# Patient Record
Sex: Female | Born: 1960 | Race: White | Hispanic: No | Smoking: Former smoker
Health system: Southern US, Community
[De-identification: ages and names within clinical notes are randomized; demographics above are authoritative.]

## PROBLEM LIST (undated history)

## (undated) DIAGNOSIS — T883XXA Malignant hyperthermia due to anesthesia, initial encounter: Secondary | ICD-10-CM

## (undated) DIAGNOSIS — Z803 Family history of malignant neoplasm of breast: Secondary | ICD-10-CM

## (undated) DIAGNOSIS — G7100 Muscular dystrophy, unspecified: Secondary | ICD-10-CM

## (undated) DIAGNOSIS — Z86718 Personal history of other venous thrombosis and embolism: Secondary | ICD-10-CM

## (undated) DIAGNOSIS — M1990 Unspecified osteoarthritis, unspecified site: Secondary | ICD-10-CM

## (undated) DIAGNOSIS — R918 Other nonspecific abnormal finding of lung field: Secondary | ICD-10-CM

## (undated) DIAGNOSIS — K219 Gastro-esophageal reflux disease without esophagitis: Secondary | ICD-10-CM

## (undated) DIAGNOSIS — F419 Anxiety disorder, unspecified: Secondary | ICD-10-CM

## (undated) DIAGNOSIS — N189 Chronic kidney disease, unspecified: Secondary | ICD-10-CM

## (undated) DIAGNOSIS — C3490 Malignant neoplasm of unspecified part of unspecified bronchus or lung: Secondary | ICD-10-CM

## (undated) HISTORY — PX: LITHOTRIPSY: SUR834

## (undated) HISTORY — DX: Malignant hyperthermia due to anesthesia, initial encounter: T88.3XXA

## (undated) HISTORY — DX: Gastro-esophageal reflux disease without esophagitis: K21.9

## (undated) HISTORY — DX: Muscular dystrophy, unspecified: G71.00

## (undated) HISTORY — DX: Anxiety disorder, unspecified: F41.9

## (undated) HISTORY — DX: Personal history of other venous thrombosis and embolism: Z86.718

## (undated) HISTORY — DX: Other nonspecific abnormal finding of lung field: R91.8

## (undated) HISTORY — DX: Unspecified osteoarthritis, unspecified site: M19.90

## (undated) HISTORY — DX: Family history of malignant neoplasm of breast: Z80.3

## (undated) HISTORY — DX: Chronic kidney disease, unspecified: N18.9

## (undated) HISTORY — DX: Malignant neoplasm of unspecified part of unspecified bronchus or lung: C34.90

---

## 1986-03-06 HISTORY — PX: TUBAL LIGATION: SHX77

## 2003-12-10 ENCOUNTER — Other Ambulatory Visit: Payer: Self-pay

## 2004-10-08 ENCOUNTER — Ambulatory Visit: Payer: Self-pay | Admitting: Unknown Physician Specialty

## 2005-08-25 ENCOUNTER — Ambulatory Visit: Payer: Self-pay | Admitting: Unknown Physician Specialty

## 2006-03-23 ENCOUNTER — Ambulatory Visit: Payer: Self-pay | Admitting: Unknown Physician Specialty

## 2006-04-01 ENCOUNTER — Inpatient Hospital Stay: Payer: Self-pay | Admitting: Unknown Physician Specialty

## 2006-04-15 ENCOUNTER — Emergency Department: Payer: Self-pay | Admitting: General Practice

## 2006-06-13 ENCOUNTER — Ambulatory Visit: Payer: Self-pay | Admitting: Unknown Physician Specialty

## 2007-02-22 ENCOUNTER — Ambulatory Visit: Payer: Self-pay | Admitting: Unknown Physician Specialty

## 2007-03-01 ENCOUNTER — Ambulatory Visit: Payer: Self-pay | Admitting: Internal Medicine

## 2007-04-16 ENCOUNTER — Emergency Department: Payer: Self-pay | Admitting: Emergency Medicine

## 2008-05-15 ENCOUNTER — Ambulatory Visit: Payer: Self-pay | Admitting: Unknown Physician Specialty

## 2009-01-02 ENCOUNTER — Emergency Department: Payer: Self-pay | Admitting: Emergency Medicine

## 2009-06-16 ENCOUNTER — Ambulatory Visit: Payer: Self-pay | Admitting: Unknown Physician Specialty

## 2009-07-30 ENCOUNTER — Ambulatory Visit: Payer: Self-pay | Admitting: Unknown Physician Specialty

## 2009-08-26 ENCOUNTER — Ambulatory Visit: Payer: Self-pay | Admitting: Unknown Physician Specialty

## 2009-09-02 ENCOUNTER — Ambulatory Visit: Payer: Self-pay | Admitting: Unknown Physician Specialty

## 2009-12-26 ENCOUNTER — Ambulatory Visit: Payer: Self-pay | Admitting: Unknown Physician Specialty

## 2010-01-02 ENCOUNTER — Ambulatory Visit: Payer: Self-pay | Admitting: Unknown Physician Specialty

## 2010-09-14 ENCOUNTER — Ambulatory Visit: Payer: Self-pay | Admitting: Unknown Physician Specialty

## 2010-10-12 ENCOUNTER — Ambulatory Visit: Payer: Self-pay | Admitting: Unknown Physician Specialty

## 2011-05-14 ENCOUNTER — Other Ambulatory Visit (INDEPENDENT_AMBULATORY_CARE_PROVIDER_SITE_OTHER): Payer: Self-pay | Admitting: Surgery

## 2011-05-14 DIAGNOSIS — E042 Nontoxic multinodular goiter: Secondary | ICD-10-CM

## 2011-06-16 ENCOUNTER — Other Ambulatory Visit: Payer: Self-pay

## 2011-06-23 ENCOUNTER — Other Ambulatory Visit: Payer: Self-pay

## 2011-06-30 ENCOUNTER — Ambulatory Visit
Admission: RE | Admit: 2011-06-30 | Discharge: 2011-06-30 | Disposition: A | Discharge status: Home / Self Care | Payer: Medicaid Other | Source: Ambulatory Visit | Attending: Surgery | Admitting: Surgery

## 2011-06-30 DIAGNOSIS — E042 Nontoxic multinodular goiter: Secondary | ICD-10-CM

## 2011-07-02 ENCOUNTER — Encounter (INDEPENDENT_AMBULATORY_CARE_PROVIDER_SITE_OTHER): Payer: Self-pay | Admitting: Surgery

## 2011-07-05 ENCOUNTER — Ambulatory Visit (INDEPENDENT_AMBULATORY_CARE_PROVIDER_SITE_OTHER): Payer: Medicare Other | Admitting: Surgery

## 2011-07-05 ENCOUNTER — Encounter (INDEPENDENT_AMBULATORY_CARE_PROVIDER_SITE_OTHER): Payer: Self-pay | Admitting: Surgery

## 2011-07-05 VITALS — BP 132/76 | HR 68 | Temp 97.1°F | Ht 62.5 in | Wt 125.4 lb

## 2011-07-05 DIAGNOSIS — E042 Nontoxic multinodular goiter: Secondary | ICD-10-CM | POA: Insufficient documentation

## 2011-07-05 DIAGNOSIS — E041 Nontoxic single thyroid nodule: Secondary | ICD-10-CM

## 2011-07-05 NOTE — Progress Notes (Signed)
HISTORY: Patient is a 50 year old female followed in my practice with bilateral thyroid nodules. She was last evaluated in December 2011. She is not on thyroid hormone suppression. She has had no previous biopsies. She is followed by her primary physician.  Thyroid ultrasound was performed at [facility] imaging on July 25. This study demonstrated stable bilateral thyroid nodules. The largest nodules measured only 13 mm in diameter. No nodules met criteria for biopsy.  Patient returns today for physical examination and review of ultrasound results.   PERTINENT REVIEW OF SYSTEMS: Patient denies any compressive symptoms. She denies tremor. She denies palpitation.   EXAM: HEENT shows her to be normocephalic. Sclerae clear. Pupils equal and reactive. Dentition good. Mucous membranes moist. Voice normal. Neck is symmetric with extension. Palpation reveals a nodular thyroid gland. The largest nodule on the right measures approximately 1 cm. No tenderness. No anterior or posterior cervical lymphadenopathy. No supraclavicular masses. Chest is clear to auscultation without rales rhonchi or wheeze Cardiac exam reveals regular rate and rhythm without significant murmur Extremities are nontender without edema. No tenderness. Neurologic exam shows her to be alert and oriented without obvious focal deficit. No sign of tremor.   IMPRESSION: Multinodular thyroid gland, largest nodule 13 mm, stable.   PLAN: The patient will continue observation. At this point she does not require thyroid hormone suppression or supplementation. I will see her back in one year. We will obtained a thyroid ultrasound and a TSH level prior to that office visit.

## 2012-03-29 ENCOUNTER — Ambulatory Visit: Payer: Self-pay | Admitting: Unknown Physician Specialty

## 2012-05-11 ENCOUNTER — Other Ambulatory Visit (INDEPENDENT_AMBULATORY_CARE_PROVIDER_SITE_OTHER): Payer: Self-pay

## 2012-05-11 DIAGNOSIS — E041 Nontoxic single thyroid nodule: Secondary | ICD-10-CM

## 2012-06-21 ENCOUNTER — Encounter (INDEPENDENT_AMBULATORY_CARE_PROVIDER_SITE_OTHER): Payer: Self-pay

## 2012-06-21 ENCOUNTER — Telehealth (INDEPENDENT_AMBULATORY_CARE_PROVIDER_SITE_OTHER): Payer: Self-pay

## 2012-06-21 NOTE — Telephone Encounter (Signed)
Unable to reach pt at phone # has been d/c. Reminder card mailed to pt. U/S and labs due prior to ov. Needs ov after date for tests.

## 2013-04-30 ENCOUNTER — Emergency Department: Payer: Self-pay | Admitting: Emergency Medicine

## 2013-04-30 LAB — CBC
HCT: 42.2 % (ref 35.0–47.0)
HGB: 14.5 g/dL (ref 12.0–16.0)
MCH: 32.9 pg (ref 26.0–34.0)
MCV: 96 fL (ref 80–100)
RBC: 4.4 10*6/uL (ref 3.80–5.20)
RDW: 12.9 % (ref 11.5–14.5)

## 2013-04-30 LAB — URINALYSIS, COMPLETE
Bacteria: NONE SEEN
Bilirubin,UR: NEGATIVE
Glucose,UR: NEGATIVE mg/dL (ref 0–75)
Ketone: NEGATIVE
Leukocyte Esterase: NEGATIVE
Nitrite: NEGATIVE
Ph: 5 (ref 4.5–8.0)
RBC,UR: 2 /HPF (ref 0–5)
Squamous Epithelial: 2
WBC UR: 1 /HPF (ref 0–5)

## 2013-04-30 LAB — PROTIME-INR
INR: 1.9
Prothrombin Time: 21.6 secs — ABNORMAL HIGH (ref 11.5–14.7)

## 2013-04-30 LAB — COMPREHENSIVE METABOLIC PANEL
Albumin: 3.6 g/dL (ref 3.4–5.0)
Alkaline Phosphatase: 90 U/L (ref 50–136)
Anion Gap: 4 — ABNORMAL LOW (ref 7–16)
BUN: 16 mg/dL (ref 7–18)
Calcium, Total: 8.9 mg/dL (ref 8.5–10.1)
Chloride: 105 mmol/L (ref 98–107)
Co2: 29 mmol/L (ref 21–32)
Creatinine: 0.38 mg/dL — ABNORMAL LOW (ref 0.60–1.30)
EGFR (African American): >60
EGFR (Non-African Amer.): >60
Glucose: 96 mg/dL (ref 65–99)
Potassium: 3.9 mmol/L (ref 3.5–5.1)
Sodium: 138 mmol/L (ref 136–145)

## 2013-05-06 DIAGNOSIS — R509 Fever, unspecified: Secondary | ICD-10-CM | POA: Insufficient documentation

## 2013-05-22 ENCOUNTER — Other Ambulatory Visit: Payer: Self-pay

## 2013-05-24 ENCOUNTER — Inpatient Hospital Stay: Payer: Self-pay | Admitting: Internal Medicine

## 2013-05-24 LAB — COMPREHENSIVE METABOLIC PANEL
BUN: 16 mg/dL (ref 7–18)
Bilirubin,Total: 0.4 mg/dL (ref 0.2–1.0)
Calcium, Total: 9.4 mg/dL (ref 8.5–10.1)
Chloride: 105 mmol/L (ref 98–107)
EGFR (African American): >60
EGFR (Non-African Amer.): >60
Glucose: 77 mg/dL (ref 65–99)
Potassium: 3.6 mmol/L (ref 3.5–5.1)
SGOT(AST): 20 U/L (ref 15–37)
Sodium: 139 mmol/L (ref 136–145)
Total Protein: 7.9 g/dL (ref 6.4–8.2)

## 2013-05-24 LAB — CBC WITH DIFFERENTIAL/PLATELET
Basophil #: 0 10*3/uL (ref 0.0–0.1)
Basophil %: 0.4 %
Eosinophil %: 0.4 %
HCT: 40.4 % (ref 35.0–47.0)
HGB: 13.7 g/dL (ref 12.0–16.0)
MCHC: 34 g/dL (ref 32.0–36.0)
Monocyte #: 0.6 x10 3/mm (ref 0.2–0.9)
Monocyte %: 6.6 %
Neutrophil #: 6.8 10*3/uL — ABNORMAL HIGH (ref 1.4–6.5)
Neutrophil %: 74.8 %
Platelet: 282 10*3/uL (ref 150–440)
RBC: 4.17 10*6/uL (ref 3.80–5.20)
WBC: 9.1 10*3/uL (ref 3.6–11.0)

## 2013-05-24 LAB — CK-MB: CK-MB: 1.4 ng/mL (ref 0.5–3.6)

## 2013-05-24 LAB — PROTIME-INR
INR: 2.2
Prothrombin Time: 24 secs — ABNORMAL HIGH (ref 11.5–14.7)

## 2013-05-24 LAB — TROPONIN I: Troponin-I: <0.02 ng/mL

## 2013-05-25 LAB — PROTIME-INR
INR: 2.6
Prothrombin Time: 27.3 secs — ABNORMAL HIGH (ref 11.5–14.7)

## 2013-05-25 LAB — CK-MB: CK-MB: 1.7 ng/mL (ref 0.5–3.6)

## 2013-05-25 LAB — TROPONIN I: Troponin-I: <0.02 ng/mL

## 2013-05-26 LAB — PROTIME-INR
INR: 2.5
Prothrombin Time: 26.4 secs — ABNORMAL HIGH (ref 11.5–14.7)

## 2013-05-28 LAB — CULTURE, BLOOD (SINGLE)

## 2013-05-30 LAB — CULTURE, BLOOD (SINGLE)

## 2013-05-31 ENCOUNTER — Ambulatory Visit: Payer: Self-pay

## 2013-11-07 ENCOUNTER — Emergency Department: Payer: Self-pay | Admitting: Emergency Medicine

## 2013-11-07 LAB — COMPREHENSIVE METABOLIC PANEL
Alkaline Phosphatase: 92 U/L
Anion Gap: 4 — ABNORMAL LOW (ref 7–16)
BUN: 21 mg/dL — ABNORMAL HIGH (ref 7–18)
Bilirubin,Total: 0.2 mg/dL (ref 0.2–1.0)
Calcium, Total: 9.3 mg/dL (ref 8.5–10.1)
Chloride: 107 mmol/L (ref 98–107)
Creatinine: 0.5 mg/dL — ABNORMAL LOW (ref 0.60–1.30)
EGFR (African American): >60
EGFR (Non-African Amer.): >60
Glucose: 107 mg/dL — ABNORMAL HIGH (ref 65–99)
Potassium: 4.2 mmol/L (ref 3.5–5.1)
SGOT(AST): 48 U/L — ABNORMAL HIGH (ref 15–37)

## 2013-11-07 LAB — CBC
HCT: 39.2 % (ref 35.0–47.0)
MCV: 96 fL (ref 80–100)

## 2013-11-07 LAB — URINALYSIS, COMPLETE
Bacteria: NONE SEEN
Bilirubin,UR: NEGATIVE
Ketone: NEGATIVE
Nitrite: NEGATIVE
Ph: 6 (ref 4.5–8.0)
RBC,UR: 607 /HPF (ref 0–5)
Specific Gravity: 1.023 (ref 1.003–1.030)

## 2014-05-31 DIAGNOSIS — F419 Anxiety disorder, unspecified: Secondary | ICD-10-CM | POA: Insufficient documentation

## 2014-05-31 DIAGNOSIS — F418 Other specified anxiety disorders: Secondary | ICD-10-CM | POA: Insufficient documentation

## 2014-05-31 DIAGNOSIS — K219 Gastro-esophageal reflux disease without esophagitis: Secondary | ICD-10-CM | POA: Insufficient documentation

## 2014-10-01 ENCOUNTER — Ambulatory Visit: Payer: Self-pay

## 2015-01-29 ENCOUNTER — Emergency Department: Payer: Self-pay | Admitting: Emergency Medicine

## 2015-02-24 ENCOUNTER — Ambulatory Visit: Payer: Self-pay | Admitting: Orthopedic Surgery

## 2015-03-28 NOTE — Discharge Summary (Signed)
PATIENT NAME:  Deborah Nguyen, Deborah Nguyen MR#:  [id number] DATE OF BIRTH:  1961/07/28  DATE OF ADMISSION:  05/24/2013 DATE OF DISCHARGE:  05/26/2013  DISCHARGE DIAGNOSES: 1.  Bacteremia, Staphylococcus. 2.  Fever unknown origin.   DISCHARGE MEDICATIONS: Per [facility] med reconciliation system, basically her home meds.   HISTORY AND PHYSICAL:  Please see detailed history and physical done on admission.   HOSPITAL COURSE: Admitted with fever, status post extensive workup. Culture was done that showed Staphylococcus which prompted Dr. Ola Spurr to admit her. Further workup with MRI of her C and T-spine were negative, as far as diskitis. Echocardiogram did not show evidence for endocarditis. Further blood cultures were negative and the cultures ultimately grew Staph epidermis, so it is unclear if this is a true bacteremia. Dr. Ola Spurr thought given that in that her fevers were normal, she could be discharged with the negative workup, which she has had. The patient is agreeable with that.  She agrees to see him on Monday with today being Saturday. She agrees to call with any further difficulty. Her INR is therapeutic at 2.5 this morning on her usual dose of warfarin.   TIME SPENT: It took approximately 34 minutes to do her discharge tasks today, including evaluating the patient, filling out her discharge forms, discussing with the patient, reviewing full records and Dr. Blane Ohara weekend instructions, etc.   ____________________________ Ocie Cornfield. Ouida Sills, MD mwa:ce D: 05/26/2013 10:16:04 ET T: 05/26/2013 10:59:17 ET JOB#: [id number]  cc: Ocie Cornfield. Ouida Sills, MD, <Dictator> Kirk Ruths MD ELECTRONICALLY SIGNED 05/27/2013 9:25

## 2015-03-28 NOTE — H&P (Signed)
PATIENT NAME:  Deborah Nguyen, Deborah Nguyen MR#:  [id number] DATE OF BIRTH:  10/29/1961  DATE OF ADMISSION:  05/24/2013  PRIMARY CARE PHYSICIAN: Boykin Reaper.   REASON FOR ADMISSION: Bacteremia and fever of unknown origin.   HISTORY OF PRESENT ILLNESS: This is a 54 year old relatively healthy female who was referred to infectious disease consult, seen 2 days ago for fevers of unknown origin. Blood cultures were done at that time and have been growing gram-positive cocci in clusters. She is admitted for further treatment and evaluation of this.   The patient has been ill for over a month. She first began with some abdominal pain and then a sore in her mouth, which blistered and burst. She then gradually felt weak, fatigued, tremors, sweating at night and fevers up to 103. She has been seen in urgent care and was initially treated for possible diverticulitis in early May, however, did not tolerate the antibiotics she was on, so that was stopped. She did have a CT scan of her abdomen, which showed no diverticulitis but did show some constipation. She then did restart doxycycline on June 6th for a 10 day course. She has had an extensive otherwise workup otherwise for her FUOs.   We advised the patient to come to clinic today to be admitted. She reports continued fevers, has been checking it every day, and it is up to 100.4; very tremulous and weak and feels achy all over. She also reports some neck pain on the middle of her C-spine.   PAST MEDICAL HISTORY: 1.  Muscular dystrophy.  2.  Hepatitis A.  3.  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  4.  Thyroid nodules.  5.  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  6.  C-section.  7.  Lithotripsy.  8.  Positive ANA with negative workup in the past.   SOCIAL HISTORY: The patient has a son and grandchildren. She smokes 1 pack a day. She does not drink alcohol.   FAMILY HISTORY:  Positive for rheumatoid arthritis, breast cancer,  melanoma, diabetes and hypertension. She also has a family history of muscular dystrophy, and her sister has it as well.   REVIEW OF SYSTEMS:  Eleven systems reviewed and negative except as per HPI.   MEDICATIONS: Currently she remains on:  1.  Flonase.  2.  Lasix 20 mg once a day,  3.  Metoprolol 25 mg once a day.  4.  Nexium 40 mg once a day.  5.  Tramadol 50 mg 1 to 2 tablets every 6 hours as needed.  6.  Warfarin as directed.  7.  Xanax 0.25 mg as needed.   ALLERGIES: BIAXIN, CIPRO, ERYTHROMYCIN, LEVOFLOXACIN, PAXIL, PENICILLIN AND SERZONE.   PHYSICAL EXAMINATION:  VITAL SIGNS:  Patient's temperature is 99.1, pulse of 85, blood pressure 120/70.  GENERAL:  She is frail appearing and somewhat tremulous. She is quite anxious.  HEENT:  Pupils equal, round and reactive to light and accommodation. Extraocular movements are intact. Sclera anicteric. Oropharynx is clear.  NECK:  Supple with some mild tenderness to palpation over her trapezius muscles.  HEART:  Tachy but regular. No murmurs heard.  LUNGS:   Clear with no wheeze.  ABDOMEN:  Soft, nontender, nondistended. No hepatosplenomegaly.  EXTREMITIES:  No clubbing, cyanosis, or edema.  SKIN:  No rash. No peripheral stigmata of endocarditis.  NEUROLOGIC:  She is alert and oriented x3. Grossly nonfocal neuro exam. Cranial nerves II through XII intact.  PSYCHIATRIC: She is quite anxious.  LABORATORY, DIAGNOSTIC AND RADIOLOGICAL DATA:  Blood cultures pending with 1 set at least growing gram-positive cocci in clusters. Strep test on June 17th negative, ANA negative. Urine culture negative. Rheumatoid factor negative. RPR negative. HIV negative. Urinalysis: Small blood, 1 to 3 red cells, no white cells. Serology for coxsackievirus showed positive IgG but negative IgM. Parvovirus was negative IgG and IgM. CMV positive IgG, negative IgM. Creatinine kinase was 40. Last INR done 06/12 was 2.2. Lyme antibodies negative. June 6th, [place]  spotted fever IgG and IgM negative. Sed rate, June 6th, 34. Metabolic panel: Normal. Creatinine 0.4. LFTs normal. White count, 06/06, was 5.4, hemoglobin 13.7, platelets 248. Amylase and lipase were normal at that time. Chest x-ray, done 06/17, is pending. Abdominal x-ray, done 06/06, showed nonspecific bowel gas pattern with some constipation. CAT scan of the abdomen done in the ER showed constipation, but otherwise normal.   IMPRESSION: A 54 year old previously relatively healthy woman now with a fever of unknown origin for 1 month. Workup has been negative with serologies; however, she does have positive blood cultures. She also has some neck pain. I suspect endocarditis.   PLAN: 1.  Admit for further IV antibiotics and workup.  2.  Blood cultures x2 on admission.  3.  Start vancomycin once blood cultures are done.  4.  Order echocardiogram.  5.  We will check a CBC, sed rate, and a basic metabolic panel as well.  6.  We will consider cardiology consult for TEE, depending on what the cultures reveal and the echo reveals.  7.  Atrial fibrillation. The patient's heart rate is relatively well controlled. We will continue her on her outpatient medications including the metoprolol and her warfarin.  8.  Deep vein thrombosis prophylaxis. She remains on Coumadin.  9.  GI prophylaxis. She remains on Nexium.   TIME SPENT:  This admission took 40 minutes.    ____________________________ Cheral Marker. Ola Spurr, MD dpf:dmm D: 05/24/2013 12:02:25 ET T: 05/24/2013 12:13:42 ET JOB#: [id number]  cc: Cheral Marker. Ola Spurr, MD, <Dictator> Sanita Estrada Ola Spurr MD ELECTRONICALLY SIGNED 06/06/2013 21:55

## 2015-05-23 ENCOUNTER — Other Ambulatory Visit: Payer: Self-pay | Admitting: Internal Medicine

## 2015-05-23 DIAGNOSIS — Z86718 Personal history of other venous thrombosis and embolism: Secondary | ICD-10-CM | POA: Insufficient documentation

## 2015-05-23 DIAGNOSIS — Z1231 Encounter for screening mammogram for malignant neoplasm of breast: Secondary | ICD-10-CM

## 2015-05-27 ENCOUNTER — Ambulatory Visit: Payer: Medicaid Other | Attending: Internal Medicine

## 2015-07-01 ENCOUNTER — Ambulatory Visit: Payer: Self-pay

## 2015-07-01 ENCOUNTER — Encounter: Payer: Self-pay | Admitting: Urology

## 2015-08-01 DIAGNOSIS — Z86010 Personal history of colonic polyps: Secondary | ICD-10-CM | POA: Insufficient documentation

## 2015-09-15 ENCOUNTER — Encounter: Payer: Self-pay | Admitting: Anesthesiology

## 2015-09-15 ENCOUNTER — Encounter: Admission: RE | Payer: Self-pay | Source: Ambulatory Visit

## 2015-09-15 ENCOUNTER — Ambulatory Visit
Admission: RE | Admit: 2015-09-15 | Payer: Medicare Other | Source: Ambulatory Visit | Admitting: Unknown Physician Specialty

## 2015-09-15 SURGERY — COLONOSCOPY
Anesthesia: Moderate Sedation

## 2015-10-27 ENCOUNTER — Ambulatory Visit: Payer: Medicare Other | Admitting: Physician Assistant

## 2015-10-27 ENCOUNTER — Ambulatory Visit: Payer: Medicare Other | Admitting: Certified Registered"

## 2015-10-27 ENCOUNTER — Encounter: Payer: Self-pay | Admitting: *Deleted

## 2015-10-27 ENCOUNTER — Encounter
Admission: RE | Disposition: A | Discharge status: Home Health | Payer: Self-pay | Source: Ambulatory Visit | Attending: Unknown Physician Specialty

## 2015-10-27 ENCOUNTER — Ambulatory Visit
Admission: RE | Admit: 2015-10-27 | Discharge: 2015-10-27 | Disposition: A | Discharge status: Home Health | Payer: Medicare Other | Source: Ambulatory Visit | Attending: Unknown Physician Specialty | Admitting: Unknown Physician Specialty

## 2015-10-27 DIAGNOSIS — K219 Gastro-esophageal reflux disease without esophagitis: Secondary | ICD-10-CM | POA: Diagnosis not present

## 2015-10-27 DIAGNOSIS — K621 Rectal polyp: Secondary | ICD-10-CM | POA: Insufficient documentation

## 2015-10-27 DIAGNOSIS — N189 Chronic kidney disease, unspecified: Secondary | ICD-10-CM | POA: Diagnosis not present

## 2015-10-27 DIAGNOSIS — Z9889 Other specified postprocedural states: Secondary | ICD-10-CM | POA: Insufficient documentation

## 2015-10-27 DIAGNOSIS — D125 Benign neoplasm of sigmoid colon: Secondary | ICD-10-CM | POA: Insufficient documentation

## 2015-10-27 DIAGNOSIS — Z7951 Long term (current) use of inhaled steroids: Secondary | ICD-10-CM | POA: Diagnosis not present

## 2015-10-27 DIAGNOSIS — F419 Anxiety disorder, unspecified: Secondary | ICD-10-CM | POA: Insufficient documentation

## 2015-10-27 DIAGNOSIS — M1990 Unspecified osteoarthritis, unspecified site: Secondary | ICD-10-CM | POA: Diagnosis not present

## 2015-10-27 DIAGNOSIS — Z86010 Personal history of colonic polyps: Secondary | ICD-10-CM | POA: Insufficient documentation

## 2015-10-27 DIAGNOSIS — Z79899 Other long term (current) drug therapy: Secondary | ICD-10-CM | POA: Diagnosis not present

## 2015-10-27 DIAGNOSIS — Z880 Allergy status to penicillin: Secondary | ICD-10-CM | POA: Diagnosis not present

## 2015-10-27 DIAGNOSIS — Z7901 Long term (current) use of anticoagulants: Secondary | ICD-10-CM | POA: Insufficient documentation

## 2015-10-27 DIAGNOSIS — F17210 Nicotine dependence, cigarettes, uncomplicated: Secondary | ICD-10-CM | POA: Insufficient documentation

## 2015-10-27 DIAGNOSIS — D123 Benign neoplasm of transverse colon: Secondary | ICD-10-CM | POA: Insufficient documentation

## 2015-10-27 DIAGNOSIS — Z881 Allergy status to other antibiotic agents status: Secondary | ICD-10-CM | POA: Insufficient documentation

## 2015-10-27 DIAGNOSIS — Z86718 Personal history of other venous thrombosis and embolism: Secondary | ICD-10-CM | POA: Insufficient documentation

## 2015-10-27 DIAGNOSIS — Z888 Allergy status to other drugs, medicaments and biological substances status: Secondary | ICD-10-CM | POA: Insufficient documentation

## 2015-10-27 DIAGNOSIS — G710 Muscular dystrophy: Secondary | ICD-10-CM | POA: Diagnosis not present

## 2015-10-27 DIAGNOSIS — Z803 Family history of malignant neoplasm of breast: Secondary | ICD-10-CM | POA: Insufficient documentation

## 2015-10-27 HISTORY — PX: COLONOSCOPY: SHX5424

## 2015-10-27 SURGERY — COLONOSCOPY
Anesthesia: General

## 2015-10-27 MED ORDER — PROPOFOL 500 MG/50ML IV EMUL
INTRAVENOUS | Status: DC | PRN
  Administered 2015-10-27: 150 ug/kg/min via INTRAVENOUS

## 2015-10-27 MED ORDER — PROPOFOL 10 MG/ML IV BOLUS
INTRAVENOUS | Status: DC | PRN
  Administered 2015-10-27: 50 mg via INTRAVENOUS

## 2015-10-27 MED ORDER — SODIUM CHLORIDE 0.9 % IV SOLN: INTRAVENOUS | Status: DC

## 2015-10-27 MED ORDER — SODIUM CHLORIDE 0.9 % IV SOLN
INTRAVENOUS | Status: DC
  Administered 2015-10-27: 15:00:00 via INTRAVENOUS

## 2015-10-27 MED ORDER — LIDOCAINE HCL (CARDIAC) 20 MG/ML IV SOLN
INTRAVENOUS | Status: DC | PRN
  Administered 2015-10-27: 60 mg via INTRAVENOUS

## 2015-10-27 MED ORDER — PHENYLEPHRINE HCL 10 MG/ML IJ SOLN
INTRAMUSCULAR | Status: DC | PRN
  Administered 2015-10-27: 100 ug via INTRAVENOUS

## 2015-10-27 NOTE — Transfer of Care (Signed)
Immediate Anesthesia Transfer of Care Note  Patient: Deborah Nguyen  Procedure(s) Performed: Procedure(s) with comments: COLONOSCOPY (N/A) -  NO Propofol - per office  Patient Location: Endoscopy Unit  Anesthesia Type:General  Level of Consciousness: awake, alert , oriented and patient cooperative  Airway & Oxygen Therapy: Patient Spontanous Breathing and Patient connected to nasal cannula oxygen  Post-op Assessment: Report given to RN, Post -op Vital signs reviewed and stable and Patient moving all extremities X 4  Post vital signs: Reviewed and stable  Last Vitals:  Filed Vitals:   10/27/15 1430  BP: 132/77  Pulse: 73  Temp: 36.6 C  Resp: 20    Complications: No apparent anesthesia complications

## 2015-10-27 NOTE — Anesthesia Postprocedure Evaluation (Signed)
Anesthesia Post Note  Patient: Deborah Nguyen  Procedure(s) Performed: Procedure(s) (LRB): COLONOSCOPY (N/A)  Patient location during evaluation: Endoscopy Anesthesia Type: General Level of consciousness: awake and alert and oriented Pain management: satisfactory to patient Vital Signs Assessment: post-procedure vital signs reviewed and stable Respiratory status: spontaneous breathing Cardiovascular status: stable Postop Assessment: No headache Anesthetic complications: no    Last Vitals:  Filed Vitals:   10/27/15 1615 10/27/15 1630  BP: 134/62 103/91  Pulse: 115 58  Temp:    Resp: 21 21    Last Pain: There were no vitals filed for this visit.               Sonny Anthes S

## 2015-10-27 NOTE — Anesthesia Preprocedure Evaluation (Signed)
Anesthesia Evaluation  Patient identified by MRN, date of birth, ID band Patient awake    Reviewed: Allergy & Precautions, NPO status , Patient's Chart, lab work & pertinent test results  Airway Mallampati: I  TM Distance: >3 FB Neck ROM: Full    Dental  (+) Teeth Intact   Pulmonary Current Smoker,    Pulmonary exam normal        Cardiovascular Exercise Tolerance: Good Normal cardiovascular exam     Neuro/Psych Anxiety Fm Hx of central core disease--her sister had a cardiac arrest during anesthesia, and was tested. Central core disease shares many features with muscular dystrophy and these patients are given a trigger free anesthetic. Propofol for a GI procedure will be safe.    GI/Hepatic GERD  Medicated and Controlled,  Endo/Other    Renal/GU      Musculoskeletal  (+) Arthritis , Osteoarthritis,    Abdominal Normal abdominal exam  (+)   Peds  Hematology   Anesthesia Other Findings   Reproductive/Obstetrics                             Anesthesia Physical Anesthesia Plan  ASA: II  Anesthesia Plan: General   Post-op Pain Management:    Induction: Intravenous  Airway Management Planned: Nasal Cannula  Additional Equipment:   Intra-op Plan:   Post-operative Plan:   Informed Consent: I have reviewed the patients History and Physical, chart, labs and discussed the procedure including the risks, benefits and alternatives for the proposed anesthesia with the patient or authorized representative who has indicated his/her understanding and acceptance.     Plan Discussed with: CRNA  Anesthesia Plan Comments: (Propofol only, as discussed.)        Anesthesia Quick Evaluation

## 2015-10-27 NOTE — H&P (Signed)
Primary Care Physician:  Lorenza Evangelist, MD Primary Gastroenterologist:  Dr. Vira Agar  Pre-Procedure History & Physical: HPI:  Deborah Nguyen is a 54 y.o. female is here for an colonoscopy.   Past Medical History  Diagnosis Date  . GERD (gastroesophageal reflux disease)   . Chronic kidney disease   . Arthritis   . 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 Malignant hyperthermia   . Family history of breast cancer     aunt  . Anxiety   . History of DVT of lower extremity     Left leg    Past Surgical History  Procedure Laterality Date  . Cesarean section  11/1985  . Tubal ligation  03/1986  . Lithotripsy      Prior to Admission medications   Medication Sig Start Date End Date Taking? Authorizing Provider  ALPRAZolam Duanne Moron) 0.25 MG tablet Take 0.25 mg by mouth at bedtime as needed for anxiety.    Historical Provider, MD  esomeprazole (NEXIUM) 40 MG capsule Take 40 mg by mouth daily at 12 noon.    Historical Provider, MD  fluticasone (FLONASE) 50 MCG/ACT nasal spray Place 2 sprays into both nostrils daily.    Historical Provider, MD  furosemide (LASIX) 20 MG tablet Take 20 mg by mouth.    Historical Provider, MD  metoprolol succinate (TOPROL-XL) 12.5 mg TB24 Take 12.5 mg by mouth daily.      Historical Provider, MD  multivitamin [facility]) per tablet Take 1 tablet by mouth daily.      Historical Provider, MD  warfarin (COUMADIN) 5 MG tablet Take 5 mg by mouth daily. On Monday '6mg'$  daily     Historical Provider, MD    Allergies as of 09/26/2015 - Review Complete 09/12/2015  Allergen Reaction Noted  . Biaxin [clarithromycin] Nausea Only 06/30/2015  . Ciprofloxacin  06/30/2015  . Erythromycin Nausea Only 06/30/2015  . Levaquin [levofloxacin]  06/30/2015  . Paxil [paroxetine hcl]  06/30/2015  . Serzone [nefazodone]  06/30/2015  . Penicillins Rash 06/30/2015    Family History  Problem Relation Age of Onset  . Cancer Father    bladder, lung    Social History   Social History  . Marital Status: Divorced    Spouse Name: N/A  . Number of Children: N/A  . Years of Education: N/A   Occupational History  . Not on file.   Social History Main Topics  . Smoking status: Current Every Day Smoker -- 1.00 packs/day    Types: Cigarettes  . Smokeless tobacco: Current User  . Alcohol Use: No  . Drug Use: No  . Sexual Activity: Not on file   Other Topics Concern  . Not on file   Social History Narrative    Review of Systems: See HPI, otherwise negative ROS  Physical Exam: BP 132/77 mmHg  Pulse 73  Temp(Src) 97.8 F (36.6 C) (Tympanic)  Resp 20  Ht '5\' 2"'$  (1.575 m)  Wt 56.7 kg (125 lb)  BMI 22.86 kg/m2  SpO2 100%  LMP 12/15/2010 (Approximate) General:   Alert,  pleasant and cooperative in NAD Head:  Normocephalic and atraumatic. Neck:  Supple; no masses or thyromegaly. Lungs:  Clear throughout to auscultation.    Heart:  Regular rate and rhythm. Abdomen:  Soft, nontender and nondistended. Normal bowel sounds, without guarding, and without rebound.   Neurologic:  Alert and  oriented x4;  grossly normal neurologically.  Impression/Plan: Janus Molder  is here for an colonoscopy to be performed for [facility] colon polyps  Risks, benefits, limitations, and alternatives regarding  colonoscopy have been reviewed with the patient.  Questions have been answered.  All parties agreeable.   Gaylyn Cheers, MD  10/27/2015, 3:17 PM

## 2015-10-27 NOTE — Op Note (Signed)
[facility] Gastroenterology Patient Name: Deborah Nguyen Procedure Date: 10/27/2015 3:17 PM MRN: [id number] Account #: [id number] Date of Birth: 10/20/1961 Admit Type: Outpatient Age: 54 Room: [facility] ENDO ROOM 1 Gender: Female Note Status: Finalized Procedure:         Colonoscopy Indications:       High risk colon cancer surveillance: Personal history of                     colonic polyps Providers:         Manya Silvas, MD Referring MD:      Tracie Harrier, MD (Referring MD) Medicines:         Propofol per Anesthesia Complications:     No immediate complications. Procedure:         Pre-Anesthesia Assessment:                    - After reviewing the risks and benefits, the patient was                     deemed in satisfactory condition to undergo the procedure.                    After obtaining informed consent, the colonoscope was                     passed under direct vision. Throughout the procedure, the                     patient's blood pressure, pulse, and oxygen saturations                     were monitored continuously. The Colonoscope was                     introduced through the anus and advanced to the the cecum,                     identified by appendiceal orifice and ileocecal valve. The                     colonoscopy was somewhat difficult due to restricted                     mobility of the colon. Successful completion of the                     procedure was aided by applying abdominal pressure. The                     patient tolerated the procedure well. The quality of the                     bowel preparation was excellent. Findings:      A small polyp was found at the hepatic flexure. The polyp was sessile.       The polyp was removed with a hot snare. Resection and retrieval were       complete.      A diminutive polyp was found in the sigmoid colon. The polyp was       sessile. The polyp was removed with a jumbo cold  forceps. Resection and       retrieval were complete.      A diminutive polyp was found in the  rectum. The polyp was sessile. The       polyp was removed with a jumbo cold forceps. Resection and retrieval       were complete. Impression:        - One small polyp at the hepatic flexure. Resected and                     retrieved.                    - One diminutive polyp in the sigmoid colon. Resected and                     retrieved.                    - One diminutive polyp in the rectum. Resected and                     retrieved. Recommendation:    - Await pathology results. Manya Silvas, MD 10/27/2015 3:48:56 PM This report has been signed electronically. Number of Addenda: 0 Note Initiated On: 10/27/2015 3:17 PM Scope Withdrawal Time: 0 hours 14 minutes 18 seconds  Total Procedure Duration: 0 hours 21 minutes 30 seconds       [facility]

## 2015-10-28 ENCOUNTER — Encounter: Payer: Self-pay | Admitting: Unknown Physician Specialty

## 2015-10-29 LAB — SURGICAL PATHOLOGY

## 2016-03-05 ENCOUNTER — Ambulatory Visit
Admission: RE | Admit: 2016-03-05 | Discharge: 2016-03-05 | Disposition: A | Discharge status: Home / Self Care | Payer: Medicare Other | Source: Ambulatory Visit | Attending: Internal Medicine | Admitting: Internal Medicine

## 2016-03-05 DIAGNOSIS — Z1231 Encounter for screening mammogram for malignant neoplasm of breast: Secondary | ICD-10-CM | POA: Diagnosis present

## 2016-09-17 DIAGNOSIS — G7129 Other congenital myopathy: Secondary | ICD-10-CM | POA: Insufficient documentation

## 2016-09-17 DIAGNOSIS — R7989 Other specified abnormal findings of blood chemistry: Secondary | ICD-10-CM | POA: Insufficient documentation

## 2017-01-24 DIAGNOSIS — E0590 Thyrotoxicosis, unspecified without thyrotoxic crisis or storm: Secondary | ICD-10-CM | POA: Insufficient documentation

## 2017-03-21 ENCOUNTER — Other Ambulatory Visit: Payer: Self-pay | Admitting: Internal Medicine

## 2017-03-21 DIAGNOSIS — R51 Headache: Principal | ICD-10-CM

## 2017-03-21 DIAGNOSIS — R519 Headache, unspecified: Secondary | ICD-10-CM

## 2017-03-21 DIAGNOSIS — G7100 Muscular dystrophy, unspecified: Secondary | ICD-10-CM

## 2017-05-16 ENCOUNTER — Other Ambulatory Visit: Payer: Self-pay | Admitting: Neurology

## 2017-05-16 DIAGNOSIS — R251 Tremor, unspecified: Secondary | ICD-10-CM

## 2017-05-16 DIAGNOSIS — R404 Transient alteration of awareness: Secondary | ICD-10-CM

## 2017-05-25 ENCOUNTER — Ambulatory Visit
Admission: RE | Admit: 2017-05-25 | Discharge: 2017-05-25 | Disposition: A | Discharge status: Home / Self Care | Payer: Medicare Other | Source: Ambulatory Visit | Attending: Neurology | Admitting: Neurology

## 2017-05-25 DIAGNOSIS — R251 Tremor, unspecified: Secondary | ICD-10-CM | POA: Insufficient documentation

## 2017-05-25 DIAGNOSIS — R404 Transient alteration of awareness: Secondary | ICD-10-CM

## 2017-05-25 DIAGNOSIS — R9082 White matter disease, unspecified: Secondary | ICD-10-CM | POA: Insufficient documentation

## 2017-05-25 MED ORDER — GADOBENATE DIMEGLUMINE 529 MG/ML IV SOLN
10.0000 mL | Freq: Once | INTRAVENOUS | Status: AC | PRN
  Administered 2017-05-25: 10 mL via INTRAVENOUS

## 2017-06-14 ENCOUNTER — Encounter: Payer: Self-pay | Admitting: Emergency Medicine

## 2017-06-14 ENCOUNTER — Emergency Department
Admission: EM | Admit: 2017-06-14 | Discharge: 2017-06-14 | Disposition: A | Discharge status: Home / Self Care | Payer: Medicare Other | Attending: Emergency Medicine | Admitting: Emergency Medicine

## 2017-06-14 DIAGNOSIS — R509 Fever, unspecified: Secondary | ICD-10-CM | POA: Diagnosis not present

## 2017-06-14 DIAGNOSIS — Z79899 Other long term (current) drug therapy: Secondary | ICD-10-CM | POA: Insufficient documentation

## 2017-06-14 DIAGNOSIS — F17210 Nicotine dependence, cigarettes, uncomplicated: Secondary | ICD-10-CM | POA: Insufficient documentation

## 2017-06-14 DIAGNOSIS — Z7901 Long term (current) use of anticoagulants: Secondary | ICD-10-CM | POA: Insufficient documentation

## 2017-06-14 DIAGNOSIS — R51 Headache: Secondary | ICD-10-CM | POA: Diagnosis present

## 2017-06-14 DIAGNOSIS — N189 Chronic kidney disease, unspecified: Secondary | ICD-10-CM | POA: Diagnosis not present

## 2017-06-14 LAB — CBC
HCT: 42.8 % (ref 35.0–47.0)
HEMOGLOBIN: 14.6 g/dL (ref 12.0–16.0)
MCH: 32.6 pg (ref 26.0–34.0)
MCHC: 34.2 g/dL (ref 32.0–36.0)
MCV: 95.4 fL (ref 80.0–100.0)
PLATELETS: 258 10*3/uL (ref 150–440)
RBC: 4.49 MIL/uL (ref 3.80–5.20)
RDW: 13.1 % (ref 11.5–14.5)
WBC: 7 10*3/uL (ref 3.6–11.0)

## 2017-06-14 LAB — CK: CK TOTAL: 63 U/L (ref 38–234)

## 2017-06-14 LAB — BASIC METABOLIC PANEL
Anion gap: 9 (ref 5–15)
BUN: 14 mg/dL (ref 6–20)
CHLORIDE: 103 mmol/L (ref 101–111)
CO2: 25 mmol/L (ref 22–32)
CREATININE: 0.43 mg/dL — AB (ref 0.44–1.00)
Calcium: 9.2 mg/dL (ref 8.9–10.3)
GFR calc Af Amer: >60 mL/min (ref >60)
GFR calc non Af Amer: >60 mL/min (ref >60)
GLUCOSE: 89 mg/dL (ref 65–99)
POTASSIUM: 4.2 mmol/L (ref 3.5–5.1)
SODIUM: 137 mmol/L (ref 135–145)

## 2017-06-14 MED ORDER — SODIUM CHLORIDE 0.9 % IV BOLUS (SEPSIS)
1000.0000 mL | Freq: Once | INTRAVENOUS | Status: DC
Start: 2017-06-14 — End: 2017-06-15

## 2017-06-14 MED ORDER — ACETAMINOPHEN 325 MG PO TABS
650.0000 mg | ORAL_TABLET | Freq: Once | ORAL | Status: AC
  Administered 2017-06-14: 650 mg via ORAL
  Filled 2017-06-14: qty 2

## 2017-06-14 NOTE — Discharge Instructions (Signed)
It is not clear to me why you  have had recurrent fevers, I think this is going to involve further workup at [facility]. You  would prefer not to have any further workup in the emergency department at this time and be discharged. This is certainly your choice but does limit as we discussed our ability to evaluate you. Please return to the emergency room if you have any concerning symptoms and follow closely with her doctor and with rheumatology as an outpatient.

## 2017-06-14 NOTE — ED Notes (Signed)
Pt refused blood work  

## 2017-06-14 NOTE — ED Provider Notes (Signed)
[facility] Emergency Department Provider Note  ____________________________________________   I have reviewed the triage vital signs and the nursing notes.   HISTORY  Chief Complaint Headache    HPI Deborah Nguyen is a 56 y.o. female who has a history of PE and DVT, on Coumadin, history of central cord disease which her sister and her son also has, and a 4 year history of recurrent fevers. She gets fevers 3 or 4 times a week sometimes, associated with feeling just generally unwell, the last until she takes an antipyretic and then they stop and go away. She has been the infectious disease for this, she has been to her primary care doctor for this, she has been to neurology for this, there is been no exhalation for why this is happening she is getting frustrated. She thought she would come in today and see if I can help. She literally has had this for 4 years. On a regular basis. The patient has had no infectious pathology identified by infectious disease. It is not clear why she has E symptoms. She does not feel an different today than normal. She denies any focal infectious pathology such as dysuria or urinary frequency URI symptoms she denies any headache she states her head just feels "full" when these things happen as it always does. She denies any rash she denies any recent travel she denies any travel to malaria endemic areas she denies any abdominal pain vomiting or any other symptoms aside from these recurrent fevers. She states that they sometimes are brought on by exertion. It is not unusual for her to have a fever over 102 with these symptoms which again happen several times a week.      Past Medical History:  Diagnosis Date  . Anxiety   . Arthritis   . Chronic kidney disease   . Family history of breast cancer    aunt  . GERD (gastroesophageal reflux disease)   . History of DVT of lower extremity    Left leg  . Malignant hyperthermia   . 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 CESAREAN SECTION  11/1985  . COLONOSCOPY N/A 10/27/2015   Procedure: COLONOSCOPY;  Surgeon: Manya Silvas, MD;  Location: [facility] ENDOSCOPY;  Service: Endoscopy;  Laterality: N/A;   NO Propofol - per office  . LITHOTRIPSY    . TUBAL LIGATION  03/1986    Prior to Admission medications   Medication Sig Start Date End Date Taking? Authorizing Provider  ALPRAZolam Duanne Moron) 0.25 MG tablet Take 0.25 mg by mouth at bedtime as needed for anxiety.    [provider]  esomeprazole (NEXIUM) 40 MG capsule Take 40 mg by mouth daily at 12 noon.    [provider]  fluticasone (FLONASE) 50 MCG/ACT nasal spray Place 2 sprays into both nostrils daily.    [provider]  furosemide (LASIX) 20 MG tablet Take 20 mg by mouth.    [provider]  metoprolol succinate (TOPROL-XL) 12.5 mg TB24 Take 12.5 mg by mouth daily.      [provider]  multivitamin [facility]) per tablet Take 1 tablet by mouth daily.      [provider]  warfarin (COUMADIN) 5 MG tablet Take 5 mg by mouth daily.  On Monday 6mg  daily     [provider]    Allergies Biaxin [clarithromycin]; Ciprofloxacin; Erythromycin; Levaquin [levofloxacin]; Paxil [paroxetine hcl]; Serzone [nefazodone]; and Penicillins  Family History  Problem Relation Age of Onset  . Cancer Father        bladder, lung  . Breast cancer Maternal Aunt 58    Social History Social History  Substance Use Topics  . Smoking status: Current Every Day Smoker    Packs/day: 1.00    Types: Cigarettes  . Smokeless tobacco: Current User  . Alcohol use No    Review of Systems Constitutional: Positive fever/chills Eyes: No visual changes. ENT: No sore throat. No stiff neck no  neck pain Cardiovascular: Denies chest pain. Respiratory: Denies shortness of breath. Gastrointestinal:   no vomiting.  No diarrhea.  No constipation. Genitourinary: Negative for dysuria. Musculoskeletal: Negative lower extremity swelling Skin: Negative for rash. Neurological: Negative for severe headaches, focal weakness or numbness.   ____________________________________________   PHYSICAL EXAM:  VITAL SIGNS: ED Triage Vitals  Enc Vitals Group     BP 06/14/17 1928 (!) 157/75     Pulse Rate 06/14/17 1928 85     Resp 06/14/17 1928 18     Temp 06/14/17 1928 (!) 102.9 F (39.4 C)     Temp Source 06/14/17 1928 Oral     SpO2 06/14/17 1928 97 %     Weight 06/14/17 1929 128 lb (58.1 kg)     Height 06/14/17 1929 5\' 2"  (1.575 m)     Head Circumference --      Peak Flow --      Pain Score 06/14/17 1928 3     Pain Loc --      Pain Edu? --      Excl. in [place]? --     Constitutional: Alert and oriented. Well appearing and in no acute distress. Eyes: Conjunctivae are normal Head: Atraumatic HEENT: No congestion/rhinnorhea. Mucous membranes are moist.  Oropharynx non-erythematous Neck:   Nontender with no meningismus, no masses, no stridor Cardiovascular: Normal rate, regular rhythm. Grossly normal heart sounds.  Good peripheral circulation. Respiratory: Normal respiratory effort.  No retractions. Lungs CTAB. Abdominal: Soft and nontender. No distention. No guarding no rebound Back:  There is no focal tenderness or step off.  there is no midline tenderness there are no lesions noted. there is no CVA tenderness Musculoskeletal: No lower extremity tenderness, no upper extremity tenderness. No joint effusions, no DVT signs strong distal pulses no edema Neurologic:  Normal speech and language. No gross focal neurologic deficits are appreciated.  Skin:  Skin is warm, dry and intact. No rash noted. Psychiatric: Mood and affect are normal. Speech and behavior are  normal.  ____________________________________________   LABS (all labs ordered are listed, but only abnormal results are displayed)  Labs Reviewed  BASIC METABOLIC PANEL - Abnormal; Notable for the following:       Result Value   Creatinine, Ser 0.43 (*)    All other components within normal limits  CULTURE, BLOOD (ROUTINE X 2)  CULTURE, BLOOD (ROUTINE X 2)  URINE CULTURE  CBC  CK  PROTIME-INR  URINALYSIS, COMPLETE (UACMP) WITH MICROSCOPIC  LACTIC ACID, PLASMA  LACTIC ACID, PLASMA   ____________________________________________  EKG  I personally interpreted any EKGs ordered by me or triage  ____________________________________________  RADIOLOGY  I reviewed any imaging ordered by me or triage that were performed during my shift and, if possible, patient and/or family made aware of any abnormal findings. ____________________________________________  PROCEDURES  Procedure(s) performed: None  Procedures  Critical Care performed: None  ____________________________________________   INITIAL IMPRESSION / ASSESSMENT AND PLAN / ED COURSE  Pertinent labs & imaging results that were available during my care of the patient were reviewed by me and considered in my medical decision making (see chart for details).  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 I do think the patient should follow closely with rheumatology, I have offered her extensive workup here including Coumadin levels, and blood cultures etc. but she refuses. She states that now that her fevers come down and she wants to go home. She states that this is a chronic problem for her and she does not wish any further intervention. She understands limitation this places upon me in terms of evaluating her disease process. She refuses  admission. She is requesting discharge paperwork. Extensive return precautions and follow-up given and understood patient realizes and understands that she can return to the emergency room at any time should she change her mind about further workup. I have advised her however that a tertiary care center with its subspecialties might be of more utility to her but again we are always happy to see her here. Patient is very understanding of this. She declines INR checked therefore cannot evaluate her therapeutic levels of Coumadin. Patient is in no acute distress, soon as her fever broke she had no symptoms this is a chronic recurrent issue according to her and she has no evidence of active infection or sepsis.  ____________________________________________   FINAL CLINICAL IMPRESSION(S) / ED DIAGNOSES  Final diagnoses:  None      This chart was dictated using voice recognition software.  Despite best efforts to proofread,  errors can occur which can change meaning.      Schuyler Amor, MD 06/14/17 [phone]

## 2017-06-14 NOTE — ED Triage Notes (Signed)
Patient states that she is having a an "episode". Patient states she has been having these episode of headaches and fevers for 4 years been to multiple doctors but no one can seems to figure out what is going on. Patient states this one started at 3pm but not feeling well this morning. Pt had an MRI 2 weeks ago.

## 2017-10-18 ENCOUNTER — Other Ambulatory Visit: Payer: Self-pay | Admitting: Internal Medicine

## 2017-10-18 DIAGNOSIS — Z1239 Encounter for other screening for malignant neoplasm of breast: Secondary | ICD-10-CM

## 2017-12-01 ENCOUNTER — Ambulatory Visit
Admission: RE | Admit: 2017-12-01 | Discharge: 2017-12-01 | Disposition: A | Discharge status: Home / Self Care | Payer: Medicare Other | Source: Ambulatory Visit | Attending: Internal Medicine | Admitting: Internal Medicine

## 2017-12-01 ENCOUNTER — Other Ambulatory Visit: Payer: Self-pay | Admitting: Internal Medicine

## 2017-12-01 DIAGNOSIS — Z1239 Encounter for other screening for malignant neoplasm of breast: Secondary | ICD-10-CM

## 2017-12-01 DIAGNOSIS — Z1231 Encounter for screening mammogram for malignant neoplasm of breast: Secondary | ICD-10-CM | POA: Insufficient documentation

## 2018-09-01 DIAGNOSIS — Z87442 Personal history of urinary calculi: Secondary | ICD-10-CM | POA: Insufficient documentation

## 2018-09-01 DIAGNOSIS — F411 Generalized anxiety disorder: Secondary | ICD-10-CM | POA: Insufficient documentation

## 2019-03-01 ENCOUNTER — Other Ambulatory Visit: Payer: Self-pay | Admitting: Internal Medicine

## 2019-03-01 DIAGNOSIS — Z1231 Encounter for screening mammogram for malignant neoplasm of breast: Secondary | ICD-10-CM

## 2019-09-13 ENCOUNTER — Other Ambulatory Visit: Payer: Self-pay

## 2019-09-13 ENCOUNTER — Other Ambulatory Visit: Payer: Self-pay | Admitting: Internal Medicine

## 2019-09-13 ENCOUNTER — Ambulatory Visit
Admission: RE | Admit: 2019-09-13 | Discharge: 2019-09-13 | Disposition: A | Discharge status: Home / Self Care | Payer: Medicare Other | Source: Ambulatory Visit | Attending: Internal Medicine | Admitting: Internal Medicine

## 2019-09-13 DIAGNOSIS — R1011 Right upper quadrant pain: Secondary | ICD-10-CM

## 2019-09-13 MED ORDER — IOHEXOL 300 MG/ML  SOLN
80.0000 mL | Freq: Once | INTRAMUSCULAR | Status: AC | PRN
  Administered 2019-09-13: 80 mL via INTRAVENOUS

## 2019-09-17 ENCOUNTER — Encounter: Payer: Self-pay | Admitting: *Deleted

## 2019-09-17 ENCOUNTER — Encounter: Payer: Self-pay | Admitting: Oncology

## 2019-09-17 DIAGNOSIS — R918 Other nonspecific abnormal finding of lung field: Secondary | ICD-10-CM

## 2019-09-17 NOTE — Progress Notes (Signed)
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  She will need a PET CT scan for staging work-up as well as to facilitate CT-guided biopsy which I will order today.  Dr. Randa Evens, MD, MPH [place] [facility] [phone] 09/17/2019 10:12 AM

## 2019-09-17 NOTE — Progress Notes (Signed)
  Oncology Nurse Navigator Documentation  Navigator Location: [facility]Med Onc (09/17/19 1000) Referral Date to RadOnc/MedOnc: 09/17/19 (09/17/19 1000) )Navigator Encounter Type: Introductory Phone Call (09/17/19 1000)   Abnormal Finding Date: 09/13/19 (09/17/19 1000)                   Treatment Phase: Abnormal Scans (09/17/19 1000) Barriers/Navigation Needs: Anxiety;Coordination of Care;Personal Conflicts (70/78/67 5449)   Interventions: Coordination of Care (09/17/19 1000)   Coordination of Care: Appts;Radiology (09/17/19 1000)        Acuity: Level 2-Minimal Needs (1-2 Barriers Identified) (09/17/19 1000)    referral received from Dr. Ginette Pitman for lung mass. Phone call made to patient to introduce to navigator services and review upcoming appts this week. Pt was tearful and anxious during phone call. Reassurance provided. Informed of PET scan on Thursday 10/15 at 10:30am and new patient consult with Dr. Janese Banks on Thursday 10/15 at 3pm. Contact info given and instructed to call with any further questions or needs. Pt verbalized understanding. Nothing further needed at this time.     Time Spent with Patient: 30 (09/17/19 1000)

## 2019-09-20 ENCOUNTER — Encounter: Payer: Self-pay | Admitting: *Deleted

## 2019-09-20 ENCOUNTER — Inpatient Hospital Stay: Payer: Medicare Other

## 2019-09-20 ENCOUNTER — Other Ambulatory Visit: Payer: Self-pay

## 2019-09-20 ENCOUNTER — Inpatient Hospital Stay: Payer: Medicare Other | Attending: Oncology | Admitting: Oncology

## 2019-09-20 ENCOUNTER — Encounter: Payer: Self-pay | Admitting: Oncology

## 2019-09-20 ENCOUNTER — Ambulatory Visit
Admission: RE | Admit: 2019-09-20 | Discharge: 2019-09-20 | Disposition: A | Discharge status: Home / Self Care | Payer: Medicare Other | Source: Ambulatory Visit | Attending: Oncology | Admitting: Oncology

## 2019-09-20 VITALS — BP 125/72 | HR 72 | Temp 97.1°F | Resp 16 | Wt 133.7 lb

## 2019-09-20 DIAGNOSIS — R918 Other nonspecific abnormal finding of lung field: Secondary | ICD-10-CM | POA: Insufficient documentation

## 2019-09-20 DIAGNOSIS — K5903 Drug induced constipation: Secondary | ICD-10-CM | POA: Diagnosis not present

## 2019-09-20 DIAGNOSIS — C781 Secondary malignant neoplasm of mediastinum: Secondary | ICD-10-CM | POA: Diagnosis not present

## 2019-09-20 DIAGNOSIS — C3431 Malignant neoplasm of lower lobe, right bronchus or lung: Secondary | ICD-10-CM | POA: Diagnosis present

## 2019-09-20 DIAGNOSIS — G893 Neoplasm related pain (acute) (chronic): Secondary | ICD-10-CM | POA: Diagnosis not present

## 2019-09-20 DIAGNOSIS — C7971 Secondary malignant neoplasm of right adrenal gland: Secondary | ICD-10-CM | POA: Insufficient documentation

## 2019-09-20 DIAGNOSIS — Z7189 Other specified counseling: Secondary | ICD-10-CM | POA: Diagnosis not present

## 2019-09-20 DIAGNOSIS — T402X5A Adverse effect of other opioids, initial encounter: Secondary | ICD-10-CM | POA: Insufficient documentation

## 2019-09-20 DIAGNOSIS — E042 Nontoxic multinodular goiter: Secondary | ICD-10-CM | POA: Diagnosis not present

## 2019-09-20 DIAGNOSIS — G7129 Other congenital myopathy: Secondary | ICD-10-CM | POA: Diagnosis not present

## 2019-09-20 DIAGNOSIS — Z79899 Other long term (current) drug therapy: Secondary | ICD-10-CM | POA: Diagnosis not present

## 2019-09-20 DIAGNOSIS — C7972 Secondary malignant neoplasm of left adrenal gland: Secondary | ICD-10-CM | POA: Insufficient documentation

## 2019-09-20 LAB — COMPREHENSIVE METABOLIC PANEL
ALT: 18 U/L (ref 0–44)
AST: 18 U/L (ref 15–41)
Albumin: 3.8 g/dL (ref 3.5–5.0)
Alkaline Phosphatase: 70 U/L (ref 38–126)
Anion gap: 9 (ref 5–15)
BUN: 17 mg/dL (ref 6–20)
CO2: 28 mmol/L (ref 22–32)
Calcium: 9.9 mg/dL (ref 8.9–10.3)
Chloride: 104 mmol/L (ref 98–111)
Creatinine, Ser: 0.35 mg/dL — ABNORMAL LOW (ref 0.44–1.00)
GFR calc Af Amer: >60 mL/min (ref >60)
GFR calc non Af Amer: >60 mL/min (ref >60)
Glucose, Bld: 103 mg/dL — ABNORMAL HIGH (ref 70–99)
Potassium: 4.1 mmol/L (ref 3.5–5.1)
Sodium: 141 mmol/L (ref 135–145)
Total Bilirubin: 0.4 mg/dL (ref 0.3–1.2)
Total Protein: 8.2 g/dL — ABNORMAL HIGH (ref 6.5–8.1)

## 2019-09-20 LAB — CBC WITH DIFFERENTIAL/PLATELET
Abs Immature Granulocytes: 0.03 10*3/uL (ref 0.00–0.07)
Basophils Absolute: 0.1 10*3/uL (ref 0.0–0.1)
Basophils Relative: 1 %
Eosinophils Absolute: 0.1 10*3/uL (ref 0.0–0.5)
Eosinophils Relative: 1 %
HCT: 36.7 % (ref 36.0–46.0)
Hemoglobin: 11.8 g/dL — ABNORMAL LOW (ref 12.0–15.0)
Immature Granulocytes: 0 %
Lymphocytes Relative: 11 %
Lymphs Abs: 1 10*3/uL (ref 0.7–4.0)
MCH: 30.6 pg (ref 26.0–34.0)
MCHC: 32.2 g/dL (ref 30.0–36.0)
MCV: 95.1 fL (ref 80.0–100.0)
Monocytes Absolute: 0.8 10*3/uL (ref 0.1–1.0)
Monocytes Relative: 9 %
Neutro Abs: 7.1 10*3/uL (ref 1.7–7.7)
Neutrophils Relative %: 78 %
Platelets: 441 10*3/uL — ABNORMAL HIGH (ref 150–400)
RBC: 3.86 MIL/uL — ABNORMAL LOW (ref 3.87–5.11)
RDW: 12.1 % (ref 11.5–15.5)
WBC: 9 10*3/uL (ref 4.0–10.5)
nRBC: 0 % (ref 0.0–0.2)

## 2019-09-20 LAB — GLUCOSE, CAPILLARY: Glucose-Capillary: 80 mg/dL (ref 70–99)

## 2019-09-20 MED ORDER — FLUDEOXYGLUCOSE F - 18 (FDG) INJECTION
6.6900 | Freq: Once | INTRAVENOUS | Status: AC | PRN
  Administered 2019-09-20: 6.69 via INTRAVENOUS

## 2019-09-20 NOTE — Progress Notes (Signed)
Tumor Board Documentation  Deborah Nguyen was presented by Norvel Richards, RN at our Tumor Board on 09/20/2019, which included representatives from medical oncology, radiation oncology, pathology, radiology, surgical, navigation, internal medicine, pharmacy, genetics, pulmonology, palliative care, research.  [name] currently presents as a new patient, for discussion with history of the following treatments: active survellience.  Additionally, we reviewed previous medical and familial history, history of present illness, and recent lab results along with all available histopathologic and imaging studies. The tumor board considered available treatment options and made the following recommendations: Biopsy CT Guided Adrenal biopsy vs EBUS biopsy  The following procedures/referrals were also placed: No orders of the defined types were placed in this encounter.   Clinical Trial Status: not discussed   Staging used: To be determined  National site-specific guidelines   were discussed with respect to the case.  Tumor board is a meeting of clinicians from various specialty areas who evaluate and discuss patients for whom a multidisciplinary approach is being considered. Final determinations in the plan of care are those of the provider(s). The responsibility for follow up of recommendations given during tumor board is that of the provider.   Today's extended care, comprehensive team conference, [place] was not present for the discussion and was not examined.   Multidisciplinary Tumor Board is a multidisciplinary case peer review process.  Decisions discussed in the Multidisciplinary Tumor Board reflect the opinions of the specialists present at the conference without having examined the patient.  Ultimately, treatment and diagnostic decisions rest with the primary provider(s) and the patient.

## 2019-09-20 NOTE — Patient Instructions (Signed)
-  Start taking hydrocodone every 6 hours to help with pain. -Start Miralax daily and Senokot 2 tablets before bed to help relieve constipation.   -We will give you a call once your biopsy is scheduled.

## 2019-09-20 NOTE — Progress Notes (Signed)
Pt very anxious, has 2 support people in with her. She has daughter and grandkids. She is not having any pain now but yest. She had pain and nausea and PCP gave her rx for pain med hydrocodone and zofran for nausea and she felt better today

## 2019-09-21 ENCOUNTER — Ambulatory Visit: Payer: Medicare Other | Admitting: Oncology

## 2019-09-21 NOTE — Progress Notes (Signed)
  Oncology Nurse Navigator Documentation  Navigator Location: [facility] (09/20/19 1630)   )Navigator Encounter Type: Initial MedOnc (09/20/19 1630)                         Barriers/Navigation Needs: Coordination of Care (09/20/19 1630)   Interventions: Coordination of Care (09/20/19 1630)   Coordination of Care: Appts;Radiology (09/20/19 1630)         met with patient and her family members during initial consultation with Dr. Janese Banks. Pt tearful and anxious during visit. Reassurance provided. All questions answered during visit. Reviewed upcoming appts with patient and informed that will be notified by phone once her biopsy has been scheduled. Pt currently taking coumadin prescribed by PCP. Will get urgent clearance to hold coumadin prior to biopsy. Contact info given to patient and instructed to call with any further questions or needs. Pt verbalized understanding.          Time Spent with Patient: 60 (09/20/19 1630)

## 2019-09-23 ENCOUNTER — Encounter: Payer: Self-pay | Admitting: Oncology

## 2019-09-23 NOTE — Progress Notes (Signed)
Hematology/Oncology Consult note [facility] Telephone:([phone] [phone]  Patient Care Team: Tracie Harrier, MD as PCP - General (Internal Medicine) Telford Nab, RN as Registered Nurse   Name of the patient: Deborah Nguyen  [id number]  07/12/1961    Reason for referral- lung mass   Referring physician- Dr. Ginette Pitman  Date of visit: 09/23/19   History of presenting illness- Patient is a 58 year old female who is a present smoker of about 1 pack/day.  She presented to Dr. Roselee Culver with symptoms of right flank pain which was radiating to her front which prompted a CT abdomen.  CT abdomen pelvis with contrast showed a 3.7 x 2.8 x 5 cm right adrenal mass.  1.7 x 1.2 x 2.1 cm left adrenal nodule 1.8 cm right renal simple cyst.  And a new 3.6 x 3.2 cm spiculated mass in the right lower lobe with tethering of the adjacent pleura.  Prominent hilar lymph nodes.  This was followed by a PET CT scan on 09/20/2019 which showed a 3.6 cm right lower lobe mass which was markedly hypermetabolic along with hypermetabolic metastatic disease in the right supraclavicular space as well as mediastinum and right hilum and hypermetabolic metastatic disease in bilateral adrenal glands.  She will also noted to have bilateral hypermetabolic thyroid nodules and a possible nodule in the pelvis adjacent to the sigmoid colon.  Patient currently reports ongoing pain in her right flank which is relatively well controlled with hydrocodone.  She also has occasional nausea for which she has as needed nausea medications.  She is here with her twin sister today and is highly anxious Of note patient has a history of central core myopathy and follows up with neuromuscular clinic at [facility].  Patient states that her twin sister has had episodes of hyperthermia but patient herself has not had these episodes.  I did review [facility] neuromuscular clinic note and the hyperthermia has been attributed to her  history of hyperthyroidism and thyroid dysfunction rather than malignant hyperthermia.  However given her history of myopathy patient does carry risk of malignant hyperthermia.  At baseline patient is independent of her ADLs and IADLs.  Other than her ongoing pain she denies other complaints.  Her appetite is good and she denies any unintentional weight loss.  She denies any cough shortness of breath  ECOG PS- 0  Pain scale- 0   Review of systems- Review of Systems  Constitutional: Negative for chills, fever, malaise/fatigue and weight loss.  HENT: Negative for congestion, ear discharge and nosebleeds.   Eyes: Negative for blurred vision.  Respiratory: Negative for cough, hemoptysis, sputum production, shortness of breath and wheezing.   Cardiovascular: Negative for chest pain, palpitations, orthopnea and claudication.  Gastrointestinal: Negative for abdominal pain, blood in stool, constipation, diarrhea, heartburn, melena, nausea and vomiting.       Right sided flank pain  Genitourinary: Negative for dysuria, flank pain, frequency, hematuria and urgency.  Musculoskeletal: Negative for back pain, joint pain and myalgias.  Skin: Negative for rash.  Neurological: Negative for dizziness, tingling, focal weakness, seizures, weakness and headaches.  Endo/Heme/Allergies: Does not bruise/bleed easily.  Psychiatric/Behavioral: Negative for depression and suicidal ideas. The patient is nervous/anxious. The patient does not have insomnia.     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 , Disp: , Rfl:    fluticasone (FLONASE) 50 MCG/ACT nasal spray, Place 2 sprays into both nostrils daily as needed. , Disp: , Rfl:    HYDROcodone-acetaminophen (NORCO) 5-325 MG tablet, Take 1 tablet by mouth every 6 (six) hours as needed for moderate pain. , Disp: , Rfl:    methimazole (TAPAZOLE) 5 MG tablet, Take 5 mg by mouth daily., Disp: , Rfl:    metoprolol succinate (TOPROL-XL) 12.5 mg TB24, Take 12.5 mg by mouth daily.  , Disp: , Rfl:    ondansetron (ZOFRAN-ODT) 4 MG disintegrating tablet, Take 4 mg by mouth every 8 (eight) hours as needed for nausea or vomiting., Disp: , Rfl:    warfarin (COUMADIN) 5 MG tablet, Take 5 mg by mouth daily. Sat and sun 3 mg, Disp: , Rfl:    Physical exam:  Vitals:   09/20/19 1555 09/20/19 1556  BP: 125/72   Pulse: 72   Resp: 16   Temp:  (!) 97.1 F (36.2 C)  TempSrc: Tympanic Tympanic  Weight: 133 lb 11.2 oz (60.6 kg)    Physical Exam Constitutional:      Comments: Extremely anxious  HENT:     Head: Normocephalic and atraumatic.  Eyes:     Pupils: Pupils are equal, round, and reactive to light.  Neck:     Musculoskeletal: Normal range of motion.  Cardiovascular:     Rate and Rhythm: Normal rate and regular rhythm.     Heart sounds: Normal heart sounds.  Pulmonary:     Effort: Pulmonary effort is normal.     Breath sounds: Normal breath sounds.  Abdominal:     General: Bowel sounds are normal.     Palpations: Abdomen is  soft.  Skin:    General: Skin is warm and dry.  Neurological:     Mental Status: She is alert and oriented to person, place, and time.        CMP Latest Ref Rng & Units 09/20/2019  Glucose 70 - 99 mg/dL 103(H)  BUN 6 - 20 mg/dL 17  Creatinine 0.44 - 1.00 mg/dL 0.35(L)  Sodium 135 - 145 mmol/L 141  Potassium 3.5 - 5.1 mmol/L 4.1  Chloride 98 - 111 mmol/L 104  CO2 22 - 32 mmol/L 28  Calcium 8.9 - 10.3 mg/dL 9.9  Total Protein 6.5 - 8.1 g/dL 8.2(H)  Total Bilirubin 0.3 - 1.2 mg/dL 0.4  Alkaline Phos 38 - 126 U/L 70  AST 15 - 41 U/L 18  ALT 0 - 44 U/L 18   CBC Latest Ref Rng & Units 09/20/2019  WBC 4.0 - 10.5 K/uL 9.0  Hemoglobin 12.0 - 15.0  g/dL 11.8(L)  Hematocrit 36.0 - 46.0 % 36.7  Platelets 150 - 400 K/uL 441(H)    No images are attached to the encounter.  Ct Abdomen Pelvis W Contrast  Result Date: 09/13/2019 CLINICAL DATA:  Right lower quadrant pain for the past 2 weeks. EXAM: CT ABDOMEN AND PELVIS WITH CONTRAST TECHNIQUE: Multidetector CT imaging of the abdomen and pelvis was performed using the standard protocol following bolus administration of intravenous contrast. CONTRAST:  78mL OMNIPAQUE IOHEXOL 300 MG/ML  SOLN COMPARISON:  CT abdomen pelvis dated November 08, 2013. FINDINGS: Lower chest: New 3.6 x 3.2 cm spiculated mass in the right lower lobe with tethering of the adjacent pleura. Prominent right hilar lymph node measuring 1.0 cm in short axis. Hepatobiliary: No focal liver abnormality is seen. No gallstones, gallbladder wall thickening, or biliary dilatation. Pancreas: Unremarkable. No pancreatic ductal dilatation or surrounding inflammatory changes. Spleen: Normal in size without focal abnormality. Adrenals/Urinary Tract: New 3.7 x 2.8 x 5.0 cm hypodense right adrenal mass. New 1.7 x 1.2 x 2.1 cm hypodense left adrenal nodule. 4.8 cm right renal simple cyst, increased in size since the prior study. No renal calculi or hydronephrosis. The bladder is unremarkable.  Stomach/Bowel: Stomach is within normal limits. Appendix appears normal. No evidence of bowel wall thickening, distention, or inflammatory changes. Vascular/Lymphatic: No significant vascular findings are present. No enlarged abdominal or pelvic lymph nodes. Reproductive: Uterus and bilateral adnexa are unremarkable. Other: No abdominal wall hernia or abnormality. No abdominopelvic ascites. No pneumoperitoneum. Musculoskeletal: No acute or significant osseous findings. Mild diffuse muscle atrophy. Chronic mild T12 superior endplate compression deformity. IMPRESSION: 1.  No acute intra-abdominal process. 2. New 3.6 x 3.2 cm spiculated mass in the right lower lobe with tethering of the adjacent pleura, highly concerning for primary bronchogenic carcinoma. Consultation with pulmonary medicine or thoracic surgery is suggested, as clinically appropriate. 3. New bilateral adrenal masses measuring up to 5.0 cm on the right, consistent with metastatic disease. These results will be called to the ordering clinician or representative by the Radiologist Assistant, and communication documented in the PACS or zVision Dashboard. Electronically Signed   By: Titus Dubin M.D.   On: 09/13/2019 17:50   Nm Pet Image Initial (pi) Skull Base To Thigh  Result Date: 09/20/2019 CLINICAL DATA:  Initial treatment strategy for lung mass. EXAM: NUCLEAR MEDICINE PET SKULL BASE TO THIGH TECHNIQUE: 6.7 mCi F-18 FDG was injected intravenously. Full-ring PET imaging was performed from the skull base to thigh after the radiotracer. CT data was obtained and used for attenuation correction and anatomic localization. Fasting blood glucose: 80 mg/dl COMPARISON:  Abdomen/pelvis CT 09/13/2019. FINDINGS: Mediastinal blood pool activity: SUV max 3.0 Liver activity: SUV max NA NECK: 5 mm short axis right supraclavicular node (09/8) is hypermetabolic with SUV max = 11.9. Hypermetabolic nodules are identified in the thyroid parenchyma bilaterally.  Incidental CT findings: none CHEST: 3.6 cm right lower lobe pulmonary mass is markedly hypermetabolic with SUV max = 14.7. Hypermetabolic mediastinal lymphadenopathy evident. *8 mm short axis high paraesophageal node on 52/3 demonstrates SUV max = 4.2. *8 mm retrotracheal node on 62/3 demonstrates SUV max = 7.3. *1.3 cm short axis subcarinal node is hypermetabolic with SUV max = 82.9. *1.1 cm right hilar node is hypermetabolic with SUV max = 56.2. Incidental CT findings: Centrilobular emphsyema noted. Atherosclerotic calcification is noted in the wall of the thoracic aorta. No evidence for pleural effusion. There is a tiny 2 mm right lower lobe nodule (75/3) too small to characterize  and below threshold for resolution on PET imaging. ABDOMEN/PELVIS: 4 cm right adrenal mass shows marked hypermetabolism consistent with metastatic involvement ( SUV max = 16.6. Smaller 2.1 cm left adrenal metastasis is also hypermetabolic. Focus of hypermetabolism in the central pelvis maps to an apparent sigmoid diverticulum (195/3) although this could be a pericolonic lymph node. Activity in the right colon is presumably physiologic. Incidental CT findings: 4.5 cm cyst identified interpolar right kidney anteriorly. Left colonic diverticulosis evident without CT findings of diverticulitis. SKELETON: No focal hypermetabolic activity to suggest skeletal metastasis. Incidental CT findings: Prominent muscular atrophy noted throughout consistent with reported history of muscular dystrophy. IMPRESSION: 1. 3.6 cm right lower lobe pulmonary mass is markedly hypermetabolic consistent with primary bronchogenic neoplasm. There is associated hypermetabolic metastatic disease in the right supraclavicular space, mediastinum, right hilum, and both adrenal glands. Hypermetabolic nodule in the pelvis adjacent to the sigmoid colon is probably a diverticulum although a small hypermetabolic pericolonic lymph node could have this appearance. Attention on  follow-up recommended. 2. Hypermetabolic bilateral thyroid nodules not well evaluated on noncontrast CT. Thyroid ultrasound may prove helpful to further evaluate or attention on follow-up could be used to assess stability. 3.  Aortic Atherosclerois (ICD10-170.0) Electronically Signed   By: Misty Stanley M.D.   On: 09/20/2019 14:03    Assessment and plan- Patient is a 58 y.o. female with newly diagnosed right lower lobe lung mass, mediastinal hilar and supraclavicular right-sided adenopathy as well as bilateral adrenal metastases concerning for metastatic lung cancer  I have reviewed CT abdomen pelvis as well as PET/CT scan images independently and discussed findings with the patient.  We also reviewed her case at the tumor board.  Findings of right lower lobe lung mass as well as local regional adenopathy and bilateral adrenal metastases concerning for stage IV lung cancer.  At this time she would need tissue diagnosis for further work-up.  Plan is to proceed with right adrenal biopsy given that it is significantly hypermetabolic on PET CT scan which would avoid putting her to a bronchoscopy.  I will obtain baseline plasma metanephrines prior to her renal biopsy.  Also given her history of central core myopathy which carries a risk of malignant hyperthermia we will touch base with [facility] as well if any additional precautions need to be taken prior to moderate sedation.  Patient will also need MRI brain to complete her staging work-up  Discussed lung cancer can be either small cell versus non-small cell lung cancer.  Given her bilateral adrenal metastases unfortunately she has stage IV disease and treatment would be palliative not curative.  What type of treatment is offered will be based on her pathology but she will need systemic chemotherapy.  If she has non-small cell lung cancer we will be sending NGS testing to see if she has any targetable mutations which would make her eligible for pills instead of  systemic chemo.  I will tentatively see her back in 2 weeks time after her MRI and biopsy results are back  Neoplasm related pain: I have asked her to take hydrocodone every 6 hours as needed for her pain and she can call our office if she is running out of her pain medications.  Opioid-induced constipation: Patient reports she has not had a bowel movement for 2 to 3 days now.  Explained to her that opioids will invariably cause constipation and that she should take MiraLAX along with senna regularly to prevent this.   Thank you for this kind referral  and the opportunity to participate in the care of this patient   Visit Diagnosis 1. Malignant neoplasm of lower lobe of right lung ([place])   2. 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 Goals of care, counseling/discussion     Dr. Randa Evens, MD, MPH [facility] [phone] 09/23/2019 5:38 PM

## 2019-09-24 ENCOUNTER — Telehealth: Payer: Self-pay | Admitting: *Deleted

## 2019-09-24 NOTE — Telephone Encounter (Signed)
Message left with [facility] neurology regarding if additional precautions are needed prior to adrenal biopsy due to pt's history of central core myopathy and malignant hyperthermia.   Awaiting callback.

## 2019-09-26 ENCOUNTER — Ambulatory Visit
Admission: RE | Admit: 2019-09-26 | Discharge: 2019-09-26 | Disposition: A | Discharge status: Home / Self Care | Payer: Medicare Other | Source: Ambulatory Visit | Attending: Oncology | Admitting: Oncology

## 2019-09-26 ENCOUNTER — Other Ambulatory Visit: Payer: Self-pay

## 2019-09-26 DIAGNOSIS — C7971 Secondary malignant neoplasm of right adrenal gland: Secondary | ICD-10-CM | POA: Insufficient documentation

## 2019-09-26 DIAGNOSIS — C3431 Malignant neoplasm of lower lobe, right bronchus or lung: Secondary | ICD-10-CM | POA: Diagnosis present

## 2019-09-26 DIAGNOSIS — C7972 Secondary malignant neoplasm of left adrenal gland: Secondary | ICD-10-CM | POA: Insufficient documentation

## 2019-09-26 MED ORDER — GADOBUTROL 1 MMOL/ML IV SOLN
6.0000 mL | Freq: Once | INTRAVENOUS | Status: AC | PRN
  Administered 2019-09-26: 6 mL via INTRAVENOUS

## 2019-09-26 NOTE — Telephone Encounter (Signed)
Spoke with Janett Billow at [facility] endocrinology. Per Janett Billow, Dr. Gabriel Carina is unable to give precautions regarding malignant hyperthermia. Message left with [facility] Neurology in hopes that last MD that saw pt can give any precautions needed prior to biopsy on Monday. Informed that pt will receive versed and fantanyl for sedation during procedure.   Awaiting callback.

## 2019-09-26 NOTE — Telephone Encounter (Signed)
Spoke with Cristie Hem from [facility] neurology and was informed that pt was referred to endocrinology to help manage episodes of malignant hyperthermia based on MD documentation. Message with with Dr. Joycie Peek office to call back to inform if any precautions need to take place prior to pt's scheduled biopsy on Mon 10/26.   Awaiting callback.

## 2019-09-26 NOTE — Telephone Encounter (Signed)
Spoke with Cristie Hem at [facility] Neurology and stated that per Dr. Westley Foots pt is at high risk for developing malignant hyperthermia with biopsy scheduled on Monday. Dr. Westley Foots advised to inform anesthesia that pt is at risk for developing malignant hyperthermia but no other precautions need to take place prior to biopsy. When scheduling biopsy, IR was made aware of pt's history of malignant hyperthermia. I will also notify specials nursing staff that will be caring for patient before, during, and after biopsy.   Dr. Westley Foots recommended that pt make follow up appt with their clinic since has not been seen since 2018. Their clinic can offer video visit if that would be easier for the patient.

## 2019-09-27 LAB — METANEPHRINES, PLASMA
Metanephrine, Free: 14.1 pg/mL (ref 0.0–88.0)
Normetanephrine, Free: 70.9 pg/mL (ref 0.0–136.8)

## 2019-09-28 ENCOUNTER — Other Ambulatory Visit
Admission: RE | Admit: 2019-09-28 | Discharge: 2019-09-28 | Disposition: A | Discharge status: Home / Self Care | Payer: Medicare Other | Source: Ambulatory Visit | Attending: Oncology | Admitting: Oncology

## 2019-09-28 ENCOUNTER — Telehealth: Payer: Self-pay | Admitting: *Deleted

## 2019-09-28 ENCOUNTER — Other Ambulatory Visit: Payer: Medicare Other

## 2019-09-28 ENCOUNTER — Other Ambulatory Visit: Payer: Self-pay | Admitting: Physician Assistant

## 2019-09-28 DIAGNOSIS — Z01812 Encounter for preprocedural laboratory examination: Secondary | ICD-10-CM | POA: Diagnosis present

## 2019-09-28 DIAGNOSIS — Z20828 Contact with and (suspected) exposure to other viral communicable diseases: Secondary | ICD-10-CM | POA: Diagnosis not present

## 2019-09-28 DIAGNOSIS — R509 Fever, unspecified: Secondary | ICD-10-CM

## 2019-09-28 NOTE — Telephone Encounter (Signed)
Pt's sister called in to report that pt has been having occasional fevers and wanted Dr. Janese Banks to be aware. Per Dr. Janese Banks, pt needs to have covid testing done as a precaution since has upcoming biopsy on Monday. Dr. Janese Banks would like to keep biopsy as scheduled on Monday in hopes that covid results will be available. Pt scheduled for community testing and pt's sister notified to take the patient there ASAP. Pt's sister, [name], verbalized understanding.

## 2019-09-29 LAB — SARS CORONAVIRUS 2 (TAT 6-24 HRS): SARS Coronavirus 2: NEGATIVE

## 2019-10-01 ENCOUNTER — Other Ambulatory Visit: Payer: Self-pay | Admitting: *Deleted

## 2019-10-01 ENCOUNTER — Telehealth: Payer: Self-pay | Admitting: *Deleted

## 2019-10-01 ENCOUNTER — Ambulatory Visit
Admission: RE | Admit: 2019-10-01 | Discharge: 2019-10-01 | Disposition: A | Discharge status: Home / Self Care | Payer: Medicare Other | Source: Ambulatory Visit | Attending: Oncology | Admitting: Oncology

## 2019-10-01 ENCOUNTER — Other Ambulatory Visit: Payer: Self-pay

## 2019-10-01 DIAGNOSIS — Z86718 Personal history of other venous thrombosis and embolism: Secondary | ICD-10-CM | POA: Diagnosis not present

## 2019-10-01 DIAGNOSIS — F17210 Nicotine dependence, cigarettes, uncomplicated: Secondary | ICD-10-CM | POA: Diagnosis not present

## 2019-10-01 DIAGNOSIS — C7972 Secondary malignant neoplasm of left adrenal gland: Secondary | ICD-10-CM | POA: Insufficient documentation

## 2019-10-01 DIAGNOSIS — Z801 Family history of malignant neoplasm of trachea, bronchus and lung: Secondary | ICD-10-CM | POA: Insufficient documentation

## 2019-10-01 DIAGNOSIS — Z803 Family history of malignant neoplasm of breast: Secondary | ICD-10-CM | POA: Insufficient documentation

## 2019-10-01 DIAGNOSIS — C7971 Secondary malignant neoplasm of right adrenal gland: Secondary | ICD-10-CM | POA: Diagnosis not present

## 2019-10-01 DIAGNOSIS — Z8052 Family history of malignant neoplasm of bladder: Secondary | ICD-10-CM | POA: Diagnosis not present

## 2019-10-01 DIAGNOSIS — K219 Gastro-esophageal reflux disease without esophagitis: Secondary | ICD-10-CM | POA: Diagnosis not present

## 2019-10-01 DIAGNOSIS — M1990 Unspecified osteoarthritis, unspecified site: Secondary | ICD-10-CM | POA: Diagnosis not present

## 2019-10-01 DIAGNOSIS — Z79899 Other long term (current) drug therapy: Secondary | ICD-10-CM | POA: Diagnosis not present

## 2019-10-01 DIAGNOSIS — F419 Anxiety disorder, unspecified: Secondary | ICD-10-CM | POA: Diagnosis not present

## 2019-10-01 DIAGNOSIS — C3431 Malignant neoplasm of lower lobe, right bronchus or lung: Secondary | ICD-10-CM

## 2019-10-01 DIAGNOSIS — N189 Chronic kidney disease, unspecified: Secondary | ICD-10-CM | POA: Insufficient documentation

## 2019-10-01 DIAGNOSIS — G7100 Muscular dystrophy, unspecified: Secondary | ICD-10-CM | POA: Insufficient documentation

## 2019-10-01 LAB — CBC
HCT: 36.3 % (ref 36.0–46.0)
Hemoglobin: 11.3 g/dL — ABNORMAL LOW (ref 12.0–15.0)
MCH: 29.9 pg (ref 26.0–34.0)
MCHC: 31.1 g/dL (ref 30.0–36.0)
MCV: 96 fL (ref 80.0–100.0)
Platelets: 426 10*3/uL — ABNORMAL HIGH (ref 150–400)
RBC: 3.78 MIL/uL — ABNORMAL LOW (ref 3.87–5.11)
RDW: 12.3 % (ref 11.5–15.5)
WBC: 9.8 10*3/uL (ref 4.0–10.5)
nRBC: 0 % (ref 0.0–0.2)

## 2019-10-01 LAB — PROTIME-INR
INR: 1.5 — ABNORMAL HIGH (ref 0.8–1.2)
Prothrombin Time: 17.5 seconds — ABNORMAL HIGH (ref 11.4–15.2)

## 2019-10-01 MED ORDER — ONDANSETRON 4 MG PO TBDP: 4.0000 mg | ORAL_TABLET | Freq: Three times a day (TID) | ORAL | 2 refills | Status: DC | PRN

## 2019-10-01 MED ORDER — OXYCODONE HCL 10 MG PO TABS: 10.0000 mg | ORAL_TABLET | ORAL | 0 refills | Status: DC | PRN

## 2019-10-01 MED ORDER — FENTANYL CITRATE (PF) 100 MCG/2ML IJ SOLN
INTRAMUSCULAR | Status: AC | PRN
  Administered 2019-10-01 (×2): 50 ug via INTRAVENOUS

## 2019-10-01 MED ORDER — SODIUM CHLORIDE 0.9 % IV SOLN
INTRAVENOUS | Status: DC
  Administered 2019-10-01: 11:00:00 via INTRAVENOUS

## 2019-10-01 MED ORDER — MIDAZOLAM HCL 2 MG/2ML IJ SOLN
INTRAMUSCULAR | Status: AC | PRN
  Administered 2019-10-01 (×4): 1 mg via INTRAVENOUS

## 2019-10-01 MED ORDER — FENTANYL CITRATE (PF) 100 MCG/2ML IJ SOLN
INTRAMUSCULAR | Status: AC
  Filled 2019-10-01: qty 4

## 2019-10-01 MED ORDER — MIDAZOLAM HCL 5 MG/5ML IJ SOLN
INTRAMUSCULAR | Status: AC
  Filled 2019-10-01: qty 5

## 2019-10-01 NOTE — Progress Notes (Signed)
Patient clinically stable post Adrenal Mass biopsy per Dr Kathlene Cote, tolerated well, vitals stable throughout entire procedure. Awake/alert and oriented post procedure. Received VErsed 4mg  along with Fentanytl 120mcg iv for procedure. Brought back to specials to finish recovery. Report given to Fransico Michael Rn with questions answered.

## 2019-10-01 NOTE — Telephone Encounter (Signed)
Can we switch her to oxycodone 10 mg q4 prn instead? Is she going for biopsy today? Covid negative

## 2019-10-01 NOTE — Telephone Encounter (Signed)
Pt continues to have pain while taking hydrocodone-acetaminophen 5-325mg  tablets. Pt's sister is asking if pain medication can be increased to help control pain? Please advise.

## 2019-10-01 NOTE — Progress Notes (Addendum)
Ch provided social and spiritual support for the twin sister of the pt that is waiting for the pt to undergo a biopsy. Ch saw that both the pt and her sister were under significant emotional distress before the procedure. Ch provided compassionate presence to sister and asked guided questions regarding the recent diagnosis of cancer for the pt. As reported by the pt sister [name], both of them have a form of MD which limits their mobility and the type of trt they can received specifically regarding anesthesia. Pt sister shared that they are very close and have always been together especially during times when they had to be care givers for their mother who died ~ 2 years ago. Pt sister expressed how the pt is concerned about how she will share the news with her grandchildren and it is overwhelming to consider that the pt may not have long to live. Ch allowed space for the sister to express her grief and frustration. Ch presence was appreciated. Ch encouraged sister to find ways to celebrate the life of her sister and acknowledged her emotional response to a difficult situation.    10/01/19 1100  Clinical Encounter Type  Visited With Family  Visit Type Spiritual support;Social support;Patient in surgery  Spiritual Encounters  Spiritual Needs Emotional;Grief support  Stress Factors  Patient Stress Factors Exhausted;Health changes;Loss of control;Major life changes  Family Stress Factors Major life changes;Loss of control

## 2019-10-01 NOTE — Telephone Encounter (Signed)
Yes, no problem. I will send it to your inbasket to sign. She has checked in this morning for her biopsy. I spoke with her sister this morning to inform her of the covid results. She has scheduled follow up with you on Thursday at 1:30pm.

## 2019-10-01 NOTE — Progress Notes (Signed)
Pt. C/o mid right abdominal pain, radiating to right flank. States "it's been this way about 3 weeks now; I couldn't even visit my grand babies yesterday 'cause I was in so much pain." States pain is constant. Pt. Wishes to leave in 30 min. And pick up pain prescription instead of staying longer at hospital."

## 2019-10-01 NOTE — Addendum Note (Signed)
Addended by: Telford Nab on: 10/01/2019 01:10 PM   Modules accepted: Orders

## 2019-10-01 NOTE — H&P (Signed)
Chief Complaint: Patient was seen in consultation today for right adrenal mass biopsy at the request of Deborah Nguyen  Referring Physician(s): Deborah Nguyen  Patient Status: [facility] - Out-pt  History of Present Illness: Deborah Nguyen is a 58 y.o. female with imaging evidence of a nearly 5 cm RLL lung mass with 4 cm right adrenal and 2 cm left adrenal masses. PET shows increased metabolic activity in the lung mass, both adrenal masses and multiple mediastinal lymph nodes. After multidisciplinary evaluation and presentation at tumor board, decided that percutaneous right adrenal mass biopsy would be the safest approach for tissue and allow for evaluation for stage 4 disease.  [name] states that she is having diffuse pain. She has a chronic cough.  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 Authorizing Provider  acetaminophen (TYLENOL) 325 MG tablet Take 650 mg by mouth every 6 (six) hours as needed.   Yes [provider]  ALPRAZolam (XANAX) 0.25 MG tablet Take 0.25 mg by mouth at  bedtime as needed for anxiety.   Yes [provider]  esomeprazole (NEXIUM) 40 MG capsule Take 40 mg by mouth daily as needed.    Yes [provider]  fluticasone (FLONASE) 50 MCG/ACT nasal spray Place 2 sprays into both nostrils daily as needed.    Yes [provider]  methimazole (TAPAZOLE) 5 MG tablet Take 5 mg by mouth daily.   Yes [provider]  metoprolol succinate (TOPROL-XL) 12.5 mg TB24 Take 12.5 mg by mouth daily.     Yes [provider]  ondansetron (ZOFRAN-ODT) 4 MG disintegrating tablet Take 4 mg by mouth every 8 (eight) hours as needed for nausea or vomiting.   Yes [provider]  warfarin (COUMADIN) 5 MG tablet Take 5 mg by mouth daily. Sat and sun 3 mg   Yes [provider]  Oxycodone HCl 10 MG TABS Take 1 tablet (10 mg total) by mouth every 4 (four) hours as needed. 10/01/19   Deborah Guadeloupe, MD     Family History  Problem Relation Age of Onset   Cancer Father        bladder, lung   Breast cancer Maternal Aunt 81    Social History   Socioeconomic History   Marital status: Divorced    Spouse name: Not on file   Number of children: Not on file   Years of education: Not on file   Highest education level: Not on file  Occupational History   Not on file  Social Designer, fashion/clothing strain: Not on file   Food insecurity    Worry: Not on file    Inability: Not on file   Transportation needs    Medical: Not on file    Non-medical: Not on file  Tobacco Use   Smoking status: Current Every Day Smoker    Packs/day: 1.00    Types: Cigarettes   Smokeless tobacco: Current User  Substance and Sexual Activity   Alcohol use: No   Drug use: No   Sexual activity: Not on file  Lifestyle   Physical activity    Days per week: Not on file    Minutes per session: Not on file   Stress: Not on file  Relationships   Social connections    Talks on phone: Not on file    Gets together: Not  on file    Attends religious service: Not on file    Active member of club or organization: Not on file    Attends meetings of clubs or organizations: Not on file    Relationship status: Not on file  Other Topics Concern   Not on file  Social History Narrative   Not on file    ECOG Status: 1 - Symptomatic but completely ambulatory  Review of Systems: A 12 point ROS discussed and pertinent positives are indicated in the HPI above.  All other systems are negative.  Review of Systems  Constitutional: Negative.   HENT: Negative.   Respiratory: Positive for cough. Negative for shortness of breath, wheezing and stridor.   Cardiovascular: Negative.   Gastrointestinal: Positive for abdominal pain. Negative for abdominal distention, nausea and vomiting.  Genitourinary: Negative.   Musculoskeletal: Negative.   Neurological: Negative.     Vital Signs: BP (!) 147/58    Pulse 70    Temp 99.2 F (37.3 Nguyen) (Oral)    Resp 20    Ht 5\' 1"  (1.549 m)    Wt 60.3 kg    LMP 12/15/2010 (Approximate)    SpO2 100%    BMI 25.13 kg/m   Physical Exam Constitutional:      General: She is not in acute distress.    Appearance: She is not ill-appearing or toxic-appearing.     Comments: Extremely anxious.  HENT:     Head: Normocephalic and atraumatic.  Neck:     Musculoskeletal: Neck supple. No muscular tenderness.  Cardiovascular:     Rate and Rhythm: Normal rate and regular rhythm.     Heart sounds: Normal heart sounds. No murmur. No friction rub. No gallop.   Pulmonary:     Effort: No respiratory distress.     Breath sounds: Normal breath sounds. No stridor. No wheezing, rhonchi or rales.  Abdominal:     General: Abdomen is flat. There is no distension.     Palpations: Abdomen is soft. There is no mass.     Tenderness: There is no abdominal tenderness. There is no guarding or rebound.     Hernia: No hernia is present.  Musculoskeletal:        General: No swelling.  Lymphadenopathy:      Cervical: No cervical adenopathy.  Skin:    General: Skin is warm and dry.  Neurological:     General: No focal deficit present.     Mental Status: She is alert and oriented to person, place, and time.     Imaging: Mr Deborah Nguyen WF Contrast  Result Date: 09/26/2019 CLINICAL DATA:  Staging floor  are new diagnosis lung cancer. EXAM: MRI HEAD WITHOUT AND WITH CONTRAST TECHNIQUE: Multiplanar, multiecho pulse sequences of the brain and surrounding structures were obtained without and with intravenous contrast. 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 3 mm metastasis lateral left temporal lobe axial image 50. 3 mm right temporal metastasis axial image 61. 2 mm metastasis left temporal white matter axial image 63. 4 mm metastasis left parietooccipital junction axial image 76. 2 mm metastasis left posterior basal ganglia/radiating white matter tracts axial image 87. 3 mm metastasis right parietal cortex axial image 92. None are associated with mass effect, edema or hemorrhage. The brain otherwise appears normal except for a few punctate white matter foci consistent with minimal small vessel change. No hydrocephalus. No extra-axial collection. Vascular: Major vessels at the base of the brain show flow. Skull and upper cervical spine: Negative Sinuses/Orbits: Clear/normal Other: None IMPRESSION: Six tiny brain metastases as outlined above, all affecting the supratentorial brain. No edema, mass effect or hemorrhage. Electronically Signed   By: Nelson Chimes M.D.   On: 09/26/2019 14:42   Ct Abdomen Pelvis W Contrast  Result Date: 09/13/2019 CLINICAL DATA:  Right lower quadrant pain for the past 2 weeks. EXAM: CT ABDOMEN AND PELVIS WITH CONTRAST TECHNIQUE: Multidetector CT imaging of the abdomen and pelvis was performed using the standard protocol following bolus administration of intravenous contrast.  CONTRAST:  44mL OMNIPAQUE IOHEXOL 300 MG/ML  SOLN COMPARISON:  CT abdomen pelvis dated November 08, 2013. FINDINGS: Lower chest: New 3.6 x 3.2 cm spiculated mass in the right lower lobe with tethering of the adjacent pleura. Prominent right hilar lymph node measuring 1.0 cm in short axis. Hepatobiliary: No focal liver abnormality is seen. No gallstones, gallbladder wall thickening, or biliary dilatation. Pancreas: Unremarkable. No pancreatic ductal dilatation or surrounding inflammatory changes. Spleen: Normal in size without focal abnormality. Adrenals/Urinary Tract: New 3.7 x 2.8 x 5.0 cm hypodense right adrenal mass. New 1.7 x 1.2 x 2.1 cm hypodense left adrenal nodule. 4.8 cm right renal simple cyst, increased in size since the prior study. No renal calculi or hydronephrosis. The bladder is unremarkable. Stomach/Bowel: Stomach is within normal limits. Appendix appears normal. No evidence of bowel wall thickening, distention, or inflammatory changes. Vascular/Lymphatic: No significant vascular findings are present. No enlarged abdominal or pelvic lymph nodes. Reproductive: Uterus and bilateral adnexa are unremarkable. Other: No abdominal wall hernia or abnormality. No abdominopelvic ascites. No pneumoperitoneum. Musculoskeletal: No acute or significant osseous findings. Mild diffuse muscle atrophy. Chronic mild T12 superior endplate compression deformity. IMPRESSION: 1.  No acute intra-abdominal process. 2. New 3.6 x 3.2 cm spiculated mass in the right lower lobe with tethering of the adjacent pleura, highly concerning for primary bronchogenic carcinoma. Consultation with pulmonary medicine or thoracic surgery is suggested, as clinically appropriate. 3. New bilateral adrenal masses measuring up to 5.0 cm on the right, consistent with metastatic disease. These results will be called to the ordering clinician or representative by the Radiologist Assistant, and communication documented in the PACS or zVision  Dashboard. Electronically Signed   By: Titus Dubin M.D.   On: 09/13/2019 17:50   Nm Pet Image Initial (pi) Skull Base To Thigh  Result Date: 09/20/2019 CLINICAL DATA:  Initial treatment strategy for lung mass. EXAM: NUCLEAR MEDICINE PET SKULL BASE TO THIGH TECHNIQUE: 6.7 mCi F-18 FDG was injected intravenously. Full-ring PET imaging was performed from the skull base to thigh after the  radiotracer. CT data was obtained and used for attenuation correction and anatomic localization. Fasting blood glucose: 80 mg/dl COMPARISON:  Abdomen/pelvis CT 09/13/2019. FINDINGS: Mediastinal blood pool activity: SUV max 3.0 Liver activity: SUV max NA NECK: 5 mm short axis right supraclavicular node (99/8) is hypermetabolic with SUV max = 33.8. Hypermetabolic nodules are identified in the thyroid parenchyma bilaterally. Incidental CT findings: none CHEST: 3.6 cm right lower lobe pulmonary mass is markedly hypermetabolic with SUV max = 25.0. Hypermetabolic mediastinal lymphadenopathy evident. *8 mm short axis high paraesophageal node on 52/3 demonstrates SUV max = 4.2. *8 mm retrotracheal node on 62/3 demonstrates SUV max = 7.3. *1.3 cm short axis subcarinal node is hypermetabolic with SUV max = 53.9. *1.1 cm right hilar node is hypermetabolic with SUV max = 76.7. Incidental CT findings: Centrilobular emphsyema noted. Atherosclerotic calcification is noted in the wall of the thoracic aorta. No evidence for pleural effusion. There is a tiny 2 mm right lower lobe nodule (75/3) too small to characterize and below threshold for resolution on PET imaging. ABDOMEN/PELVIS: 4 cm right adrenal mass shows marked hypermetabolism consistent with metastatic involvement ( SUV max = 16.6. Smaller 2.1 cm left adrenal metastasis is also hypermetabolic. Focus of hypermetabolism in the central pelvis maps to an apparent sigmoid diverticulum (195/3) although this could be a pericolonic lymph node. Activity in the right colon is presumably  physiologic. Incidental CT findings: 4.5 cm cyst identified interpolar right kidney anteriorly. Left colonic diverticulosis evident without CT findings of diverticulitis. SKELETON: No focal hypermetabolic activity to suggest skeletal metastasis. Incidental CT findings: Prominent muscular atrophy noted throughout consistent with reported history of muscular dystrophy. IMPRESSION: 1. 3.6 cm right lower lobe pulmonary mass is markedly hypermetabolic consistent with primary bronchogenic neoplasm. There is associated hypermetabolic metastatic disease in the right supraclavicular space, mediastinum, right hilum, and both adrenal glands. Hypermetabolic nodule in the pelvis adjacent to the sigmoid colon is probably a diverticulum although a small hypermetabolic pericolonic lymph node could have this appearance. Attention on follow-up recommended. 2. Hypermetabolic bilateral thyroid nodules not well evaluated on noncontrast CT. Thyroid ultrasound may prove helpful to further evaluate or attention on follow-up could be used to assess stability. 3.  Aortic Atherosclerois (ICD10-170.0) Electronically Signed   By: Misty Stanley M.D.   On: 09/20/2019 14:03    Labs:  CBC: Recent Labs    09/20/19 1615 10/01/19 1008  WBC 9.0 9.8  HGB 11.8* 11.3*  HCT 36.7 36.3  PLT 441* 426*    COAGS: Recent Labs    10/01/19 1008  INR 1.5*    BMP: Recent Labs    09/20/19 1615  NA 141  K 4.1  CL 104  CO2 28  GLUCOSE 103*  BUN 17  CALCIUM 9.9  CREATININE 0.35*  GFRNONAA >60  GFRAA >60    LIVER FUNCTION TESTS: Recent Labs    09/20/19 1615  BILITOT 0.4  AST 18  ALT 18  ALKPHOS 70  PROT 8.2*  ALBUMIN 3.8     Assessment and Plan:  Multidisciplinary agreement to proceed with CT guided right adrenal mass biopsy as means of establishing tissue diagnosis. Risks and benefits of adrenal biopsy was discussed with the patient and/or patient's family including, but not limited to bleeding, infection, damage to  adjacent structures or low yield requiring additional tests. All of the questions were answered and there is agreement to proceed. Consent signed and in chart.  Thank you for this interesting consult.  I greatly enjoyed meeting Janus Molder  and look forward to participating in their care.  A copy of this report was sent to the requesting provider on this date.  Electronically Signed: Azzie Roup, MD 10/01/2019, 11:03 AM   I spent a total of 30 Minutes in face to face in clinical consultation, greater than 50% of which was counseling/coordinating care for adrenal mass biopsy.

## 2019-10-01 NOTE — Telephone Encounter (Signed)
No problem. She is scheduled to see him on Thursday as well after her visit with you.

## 2019-10-01 NOTE — Procedures (Signed)
Interventional Radiology Procedure Note  Procedure: CT Guided Biopsy of right adrenal mass  Complications: None  Estimated Blood Loss: < 10 mL  Findings: 18 G core biopsy of right adrenal mass performed under CT guidance.  Two core samples obtained and sent to Pathology.  Venetia Night. Kathlene Cote, M.D Pager:  [phone]

## 2019-10-01 NOTE — Telephone Encounter (Signed)
Sounds good. Please make sure she sees dr Baruch Gouty on Thursday too

## 2019-10-02 ENCOUNTER — Telehealth: Payer: Self-pay | Admitting: *Deleted

## 2019-10-02 NOTE — Telephone Encounter (Signed)
Pt called in to report is still having pain in right side and thinks the biopsy from yesterday made the pain worse. Pt states has only taken 1 oxycodone tablet last night. Encouraged patient to take the oxycodone every 4 hours as prescribed to see if that helps relieve her pain. Instructed pt to call back this afternoon to let me know how she is feeling. Pt verbalized understanding.

## 2019-10-03 ENCOUNTER — Emergency Department: Payer: Medicare Other

## 2019-10-03 ENCOUNTER — Emergency Department
Admission: EM | Admit: 2019-10-03 | Discharge: 2019-10-04 | Disposition: A | Discharge status: Home / Self Care | Payer: Medicare Other | Attending: Emergency Medicine | Admitting: Emergency Medicine

## 2019-10-03 ENCOUNTER — Other Ambulatory Visit: Payer: Self-pay

## 2019-10-03 ENCOUNTER — Other Ambulatory Visit: Payer: Self-pay | Admitting: Anatomic Pathology & Clinical Pathology

## 2019-10-03 ENCOUNTER — Encounter: Payer: Self-pay | Admitting: Emergency Medicine

## 2019-10-03 ENCOUNTER — Telehealth: Payer: Self-pay | Admitting: *Deleted

## 2019-10-03 DIAGNOSIS — Z79899 Other long term (current) drug therapy: Secondary | ICD-10-CM | POA: Diagnosis not present

## 2019-10-03 DIAGNOSIS — Z87891 Personal history of nicotine dependence: Secondary | ICD-10-CM | POA: Insufficient documentation

## 2019-10-03 DIAGNOSIS — R1010 Upper abdominal pain, unspecified: Secondary | ICD-10-CM | POA: Diagnosis not present

## 2019-10-03 DIAGNOSIS — Z7901 Long term (current) use of anticoagulants: Secondary | ICD-10-CM | POA: Diagnosis not present

## 2019-10-03 DIAGNOSIS — R509 Fever, unspecified: Secondary | ICD-10-CM | POA: Insufficient documentation

## 2019-10-03 LAB — URINALYSIS, COMPLETE (UACMP) WITH MICROSCOPIC
Bilirubin Urine: NEGATIVE
Glucose, UA: NEGATIVE mg/dL
Ketones, ur: NEGATIVE mg/dL
Leukocytes,Ua: NEGATIVE
Nitrite: NEGATIVE
Protein, ur: NEGATIVE mg/dL
Specific Gravity, Urine: 1.003 — ABNORMAL LOW (ref 1.005–1.030)
pH: 6 (ref 5.0–8.0)

## 2019-10-03 LAB — CBC WITH DIFFERENTIAL/PLATELET
Abs Immature Granulocytes: 0.04 10*3/uL (ref 0.00–0.07)
Basophils Absolute: 0 10*3/uL (ref 0.0–0.1)
Basophils Relative: 0 %
Eosinophils Absolute: 0 10*3/uL (ref 0.0–0.5)
Eosinophils Relative: 0 %
HCT: 32.6 % — ABNORMAL LOW (ref 36.0–46.0)
Hemoglobin: 10.2 g/dL — ABNORMAL LOW (ref 12.0–15.0)
Immature Granulocytes: 0 %
Lymphocytes Relative: 11 %
Lymphs Abs: 1.3 10*3/uL (ref 0.7–4.0)
MCH: 29.7 pg (ref 26.0–34.0)
MCHC: 31.3 g/dL (ref 30.0–36.0)
MCV: 95 fL (ref 80.0–100.0)
Monocytes Absolute: 1.1 10*3/uL — ABNORMAL HIGH (ref 0.1–1.0)
Monocytes Relative: 9 %
Neutro Abs: 9.7 10*3/uL — ABNORMAL HIGH (ref 1.7–7.7)
Neutrophils Relative %: 80 %
Platelets: 405 10*3/uL — ABNORMAL HIGH (ref 150–400)
RBC: 3.43 MIL/uL — ABNORMAL LOW (ref 3.87–5.11)
RDW: 12.2 % (ref 11.5–15.5)
WBC: 12.2 10*3/uL — ABNORMAL HIGH (ref 4.0–10.5)
nRBC: 0 % (ref 0.0–0.2)

## 2019-10-03 LAB — COMPREHENSIVE METABOLIC PANEL
ALT: 15 U/L (ref 0–44)
AST: 17 U/L (ref 15–41)
Albumin: 3.4 g/dL — ABNORMAL LOW (ref 3.5–5.0)
Alkaline Phosphatase: 79 U/L (ref 38–126)
Anion gap: 10 (ref 5–15)
BUN: 12 mg/dL (ref 6–20)
CO2: 26 mmol/L (ref 22–32)
Calcium: 8.9 mg/dL (ref 8.9–10.3)
Chloride: 98 mmol/L (ref 98–111)
Creatinine, Ser: 0.39 mg/dL — ABNORMAL LOW (ref 0.44–1.00)
GFR calc Af Amer: >60 mL/min (ref >60)
GFR calc non Af Amer: >60 mL/min (ref >60)
Glucose, Bld: 106 mg/dL — ABNORMAL HIGH (ref 70–99)
Potassium: 4.1 mmol/L (ref 3.5–5.1)
Sodium: 134 mmol/L — ABNORMAL LOW (ref 135–145)
Total Bilirubin: 0.3 mg/dL (ref 0.3–1.2)
Total Protein: 8 g/dL (ref 6.5–8.1)

## 2019-10-03 LAB — LACTIC ACID, PLASMA: Lactic Acid, Venous: 0.9 mmol/L (ref 0.5–1.9)

## 2019-10-03 LAB — SURGICAL PATHOLOGY

## 2019-10-03 MED ORDER — IOHEXOL 9 MG/ML PO SOLN
500.0000 mL | Freq: Once | ORAL | Status: DC | PRN
  Administered 2019-10-03: 500 mL via ORAL
  Filled 2019-10-03: qty 500

## 2019-10-03 MED ORDER — OXYCODONE HCL 5 MG PO TABS
5.0000 mg | ORAL_TABLET | Freq: Once | ORAL | Status: AC
  Administered 2019-10-03: 5 mg via ORAL
  Filled 2019-10-03: qty 1

## 2019-10-03 MED ORDER — OXYCODONE-ACETAMINOPHEN 5-325 MG PO TABS
1.0000 | ORAL_TABLET | ORAL | Status: DC | PRN
  Administered 2019-10-03: 1 via ORAL
  Filled 2019-10-03: qty 1

## 2019-10-03 MED ORDER — ACETAMINOPHEN 500 MG PO TABS
1000.0000 mg | ORAL_TABLET | Freq: Once | ORAL | Status: AC
  Administered 2019-10-03: 1000 mg via ORAL
  Filled 2019-10-03: qty 2

## 2019-10-03 MED ORDER — IOHEXOL 300 MG/ML  SOLN
100.0000 mL | Freq: Once | INTRAMUSCULAR | Status: AC | PRN
  Administered 2019-10-03: 100 mL via INTRAVENOUS

## 2019-10-03 MED ORDER — ACETAMINOPHEN 325 MG PO TABS
650.0000 mg | ORAL_TABLET | Freq: Once | ORAL | Status: DC
  Filled 2019-10-03: qty 2

## 2019-10-03 NOTE — Discharge Instructions (Signed)
Follow-up with your doctor tomorrow as scheduled.  Return to the ER immediately for new, worsening, or persistent high fever, worsening abdominal pain, vomiting, shortness of breath, or any other new or worsening symptoms that concern you.

## 2019-10-03 NOTE — ED Notes (Signed)
Pt's friend at bedside

## 2019-10-03 NOTE — Telephone Encounter (Signed)
Pt's sister called in to report that pt has been having fever since yesterday ranging from 100-101 at times. She has been taking tylenol but fevers continue to persist. Unable to schedule pt in symptom management clinic today. Dr. Janese Banks advised pt to be seen in ER for further evaluation. [name] has been made aware of MD recommendations and she verbalized understanding. Informed that Dr. Janese Banks would still like to keep pt's appt as scheduled on 10/29 at 1:30pm to discuss biopsy results.

## 2019-10-03 NOTE — ED Notes (Signed)
MD at bedside. 

## 2019-10-03 NOTE — ED Notes (Signed)
FIRST NURSE NOTE- pt reports temp 100, here for CXR per doctor and check kidney funtion.

## 2019-10-03 NOTE — ED Notes (Addendum)
Pt to CT; states she is feeling much better and her pain has improved.

## 2019-10-03 NOTE — ED Notes (Signed)
MD at the bedside to discuss results and plan of care.

## 2019-10-03 NOTE — ED Notes (Addendum)
CT tech at bedside. Oral contrast given. Pt verbalized understanding. Pt requesting a drink and pain medication.

## 2019-10-03 NOTE — ED Triage Notes (Signed)
Pt in via POV, advised to be evaluated here per Cancer MD.  Pt reports fever, nausea x approximately 1 week, negative Covid 10/23.  NAD noted at this time.

## 2019-10-03 NOTE — ED Notes (Signed)
Pt up to restroom with assistance. Sister at the bedside. Gait unsteady and pt states she takes medication that makes her feel weak and dizzy. Pt sister states pt hasn't eaten in 5 days. Pt talkative and states she is still feeling better and is wanting to go home.

## 2019-10-03 NOTE — ED Provider Notes (Addendum)
[facility] Emergency Department Provider Note ____________________________________________   First MD Initiated Contact with Patient 10/03/19 2014     (approximate)  I have reviewed the triage vital signs and the nursing notes.   HISTORY  Chief Complaint Fever    HPI Deborah Nguyen is a 58 y.o. female with PMH as noted below who presents with fever, measured to around 100 at home, and present for approximately the last week intermittently.  The patient states that for at least 6 years she has had frequent "episodes" as often as once per month where she runs a fever for a few days and is never diagnosed with any infection.  She states that this does feel similar to one of these episodes.  Patient reports some nausea, but denies vomiting or diarrhea.  She has no cough, shortness of breath, chest pain, or any urinary symptoms.  She does report that she had some right-sided abdominal pain after an adrenal biopsy earlier this week, which she attributes to the way that she was placed on the stretcher.  However she states that this pain is resolving and she has no active pain currently.  She was tested for COVID-19 5 days ago and was negative.  She was told by her oncologist to come to the ED for evaluation for the fever.  Past Medical History:  Diagnosis Date  . Anxiety   . Arthritis   . Chronic kidney disease   . Family history of breast cancer    aunt  . GERD (gastroesophageal reflux disease)   . History of DVT of lower extremity    Left leg  . Lung mass   . Malignant hyperthermia   . 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 Muscular dystrophy [facility])     Patient Active Problem List   Diagnosis Date Noted  . Goals of care, counseling/discussion 09/20/2019  . 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 Malignant neoplasm of lower lobe of right lung ([place]) 09/20/2019  . Multinodular goiter  (nontoxic) 07/05/2011    Past Surgical History:  Procedure Laterality Date  . CESAREAN SECTION  11/1985  . COLONOSCOPY N/A 10/27/2015   Procedure: COLONOSCOPY;  Surgeon: Manya Silvas, MD;  Location: [facility] ENDOSCOPY;  Service: Endoscopy;  Laterality: N/A;   NO Propofol - per office  . LITHOTRIPSY    . TUBAL LIGATION  03/1986    Prior to Admission medications   Medication Sig Start Date End Date Taking? Authorizing Provider  acetaminophen (TYLENOL) 325 MG tablet Take 650 mg by mouth every 6 (six) hours as needed.   Yes [provider]  ALPRAZolam (XANAX) 0.25 MG tablet Take 0.25 mg by mouth at bedtime as needed for anxiety.   Yes [provider]  esomeprazole (NEXIUM) 40 MG capsule Take 40 mg by mouth daily as needed.    Yes [provider]  fluticasone (FLONASE) 50 MCG/ACT nasal spray Place 2 sprays into both nostrils daily as needed.    Yes [provider]  methimazole (TAPAZOLE) 5 MG tablet Take 5 mg by mouth daily.   Yes [provider]  metoprolol succinate (TOPROL-XL) 12.5 mg TB24 Take 12.5 mg by mouth daily.     Yes [provider]  ondansetron (ZOFRAN-ODT) 4 MG disintegrating tablet Take 1 tablet (4 mg total) by mouth every 8 (eight) hours as needed for nausea or vomiting. 10/01/19  Yes Sindy Guadeloupe,  MD  Oxycodone HCl 10 MG TABS Take 1 tablet (10 mg total) by mouth every 4 (four) hours as needed. 10/01/19  Yes Sindy [place], MD  warfarin (COUMADIN) 5 MG tablet Take 5 mg by mouth daily. Sat and sun 3 mg   Yes [provider]    Allergies Paxil [paroxetine hcl], Ciprofloxacin, Levaquin [levofloxacin], Serzone [nefazodone], Biaxin [clarithromycin], Erythromycin, and Penicillins  Family History  Problem Relation Age of Onset  . Cancer Father        bladder, lung  . Breast cancer Maternal Aunt 55    Social History Social History   Tobacco Use  . Smoking status: Former Smoker    Packs/day: 1.00    Types:  Cigarettes  . Smokeless tobacco: Never Used  Substance Use Topics  . Alcohol use: No  . Drug use: No    Review of Systems  Constitutional: Positive for fever Eyes: No redness. ENT: No sore throat. Cardiovascular: Denies chest pain. Respiratory: Denies shortness of breath. Gastrointestinal: No vomiting or diarrhea.  Genitourinary: Negative for dysuria.  Musculoskeletal: Negative for back pain. Skin: Negative for rash. Neurological: Negative for headaches.   ____________________________________________   PHYSICAL EXAM:  VITAL SIGNS: ED Triage Vitals  Enc Vitals Group     BP 10/03/19 1649 (!) 133/107     Pulse Rate 10/03/19 1649 79     Resp 10/03/19 1649 17     Temp 10/03/19 1649 99.3 F (37.4 C)     Temp Source 10/03/19 1649 Oral     SpO2 10/03/19 1649 100 %     Weight 10/03/19 1651 130 lb (59 kg)     Height 10/03/19 1651 5\' 1"  (1.549 m)     Head Circumference --      Peak Flow --      Pain Score 10/03/19 1650 5     Pain Loc --      Pain Edu? --      Excl. in [place]? --     Constitutional: Alert and oriented.  Relatively well appearing and in no acute distress. Eyes: Conjunctivae are normal.  Head: Atraumatic. Nose: No congestion/rhinnorhea. Mouth/Throat: Mucous membranes are moist.   Neck: Normal range of motion.  Cardiovascular: Normal rate, regular rhythm. Good peripheral circulation. Respiratory: Normal respiratory effort.  No retractions.  Gastrointestinal: Soft and nontender. No distention.  Genitourinary: No CVA tenderness. Musculoskeletal: Extremities warm and well perfused.  Neurologic:  Normal speech and language. No gross focal neurologic deficits are appreciated.  Skin:  Skin is warm and dry. No rash noted.  Right mid back biopsy site clean and dry with no surrounding erythema, warmth, or induration. Psychiatric: Mood and affect are normal. Speech and behavior are normal.  ____________________________________________   LABS (all labs ordered are  listed, but only abnormal results are displayed)  Labs Reviewed  COMPREHENSIVE METABOLIC PANEL - Abnormal; Notable for the following components:      Result Value   Sodium 134 (*)    Glucose, Bld 106 (*)    Creatinine, Ser 0.39 (*)    Albumin 3.4 (*)    All other components within normal limits  CBC WITH DIFFERENTIAL/PLATELET - Abnormal; Notable for the following components:   WBC 12.2 (*)    RBC 3.43 (*)    Hemoglobin 10.2 (*)    HCT 32.6 (*)    Platelets 405 (*)    Neutro Abs 9.7 (*)    Monocytes Absolute 1.1 (*)    All other components within normal limits  URINALYSIS, COMPLETE (UACMP) WITH MICROSCOPIC - Abnormal; Notable for the following components:   Color, Urine STRAW (*)    APPearance CLEAR (*)    Specific Gravity, Urine 1.003 (*)    Hgb urine dipstick SMALL (*)    Bacteria, UA RARE (*)    All other components within normal limits  CULTURE, BLOOD (ROUTINE X 2)  CULTURE, BLOOD (ROUTINE X 2)  LACTIC ACID, PLASMA  LACTIC ACID, PLASMA   ____________________________________________  EKG   ____________________________________________  RADIOLOGY  CXR: Right lower lobe lung mass with no acute abnormality CT abdomen: Known right lower lung and adrenal masses.  No acute abnormalities.  ____________________________________________   PROCEDURES  Procedure(s) performed: No  Procedures  Critical Care performed: No ____________________________________________   INITIAL IMPRESSION / ASSESSMENT AND PLAN / ED COURSE  Pertinent labs & imaging results that were available during my care of the patient were reviewed by me and considered in my medical decision making (see chart for details).  58 year old female with PMH as noted above including known lung mass presents with a low-grade fever over the last week, with no significant associated symptoms other than body aches.  The patient states that she has had similar episodes of fever frequently over the last several  years, and this feels similar.  She reports some mild right abdominal pain after a biopsy several days ago but states it felt like it was because of her positioning on the procedure table, and it has now almost completely resolved.  I reviewed the past medical records in [place].  The patient had a negative COVID-19 swab on 09/28/2019.  She had a right adrenal biopsy performed on 55/73 without complication.  On exam today, the patient is well-appearing.  Her vital signs are normal.  She is afebrile currently.  The remainder of the physical exam is unremarkable.  The abdomen is soft nontender.  The biopsy site shows no evidence of infection.  Lab work-up obtained from triage shows mildly elevated WBC count, but otherwise labs are baseline for the patient.  Urinalysis is negative, as is the chest x-ray.  The patient states that she feels relatively well, and would like to go home.  She states that the symptoms are consistent with her prior fever episodes, and given the negative ED work-up, there is no indication for further treatment or observation.  Based on the given history and physical exam, there is no evidence of acute intra-abdominal process.  I discussed the possibility of an abdominal CT with the patient to fully rule this out, but she declines.  She also declines repeat COVID-19 testing.  At this time, the patient is stable for discharge home.  I gave her thorough return precautions and she expressed understanding.  She has a follow-up already arranged with her physician for tomorrow morning.  ----------------------------------------- 9:17 PM on 10/03/2019 -----------------------------------------  On repeat vital signs, the patient's temperature is now 103.2.  I went to talk to her again, because with this higher fever I am more concerned that she could be having an acute infection and it is less likely that this is a benign "episode" like the patient has had before.  I suggested that we in  fact proceed with a CT scan of the abdomen since she still has pain, as well as blood cultures and a lactic acid.  The patient agrees with this plan.  ----------------------------------------- 11:21 PM on 10/03/2019 -----------------------------------------  CT abdomen shows no acute abnormality.  The lactic acid is normal.  The  patient appears much more comfortable and her fever has now resolved.  I offered to the patient to admit her for observation and to await the cultures given the fever of unknown origin.  However, the patient is adamant that she would like to go home.  She feels comfortable, appears well, and again feels that this fever is consistent with episodes that she has had previously.  I gave her thorough return precautions and she expressed understanding.  And she does have an appointment tomorrow morning with her doctor. _________________________________  Janus Molder was evaluated in Emergency Department on 10/03/2019 for the symptoms described in the history of present illness. She was evaluated in the context of the global COVID-19 pandemic, which necessitated consideration that the patient might be at risk for infection with the SARS-CoV-2 virus that causes COVID-19. Institutional protocols and algorithms that pertain to the evaluation of patients at risk for COVID-19 are in a state of rapid change based on information released by regulatory bodies including the CDC and federal and state organizations. These policies and algorithms were followed during the patient's care in the ED.  ____________________________________________   FINAL CLINICAL IMPRESSION(S) / ED DIAGNOSES  Final diagnoses:  Fever of unknown origin      NEW MEDICATIONS STARTED DURING THIS VISIT:  New Prescriptions   No medications on file     Note:  This document was prepared using Dragon voice recognition software and may include unintentional dictation errors.    Arta Silence, MD  10/03/19 5956    Arta Silence, MD 10/03/19 2325

## 2019-10-04 ENCOUNTER — Ambulatory Visit: Payer: Medicare Other | Admitting: Radiation Oncology

## 2019-10-04 ENCOUNTER — Inpatient Hospital Stay: Payer: Medicare Other | Admitting: Oncology

## 2019-10-05 ENCOUNTER — Inpatient Hospital Stay (HOSPITAL_BASED_OUTPATIENT_CLINIC_OR_DEPARTMENT_OTHER): Payer: Medicare Other | Admitting: Oncology

## 2019-10-05 ENCOUNTER — Other Ambulatory Visit: Payer: Self-pay

## 2019-10-05 ENCOUNTER — Encounter: Payer: Self-pay | Admitting: Oncology

## 2019-10-05 ENCOUNTER — Encounter: Payer: Self-pay | Admitting: *Deleted

## 2019-10-05 VITALS — BP 135/64 | HR 72 | Temp 97.1°F | Resp 16 | Wt 134.1 lb

## 2019-10-05 DIAGNOSIS — C7971 Secondary malignant neoplasm of right adrenal gland: Secondary | ICD-10-CM | POA: Diagnosis not present

## 2019-10-05 DIAGNOSIS — Z7189 Other specified counseling: Secondary | ICD-10-CM

## 2019-10-05 DIAGNOSIS — C7972 Secondary malignant neoplasm of left adrenal gland: Secondary | ICD-10-CM

## 2019-10-05 DIAGNOSIS — C7931 Secondary malignant neoplasm of brain: Secondary | ICD-10-CM | POA: Diagnosis not present

## 2019-10-05 DIAGNOSIS — G893 Neoplasm related pain (acute) (chronic): Secondary | ICD-10-CM

## 2019-10-05 DIAGNOSIS — C3431 Malignant neoplasm of lower lobe, right bronchus or lung: Secondary | ICD-10-CM | POA: Diagnosis not present

## 2019-10-05 NOTE — Progress Notes (Signed)
Pt here to get results of MRI and make a plan for pt

## 2019-10-05 NOTE — Progress Notes (Signed)
  Oncology Nurse Navigator Documentation  Navigator Location: [facility]-Med Onc (10/05/19 1300)   )Navigator Encounter Type: Diagnostic Results;Follow-up Appt (10/05/19 1300)     Confirmed Diagnosis Date: 10/03/19 (10/05/19 1300)               Patient Visit Type: MedOnc (10/05/19 1300) Treatment Phase: Pre-Tx/Tx Discussion (10/05/19 1300) Barriers/Navigation Needs: Anxiety;Coordination of Care;Education (10/05/19 1300) Education: Coping with Diagnosis/ Prognosis;Newly Diagnosed Cancer Education;Understanding Cancer/ Treatment Options (10/05/19 1300) Interventions: Coordination of Care;Referrals;Education (10/05/19 1300) Referrals: Radiation Oncology;Other (10/05/19 1300) Coordination of Care: Appts (10/05/19 1300) Education Method: Verbal;Written (10/05/19 1300)      Acuity: Level 3-Moderate Needs (3-4 Barriers Identified) (10/05/19 1300)  met with patient during follow up visit with Dr. Janese Banks to review biopsy results and discuss treatment options. All questions answered during visit. Reassurance provided. Pt given resources regarding diagnosis and supportive services available. Reviewed upcoming appts. Instructed pt to call with any future questions or needs. Pt verbalized understanding. Nothing further needed at this time.        Time Spent with Patient: 60 (10/05/19 1300)

## 2019-10-08 ENCOUNTER — Other Ambulatory Visit: Payer: Self-pay

## 2019-10-08 ENCOUNTER — Encounter: Payer: Self-pay | Admitting: *Deleted

## 2019-10-08 ENCOUNTER — Encounter: Payer: Self-pay | Admitting: Oncology

## 2019-10-08 ENCOUNTER — Encounter: Payer: Self-pay | Admitting: Radiation Oncology

## 2019-10-08 ENCOUNTER — Ambulatory Visit
Admission: RE | Admit: 2019-10-08 | Discharge: 2019-10-08 | Disposition: A | Discharge status: Home / Self Care | Payer: Medicare Other | Source: Ambulatory Visit | Attending: Radiation Oncology | Admitting: Radiation Oncology

## 2019-10-08 VITALS — BP 130/72 | HR 66 | Temp 97.5°F

## 2019-10-08 DIAGNOSIS — F419 Anxiety disorder, unspecified: Secondary | ICD-10-CM | POA: Diagnosis not present

## 2019-10-08 DIAGNOSIS — M129 Arthropathy, unspecified: Secondary | ICD-10-CM | POA: Diagnosis not present

## 2019-10-08 DIAGNOSIS — Z86718 Personal history of other venous thrombosis and embolism: Secondary | ICD-10-CM | POA: Insufficient documentation

## 2019-10-08 DIAGNOSIS — Z800 Family history of malignant neoplasm of digestive organs: Secondary | ICD-10-CM | POA: Diagnosis not present

## 2019-10-08 DIAGNOSIS — C7972 Secondary malignant neoplasm of left adrenal gland: Secondary | ICD-10-CM | POA: Diagnosis not present

## 2019-10-08 DIAGNOSIS — C7971 Secondary malignant neoplasm of right adrenal gland: Secondary | ICD-10-CM | POA: Diagnosis not present

## 2019-10-08 DIAGNOSIS — K219 Gastro-esophageal reflux disease without esophagitis: Secondary | ICD-10-CM | POA: Insufficient documentation

## 2019-10-08 DIAGNOSIS — K1230 Oral mucositis (ulcerative), unspecified: Secondary | ICD-10-CM

## 2019-10-08 DIAGNOSIS — Z803 Family history of malignant neoplasm of breast: Secondary | ICD-10-CM | POA: Diagnosis not present

## 2019-10-08 DIAGNOSIS — Z7901 Long term (current) use of anticoagulants: Secondary | ICD-10-CM | POA: Diagnosis not present

## 2019-10-08 DIAGNOSIS — G7100 Muscular dystrophy, unspecified: Secondary | ICD-10-CM | POA: Insufficient documentation

## 2019-10-08 DIAGNOSIS — Z87891 Personal history of nicotine dependence: Secondary | ICD-10-CM | POA: Diagnosis not present

## 2019-10-08 DIAGNOSIS — Z79899 Other long term (current) drug therapy: Secondary | ICD-10-CM | POA: Insufficient documentation

## 2019-10-08 DIAGNOSIS — C3431 Malignant neoplasm of lower lobe, right bronchus or lung: Secondary | ICD-10-CM | POA: Diagnosis not present

## 2019-10-08 DIAGNOSIS — C7931 Secondary malignant neoplasm of brain: Secondary | ICD-10-CM | POA: Diagnosis present

## 2019-10-08 LAB — CULTURE, BLOOD (ROUTINE X 2)
Culture: NO GROWTH
Culture: NO GROWTH
Special Requests: ADEQUATE

## 2019-10-08 MED ORDER — NYSTATIN 100000 UNIT/ML MT SUSP: 5.0000 mL | Freq: Four times a day (QID) | OROMUCOSAL | 0 refills | Status: DC

## 2019-10-08 MED ORDER — CITALOPRAM HYDROBROMIDE 10 MG PO TABS: 10.0000 mg | ORAL_TABLET | Freq: Every day | ORAL | 0 refills | Status: DC

## 2019-10-08 NOTE — Consult Note (Signed)
NEW PATIENT EVALUATION  Name: Deborah Nguyen  MRN: [id number]  Date:   10/08/2019     DOB: [birth year]   This 58 y.o. female patient presents to the clinic for initial evaluation of multiple brain metastasis and patient with known stage IV squamous cell carcinoma of the lung.  REFERRING PHYSICIAN: Tracie Harrier, MD  CHIEF COMPLAINT:  Chief Complaint  Patient presents with  . Cancer    initial Eval    DIAGNOSIS: The encounter diagnosis was Brain metastases ([place]).   PREVIOUS INVESTIGATIONS:  CT scan PET CT scans and MRI of brain all reviewed Pathology report reviewed Clinical notes reviewed  HPI: Patient is a 58 year old female 50-pack-year smoking history who presented with right flank pain over the past several months.  She will ultimately underwent a CT scan which showed a 4 cm right adrenal mass and a 1.7 cm left adrenal nodule.  She also had a 3.6 x 3.2 cm spiculated mass in the right lower lobe concerning for pulmonary malignancy.  PET CT scan showed a 3.6 cm right lower lobe mass was hypermetabolic along with metastatic disease in right supraclavicular space as well as mediastinum right hilum and bilateral adrenal glands.  She underwent biopsy of her adrenal gland which was positive for squamous cell carcinoma consistent with stage IV lung cancer.  She recently underwent an MRI scan of her brain showed at least 6 tiny brain metastasis in the supratentorial brain.  No significant edema or mass-effect was noted.  Patient has been having some intermittent headaches which seem to have changed over the past several weeks she specifically denies any change in visual fields or any focal neurologic deficits.  She is seen today for consideration of whole brain radiation.  PLANNED TREATMENT REGIMEN: Whole brain radiation  PAST MEDICAL HISTORY:  has a past medical history of Anxiety, Arthritis, Chronic kidney disease, Family history of breast cancer, GERD (gastroesophageal reflux  disease), History of DVT of lower extremity, Lung cancer ([place]), Lung mass, Malignant hyperthermia, 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[place]), and Muscular dystrophy ([place]).    PAST SURGICAL HISTORY:  Past Surgical History:  Procedure Laterality Date  . CESAREAN SECTION  11/1985  . COLONOSCOPY N/A 10/27/2015   Procedure: COLONOSCOPY;  Surgeon: Manya Silvas, MD;  Location: [facility] ENDOSCOPY;  Service: Endoscopy;  Laterality: N/A;   NO Propofol - per office  . LITHOTRIPSY    . TUBAL LIGATION  03/1986    FAMILY HISTORY: family history includes Breast cancer (age of onset: 70) in her maternal aunt; Cancer in her father.  SOCIAL HISTORY:  reports that she quit smoking about 3 years ago. Her smoking use included cigarettes. She smoked 1.00 pack per day. She has never used smokeless tobacco. She reports that she does not drink alcohol or use drugs.  ALLERGIES: Paxil [paroxetine hcl], Ciprofloxacin, Levaquin [levofloxacin], Serzone [nefazodone], Biaxin [clarithromycin], Erythromycin, and Penicillins  MEDICATIONS:  Current Outpatient Medications  Medication Sig Dispense Refill  . acetaminophen (TYLENOL) 325 MG tablet Take 650 mg by mouth every 6 (six) hours as needed.    . ALPRAZolam (XANAX) 0.25 MG tablet Take 0.25 mg by mouth at bedtime as needed for anxiety.    . citalopram (CELEXA) 10 MG tablet Take 1 tablet (10 mg total) by mouth daily. 30 tablet 0  . esomeprazole (NEXIUM) 40 MG capsule Take 40 mg by mouth daily as needed.     . fluticasone (FLONASE) 50 MCG/ACT nasal spray Place 2 sprays into both nostrils daily as needed.     Marland Kitchen  methimazole (TAPAZOLE) 5 MG tablet Take 5 mg by mouth daily.    Marland Kitchen nystatin (MYCOSTATIN) 100000 UNIT/ML suspension Take 5 mLs (500,000 Units total) by mouth 4 (four) times daily. Swish and spit. 300 mL 0  . ondansetron (ZOFRAN-ODT) 4 MG disintegrating tablet Take 1 tablet (4 mg total) by mouth every 8 (eight) hours as needed for nausea or vomiting. 30 tablet 2  . Oxycodone HCl 10  MG TABS Take 1 tablet (10 mg total) by mouth every 4 (four) hours as needed. 60 tablet 0  . warfarin (COUMADIN) 5 MG tablet Take 5 mg by mouth daily. Sat and sun 3 mg     No current facility-administered medications for this encounter.     ECOG PERFORMANCE STATUS:  1 - Symptomatic but completely ambulatory  REVIEW OF SYSTEMS: Patient denies any weight loss, fatigue, weakness, fever, chills or night sweats. Patient denies any loss of vision, blurred vision. Patient denies any ringing  of the ears or hearing loss. No irregular heartbeat. Patient denies heart murmur or history of fainting. Patient denies any chest pain or pain radiating to her upper extremities. Patient denies any shortness of breath, difficulty breathing at night, cough or hemoptysis. Patient denies any swelling in the lower legs. Patient denies any nausea vomiting, vomiting of blood, or coffee ground material in the vomitus. Patient denies any stomach pain. Patient states has had normal bowel movements no significant constipation or diarrhea. Patient denies any dysuria, hematuria or significant nocturia. Patient denies any problems walking, swelling in the joints or loss of balance. Patient denies any skin changes, loss of hair or loss of weight. Patient denies any excessive worrying or anxiety or significant depression. Patient denies any problems with insomnia. Patient denies excessive thirst, polyuria, polydipsia. Patient denies any swollen glands, patient denies easy bruising or easy bleeding. Patient denies any recent infections, allergies or URI. Patient "s visual fields have not changed significantly in recent time.   PHYSICAL EXAM: BP 130/72   Pulse 66   Temp (!) 97.5 F (36.4 C)   LMP 12/15/2010 (Approximate)  Well-developed well-nourished patient in NAD. HEENT reveals PERLA, EOMI, discs not visualized.  Oral cavity is clear. No oral mucosal lesions are identified. Neck is clear without evidence of cervical or  supraclavicular adenopathy. Lungs are clear to A&P. Cardiac examination is essentially unremarkable with regular rate and rhythm without murmur rub or thrill. Abdomen is benign with no organomegaly or masses noted. Motor sensory and DTR levels are equal and symmetric in the upper and lower extremities. Cranial nerves II through XII are grossly intact. Proprioception is intact. No peripheral adenopathy or edema is identified. No motor or sensory levels are noted. Crude visual fields are within normal range.  LABORATORY DATA: Pathology report reviewed    RADIOLOGY RESULTS: CT scans PET CTs and MRI of the brain all reviewed compatible with above-stated findings   IMPRESSION: Present time elected go ahead with whole brain radiation therapy.  Based on the small nature of her lesions believe she also has possible other metastatic sites in the whole brain.  Would not choose based on 6 lesions to offer SRS.  I would plan on delivering 3000 cGy in 12 fractions.  Risks and benefits of whole brain radiation therapy including hair loss fatigue slight chance of cognitive decline skin reaction alteration of blood counts all were discussed in detail with the patient and her family.  They all seem to comprehend my treatment plan well.  I have personally set up and  ordered CT simulation for tomorrow.  We will start the patient off on low-dose Decadron during her treatments.  Patient and her family all seem to comprehend her treatment plan well.  I would also reserve radiation therapy for palliation in the future should any areas such as her lungs or spinal metastasis necessitate palliative treatment.  I would like to take this opportunity to thank you for allowing me to participate in the care of your patient.Marland Kitchen  PLAN: As above  Noreene Filbert, MD

## 2019-10-08 NOTE — Progress Notes (Signed)
  Oncology Nurse Navigator Documentation  Navigator Location: [facility]-Med Onc (10/08/19 1000)   )Navigator Encounter Type: Initial RadOnc (10/08/19 1000)                         Barriers/Navigation Needs: Coordination of Care;Anxiety (10/08/19 1000)   Interventions: Coordination of Care;Psycho-Social Support (10/08/19 1000)   Coordination of Care: Appts (10/08/19 1000)         met with patient during initial rad-onc consultation with Dr. Baruch Gouty. All questions answered during visit. Reassurance provided. Pt stated is having difficulty eating due to mouth soreness and mouth sores. Per Dr. Janese Banks, will send in prescription for nystatin swish and spit. Also, pt asking if Dr. Janese Banks could send in prescription to help with her anxiety. Per Dr. Janese Banks, will send in prescription for citalopram '10mg'$  tablets to start at first and in 2 weeks will reassess response/tolerance. Reviewed upcoming appts with patient and informed at her next visit for simulation her family will not be able to go back to the treatment area. Pt made aware that she will have support from staff available to assist her during the visit. Pt verbalized understanding. Nothing further needed at this time.          Time Spent with Patient: 60 (10/08/19 1000)

## 2019-10-08 NOTE — Progress Notes (Signed)
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  She presented to Dr. Roselee Culver with symptoms of right flank pain which was radiating to her front which prompted a CT abdomen.  CT abdomen pelvis with contrast showed a 3.7 x 2.8 x 5 cm right adrenal mass.  1.7 x 1.2 x 2.1 cm left adrenal nodule 1.8 cm right renal simple cyst.  And a new 3.6 x 3.2 cm spiculated mass in the right lower lobe with tethering of the adjacent pleura.  Prominent hilar lymph nodes.  This was followed by a PET CT scan on 09/20/2019 which showed a 3.6 cm right lower lobe mass which was markedly hypermetabolic along with hypermetabolic metastatic disease in the right supraclavicular space as well as mediastinum and right hilum and hypermetabolic metastatic disease in bilateral adrenal glands.  She will also noted to have bilateral hypermetabolic thyroid nodules and a possible nodule in the pelvis adjacent to the sigmoid colon.  Patient currently reports ongoing pain in her right flank which is relatively well controlled with hydrocodone.  She also has occasional nausea for which she has as needed nausea medications.  She is here with her twin sister today and is highly anxious Of note patient has a history of central core myopathy and follows up with neuromuscular clinic at [facility].  Patient states that her twin sister has had episodes of hyperthermia but patient herself has not  had these episodes.  I did review [facility] neuromuscular clinic note and the hyperthermia has been attributed to her history of hyperthyroidism and thyroid dysfunction rather than malignant hyperthermia.  However given her history of myopathy patient does carry risk of malignant hyperthermia.  Interval history-patient has been having on and off fevers over the last 6 years from unclear etiology.  Reports that her fevers can be as high as 102 at times.  She has had a recent Covid test that was negative.  She was also seen in the ER and urinalysis and blood culture was negative She still has ongoing right flank pain for which she is using hydrocodone   ECOG PS- 1 Pain scale- 0 Opioid associated constipation- no  Review of systems- Review of Systems  Constitutional: Positive for fever and malaise/fatigue. Negative for chills and weight loss.  HENT: Negative for congestion, ear discharge and nosebleeds.   Eyes: Negative for blurred vision.  Respiratory: Negative for cough, hemoptysis, sputum production, shortness of breath and wheezing.   Cardiovascular: Negative for chest pain, palpitations, orthopnea and claudication.  Gastrointestinal: Negative for abdominal pain, blood in stool, constipation, diarrhea, heartburn, melena, nausea and vomiting.       Right flank pain  Genitourinary: Negative for dysuria, flank pain, frequency, hematuria and urgency.  Musculoskeletal: Negative for back pain, joint pain and myalgias.  Skin: Negative for rash.  Neurological: Negative for dizziness, tingling, focal weakness, seizures, weakness and headaches.  Endo/Heme/Allergies: Does not bruise/bleed easily.  Psychiatric/Behavioral: Negative for depression and suicidal ideas. The patient is nervous/anxious. The patient does  not have insomnia.        Allergies  Allergen Reactions   Paxil [Paroxetine Hcl] Other (See Comments)    Hallucinations    Ciprofloxacin Other (See Comments)    Unknown   Levaquin  [Levofloxacin] Other (See Comments)    Unknown    Serzone [Nefazodone] Other (See Comments)    Unknown    Biaxin [Clarithromycin] Nausea Only   Erythromycin Nausea Only   Penicillins Rash     Past Medical History:  Diagnosis Date   Anxiety    Arthritis    Chronic kidney disease    Family history of breast cancer    aunt   GERD (gastroesophageal reflux disease)    History of DVT of lower extremity    Left leg   Lung cancer ([place])    Lung mass    Malignant hyperthermia    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[place])    mild form - per patient central cord disease   Muscular dystrophy [facility])      Past Surgical History:  Procedure Laterality Date   CESAREAN SECTION  11/1985   COLONOSCOPY N/A 10/27/2015   Procedure: COLONOSCOPY;  Surgeon: Manya Silvas, MD;  Location: [place];  Service: Endoscopy;  Laterality: N/A;   NO Propofol - per office   LITHOTRIPSY     TUBAL LIGATION  03/1986    Social History   Socioeconomic History   Marital status: Divorced    Spouse name: Not on file   Number of children: Not on file   Years of education: Not on file   Highest education level: Not on file  Occupational History   Not on file  Social Needs   Financial resource strain: Not on file   Food insecurity    Worry: Not on file    Inability: Not on file   Transportation needs    Medical: Not on file    Non-medical: Not on file  Tobacco Use   Smoking status: Former Smoker    Packs/day: 1.00    Types: Cigarettes    Quit date: 05/2016    Years since quitting: 3.4   Smokeless tobacco: Never Used  Substance and Sexual Activity   Alcohol use: No   Drug use: No   Sexual activity: Not on file  Lifestyle   Physical activity    Days per week: Not on file    Minutes per session: Not on file   Stress: Not on file  Relationships   Social connections    Talks on phone: Not on file    Gets together: Not on file    Attends religious service: Not on  file    Active member of club or organization: Not on file    Attends meetings of clubs or organizations: Not on file    Relationship status: Not on file   Intimate partner violence    Fear of current or ex partner: Not on file    Emotionally abused: Not on file    Physically abused: Not on file    Forced sexual activity: Not on file  Other Topics Concern   Not on file  Social History Narrative   Not on file    Family History  Problem Relation Age of Onset   Cancer Father        bladder, lung   Breast cancer Maternal Aunt 45     Current Outpatient Medications:    acetaminophen (TYLENOL) 325 MG tablet, Take 650 mg by mouth every 6 (  six) hours as needed., Disp: , Rfl:    ALPRAZolam (XANAX) 0.25 MG tablet, Take 0.25 mg by mouth at bedtime as needed for anxiety., Disp: , Rfl:    esomeprazole (NEXIUM) 40 MG capsule, Take 40 mg by mouth daily as needed. , Disp: , Rfl:    fluticasone (FLONASE) 50 MCG/ACT nasal spray, Place 2 sprays into both nostrils daily as needed. , Disp: , Rfl:    methimazole (TAPAZOLE) 5 MG tablet, Take 5 mg by mouth daily., Disp: , Rfl:    ondansetron (ZOFRAN-ODT) 4 MG disintegrating tablet, Take 1 tablet (4 mg total) by mouth every 8 (eight) hours as needed for nausea or vomiting., Disp: 30 tablet, Rfl: 2   Oxycodone HCl 10 MG TABS, Take 1 tablet (10 mg total) by mouth every 4 (four) hours as needed., Disp: 60 tablet, Rfl: 0   warfarin (COUMADIN) 5 MG tablet, Take 5 mg by mouth daily. Sat and sun 3 mg, Disp: , Rfl:    citalopram (CELEXA) 10 MG tablet, Take 1 tablet (10 mg total) by mouth daily., Disp: 30 tablet, Rfl: 0   nystatin (MYCOSTATIN) 100000 UNIT/ML suspension, Take 5 mLs (500,000 Units total) by mouth 4 (four) times daily. Swish and spit., Disp: 300 mL, Rfl: 0  Physical exam:  Vitals:   10/05/19 1147 10/05/19 1148  BP: 135/64   Pulse: 72   Resp: 16   Temp:  (!) 97.1 F (36.2 C)  TempSrc:  Tympanic  Weight: 134 lb 1.6 oz (60.8 kg)      Physical Exam Constitutional:      General: She is not in acute distress.    Comments: Patient is highly anxious  HENT:     Head: Normocephalic and atraumatic.  Eyes:     Pupils: Pupils are equal, round, and reactive to light.  Neck:     Musculoskeletal: Normal range of motion.  Cardiovascular:     Rate and Rhythm: Normal rate and regular rhythm.     Heart sounds: Normal heart sounds.  Pulmonary:     Effort: Pulmonary effort is normal.     Breath sounds: Normal breath sounds.  Abdominal:     General: Bowel sounds are normal.     Palpations: Abdomen is soft.  Skin:    General: Skin is warm and dry.  Neurological:     Mental Status: She is alert and oriented to person, place, and time.      CMP Latest Ref Rng & Units 10/03/2019  Glucose 70 - 99 mg/dL 106(H)  BUN 6 - 20 mg/dL 12  Creatinine 0.44 - 1.00 mg/dL 0.39(L)  Sodium 135 - 145 mmol/L 134(L)  Potassium 3.5 - 5.1 mmol/L 4.1  Chloride 98 - 111 mmol/L 98  CO2 22 - 32 mmol/L 26  Calcium 8.9 - 10.3 mg/dL 8.9  Total Protein 6.5 - 8.1 g/dL 8.0  Total Bilirubin 0.3 - 1.2 mg/dL 0.3  Alkaline Phos 38 - 126 U/L 79  AST 15 - 41 U/L 17  ALT 0 - 44 U/L 15   CBC Latest Ref Rng & Units 10/03/2019  WBC 4.0 - 10.5 K/uL 12.2(H)  Hemoglobin 12.0 - 15.0 g/dL 10.2(L)  Hematocrit 36.0 - 46.0 % 32.6(L)  Platelets 150 - 400 K/uL 405(H)    No images are attached to the encounter.  Dg Chest 2 View  Result Date: 10/03/2019 CLINICAL DATA:  Fever of unknown origin, history of lung mass EXAM: CHEST - 2 VIEW COMPARISON:  CT 09/13/2019 FINDINGS: Right lower lobe  lung mass. Normal heart size. No pneumothorax. Mild superior endplate deformity at I68 again noted. IMPRESSION: Right lower lobe lung mass.  Negative for acute pulmonary opacity. Electronically Signed   By: Donavan Foil M.D.   On: 10/03/2019 18:12   Mr Jeri Cos EH Contrast  Result Date: 09/26/2019 CLINICAL DATA:  Staging floor are new diagnosis lung cancer. EXAM: MRI HEAD  WITHOUT AND WITH CONTRAST TECHNIQUE: Multiplanar, multiecho pulse sequences of the brain and surrounding structures were obtained without and with intravenous contrast. CONTRAST:  66m GADAVIST GADOBUTROL 1 MMOL/ML IV SOLN COMPARISON:  05/25/2017 FINDINGS: Brain: 6 tiny supratentorial brain metastases are identified. 3 mm metastasis lateral left temporal lobe axial image 50. 3 mm right temporal metastasis axial image 61. 2 mm metastasis left temporal white matter axial image 63. 4 mm metastasis left parietooccipital junction axial image 76. 2 mm metastasis left posterior basal ganglia/radiating white matter tracts axial image 87. 3 mm metastasis right parietal cortex axial image 92. None are associated with mass effect, edema or hemorrhage. The brain otherwise appears normal except for a few punctate white matter foci consistent with minimal small vessel change. No hydrocephalus. No extra-axial collection. Vascular: Major vessels at the base of the brain show flow. Skull and upper cervical spine: Negative Sinuses/Orbits: Clear/normal Other: None IMPRESSION: Six tiny brain metastases as outlined above, all affecting the supratentorial brain. No edema, mass effect or hemorrhage. Electronically Signed   By: MNelson ChimesM.D.   On: 09/26/2019 14:42   Ct Abdomen Pelvis W Contrast  Result Date: 10/03/2019 CLINICAL DATA:  Upper abdominal pain and fevers, history of recent right adrenal biopsy EXAM: CT ABDOMEN AND PELVIS WITH CONTRAST TECHNIQUE: Multidetector CT imaging of the abdomen and pelvis was performed using the standard protocol following bolus administration of intravenous contrast. CONTRAST:  1043mOMNIPAQUE IOHEXOL 300 MG/ML  SOLN COMPARISON:  09/13/2019 FINDINGS: Lower chest: Stable right lower lobe mass lesion is noted suspicious for primary pulmonary neoplasm. Hepatobiliary: No focal liver abnormality is seen. No gallstones, gallbladder wall thickening, or biliary dilatation. Pancreas: Unremarkable. No  pancreatic ductal dilatation or surrounding inflammatory changes. Spleen: Normal in size without focal abnormality. Adrenals/Urinary Tract: Adrenal glands demonstrate lesions bilaterally right slightly greater than left. The overall appearance of the lesions is stable from the prior CT examination consistent with metastatic disease as previously proven with biopsy. The kidneys demonstrate a normal enhancement pattern. No renal calculi or obstructive changes are seen. Stable right renal cyst is noted. No obstructive changes are noted. The bladder is well distended. Stomach/Bowel: Fecal material is noted throughout the colon without obstructive change. The appendix is well visualized and within normal limits. The stomach and small bowel demonstrate no focal abnormality. Vascular/Lymphatic: Aortic atherosclerosis. No enlarged abdominal or pelvic lymph nodes. Reproductive: Uterus is within normal limits. Small 15 mm cyst is noted within the right ovary. The left adnexal region appears within normal limits. Other: No abdominal wall hernia or abnormality. No abdominopelvic ascites. Musculoskeletal: Degenerative changes of the lumbar spine are noted. Stable vertebral body height loss at T12 is noted. No acute bony abnormality is seen. IMPRESSION: Right lower lobe lung mass with biopsy-proven adrenal metastatic disease. No acute abnormality is noted to account for the current clinical history. Chronic changes as described above. Electronically Signed   By: MaInez Catalina.D.   On: 10/03/2019 23:05   Ct Abdomen Pelvis W Contrast  Result Date: 09/13/2019 CLINICAL DATA:  Right lower quadrant pain for the past 2 weeks. EXAM: CT ABDOMEN AND  PELVIS WITH CONTRAST TECHNIQUE: Multidetector CT imaging of the abdomen and pelvis was performed using the standard protocol following bolus administration of intravenous contrast. CONTRAST:  33m OMNIPAQUE IOHEXOL 300 MG/ML  SOLN COMPARISON:  CT abdomen pelvis dated November 08, 2013.  FINDINGS: Lower chest: New 3.6 x 3.2 cm spiculated mass in the right lower lobe with tethering of the adjacent pleura. Prominent right hilar lymph node measuring 1.0 cm in short axis. Hepatobiliary: No focal liver abnormality is seen. No gallstones, gallbladder wall thickening, or biliary dilatation. Pancreas: Unremarkable. No pancreatic ductal dilatation or surrounding inflammatory changes. Spleen: Normal in size without focal abnormality. Adrenals/Urinary Tract: New 3.7 x 2.8 x 5.0 cm hypodense right adrenal mass. New 1.7 x 1.2 x 2.1 cm hypodense left adrenal nodule. 4.8 cm right renal simple cyst, increased in size since the prior study. No renal calculi or hydronephrosis. The bladder is unremarkable. Stomach/Bowel: Stomach is within normal limits. Appendix appears normal. No evidence of bowel wall thickening, distention, or inflammatory changes. Vascular/Lymphatic: No significant vascular findings are present. No enlarged abdominal or pelvic lymph nodes. Reproductive: Uterus and bilateral adnexa are unremarkable. Other: No abdominal wall hernia or abnormality. No abdominopelvic ascites. No pneumoperitoneum. Musculoskeletal: No acute or significant osseous findings. Mild diffuse muscle atrophy. Chronic mild T12 superior endplate compression deformity. IMPRESSION: 1.  No acute intra-abdominal process. 2. New 3.6 x 3.2 cm spiculated mass in the right lower lobe with tethering of the adjacent pleura, highly concerning for primary bronchogenic carcinoma. Consultation with pulmonary medicine or thoracic surgery is suggested, as clinically appropriate. 3. New bilateral adrenal masses measuring up to 5.0 cm on the right, consistent with metastatic disease. These results will be called to the ordering clinician or representative by the Radiologist Assistant, and communication documented in the PACS or zVision Dashboard. Electronically Signed   By: WTitus DubinM.D.   On: 09/13/2019 17:50   Nm Pet Image Initial (pi)  Skull Base To Thigh  Result Date: 09/20/2019 CLINICAL DATA:  Initial treatment strategy for lung mass. EXAM: NUCLEAR MEDICINE PET SKULL BASE TO THIGH TECHNIQUE: 6.7 mCi F-18 FDG was injected intravenously. Full-ring PET imaging was performed from the skull base to thigh after the radiotracer. CT data was obtained and used for attenuation correction and anatomic localization. Fasting blood glucose: 80 mg/dl COMPARISON:  Abdomen/pelvis CT 09/13/2019. FINDINGS: Mediastinal blood pool activity: SUV max 3.0 Liver activity: SUV max NA NECK: 5 mm short axis right supraclavicular node (440/3 is hypermetabolic with SUV max = 147.4 Hypermetabolic nodules are identified in the thyroid parenchyma bilaterally. Incidental CT findings: none CHEST: 3.6 cm right lower lobe pulmonary mass is markedly hypermetabolic with SUV max = 125.9 Hypermetabolic mediastinal lymphadenopathy evident. *8 mm short axis high paraesophageal node on 52/3 demonstrates SUV max = 4.2. *8 mm retrotracheal node on 62/3 demonstrates SUV max = 7.3. *1.3 cm short axis subcarinal node is hypermetabolic with SUV max = 156.3 *1.1 cm right hilar node is hypermetabolic with SUV max = 187.5 Incidental CT findings: Centrilobular emphsyema noted. Atherosclerotic calcification is noted in the wall of the thoracic aorta. No evidence for pleural effusion. There is a tiny 2 mm right lower lobe nodule (75/3) too small to characterize and below threshold for resolution on PET imaging. ABDOMEN/PELVIS: 4 cm right adrenal mass shows marked hypermetabolism consistent with metastatic involvement ( SUV max = 16.6. Smaller 2.1 cm left adrenal metastasis is also hypermetabolic. Focus of hypermetabolism in the central pelvis maps to an apparent sigmoid diverticulum (195/3) although this could  be a pericolonic lymph node. Activity in the right colon is presumably physiologic. Incidental CT findings: 4.5 cm cyst identified interpolar right kidney anteriorly. Left colonic  diverticulosis evident without CT findings of diverticulitis. SKELETON: No focal hypermetabolic activity to suggest skeletal metastasis. Incidental CT findings: Prominent muscular atrophy noted throughout consistent with reported history of muscular dystrophy. IMPRESSION: 1. 3.6 cm right lower lobe pulmonary mass is markedly hypermetabolic consistent with primary bronchogenic neoplasm. There is associated hypermetabolic metastatic disease in the right supraclavicular space, mediastinum, right hilum, and both adrenal glands. Hypermetabolic nodule in the pelvis adjacent to the sigmoid colon is probably a diverticulum although a small hypermetabolic pericolonic lymph node could have this appearance. Attention on follow-up recommended. 2. Hypermetabolic bilateral thyroid nodules not well evaluated on noncontrast CT. Thyroid ultrasound may prove helpful to further evaluate or attention on follow-up could be used to assess stability. 3.  Aortic Atherosclerois (ICD10-170.0) Electronically Signed   By: Misty Stanley M.D.   On: 09/20/2019 14:03   Ct Biopsy  Result Date: 10/01/2019 CLINICAL DATA:  Right lower lobe lung mass, bilateral adrenal masses and metastatic mediastinal lymphadenopathy. The patient presents for biopsy of the right adrenal mass. EXAM: CT GUIDED CORE BIOPSY OF RIGHT ADRENAL MASS ANESTHESIA/SEDATION: 4.0 mg IV Versed; 100 mcg IV Fentanyl Total Moderate Sedation Time:  23 minutes. The patient's level of consciousness and physiologic status were continuously monitored during the procedure by Radiology nursing. PROCEDURE: The procedure risks, benefits, and alternatives were explained to the patient. Questions regarding the procedure were encouraged and answered. The patient understands and consents to the procedure. A time-out was performed prior to initiating the procedure. CT was performed in a prone position followed by an oblique position with the left side raised slightly. The right flank region was  prepped with chlorhexidine in a sterile fashion, and a sterile drape was applied covering the operative field. A sterile gown and sterile gloves were used for the procedure. Local anesthesia was provided with 1% Lidocaine. Under CT guidance, a 17 gauge trocar needle was advanced to the level of a right adrenal mass. After confirming needle tip position, 2 separate coaxial 18 gauge core biopsy samples were obtained. Samples were submitted in formalin. Additional CT imaging was performed prior to and following outer needle removal. COMPLICATIONS: None FINDINGS: The right adrenal gland is enlarged and engulfed by a mass measuring approximately 3.4 x 4.3 cm. Solid tissue was obtained with biopsy. At the right lung base, note is again made of a lobulated right lower lobe lung mass measuring approximately 4.4 cm. IMPRESSION: CT-guided core biopsy performed of right adrenal mass. Electronically Signed   By: Aletta Edouard M.D.   On: 10/01/2019 12:55     Assessment and plan- Patient is a 58 y.o. female with newly diagnosed squamous cell carcinoma of the lung stage IV [id number] with adrenal and brain metastases go to discuss further management  I have reviewed MRI brain images independently and discussed findings with the patient.  She was found to have multiple subcentimeter Brain metastases that were 6 in number.  Discussed that she would need palliative whole brain radiation for this and she will be seeing Dr. Donella Stade next week.  Discussed the findings of her renal biopsy which was consistent with squamous cell lung cancer.  She has stage IV disease and therefore treatment will be palliative and not curative.  We do not have enough sample to send extensive invasive testing and therefore we will send limited testing for EGFR, ALK, ROS,  MET 1, BRAF and PD-L1 testing.  I will tentatively see the patient back in 2 weeks time after the results of the tests are back to see if she would be a candidate for targeted  treatment versus if we need to proceed with systemic chemoimmunotherapy.  In the absence of targeted mutation I would recommend palliative chemotherapy with carboplatin and Taxol and Keytruda every 3 weeks for 4 cycles followed by maintenance Keytruda until progression or toxicity.  Discussed risks and benefits of chemotherapy including all but not limited to nausea, vomiting, low blood counts, risk of infections and hospitalization.  Hair loss, peripheral neuropathy and possible infusion reaction associated with Taxol.  Treatment will be given with a palliative intent.  Patient understands and agrees to proceed  Anxiety: This has been a chronic issue which has been made worse with the diagnosis of lung cancer.  I did give her an option to start citalopram to see if it would help control her anxiety.  Patient will let us know  Neoplasm associated pain: Patient does have right flank pain likely secondary to adrenal metastases.  She will continue as needed hydrocodone for this  Fevers: Unclear if it is related to her history of possible malignant hyperthermia.  No infectious work-up positive so far.  Continue to monitor  Cancer Staging Malignant neoplasm of lower lobe of right lung [facility]) Staging form: Lung, AJCC 8th Edition - Clinical stage from 10/05/2019: Stage IV (cT2, cN2, cM1) - Signed by Sindy Guadeloupe, MD on 10/08/2019     Visit Diagnosis 1. 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 Malignant neoplasm of lower lobe of right lung ([place])   3. Goals of care, counseling/discussion   4. Brain metastases ([place])   5. Neoplasm related pain      Dr. Randa Evens, MD, MPH [facility] [phone] 10/08/2019 12:33 PM

## 2019-10-09 ENCOUNTER — Other Ambulatory Visit: Payer: Self-pay

## 2019-10-09 ENCOUNTER — Ambulatory Visit
Admission: RE | Admit: 2019-10-09 | Discharge: 2019-10-09 | Disposition: A | Discharge status: Home / Self Care | Payer: Medicare Other | Source: Ambulatory Visit | Attending: Radiation Oncology | Admitting: Radiation Oncology

## 2019-10-09 ENCOUNTER — Other Ambulatory Visit: Payer: Self-pay | Admitting: *Deleted

## 2019-10-09 ENCOUNTER — Encounter: Payer: Self-pay | Admitting: *Deleted

## 2019-10-09 ENCOUNTER — Encounter: Payer: Self-pay | Admitting: Oncology

## 2019-10-09 DIAGNOSIS — C3431 Malignant neoplasm of lower lobe, right bronchus or lung: Secondary | ICD-10-CM | POA: Diagnosis not present

## 2019-10-09 DIAGNOSIS — Z510 Encounter for antineoplastic radiation therapy: Secondary | ICD-10-CM | POA: Insufficient documentation

## 2019-10-09 DIAGNOSIS — C7931 Secondary malignant neoplasm of brain: Secondary | ICD-10-CM | POA: Insufficient documentation

## 2019-10-09 MED ORDER — DEXAMETHASONE 2 MG PO TABS: 2.0000 mg | ORAL_TABLET | Freq: Two times a day (BID) | ORAL | 0 refills | Status: DC

## 2019-10-09 NOTE — Progress Notes (Signed)
  Oncology Nurse Navigator Documentation  Navigator Location: [facility] (10/09/19 1100)   )Navigator Encounter Type: Lobby (10/09/19 1100)                     Patient Visit Type: [facility] (10/09/19 1100) Treatment Phase: CT SIM (10/09/19 1100)     Interventions: Psycho-Social Support (10/09/19 1100)          Specialty Items/DME: Wigs (10/09/19 1100)    met with patient throughout duration of CT simulation to provide support while caregiver could not be present. Reassurance provided and answered all questions. Assisted pt to gift shop to pick out a wig after CT sim completed. Reviewed upcoming appts and informed that steroid will be sent into the pharmacy that she will start taking on the first day of radiation (11/10). Instructed pt to call back with any further questions or needs. Pt verbalized understanding. Nothing further needed at this time.        Time Spent with Patient: 60 (10/09/19 1100)

## 2019-10-11 ENCOUNTER — Other Ambulatory Visit: Payer: Medicare Other

## 2019-10-11 ENCOUNTER — Encounter: Payer: Self-pay | Admitting: Oncology

## 2019-10-11 NOTE — Progress Notes (Signed)
Tumor Board Documentation  Deborah Nguyen was presented by Dr Janese Banks at our Tumor Board on 10/11/2019, which included representatives from medical oncology, radiation oncology, pathology, radiology, surgical, navigation, internal medicine, pharmacy, pulmonology, palliative care, research.  Deborah Nguyen currently presents as a current patient, for [employer], for new positive pathology with history of the following treatments: surgical intervention(s), active survellience.  Additionally, we reviewed previous medical and familial history, history of present illness, and recent lab results along with all available histopathologic and imaging studies. The tumor board considered available treatment options and made the following recommendations: Immunotherapy, Chemotherapy Brain radiation Therapy, Pt will need Ultrasound of her thyroid  The following procedures/referrals were also placed: No orders of the defined types were placed in this encounter.   Clinical Trial Status: not discussed   Staging used: Clinical Stage  AJCC Staging: T: 2 N: 3 M: 1 Group: Squamous Cell Lung Cancer   National site-specific guidelines NCCN were discussed with respect to the case.  Tumor board is a meeting of clinicians from various specialty areas who evaluate and discuss patients for whom a multidisciplinary approach is being considered. Final determinations in the plan of care are those of the provider(s). The responsibility for follow up of recommendations given during tumor board is that of the provider.   Today's extended care, comprehensive team conference, Deborah Nguyen was not present for the discussion and was not examined.   Multidisciplinary Tumor Board is a multidisciplinary case peer review process.  Decisions discussed in the Multidisciplinary Tumor Board reflect the opinions of the specialists present at the conference without having examined the patient.  Ultimately, treatment and diagnostic decisions rest with the  primary provider(s) and the patient.

## 2019-10-12 ENCOUNTER — Other Ambulatory Visit: Payer: Self-pay | Admitting: *Deleted

## 2019-10-12 DIAGNOSIS — C7931 Secondary malignant neoplasm of brain: Secondary | ICD-10-CM

## 2019-10-15 ENCOUNTER — Encounter: Payer: Self-pay | Admitting: Hospice and Palliative Medicine

## 2019-10-15 ENCOUNTER — Encounter: Payer: Self-pay | Admitting: Oncology

## 2019-10-15 ENCOUNTER — Ambulatory Visit
Admission: RE | Admit: 2019-10-15 | Discharge: 2019-10-15 | Disposition: A | Discharge status: Home / Self Care | Payer: Medicare Other | Source: Ambulatory Visit | Attending: Radiation Oncology | Admitting: Radiation Oncology

## 2019-10-15 ENCOUNTER — Inpatient Hospital Stay: Payer: Medicare Other | Attending: Hospice and Palliative Medicine | Admitting: Hospice and Palliative Medicine

## 2019-10-15 ENCOUNTER — Other Ambulatory Visit: Payer: Self-pay

## 2019-10-15 VITALS — BP 123/68 | HR 60 | Temp 97.1°F | Resp 16 | Wt 132.6 lb

## 2019-10-15 DIAGNOSIS — Z66 Do not resuscitate: Secondary | ICD-10-CM | POA: Insufficient documentation

## 2019-10-15 DIAGNOSIS — C7931 Secondary malignant neoplasm of brain: Secondary | ICD-10-CM | POA: Insufficient documentation

## 2019-10-15 DIAGNOSIS — Z23 Encounter for immunization: Secondary | ICD-10-CM | POA: Insufficient documentation

## 2019-10-15 DIAGNOSIS — F419 Anxiety disorder, unspecified: Secondary | ICD-10-CM | POA: Diagnosis not present

## 2019-10-15 DIAGNOSIS — R110 Nausea: Secondary | ICD-10-CM | POA: Insufficient documentation

## 2019-10-15 DIAGNOSIS — Z515 Encounter for palliative care: Secondary | ICD-10-CM | POA: Diagnosis not present

## 2019-10-15 DIAGNOSIS — C781 Secondary malignant neoplasm of mediastinum: Secondary | ICD-10-CM | POA: Insufficient documentation

## 2019-10-15 DIAGNOSIS — C3431 Malignant neoplasm of lower lobe, right bronchus or lung: Secondary | ICD-10-CM | POA: Diagnosis not present

## 2019-10-15 DIAGNOSIS — Z87891 Personal history of nicotine dependence: Secondary | ICD-10-CM | POA: Diagnosis not present

## 2019-10-15 DIAGNOSIS — G7100 Muscular dystrophy, unspecified: Secondary | ICD-10-CM | POA: Diagnosis not present

## 2019-10-15 DIAGNOSIS — K219 Gastro-esophageal reflux disease without esophagitis: Secondary | ICD-10-CM | POA: Insufficient documentation

## 2019-10-15 DIAGNOSIS — Z79899 Other long term (current) drug therapy: Secondary | ICD-10-CM | POA: Insufficient documentation

## 2019-10-15 DIAGNOSIS — G7129 Other congenital myopathy: Secondary | ICD-10-CM | POA: Diagnosis not present

## 2019-10-15 DIAGNOSIS — R531 Weakness: Secondary | ICD-10-CM

## 2019-10-15 DIAGNOSIS — R51 Headache with orthostatic component, not elsewhere classified: Secondary | ICD-10-CM | POA: Diagnosis not present

## 2019-10-15 DIAGNOSIS — Z7189 Other specified counseling: Secondary | ICD-10-CM

## 2019-10-15 DIAGNOSIS — G893 Neoplasm related pain (acute) (chronic): Secondary | ICD-10-CM | POA: Diagnosis not present

## 2019-10-15 DIAGNOSIS — Z510 Encounter for antineoplastic radiation therapy: Secondary | ICD-10-CM | POA: Diagnosis not present

## 2019-10-15 DIAGNOSIS — K5900 Constipation, unspecified: Secondary | ICD-10-CM | POA: Diagnosis not present

## 2019-10-15 DIAGNOSIS — F32A Depression, unspecified: Secondary | ICD-10-CM

## 2019-10-15 DIAGNOSIS — F329 Major depressive disorder, single episode, unspecified: Secondary | ICD-10-CM | POA: Diagnosis not present

## 2019-10-15 DIAGNOSIS — C7972 Secondary malignant neoplasm of left adrenal gland: Secondary | ICD-10-CM | POA: Diagnosis not present

## 2019-10-15 DIAGNOSIS — C7971 Secondary malignant neoplasm of right adrenal gland: Secondary | ICD-10-CM | POA: Diagnosis not present

## 2019-10-15 DIAGNOSIS — J029 Acute pharyngitis, unspecified: Secondary | ICD-10-CM | POA: Diagnosis not present

## 2019-10-15 MED ORDER — ONDANSETRON 4 MG PO TBDP: 4.0000 mg | ORAL_TABLET | Freq: Three times a day (TID) | ORAL | 2 refills | Status: DC | PRN

## 2019-10-15 MED ORDER — OXYCODONE HCL 10 MG PO TABS: 10.0000 mg | ORAL_TABLET | ORAL | 0 refills | Status: DC | PRN

## 2019-10-15 MED ORDER — CITALOPRAM HYDROBROMIDE 20 MG PO TABS: 20.0000 mg | ORAL_TABLET | Freq: Every day | ORAL | 2 refills | Status: DC

## 2019-10-15 MED ORDER — PROCHLORPERAZINE MALEATE 10 MG PO TABS: 5.0000 mg | ORAL_TABLET | Freq: Four times a day (QID) | ORAL | 2 refills | Status: DC | PRN

## 2019-10-15 NOTE — Progress Notes (Signed)
[place]  Telephone:([phone] [phone]   Name: Deborah Nguyen Date: 10/15/2019 MRN: [id number]  DOB: Jun 09, 1961  Patient Care Team: Tracie Harrier, MD as PCP - General (Internal Medicine) Telford Nab, RN as Registered Nurse    REASON FOR CONSULTATION: Palliative Care consult requested for this 58 y.o. 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  PMH is also notable for muscular dystrophy followed by neuromuscular clinic at [facility], anxiety, history of hypothyroidism, recurrent hyperthermia, and history of lower extremity DVT.  Patient was diagnosed with stage IV lung cancer after having right flank pain over several months.  She ultimately underwent PET/CT on 09/20/2019 with findings of hypermetabolic mass in the right lower lobe, supraclavicular/mediastinal/right hilar lymph nodes, and bilateral adrenal glands.  Patient was also found to have several brain masses on MRI.  Patient is receiving whole brain radiation.  There has been discussion about systemic treatment.  Palliative care was consulted to help address goals and manage ongoing symptoms.  SOCIAL HISTORY:     reports that she quit smoking about 3 years ago. Her smoking use included cigarettes. She smoked 1.00 pack per day. She has never used smokeless tobacco. She reports that she does not drink alcohol or use drugs.   Patient is unmarried.  She lives at home in an apartment with her twin sister as her primary caregiver.  Patient has a son and two grandchildren.  Patient is disabled from her muscular dystrophy but worked in a [employer] earlier in life.  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   Muscular dystrophy ([place])     PAST SURGICAL HISTORY:  Past Surgical History:  Procedure Laterality Date   CESAREAN SECTION  11/1985   COLONOSCOPY N/A 10/27/2015   Procedure: COLONOSCOPY;  Surgeon: Manya Silvas, MD;  Location: [place];  Service: Endoscopy;  Laterality: N/A;   NO Propofol - per office   LITHOTRIPSY     TUBAL LIGATION  03/1986    HEMATOLOGY/ONCOLOGY HISTORY:  Oncology History  Malignant neoplasm of lower lobe of right lung ([place])  09/20/2019 Initial Diagnosis   Malignant neoplasm of lower lobe of right lung ([place])   10/05/2019 Cancer Staging   Staging form: Lung, AJCC 8th Edition - Clinical stage from 10/05/2019: Stage IV (cT2, cN2, cM1) - Signed by Sindy Guadeloupe, MD on 10/08/2019     ALLERGIES:  is allergic to paxil [paroxetine hcl]; ciprofloxacin; levaquin [levofloxacin]; serzone [nefazodone]; biaxin [clarithromycin]; erythromycin; and penicillins.  MEDICATIONS:  Current Outpatient Medications  Medication Sig Dispense Refill   acetaminophen (TYLENOL) 325 MG tablet Take 650 mg by mouth every 6 (six) hours as needed.     ALPRAZolam (XANAX) 0.25 MG tablet Take 0.25 mg by mouth at bedtime as needed for anxiety.     citalopram (CELEXA) 10 MG tablet Take 1 tablet (10 mg total) by mouth daily. 30 tablet  0   dexamethasone (DECADRON) 2 MG tablet Take 1 tablet (2 mg total) by mouth 2 (two) times daily with a meal. First dose to start on 11/10 in the am. 40 tablet 0   esomeprazole (NEXIUM) 40 MG capsule Take 40 mg by mouth daily as needed.      fluticasone (FLONASE) 50 MCG/ACT nasal spray Place 2 sprays into both nostrils daily as needed.      methimazole (TAPAZOLE) 5 MG tablet Take 5 mg by mouth daily.     nystatin  (MYCOSTATIN) 100000 UNIT/ML suspension Take 5 mLs (500,000 Units total) by mouth 4 (four) times daily. Swish and spit. 300 mL 0   ondansetron (ZOFRAN-ODT) 4 MG disintegrating tablet Take 1 tablet (4 mg total) by mouth every 8 (eight) hours as needed for nausea or vomiting. 30 tablet 2   Oxycodone HCl 10 MG TABS Take 1 tablet (10 mg total) by mouth every 4 (four) hours as needed. 60 tablet 0   warfarin (COUMADIN) 5 MG tablet Take 5 mg by mouth daily. Sat and sun 3 mg     No current facility-administered medications for this visit.     VITAL SIGNS: LMP 12/15/2010 (Approximate)  There were no vitals filed for this visit.  Estimated body mass index is 25.34 kg/m as calculated from the following:   Height as of 10/03/19: '5\' 1"'  (1.549 m).   Weight as of 10/05/19: 134 lb 1.6 oz (60.8 kg).  LABS: CBC:    Component Value Date/Time   WBC 12.2 (H) 10/03/2019 1656   HGB 10.2 (L) 10/03/2019 1656   HGB 13.5 11/07/2013 2128   HCT 32.6 (L) 10/03/2019 1656   HCT 39.2 11/07/2013 2128   PLT 405 (H) 10/03/2019 1656   PLT 282 11/07/2013 2128   MCV 95.0 10/03/2019 1656   MCV 96 11/07/2013 2128   NEUTROABS 9.7 (H) 10/03/2019 1656   NEUTROABS 6.8 (H) 05/24/2013 1340   LYMPHSABS 1.3 10/03/2019 1656   LYMPHSABS 1.6 05/24/2013 1340   MONOABS 1.1 (H) 10/03/2019 1656   MONOABS 0.6 05/24/2013 1340   EOSABS 0.0 10/03/2019 1656   EOSABS 0.0 05/24/2013 1340   BASOSABS 0.0 10/03/2019 1656   BASOSABS 0.0 05/24/2013 1340   Comprehensive Metabolic Panel:    Component Value Date/Time   NA 134 (L) 10/03/2019 1656   NA 140 11/07/2013 2128   K 4.1 10/03/2019 1656   K 4.2 11/07/2013 2128   CL 98 10/03/2019 1656   CL 107 11/07/2013 2128   CO2 26 10/03/2019 1656   CO2 29 11/07/2013 2128   BUN 12 10/03/2019 1656   BUN 21 (H) 11/07/2013 2128   CREATININE 0.39 (L) 10/03/2019 1656   CREATININE 0.50 (L) 11/07/2013 2128   GLUCOSE 106 (H) 10/03/2019 1656   GLUCOSE 107 (H) 11/07/2013 2128   CALCIUM 8.9  10/03/2019 1656   CALCIUM 9.3 11/07/2013 2128   AST 17 10/03/2019 1656   AST 48 (H) 11/07/2013 2128   ALT 15 10/03/2019 1656   ALT 60 11/07/2013 2128   ALKPHOS 79 10/03/2019 1656   ALKPHOS 92 11/07/2013 2128   BILITOT 0.3 10/03/2019 1656   BILITOT 0.2 11/07/2013 2128   PROT 8.0 10/03/2019 1656   PROT 7.5 11/07/2013 2128   ALBUMIN 3.4 (L) 10/03/2019 1656   ALBUMIN 3.6 11/07/2013 2128    RADIOGRAPHIC STUDIES: Dg Chest 2 View  Result Date: 10/03/2019 CLINICAL DATA:  Fever of unknown origin, history of lung mass EXAM: CHEST - 2 VIEW COMPARISON:  CT 09/13/2019  FINDINGS: Right lower lobe lung mass. Normal heart size. No pneumothorax. Mild superior endplate deformity at K93 again noted. IMPRESSION: Right lower lobe lung mass.  Negative for acute pulmonary opacity. Electronically Signed   By: Donavan Foil M.D.   On: 10/03/2019 18:12   Mr Jeri Cos OI Contrast  Result Date: 09/26/2019 CLINICAL DATA:  Staging floor are new diagnosis lung cancer. EXAM: MRI HEAD WITHOUT AND WITH CONTRAST TECHNIQUE: Multiplanar, multiecho pulse sequences of the brain and surrounding structures were obtained without and with intravenous contrast. CONTRAST:  28m GADAVIST GADOBUTROL 1 MMOL/ML IV SOLN COMPARISON:  05/25/2017 FINDINGS: Brain: 6 tiny supratentorial brain metastases are identified. 3 mm metastasis lateral left temporal lobe axial image 50. 3 mm right temporal metastasis axial image 61. 2 mm metastasis left temporal white matter axial image 63. 4 mm metastasis left parietooccipital junction axial image 76. 2 mm metastasis left posterior basal ganglia/radiating white matter tracts axial image 87. 3 mm metastasis right parietal cortex axial image 92. None are associated with mass effect, edema or hemorrhage. The brain otherwise appears normal except for a few punctate white matter foci consistent with minimal small vessel change. No hydrocephalus. No extra-axial collection. Vascular: Major vessels at the base of  the brain show flow. Skull and upper cervical spine: Negative Sinuses/Orbits: Clear/normal Other: None IMPRESSION: Six tiny brain metastases as outlined above, all affecting the supratentorial brain. No edema, mass effect or hemorrhage. Electronically Signed   By: MNelson ChimesM.D.   On: 09/26/2019 14:42   Ct Abdomen Pelvis W Contrast  Result Date: 10/03/2019 CLINICAL DATA:  Upper abdominal pain and fevers, history of recent right adrenal biopsy EXAM: CT ABDOMEN AND PELVIS WITH CONTRAST TECHNIQUE: Multidetector CT imaging of the abdomen and pelvis was performed using the standard protocol following bolus administration of intravenous contrast. CONTRAST:  1015mOMNIPAQUE IOHEXOL 300 MG/ML  SOLN COMPARISON:  09/13/2019 FINDINGS: Lower chest: Stable right lower lobe mass lesion is noted suspicious for primary pulmonary neoplasm. Hepatobiliary: No focal liver abnormality is seen. No gallstones, gallbladder wall thickening, or biliary dilatation. Pancreas: Unremarkable. No pancreatic ductal dilatation or surrounding inflammatory changes. Spleen: Normal in size without focal abnormality. Adrenals/Urinary Tract: Adrenal glands demonstrate lesions bilaterally right slightly greater than left. The overall appearance of the lesions is stable from the prior CT examination consistent with metastatic disease as previously proven with biopsy. The kidneys demonstrate a normal enhancement pattern. No renal calculi or obstructive changes are seen. Stable right renal cyst is noted. No obstructive changes are noted. The bladder is well distended. Stomach/Bowel: Fecal material is noted throughout the colon without obstructive change. The appendix is well visualized and within normal limits. The stomach and small bowel demonstrate no focal abnormality. Vascular/Lymphatic: Aortic atherosclerosis. No enlarged abdominal or pelvic lymph nodes. Reproductive: Uterus is within normal limits. Small 15 mm cyst is noted within the right  ovary. The left adnexal region appears within normal limits. Other: No abdominal wall hernia or abnormality. No abdominopelvic ascites. Musculoskeletal: Degenerative changes of the lumbar spine are noted. Stable vertebral body height loss at T12 is noted. No acute bony abnormality is seen. IMPRESSION: Right lower lobe lung mass with biopsy-proven adrenal metastatic disease. No acute abnormality is noted to account for the current clinical history. Chronic changes as described above. Electronically Signed   By: MaInez Catalina.D.   On: 10/03/2019 23:05   Nm Pet Image Initial (pi) Skull Base To Thigh  Result Date: 09/20/2019 CLINICAL DATA:  Initial treatment strategy for lung  mass. EXAM: NUCLEAR MEDICINE PET SKULL BASE TO THIGH TECHNIQUE: 6.7 mCi F-18 FDG was injected intravenously. Full-ring PET imaging was performed from the skull base to thigh after the radiotracer. CT data was obtained and used for attenuation correction and anatomic localization. Fasting blood glucose: 80 mg/dl COMPARISON:  Abdomen/pelvis CT 09/13/2019. FINDINGS: Mediastinal blood pool activity: SUV max 3.0 Liver activity: SUV max NA NECK: 5 mm short axis right supraclavicular node (25/9) is hypermetabolic with SUV max = 56.3. Hypermetabolic nodules are identified in the thyroid parenchyma bilaterally. Incidental CT findings: none CHEST: 3.6 cm right lower lobe pulmonary mass is markedly hypermetabolic with SUV max = 87.5. Hypermetabolic mediastinal lymphadenopathy evident. *8 mm short axis high paraesophageal node on 52/3 demonstrates SUV max = 4.2. *8 mm retrotracheal node on 62/3 demonstrates SUV max = 7.3. *1.3 cm short axis subcarinal node is hypermetabolic with SUV max = 64.3. *1.1 cm right hilar node is hypermetabolic with SUV max = 32.9. Incidental CT findings: Centrilobular emphsyema noted. Atherosclerotic calcification is noted in the wall of the thoracic aorta. No evidence for pleural effusion. There is a tiny 2 mm right lower lobe  nodule (75/3) too small to characterize and below threshold for resolution on PET imaging. ABDOMEN/PELVIS: 4 cm right adrenal mass shows marked hypermetabolism consistent with metastatic involvement ( SUV max = 16.6. Smaller 2.1 cm left adrenal metastasis is also hypermetabolic. Focus of hypermetabolism in the central pelvis maps to an apparent sigmoid diverticulum (195/3) although this could be a pericolonic lymph node. Activity in the right colon is presumably physiologic. Incidental CT findings: 4.5 cm cyst identified interpolar right kidney anteriorly. Left colonic diverticulosis evident without CT findings of diverticulitis. SKELETON: No focal hypermetabolic activity to suggest skeletal metastasis. Incidental CT findings: Prominent muscular atrophy noted throughout consistent with reported history of muscular dystrophy. IMPRESSION: 1. 3.6 cm right lower lobe pulmonary mass is markedly hypermetabolic consistent with primary bronchogenic neoplasm. There is associated hypermetabolic metastatic disease in the right supraclavicular space, mediastinum, right hilum, and both adrenal glands. Hypermetabolic nodule in the pelvis adjacent to the sigmoid colon is probably a diverticulum although a small hypermetabolic pericolonic lymph node could have this appearance. Attention on follow-up recommended. 2. Hypermetabolic bilateral thyroid nodules not well evaluated on noncontrast CT. Thyroid ultrasound may prove helpful to further evaluate or attention on follow-up could be used to assess stability. 3.  Aortic Atherosclerois (ICD10-170.0) Electronically Signed   By: Misty Stanley M.D.   On: 09/20/2019 14:03   Ct Biopsy  Result Date: 10/01/2019 CLINICAL DATA:  Right lower lobe lung mass, bilateral adrenal masses and metastatic mediastinal lymphadenopathy. The patient presents for biopsy of the right adrenal mass. EXAM: CT GUIDED CORE BIOPSY OF RIGHT ADRENAL MASS ANESTHESIA/SEDATION: 4.0 mg IV Versed; 100 mcg IV  Fentanyl Total Moderate Sedation Time:  23 minutes. The patient's level of consciousness and physiologic status were continuously monitored during the procedure by Radiology nursing. PROCEDURE: The procedure risks, benefits, and alternatives were explained to the patient. Questions regarding the procedure were encouraged and answered. The patient understands and consents to the procedure. A time-out was performed prior to initiating the procedure. CT was performed in a prone position followed by an oblique position with the left side raised slightly. The right flank region was prepped with chlorhexidine in a sterile fashion, and a sterile drape was applied covering the operative field. A sterile gown and sterile gloves were used for the procedure. Local anesthesia was provided with 1% Lidocaine. Under CT guidance, a 17  gauge trocar needle was advanced to the level of a right adrenal mass. After confirming needle tip position, 2 separate coaxial 18 gauge core biopsy samples were obtained. Samples were submitted in formalin. Additional CT imaging was performed prior to and following outer needle removal. COMPLICATIONS: None FINDINGS: The right adrenal gland is enlarged and engulfed by a mass measuring approximately 3.4 x 4.3 cm. Solid tissue was obtained with biopsy. At the right lung base, note is again made of a lobulated right lower lobe lung mass measuring approximately 4.4 cm. IMPRESSION: CT-guided core biopsy performed of right adrenal mass. Electronically Signed   By: Aletta Edouard M.D.   On: 10/01/2019 12:55    PERFORMANCE STATUS (ECOG) : 2 - Symptomatic, <50% confined to bed  Review of Systems Unless otherwise noted, a complete review of systems is negative.  Physical Exam General: NAD, frail appearing, thin Pulmonary: unlabored Skin: no rashes Neurological: Weakness but otherwise nonfocal  IMPRESSION: I met today with patient and her twin sister in the clinic.  Patient was referred to  radiation oncology to start whole brain radiation this week.  We had a lengthy conversation regarding her goals for treatment.  She says she has been hesitant to consider either radiation or systemic chemotherapy/immunotherapy due to fear that treatment will negatively impact her quality of life.  Patient says that maintaining her quality of life is her primary goal.  We discussed the possible implication of foregoing treatment as her brain mets could progress causing neurological dysfunction.  Patient recognizes that she can stop treatment at any time should she opt to proceed.  Symptomatically, patient has pain intermittently that has improved with use of oxycodone.  She has intermittent nausea, which is improved with use of Zofran.  Will refill both of those medications.  She does have constipation, which is most likely opioid induced.  She is taking daily MiraLAX.  Will add once or twice daily Senokot.  Patient can also try twice daily MiraLAX if needed.  She has had significant anxiety and depression.  She was started 1 week ago on citalopram 10 mg.  Expectedly, there has not been significant time yet for patient to tell improvement, however, she seems to have tolerated the medication so far.  Will increase dose to 20 mg daily.  Weakness -patient has Medicaid.  She would likely benefit from [facility] services as patient performance status will likely decline over time.  I discussed home resources with patient some today but will likely need to discuss further over time.  Will make referral for patient to see OT with consideration of the care program.  We will also order a Rollator walker and shower chair.  Patient sister is staying with her in the home as her primary caregiver.  We discussed advance care planning and I reviewed with her a MOST Form, which she took home to think about.  She is interested in appointing her sister to be your healthcare power of attorney.  She will likely take documents by bank  to be witnessed and notarized.  Encouraged her to bring a copy to the clinic when documents are completed.  Emotional support provided.  Patient is particularly concerned about how her grandchildren will take the news of her illness.  I recommended involvement of [facility] to help with conversations and provide counseling services as needed.  PLAN: -Continue current scope of treatment -Refill oxycodone 10 mg every 4 hours (# 60) -Increase citalopram to 20 mg daily -Refill ondansetron ODT 4 mg every  8 hours as needed for nausea -Compazine 5 to 10 mg every 6 hours as needed for nausea -MiraLAX daily -Add senna once or twice daily as needed for constipation -Oral supplements twice daily to three times daily -Referrals to OT and RD -DME: Shower chair and Rollator walker -ACP documents and MOST Form reviewed -Recommend [facility] involvement -Recommend counseling services -Follow-up telephone visit in 1 to 2 weeks.  We will plan to see patient back in the clinic when she sees Dr. Janese Banks.   Patient expressed understanding and was in agreement with this plan. She also understands that She can call the clinic at any time with any questions, concerns, or complaints.     Time Total: 60 minutes  Visit consisted of counseling and education dealing with the complex and emotionally intense issues of symptom management and palliative care in the setting of serious and potentially life-threatening illness.Greater than 50%  of this time was spent counseling and coordinating care related to the above assessment and plan.  Signed by: Altha Harm, PhD, NP-C

## 2019-10-16 ENCOUNTER — Ambulatory Visit: Payer: Medicare Other

## 2019-10-17 ENCOUNTER — Ambulatory Visit: Payer: Medicare Other

## 2019-10-17 ENCOUNTER — Inpatient Hospital Stay: Payer: Medicare Other | Admitting: Occupational Therapy

## 2019-10-18 ENCOUNTER — Other Ambulatory Visit: Payer: Self-pay

## 2019-10-18 ENCOUNTER — Encounter: Payer: Self-pay | Admitting: Oncology

## 2019-10-18 ENCOUNTER — Ambulatory Visit
Admission: RE | Admit: 2019-10-18 | Discharge: 2019-10-18 | Disposition: A | Discharge status: Home / Self Care | Payer: Medicare Other | Source: Ambulatory Visit | Attending: Radiation Oncology | Admitting: Radiation Oncology

## 2019-10-18 DIAGNOSIS — Z510 Encounter for antineoplastic radiation therapy: Secondary | ICD-10-CM | POA: Diagnosis not present

## 2019-10-18 NOTE — Progress Notes (Signed)
PSN spoke with patient today about resources that are available to her, financially, if needed.  Patient stated that the monthly gas voucher and food card from [employer] would be helpful.  PSN will assist patient with these resources as needed.

## 2019-10-19 ENCOUNTER — Other Ambulatory Visit: Payer: Self-pay

## 2019-10-19 ENCOUNTER — Ambulatory Visit
Admission: RE | Admit: 2019-10-19 | Discharge: 2019-10-19 | Disposition: A | Discharge status: Home / Self Care | Payer: Medicare Other | Source: Ambulatory Visit | Attending: Radiation Oncology | Admitting: Radiation Oncology

## 2019-10-19 ENCOUNTER — Telehealth: Payer: Self-pay | Admitting: *Deleted

## 2019-10-19 ENCOUNTER — Inpatient Hospital Stay: Payer: Medicare Other

## 2019-10-19 DIAGNOSIS — E538 Deficiency of other specified B group vitamins: Secondary | ICD-10-CM

## 2019-10-19 DIAGNOSIS — K1230 Oral mucositis (ulcerative), unspecified: Secondary | ICD-10-CM

## 2019-10-19 DIAGNOSIS — Z510 Encounter for antineoplastic radiation therapy: Secondary | ICD-10-CM | POA: Diagnosis not present

## 2019-10-19 DIAGNOSIS — C3431 Malignant neoplasm of lower lobe, right bronchus or lung: Secondary | ICD-10-CM | POA: Diagnosis not present

## 2019-10-19 LAB — IRON AND TIBC
Iron: 37 ug/dL (ref 28–170)
Saturation Ratios: 15 % (ref 10.4–31.8)
TIBC: 245 ug/dL — ABNORMAL LOW (ref 250–450)
UIBC: 208 ug/dL

## 2019-10-19 LAB — FOLATE: Folate: 12.4 ng/mL (ref >5.9)

## 2019-10-19 LAB — FERRITIN: Ferritin: 204 ng/mL (ref 11–307)

## 2019-10-19 LAB — VITAMIN B12: Vitamin B-12: 647 pg/mL (ref 180–914)

## 2019-10-19 MED ORDER — MAGIC MOUTHWASH: 5.0000 mL | Freq: Three times a day (TID) | ORAL | 1 refills | Status: DC | PRN

## 2019-10-19 MED ORDER — VITAMIN B-12 1000 MCG PO TABS: 1000.0000 ug | ORAL_TABLET | Freq: Every day | ORAL | 1 refills | Status: DC

## 2019-10-19 NOTE — Telephone Encounter (Signed)
Pt's sister called in to report pt continues to have redness and tenderness to her tongue that has not improved with nystatin swish and spit. Per Dr. Janese Banks, may be related to vit b12 deficiency and pt will need labs checked today and start taking vit b12 supplement 1051mcg daily. Also, pt may try magic mouthwash. Pt informed to pick up vit b12 OTC and start taking 1023mcg daily. Pt requested to have prescription sent in to see if insurance will cover. Magic mouthwash prescription has been called into the pharmacy.   While in clinic, pt stated was having acid reflux that started a few days ago. States that she has not taken her nexium. Pt instructed to restart nexium and call back if no improvement.   Pt verbalized understanding. Nothing further needed at this time.

## 2019-10-21 ENCOUNTER — Other Ambulatory Visit: Payer: Self-pay

## 2019-10-21 DIAGNOSIS — C7971 Secondary malignant neoplasm of right adrenal gland: Secondary | ICD-10-CM

## 2019-10-22 ENCOUNTER — Inpatient Hospital Stay: Payer: Medicare Other

## 2019-10-22 ENCOUNTER — Other Ambulatory Visit: Payer: Self-pay

## 2019-10-22 ENCOUNTER — Telehealth: Payer: Self-pay | Admitting: *Deleted

## 2019-10-22 ENCOUNTER — Inpatient Hospital Stay (HOSPITAL_BASED_OUTPATIENT_CLINIC_OR_DEPARTMENT_OTHER): Payer: Medicare Other | Admitting: Oncology

## 2019-10-22 ENCOUNTER — Ambulatory Visit
Admission: RE | Admit: 2019-10-22 | Discharge: 2019-10-22 | Disposition: A | Discharge status: Home / Self Care | Payer: Medicare Other | Source: Ambulatory Visit | Attending: Radiation Oncology | Admitting: Radiation Oncology

## 2019-10-22 DIAGNOSIS — J029 Acute pharyngitis, unspecified: Secondary | ICD-10-CM

## 2019-10-22 DIAGNOSIS — C3431 Malignant neoplasm of lower lobe, right bronchus or lung: Secondary | ICD-10-CM | POA: Diagnosis not present

## 2019-10-22 DIAGNOSIS — D72829 Elevated white blood cell count, unspecified: Secondary | ICD-10-CM | POA: Diagnosis not present

## 2019-10-22 DIAGNOSIS — Z510 Encounter for antineoplastic radiation therapy: Secondary | ICD-10-CM | POA: Diagnosis not present

## 2019-10-22 DIAGNOSIS — R042 Hemoptysis: Secondary | ICD-10-CM

## 2019-10-22 DIAGNOSIS — Z5189 Encounter for other specified aftercare: Secondary | ICD-10-CM

## 2019-10-22 LAB — CBC WITH DIFFERENTIAL/PLATELET
Abs Immature Granulocytes: 0.11 10*3/uL — ABNORMAL HIGH (ref 0.00–0.07)
Basophils Absolute: 0 10*3/uL (ref 0.0–0.1)
Basophils Relative: 0 %
Eosinophils Absolute: 0 10*3/uL (ref 0.0–0.5)
Eosinophils Relative: 0 %
HCT: 36.5 % (ref 36.0–46.0)
Hemoglobin: 11.4 g/dL — ABNORMAL LOW (ref 12.0–15.0)
Immature Granulocytes: 1 %
Lymphocytes Relative: 5 %
Lymphs Abs: 0.8 10*3/uL (ref 0.7–4.0)
MCH: 29 pg (ref 26.0–34.0)
MCHC: 31.2 g/dL (ref 30.0–36.0)
MCV: 92.9 fL (ref 80.0–100.0)
Monocytes Absolute: 1.4 10*3/uL — ABNORMAL HIGH (ref 0.1–1.0)
Monocytes Relative: 9 %
Neutro Abs: 13.1 10*3/uL — ABNORMAL HIGH (ref 1.7–7.7)
Neutrophils Relative %: 85 %
Platelets: 659 10*3/uL — ABNORMAL HIGH (ref 150–400)
RBC: 3.93 MIL/uL (ref 3.87–5.11)
RDW: 12.9 % (ref 11.5–15.5)
WBC: 15.4 10*3/uL — ABNORMAL HIGH (ref 4.0–10.5)
nRBC: 0 % (ref 0.0–0.2)

## 2019-10-22 LAB — SAMPLE TO BLOOD BANK

## 2019-10-22 LAB — COMPREHENSIVE METABOLIC PANEL
ALT: 30 U/L (ref 0–44)
AST: 22 U/L (ref 15–41)
Albumin: 3.5 g/dL (ref 3.5–5.0)
Alkaline Phosphatase: 69 U/L (ref 38–126)
Anion gap: 9 (ref 5–15)
BUN: 21 mg/dL — ABNORMAL HIGH (ref 6–20)
CO2: 25 mmol/L (ref 22–32)
Calcium: 9.6 mg/dL (ref 8.9–10.3)
Chloride: 101 mmol/L (ref 98–111)
Creatinine, Ser: 0.34 mg/dL — ABNORMAL LOW (ref 0.44–1.00)
GFR calc Af Amer: >60 mL/min (ref >60)
GFR calc non Af Amer: >60 mL/min (ref >60)
Glucose, Bld: 108 mg/dL — ABNORMAL HIGH (ref 70–99)
Potassium: 4.1 mmol/L (ref 3.5–5.1)
Sodium: 135 mmol/L (ref 135–145)
Total Bilirubin: 0.4 mg/dL (ref 0.3–1.2)
Total Protein: 7.9 g/dL (ref 6.5–8.1)

## 2019-10-22 MED ORDER — PANTOPRAZOLE SODIUM 20 MG PO TBEC: 20.0000 mg | DELAYED_RELEASE_TABLET | Freq: Two times a day (BID) | ORAL | 0 refills | Status: DC

## 2019-10-22 NOTE — Progress Notes (Signed)
Symptom Management Consult note [facility]  [phone] [phone]  Patient Care Team: Tracie Harrier, MD as PCP - General (Internal Medicine) Telford Nab, RN as Registered Nurse   Name of the patient: Deborah Nguyen  [id number]  1960/12/22   Date of visit: 10/22/2019   Diagnosis- Lung cancer   Chief complaint/ Reason for visit- sore throat/Headache/gerd/constipation  Heme/Onc history:  Oncology History  Malignant neoplasm of lower lobe of right lung ([place])  09/20/2019 Initial Diagnosis   Malignant neoplasm of lower lobe of right lung ([place])   10/05/2019 Cancer Staging   Staging form: Lung, AJCC 8th Edition - Clinical stage from 10/05/2019: Stage IV (cT2, cN2, cM1) - Signed by Sindy Guadeloupe, MD on 10/08/2019    Interval history-Mrs. Guedea is a 58 year old woman who presents to symptom management today for complaints of a sore throat, headache and constipation  for several days.  She has past medical history positive for muscular dystrophy, anxiety, hypothyroidism, recurrent hyperthermia and a history of lower extremity DVT.  She was diagnosed with stage IV lung cancer after having right flank pain over several months.  Had PET CT on 09/20/2019 with findings of hypermetabolic mass in the right lower lobe, subclavicular/mediastinal/right hilar lymph nodes and bilateral adrenal glands.  She was found to have several brain mass on recent MRI.  She is currently receiving whole brain radiation.  Patient states she developed worsening headaches, acid reflux-like symptoms, worsening constipation and abdominal fullness/head pain over the past several days.  She has had a total of 2 radiation treatments to her brain metastasis.  States has a history of low-grade fevers, headaches and GERD for 6 or 7 years with unknown significance until now. On nexium but does not take daily.  She feels that her headaches are stemming from her cancer.   States her head and neck feel "hot" and appear red. Has intermittent nausea which is improved with the use of Zofran.  She has oxycodone 10 mg tablets to take every 4 hours but states they are too strong.  She has been cutting them in half but forgetting to consitently take them.  She uses Tylenol 500 mg as needed for headaches.  Also complains of constipation.  Last BM was Saturday.  Was instructed to take senna and MiraLAX but has not been consistent with this.  She denies diarrhea.  She denies any shortness of breath or chest pain.  Has had an okay appetite. Weight stable.  ECOG FS:2 - Symptomatic, <50% confined to bed  Review of systems- Review of Systems  Constitutional: Positive for malaise/fatigue and weight loss. Negative for chills and fever.  HENT: Positive for sore throat. Negative for congestion, ear pain and tinnitus.   Eyes: Negative.  Negative for blurred vision and double vision.  Respiratory: Negative.  Negative for cough, sputum production and shortness of breath.   Cardiovascular: Negative.  Negative for chest pain, palpitations and leg swelling.  Gastrointestinal: Positive for abdominal pain, constipation, heartburn and nausea. Negative for diarrhea and vomiting.  Genitourinary: Negative for dysuria, frequency and urgency.  Musculoskeletal: Positive for back pain. Negative for falls.  Skin: Negative.  Negative for rash.  Neurological: Positive for weakness and headaches.  Endo/Heme/Allergies: Negative.  Does not bruise/bleed easily.  Psychiatric/Behavioral: Positive for memory loss. Negative for depression. The patient is nervous/anxious. The patient does not have insomnia.      Current treatment-whole brain radiation  Allergies  Allergen Reactions   Paxil [Paroxetine Hcl] Other (  See Comments)    Hallucinations    Ciprofloxacin Other (See Comments)    Unknown   Levaquin [Levofloxacin] Other (See Comments)    Unknown    Serzone [Nefazodone] Other (See Comments)      Unknown    Biaxin [Clarithromycin] Nausea Only   Erythromycin Nausea Only   Penicillins Rash     Past Medical History:  Diagnosis Date   Anxiety    Arthritis    Chronic kidney disease    Family history of breast cancer    aunt   GERD (gastroesophageal reflux disease)    History of DVT of lower extremity    Left leg   Lung cancer ([place])    Lung mass    Malignant hyperthermia    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[place])    mild form - per patient central cord disease   Muscular dystrophy [facility])      Past Surgical History:  Procedure Laterality Date   CESAREAN SECTION  11/1985   COLONOSCOPY N/A 10/27/2015   Procedure: COLONOSCOPY;  Surgeon: Manya Silvas, MD;  Location: [facility];  Service: Endoscopy;  Laterality: N/A;   NO Propofol - per office   LITHOTRIPSY     TUBAL LIGATION  03/1986    Social History   Socioeconomic History   Marital status: Divorced    Spouse name: Not on file   Number of children: Not on file   Years of education: Not on file   Highest education level: Not on file  Occupational History   Not on file  Social Needs   Financial resource strain: Not on file   Food insecurity    Worry: Not on file    Inability: Not on file   Transportation needs    Medical: Not on file    Non-medical: Not on file  Tobacco Use   Smoking status: Former Smoker    Packs/day: 1.00    Types: Cigarettes    Quit date: 05/2016    Years since quitting: 3.4   Smokeless tobacco: Never Used  Substance and Sexual Activity   Alcohol use: No   Drug use: No   Sexual activity: Not on file  Lifestyle   Physical activity    Days per week: Not on file    Minutes per session: Not on file   Stress: Not on file  Relationships   Social connections    Talks on phone: Not on file    Gets together: Not on file    Attends religious service: Not on file    Active member of club or organization: Not on file    Attends meetings of clubs or  organizations: Not on file    Relationship status: Not on file   Intimate partner violence    Fear of current or ex partner: Not on file    Emotionally abused: Not on file    Physically abused: Not on file    Forced sexual activity: Not on file  Other Topics Concern   Not on file  Social History Narrative   Not on file    Family History  Problem Relation Age of Onset   Cancer Father        bladder, lung   Breast cancer Maternal Aunt 45     Current Outpatient Medications:    acetaminophen (TYLENOL) 325 MG tablet, Take 650 mg by mouth every 6 (six) hours as needed., Disp: , Rfl:    ALPRAZolam (XANAX) 0.25 MG tablet, Take 0.25 mg by  mouth at bedtime as needed for anxiety., Disp: , Rfl:    citalopram (CELEXA) 20 MG tablet, Take 1 tablet (20 mg total) by mouth daily., Disp: 30 tablet, Rfl: 2   dexamethasone (DECADRON) 2 MG tablet, Take 1 tablet (2 mg total) by mouth 2 (two) times daily with a meal. First dose to start on 11/10 in the am., Disp: 40 tablet, Rfl: 0   esomeprazole (NEXIUM) 40 MG capsule, Take 40 mg by mouth daily as needed. , Disp: , Rfl:    fluticasone (FLONASE) 50 MCG/ACT nasal spray, Place 2 sprays into both nostrils daily as needed. , Disp: , Rfl:    magic mouthwash SOLN, Take 5 mLs by mouth 3 (three) times daily as needed for mouth pain., Disp: 300 mL, Rfl: 1   methimazole (TAPAZOLE) 5 MG tablet, Take 5 mg by mouth daily., Disp: , Rfl:    ondansetron (ZOFRAN-ODT) 4 MG disintegrating tablet, Take 1 tablet (4 mg total) by mouth every 8 (eight) hours as needed for nausea or vomiting., Disp: 30 tablet, Rfl: 2   Oxycodone HCl 10 MG TABS, Take 1 tablet (10 mg total) by mouth every 4 (four) hours as needed., Disp: 60 tablet, Rfl: 0   prochlorperazine (COMPAZINE) 10 MG tablet, Take 0.5-1 tablets (5-10 mg total) by mouth every 6 (six) hours as needed for nausea or vomiting., Disp: 30 tablet, Rfl: 2   vitamin B-12 (CYANOCOBALAMIN) 1000 MCG tablet, Take 1 tablet  (1,000 mcg total) by mouth daily., Disp: 30 tablet, Rfl: 1   warfarin (COUMADIN) 5 MG tablet, Take 5 mg by mouth daily. Sat and sun 3 mg, Disp: , Rfl:    nystatin (MYCOSTATIN) 100000 UNIT/ML suspension, Take 5 mLs (500,000 Units total) by mouth 4 (four) times daily. Swish and spit. (Patient not taking: Reported on 10/22/2019), Disp: 300 mL, Rfl: 0   pantoprazole (PROTONIX) 20 MG tablet, Take 1 tablet (20 mg total) by mouth 2 (two) times daily., Disp: 120 tablet, Rfl: 0  Physical exam: There were no vitals filed for this visit. Physical Exam Constitutional:      Appearance: Normal appearance.     Comments: wheelchair  HENT:     Head: Normocephalic and atraumatic.  Eyes:     Pupils: Pupils are equal, round, and reactive to light.  Neck:     Musculoskeletal: Normal range of motion.  Cardiovascular:     Rate and Rhythm: Normal rate and regular rhythm.     Heart sounds: Normal heart sounds. No murmur.  Pulmonary:     Effort: Pulmonary effort is normal.     Breath sounds: Normal breath sounds. No wheezing.  Abdominal:     General: Bowel sounds are normal. There is no distension.     Palpations: Abdomen is soft.     Tenderness: There is no abdominal tenderness.  Musculoskeletal: Normal range of motion.  Skin:    General: Skin is warm and dry.     Findings: No rash.  Neurological:     Mental Status: She is alert and oriented to person, place, and time.  Psychiatric:        Judgment: Judgment normal.      CMP Latest Ref Rng & Units 10/22/2019  Glucose 70 - 99 mg/dL 108(H)  BUN 6 - 20 mg/dL 21(H)  Creatinine 0.44 - 1.00 mg/dL 0.34(L)  Sodium 135 - 145 mmol/L 135  Potassium 3.5 - 5.1 mmol/L 4.1  Chloride 98 - 111 mmol/L 101  CO2 22 - 32 mmol/L 25  Calcium 8.9 - 10.3 mg/dL 9.6  Total Protein 6.5 - 8.1 g/dL 7.9  Total Bilirubin 0.3 - 1.2 mg/dL 0.4  Alkaline Phos 38 - 126 U/L 69  AST 15 - 41 U/L 22  ALT 0 - 44 U/L 30   CBC Latest Ref Rng & Units 10/22/2019  WBC 4.0 - 10.5  K/uL 15.4(H)  Hemoglobin 12.0 - 15.0 g/dL 11.4(L)  Hematocrit 36.0 - 46.0 % 36.5  Platelets 150 - 400 K/uL 659(H)    No images are attached to the encounter.  Dg Chest 2 View  Result Date: 10/03/2019 CLINICAL DATA:  Fever of unknown origin, history of lung mass EXAM: CHEST - 2 VIEW COMPARISON:  CT 09/13/2019 FINDINGS: Right lower lobe lung mass. Normal heart size. No pneumothorax. Mild superior endplate deformity at Z61 again noted. IMPRESSION: Right lower lobe lung mass.  Negative for acute pulmonary opacity. Electronically Signed   By: Donavan Foil M.D.   On: 10/03/2019 18:12   Mr Jeri Cos WR Contrast  Result Date: 09/26/2019 CLINICAL DATA:  Staging floor are new diagnosis lung cancer. EXAM: MRI HEAD WITHOUT AND WITH CONTRAST TECHNIQUE: Multiplanar, multiecho pulse sequences of the brain and surrounding structures were obtained without and with intravenous contrast. CONTRAST:  36m GADAVIST GADOBUTROL 1 MMOL/ML IV SOLN COMPARISON:  05/25/2017 FINDINGS: Brain: 6 tiny supratentorial brain metastases are identified. 3 mm metastasis lateral left temporal lobe axial image 50. 3 mm right temporal metastasis axial image 61. 2 mm metastasis left temporal white matter axial image 63. 4 mm metastasis left parietooccipital junction axial image 76. 2 mm metastasis left posterior basal ganglia/radiating white matter tracts axial image 87. 3 mm metastasis right parietal cortex axial image 92. None are associated with mass effect, edema or hemorrhage. The brain otherwise appears normal except for a few punctate white matter foci consistent with minimal small vessel change. No hydrocephalus. No extra-axial collection. Vascular: Major vessels at the base of the brain show flow. Skull and upper cervical spine: Negative Sinuses/Orbits: Clear/normal Other: None IMPRESSION: Six tiny brain metastases as outlined above, all affecting the supratentorial brain. No edema, mass effect or hemorrhage. Electronically Signed    By: MNelson ChimesM.D.   On: 09/26/2019 14:42   Ct Abdomen Pelvis W Contrast  Result Date: 10/03/2019 CLINICAL DATA:  Upper abdominal pain and fevers, history of recent right adrenal biopsy EXAM: CT ABDOMEN AND PELVIS WITH CONTRAST TECHNIQUE: Multidetector CT imaging of the abdomen and pelvis was performed using the standard protocol following bolus administration of intravenous contrast. CONTRAST:  1078mOMNIPAQUE IOHEXOL 300 MG/ML  SOLN COMPARISON:  09/13/2019 FINDINGS: Lower chest: Stable right lower lobe mass lesion is noted suspicious for primary pulmonary neoplasm. Hepatobiliary: No focal liver abnormality is seen. No gallstones, gallbladder wall thickening, or biliary dilatation. Pancreas: Unremarkable. No pancreatic ductal dilatation or surrounding inflammatory changes. Spleen: Normal in size without focal abnormality. Adrenals/Urinary Tract: Adrenal glands demonstrate lesions bilaterally right slightly greater than left. The overall appearance of the lesions is stable from the prior CT examination consistent with metastatic disease as previously proven with biopsy. The kidneys demonstrate a normal enhancement pattern. No renal calculi or obstructive changes are seen. Stable right renal cyst is noted. No obstructive changes are noted. The bladder is well distended. Stomach/Bowel: Fecal material is noted throughout the colon without obstructive change. The appendix is well visualized and within normal limits. The stomach and small bowel demonstrate no focal abnormality. Vascular/Lymphatic: Aortic atherosclerosis. No enlarged abdominal or pelvic lymph nodes. Reproductive: Uterus is  within normal limits. Small 15 mm cyst is noted within the right ovary. The left adnexal region appears within normal limits. Other: No abdominal wall hernia or abnormality. No abdominopelvic ascites. Musculoskeletal: Degenerative changes of the lumbar spine are noted. Stable vertebral body height loss at T12 is noted. No acute  bony abnormality is seen. IMPRESSION: Right lower lobe lung mass with biopsy-proven adrenal metastatic disease. No acute abnormality is noted to account for the current clinical history. Chronic changes as described above. Electronically Signed   By: Inez Catalina M.D.   On: 10/03/2019 23:05   Ct Biopsy  Result Date: 10/01/2019 CLINICAL DATA:  Right lower lobe lung mass, bilateral adrenal masses and metastatic mediastinal lymphadenopathy. The patient presents for biopsy of the right adrenal mass. EXAM: CT GUIDED CORE BIOPSY OF RIGHT ADRENAL MASS ANESTHESIA/SEDATION: 4.0 mg IV Versed; 100 mcg IV Fentanyl Total Moderate Sedation Time:  23 minutes. The patient's level of consciousness and physiologic status were continuously monitored during the procedure by Radiology nursing. PROCEDURE: The procedure risks, benefits, and alternatives were explained to the patient. Questions regarding the procedure were encouraged and answered. The patient understands and consents to the procedure. A time-out was performed prior to initiating the procedure. CT was performed in a prone position followed by an oblique position with the left side raised slightly. The right flank region was prepped with chlorhexidine in a sterile fashion, and a sterile drape was applied covering the operative field. A sterile gown and sterile gloves were used for the procedure. Local anesthesia was provided with 1% Lidocaine. Under CT guidance, a 17 gauge trocar needle was advanced to the level of a right adrenal mass. After confirming needle tip position, 2 separate coaxial 18 gauge core biopsy samples were obtained. Samples were submitted in formalin. Additional CT imaging was performed prior to and following outer needle removal. COMPLICATIONS: None FINDINGS: The right adrenal gland is enlarged and engulfed by a mass measuring approximately 3.4 x 4.3 cm. Solid tissue was obtained with biopsy. At the right lung base, note is again made of a lobulated  right lower lobe lung mass measuring approximately 4.4 cm. IMPRESSION: CT-guided core biopsy performed of right adrenal mass. Electronically Signed   By: Aletta Edouard M.D.   On: 10/01/2019 12:55     Assessment and plan- Patient is a 58 y.o. female who presents to symptom management for complaints of constipation, headache, gerd and "feeling hot".   Metastatic lung cancer: Recently diagnosed after reporting complaints of flank pain over several months.  Imaging revealed metastatic disease in right supraclavicular space, mediastinum right hilum and bilateral adrenal glands.  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  Had MRI of her brain revealing 6 tiny brain metastasis.  She met with radiation oncology who recommends 3000 cGy in 12 fractions.  She was started on low-dose Decadron.  Daily radiation treatment began on 10/15/2019 through current.   Headache: Likely d/t brain mets.  She is currently taking 2 mg Decadron twice daily.  Instructed she start taking oxycodone 5 mg every 4-6 hours to stay on top of the pain.  Constipation: She is inconsistent with her medications for constipation prophylaxis.  Recommend she began taking MiraLAX and Senokot 1-2 times daily based on bowel movements.  GERD: Previously on Nexium 40 mg once a day.  Can switch to Protonix 20 mg twice a day.   Mouth sores: Continue Magic mouthwash and nystatin swish and swallow.  Also recommend she  try salt water gargles  Leukocytosis: White count 15.4 today.  This is likely related to current steroid use for brain metastasis.  No evidence of infection.  She has previously been worked up on several occasions due to low-grade temperature.  To date it is been negative.  Today she denies any shortness of breath, cough, dysuria or diarrhea.  Side effects from steroid use: She is experiencing feeling "hot" and some reddening of the skin.  This is likely related to the use of steroids.   Side effects of Decadron include depression, emotional lability, headache, malaise, diaphoresis, facial erythema and many others.  I educated her today on these side effects.  Plan: Review medications.  Labs. (Leukocytosis 15.4) Vitals signs.  Stable. Start Miralax and Senna 1-2 times daily.  RX Protonix 20 mg BID. Take 5 mg Oxycodone every 4-6 hours around the clock. 10 mg is to potent.  Hydration: sample given for electrolyte replacement.  Discussed all of her medications and when to take each one of them.  Daily medication administration checklist completed for patient.   Disposition: RTC daily for radiation.  RTC on 10/25/19 for labs, md assess and further discuss plan of care.   Visit Diagnosis 1. Leukocytosis, unspecified type     Patient expressed understanding and was in agreement with this plan. She also understands that She can call clinic at any time with any questions, concerns, or complaints.   Greater than 50% was spent in counseling and coordination of care with this patient including but not limited to discussion of the relevant topics above (See A&P) including, but not limited to diagnosis and management of acute and chronic medical conditions.   Thank you for allowing me to participate in the care of this very pleasant patient.    Jacquelin Hawking, NP [facility] Cell - [phone] Pager- [phone] 10/22/2019 12:48 PM

## 2019-10-22 NOTE — Telephone Encounter (Signed)
Per secure chat  Dr Mike Gip: Patient needs to be seen this morning (? 8:30). She is receiving radiation for brain metastasis. Today is day 3 (appt at 9 AM). She doesn't feel well. She has spit up a small amount of blood and notes soreness in her throat. She denies any hemoptysis. She is not light headed or dizzy. Her sister does not feel like she needs to go to the ER. She was told to go to the ER or call an ambulance if things changed in the next hour. Please call Jearld Fenton at [phone].  Me:  Dr Janese Banks, Do I need to call her and schedule her to see you or [facility]? Dr Janese Banks:  [facility] Me:  labs? Dr Janese Banks:  CBC cmp  Patient coming in at 8:15

## 2019-10-22 NOTE — Progress Notes (Signed)
Pt states that she had episode of sweating and red flushing and HA over the weekend. She took tylenol and it helped. She felt that the redness and hot feeling came from tylenol. She is on steroids and I told her that the side effects of steroids is flushing, hot flashes.  She also says that she has sores in her mouth so she did not eat well but did better this weekend. She has burning sensation in her esophagus and then comes up her mouth. She did spit up sputum and it had a blood streak in it. There was no blood clots at all and she was not coughing it up

## 2019-10-22 NOTE — Patient Instructions (Signed)
I am so sorry that you are not feeling well today.  I spoke with Dr. Janese Banks and she is in agreements with scheduling your medicines.   I recommend you begin taking your Senokot and MiraLAX in the morning and at bedtime.  If you develop diarrhea cut the dose back to once a day and only take your Senokot and MiraLAX in the morning again.  So that we can stay on top of your pain, I recommend you take half a tablet of your hydrocodone every 4-6 hours.  You could start with every 6 hours and decrease the hours if the pain is not covered.  I have prescribed you a different medication for your acid reflux.  You can pick that up at your pharmacy.  Please take that twice a day once in the morning and once at bedtime.  For your mouth sores, I would continue taking your Magic mouthwash and add in salt water gargles.  I will print off your medication list so that you know the appropriate times to take everything.  Continue your steroids as prescribed.  These may have side effects of facial flushing, increased anxiousness and an increase in your appetite.  Continue to drink plenty of water.  We would like for you to stay hydrated.  I have provided you with a few samples of electrolyte replacements that could help.  Please call us with any additional questions or concerns.  Faythe Casa, NP 10/22/2019 10:02 AM

## 2019-10-23 ENCOUNTER — Other Ambulatory Visit: Payer: Self-pay | Admitting: *Deleted

## 2019-10-23 ENCOUNTER — Ambulatory Visit
Admission: RE | Admit: 2019-10-23 | Discharge: 2019-10-23 | Disposition: A | Discharge status: Home / Self Care | Payer: Medicare Other | Source: Ambulatory Visit | Attending: Radiation Oncology | Admitting: Radiation Oncology

## 2019-10-23 ENCOUNTER — Other Ambulatory Visit: Payer: Self-pay

## 2019-10-23 ENCOUNTER — Inpatient Hospital Stay: Payer: Medicare Other

## 2019-10-23 DIAGNOSIS — Z510 Encounter for antineoplastic radiation therapy: Secondary | ICD-10-CM | POA: Diagnosis not present

## 2019-10-23 MED ORDER — DEXAMETHASONE 2 MG PO TABS: 4.0000 mg | ORAL_TABLET | Freq: Two times a day (BID) | ORAL | 0 refills | Status: DC

## 2019-10-24 ENCOUNTER — Telehealth: Payer: Self-pay | Admitting: *Deleted

## 2019-10-24 ENCOUNTER — Other Ambulatory Visit: Payer: Self-pay

## 2019-10-24 ENCOUNTER — Inpatient Hospital Stay: Payer: Medicare Other | Admitting: Occupational Therapy

## 2019-10-24 ENCOUNTER — Ambulatory Visit: Payer: Medicare Other

## 2019-10-24 ENCOUNTER — Encounter: Payer: Self-pay | Admitting: Oncology

## 2019-10-24 DIAGNOSIS — E041 Nontoxic single thyroid nodule: Secondary | ICD-10-CM | POA: Insufficient documentation

## 2019-10-24 DIAGNOSIS — G7100 Muscular dystrophy, unspecified: Secondary | ICD-10-CM | POA: Insufficient documentation

## 2019-10-24 NOTE — Telephone Encounter (Signed)
Pt's sister left message that pt is not feeling well today and having increasing mouth soreness which makes it difficult for her to eat. Stated that at her visit in [facility] this week, pt may need to switch to a different mouthwash. Pt is wishing to cancel her radiation appt today but plans to come in tomorrow for her visit. Please advise.

## 2019-10-24 NOTE — Progress Notes (Signed)
Patient stated that today she was not able to make it to her radiation treatment because it was not a good day. Patient stated that she gets headaches after radiations which was mentioned to Dr. Baruch Gouty. Patient also stated that she continues to have mouth sores and today Hayley, RN sent her a prescription for mouth wash since she is not able to eat. Patient stated that she hopes to feel better soon.

## 2019-10-25 ENCOUNTER — Inpatient Hospital Stay (HOSPITAL_BASED_OUTPATIENT_CLINIC_OR_DEPARTMENT_OTHER): Payer: Medicare Other | Admitting: Oncology

## 2019-10-25 ENCOUNTER — Ambulatory Visit
Admission: RE | Admit: 2019-10-25 | Discharge: 2019-10-25 | Disposition: A | Discharge status: Home / Self Care | Payer: Medicare Other | Source: Ambulatory Visit | Attending: Radiation Oncology | Admitting: Radiation Oncology

## 2019-10-25 ENCOUNTER — Inpatient Hospital Stay: Payer: Medicare Other

## 2019-10-25 ENCOUNTER — Other Ambulatory Visit: Payer: Self-pay

## 2019-10-25 ENCOUNTER — Inpatient Hospital Stay (HOSPITAL_BASED_OUTPATIENT_CLINIC_OR_DEPARTMENT_OTHER): Payer: Medicare Other | Admitting: Hospice and Palliative Medicine

## 2019-10-25 VITALS — BP 143/78 | HR 60 | Temp 97.8°F | Wt 132.0 lb

## 2019-10-25 DIAGNOSIS — C3431 Malignant neoplasm of lower lobe, right bronchus or lung: Secondary | ICD-10-CM | POA: Diagnosis not present

## 2019-10-25 DIAGNOSIS — C7931 Secondary malignant neoplasm of brain: Secondary | ICD-10-CM

## 2019-10-25 DIAGNOSIS — C3490 Malignant neoplasm of unspecified part of unspecified bronchus or lung: Secondary | ICD-10-CM | POA: Diagnosis not present

## 2019-10-25 DIAGNOSIS — Z515 Encounter for palliative care: Secondary | ICD-10-CM

## 2019-10-25 DIAGNOSIS — Z7189 Other specified counseling: Secondary | ICD-10-CM | POA: Diagnosis not present

## 2019-10-25 DIAGNOSIS — Z510 Encounter for antineoplastic radiation therapy: Secondary | ICD-10-CM | POA: Diagnosis not present

## 2019-10-25 NOTE — Progress Notes (Signed)
[facility]  [phone] [phone]   Name: Deborah Nguyen Date: 10/25/2019 MRN: [id number]  DOB: 27-May-1961  Patient Care Team: Tracie Harrier, MD as PCP - General (Internal Medicine) Telford Nab, RN as Registered Nurse    REASON FOR CONSULTATION: Palliative Care consult requested for this 58 y.o. 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  PMH is also notable for muscular dystrophy followed by neuromuscular clinic at [facility], anxiety, history of hypothyroidism, recurrent hyperthermia, and history of lower extremity DVT.  Patient was diagnosed with stage IV lung cancer after having right flank pain over several months.  She ultimately underwent PET/CT on 09/20/2019 with findings of hypermetabolic mass in the right lower lobe, supraclavicular/mediastinal/right hilar lymph nodes, and bilateral adrenal glands.  Patient was also found to have several brain masses on MRI.  Patient is receiving whole brain radiation.  There has been discussion about systemic treatment.  Palliative care was consulted to help address goals and manage ongoing symptoms.  SOCIAL HISTORY:     reports that she quit smoking about 3 years ago. Her smoking use included cigarettes. She smoked 1.00 pack per day. She has never used smokeless tobacco. She reports that she does not drink alcohol or use drugs.   Patient is unmarried.  She lives at home in an apartment with her twin sister as her primary caregiver.  Patient has a son and two grandchildren.  Patient is disabled from her muscular dystrophy but worked in a [employer] earlier in life.  ADVANCE DIRECTIVES:  In process of establishing  CODE STATUS:   PAST MEDICAL HISTORY: Past Medical History:  Diagnosis Date   Anxiety    Arthritis    Chronic kidney disease    Family history of breast cancer    aunt   GERD  (gastroesophageal reflux disease)    History of DVT of lower extremity    Left leg   Lung cancer ([place])    Lung mass    Malignant hyperthermia    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[place])    mild form - per patient central cord disease   Muscular dystrophy ([place])     PAST SURGICAL HISTORY:  Past Surgical History:  Procedure Laterality Date   CESAREAN SECTION  11/1985   COLONOSCOPY N/A 10/27/2015   Procedure: COLONOSCOPY;  Surgeon: Manya Silvas, MD;  Location: [facility];  Service: Endoscopy;  Laterality: N/A;   NO Propofol - per office   LITHOTRIPSY     TUBAL LIGATION  03/1986    HEMATOLOGY/ONCOLOGY HISTORY:  Oncology History  Malignant neoplasm of lower lobe of right lung ([place])  09/20/2019 Initial Diagnosis   Malignant neoplasm of lower lobe of right lung ([place])   10/05/2019 Cancer Staging   Staging form: Lung, AJCC 8th Edition - Clinical stage from 10/05/2019: Stage IV (cT2, cN2, cM1) - Signed by Sindy Guadeloupe, MD on 10/08/2019     ALLERGIES:  is allergic to paxil [paroxetine hcl]; ciprofloxacin; levaquin [levofloxacin]; propofol; serzone [nefazodone]; biaxin [clarithromycin]; erythromycin; and penicillins.  MEDICATIONS:  Current Outpatient Medications  Medication Sig Dispense Refill   acetaminophen (TYLENOL) 325 MG tablet Take 650 mg by mouth every 6 (six) hours as needed.     ALPRAZolam (XANAX) 0.25 MG tablet Take 0.25 mg by mouth at bedtime as needed for anxiety.     citalopram (CELEXA) 20 MG tablet Take 1 tablet (20 mg total) by mouth daily. [place]  tablet 2   dexamethasone (DECADRON) 2 MG tablet Take 2 tablets (4 mg total) by mouth 2 (two) times daily with a meal. First dose to start on 11/10 in the am. 40 tablet 0   esomeprazole (NEXIUM) 40 MG capsule Take 40 mg by mouth daily as needed.      fluticasone (FLONASE) 50 MCG/ACT nasal spray Place 2 sprays into both nostrils daily as needed.      magic mouthwash SOLN Take 5 mLs by mouth 3 (three) times daily as  needed for mouth pain. 300 mL 1   methimazole (TAPAZOLE) 5 MG tablet Take 5 mg by mouth daily.     metoprolol succinate (TOPROL-XL) 25 MG 24 hr tablet Take 12.5 tablets by mouth daily.     nystatin (MYCOSTATIN) 100000 UNIT/ML suspension Take 5 mLs (500,000 Units total) by mouth 4 (four) times daily. Swish and spit. 300 mL 0   ondansetron (ZOFRAN-ODT) 4 MG disintegrating tablet Take 1 tablet (4 mg total) by mouth every 8 (eight) hours as needed for nausea or vomiting. 30 tablet 2   Oxycodone HCl 10 MG TABS Take 1 tablet (10 mg total) by mouth every 4 (four) hours as needed. 60 tablet 0   pantoprazole (PROTONIX) 20 MG tablet Take 1 tablet (20 mg total) by mouth 2 (two) times daily. 120 tablet 0   prochlorperazine (COMPAZINE) 10 MG tablet Take 0.5-1 tablets (5-10 mg total) by mouth every 6 (six) hours as needed for nausea or vomiting. 30 tablet 2   vitamin B-12 (CYANOCOBALAMIN) 1000 MCG tablet Take 1 tablet (1,000 mcg total) by mouth daily. (Patient not taking: Reported on 10/24/2019) 30 tablet 1   warfarin (COUMADIN) 5 MG tablet Take 5 mg by mouth daily. Sat and sun 3 mg     No current facility-administered medications for this visit.     VITAL SIGNS: LMP 12/15/2010 (Approximate)  There were no vitals filed for this visit.  Estimated body mass index is 24.94 kg/m as calculated from the following:   Height as of 10/03/19: 5\' 1"  (1.549 m).   Weight as of an earlier encounter on 10/25/19: 132 lb (59.9 kg).  LABS: CBC:    Component Value Date/Time   WBC 15.4 (H) 10/22/2019 0902   HGB 11.4 (L) 10/22/2019 0902   HGB 13.5 11/07/2013 2128   HCT 36.5 10/22/2019 0902   HCT 39.2 11/07/2013 2128   PLT 659 (H) 10/22/2019 0902   PLT 282 11/07/2013 2128   MCV 92.9 10/22/2019 0902   MCV 96 11/07/2013 2128   NEUTROABS 13.1 (H) 10/22/2019 0902   NEUTROABS 6.8 (H) 05/24/2013 1340   LYMPHSABS 0.8 10/22/2019 0902   LYMPHSABS 1.6 05/24/2013 1340   MONOABS 1.4 (H) 10/22/2019 0902   MONOABS  0.6 05/24/2013 1340   EOSABS 0.0 10/22/2019 0902   EOSABS 0.0 05/24/2013 1340   BASOSABS 0.0 10/22/2019 0902   BASOSABS 0.0 05/24/2013 1340   Comprehensive Metabolic Panel:    Component Value Date/Time   NA 135 10/22/2019 0902   NA 140 11/07/2013 2128   K 4.1 10/22/2019 0902   K 4.2 11/07/2013 2128   CL 101 10/22/2019 0902   CL 107 11/07/2013 2128   CO2 25 10/22/2019 0902   CO2 29 11/07/2013 2128   BUN 21 (H) 10/22/2019 0902   BUN 21 (H) 11/07/2013 2128   CREATININE 0.34 (L) 10/22/2019 0902   CREATININE 0.50 (L) 11/07/2013 2128   GLUCOSE 108 (H) 10/22/2019 0902   GLUCOSE 107 (H) 11/07/2013 2128  CALCIUM 9.6 10/22/2019 0902   CALCIUM 9.3 11/07/2013 2128   AST 22 10/22/2019 0902   AST 48 (H) 11/07/2013 2128   ALT 30 10/22/2019 0902   ALT 60 11/07/2013 2128   ALKPHOS 69 10/22/2019 0902   ALKPHOS 92 11/07/2013 2128   BILITOT 0.4 10/22/2019 0902   BILITOT 0.2 11/07/2013 2128   PROT 7.9 10/22/2019 0902   PROT 7.5 11/07/2013 2128   ALBUMIN 3.5 10/22/2019 0902   ALBUMIN 3.6 11/07/2013 2128    RADIOGRAPHIC STUDIES: Dg Chest 2 View  Result Date: 10/03/2019 CLINICAL DATA:  Fever of unknown origin, history of lung mass EXAM: CHEST - 2 VIEW COMPARISON:  CT 09/13/2019 FINDINGS: Right lower lobe lung mass. Normal heart size. No pneumothorax. Mild superior endplate deformity at A83 again noted. IMPRESSION: Right lower lobe lung mass.  Negative for acute pulmonary opacity. Electronically Signed   By: Donavan Foil M.D.   On: 10/03/2019 18:12   Mr Jeri Cos MH Contrast  Result Date: 09/26/2019 CLINICAL DATA:  Staging floor are new diagnosis lung cancer. EXAM: MRI HEAD WITHOUT AND WITH CONTRAST TECHNIQUE: Multiplanar, multiecho pulse sequences of the brain and surrounding structures were obtained without and with intravenous contrast. CONTRAST:  16mL GADAVIST GADOBUTROL 1 MMOL/ML IV SOLN COMPARISON:  05/25/2017 FINDINGS: Brain: 6 tiny supratentorial brain metastases are identified. 3 mm  metastasis lateral left temporal lobe axial image 50. 3 mm right temporal metastasis axial image 61. 2 mm metastasis left temporal white matter axial image 63. 4 mm metastasis left parietooccipital junction axial image 76. 2 mm metastasis left posterior basal ganglia/radiating white matter tracts axial image 87. 3 mm metastasis right parietal cortex axial image 92. None are associated with mass effect, edema or hemorrhage. The brain otherwise appears normal except for a few punctate white matter foci consistent with minimal small vessel change. No hydrocephalus. No extra-axial collection. Vascular: Major vessels at the base of the brain show flow. Skull and upper cervical spine: Negative Sinuses/Orbits: Clear/normal Other: None IMPRESSION: Six tiny brain metastases as outlined above, all affecting the supratentorial brain. No edema, mass effect or hemorrhage. Electronically Signed   By: Nelson Chimes M.D.   On: 09/26/2019 14:42   Ct Abdomen Pelvis W Contrast  Result Date: 10/03/2019 CLINICAL DATA:  Upper abdominal pain and fevers, history of recent right adrenal biopsy EXAM: CT ABDOMEN AND PELVIS WITH CONTRAST TECHNIQUE: Multidetector CT imaging of the abdomen and pelvis was performed using the standard protocol following bolus administration of intravenous contrast. CONTRAST:  126mL OMNIPAQUE IOHEXOL 300 MG/ML  SOLN COMPARISON:  09/13/2019 FINDINGS: Lower chest: Stable right lower lobe mass lesion is noted suspicious for primary pulmonary neoplasm. Hepatobiliary: No focal liver abnormality is seen. No gallstones, gallbladder wall thickening, or biliary dilatation. Pancreas: Unremarkable. No pancreatic ductal dilatation or surrounding inflammatory changes. Spleen: Normal in size without focal abnormality. Adrenals/Urinary Tract: Adrenal glands demonstrate lesions bilaterally right slightly greater than left. The overall appearance of the lesions is stable from the prior CT examination consistent with metastatic  disease as previously proven with biopsy. The kidneys demonstrate a normal enhancement pattern. No renal calculi or obstructive changes are seen. Stable right renal cyst is noted. No obstructive changes are noted. The bladder is well distended. Stomach/Bowel: Fecal material is noted throughout the colon without obstructive change. The appendix is well visualized and within normal limits. The stomach and small bowel demonstrate no focal abnormality. Vascular/Lymphatic: Aortic atherosclerosis. No enlarged abdominal or pelvic lymph nodes. Reproductive: Uterus is within normal limits. Small  15 mm cyst is noted within the right ovary. The left adnexal region appears within normal limits. Other: No abdominal wall hernia or abnormality. No abdominopelvic ascites. Musculoskeletal: Degenerative changes of the lumbar spine are noted. Stable vertebral body height loss at T12 is noted. No acute bony abnormality is seen. IMPRESSION: Right lower lobe lung mass with biopsy-proven adrenal metastatic disease. No acute abnormality is noted to account for the current clinical history. Chronic changes as described above. Electronically Signed   By: Inez Catalina M.D.   On: 10/03/2019 23:05   Ct Biopsy  Result Date: 10/01/2019 CLINICAL DATA:  Right lower lobe lung mass, bilateral adrenal masses and metastatic mediastinal lymphadenopathy. The patient presents for biopsy of the right adrenal mass. EXAM: CT GUIDED CORE BIOPSY OF RIGHT ADRENAL MASS ANESTHESIA/SEDATION: 4.0 mg IV Versed; 100 mcg IV Fentanyl Total Moderate Sedation Time:  23 minutes. The patient's level of consciousness and physiologic status were continuously monitored during the procedure by Radiology nursing. PROCEDURE: The procedure risks, benefits, and alternatives were explained to the patient. Questions regarding the procedure were encouraged and answered. The patient understands and consents to the procedure. A time-out was performed prior to initiating the  procedure. CT was performed in a prone position followed by an oblique position with the left side raised slightly. The right flank region was prepped with chlorhexidine in a sterile fashion, and a sterile drape was applied covering the operative field. A sterile gown and sterile gloves were used for the procedure. Local anesthesia was provided with 1% Lidocaine. Under CT guidance, a 17 gauge trocar needle was advanced to the level of a right adrenal mass. After confirming needle tip position, 2 separate coaxial 18 gauge core biopsy samples were obtained. Samples were submitted in formalin. Additional CT imaging was performed prior to and following outer needle removal. COMPLICATIONS: None FINDINGS: The right adrenal gland is enlarged and engulfed by a mass measuring approximately 3.4 x 4.3 cm. Solid tissue was obtained with biopsy. At the right lung base, note is again made of a lobulated right lower lobe lung mass measuring approximately 4.4 cm. IMPRESSION: CT-guided core biopsy performed of right adrenal mass. Electronically Signed   By: Aletta Edouard M.D.   On: 10/01/2019 12:55    PERFORMANCE STATUS (ECOG) : 2 - Symptomatic, <50% confined to bed  Review of Systems Unless otherwise noted, a complete review of systems is negative.  Physical Exam General: NAD, frail appearing, thin Pulmonary: unlabored Skin: no rashes Neurological: Weakness but otherwise nonfocal  IMPRESSION: Routine follow-up visit made today.  Patient was accompanied by her twin sister and her friend.  Patient says that she has been doing about the same since last seen.  She did start RT and plans to receive chemo/immunotherapy.  Patient has had some temporary visual acuity changes in the right eye, likely attributed to radiation.  It appears that Dr. Baruch Gouty increased dose of her steroids.   Patient remains fatigued and weak.  We have made a referral for the care program but patient is unsure if she will be able to  physically make it to those appointments.  We will switch to home health PT and OT.  Pain is reportedly stable with as needed use of oxycodone.  Patient denies other distressing symptoms today.  Patient reports stable anxiety and depression.  She seems to have tolerated increased dose of citalopram.  Patient says that she intends to meet with her son to complete ACP documents.  She would want him to  serve as her decision-maker if necessary.  We did discuss CODE STATUS today.  Patient stated clearly that she would not want to be resuscitated or have her life prolonged artificially machines.  She would be in agreement with short-term hospitalization if necessary to treat the treatable.  I completed a MOST form today. The patient and family outlined their wishes for the following treatment decisions:  Cardiopulmonary Resuscitation: Do Not Attempt Resuscitation (DNR/No CPR)  Medical Interventions: Limited Additional Interventions: Use medical treatment, IV fluids and cardiac monitoring as indicated, DO NOT USE intubation or mechanical ventilation. May consider use of less invasive airway support such as BiPAP or CPAP. Also provide comfort measures. Transfer to the hospital if indicated. Avoid intensive care.   Antibiotics: Determine use of limitation of antibiotics when infection occurs  IV Fluids: IV fluids if indicated  Feeding Tube: No feeding tube   PLAN: -Continue current scope of treatment -Continue oxycodone 10 mg every 4 hours PRN -Continue citalopram to 20 mg daily - Home health PT/OT -MOST form completed -DNR/DNI -RTC in 2-3 weeks   Patient expressed understanding and was in agreement with this plan. She also understands that She can call the clinic at any time with any questions, concerns, or complaints.     Time Total: 30 minutes  Visit consisted of counseling and education dealing with the complex and emotionally intense issues of symptom management and palliative care in  the setting of serious and potentially life-threatening illness.Greater than 50%  of this time was spent counseling and coordinating care related to the above assessment and plan.  Signed by: Altha Harm, PhD, NP-C

## 2019-10-25 NOTE — Progress Notes (Signed)
Nutrition Assessment   Reason for Assessment:  Poor appetite   ASSESSMENT:  58 year old female with stage IV lung cancer with metastatic disease to brain.  Past medical history of muscular dystrophy, anxiety, hypothyroidism, recurrent hyperthermia, DVT.  Patient followed by Dr. Janese Banks and Palliative Care.    Met with patient, twin sister who she lives with and friend.  Patient reports decrease appetite.  Reports has burning, reflux sensation in stomach and esophagus and sore mouth.  Reports that she has mostly been eating eggs, bananas, yogurt, rice, broccoli, chicken cheese quesadilla.  Reports that she has tried boost and ensure and is able to drink them.    Nutrition Focused Physical Exam: deferred   Medications: protonix, magic mouthwash, decadron, nystatin, zofran, compazine, Vit B 12   Labs: glucose 108, BUN 21, creatinine 0.34   Anthropometrics:   Weight relatively stable 132 lb on 11/19.  Noted 10/15 133 lb    Estimated Energy Needs  Kcals: 1800-2100 Protein: 90-105 g Fluid: > 2 L   NUTRITION DIAGNOSIS: Inadequate oral intake relate to cancer related treatment side effects as evidenced by decreased intake from GERD and sore mouth.     INTERVENTION:  Discussed strategies to help with GERD/burning.   Encouraged good sources of protein and examples provided.   Encouraged oral nutrition supplements and case of ensure enlive provided to patient today.  Contact information given to patient.    MONITORING, EVALUATION, GOAL: Patient will consume adequate calories and protein to maintain lean muscle mass   Next Visit: Dec 7th during infusion  Dorsey Charette B. Zenia Resides, [place], [place] Registered Dietitian [phone] (pager)

## 2019-10-26 ENCOUNTER — Ambulatory Visit
Admission: RE | Admit: 2019-10-26 | Discharge: 2019-10-26 | Disposition: A | Discharge status: Home / Self Care | Payer: Medicare Other | Source: Ambulatory Visit | Attending: Radiation Oncology | Admitting: Radiation Oncology

## 2019-10-26 ENCOUNTER — Other Ambulatory Visit: Payer: Self-pay

## 2019-10-26 ENCOUNTER — Telehealth (INDEPENDENT_AMBULATORY_CARE_PROVIDER_SITE_OTHER): Payer: Self-pay

## 2019-10-26 DIAGNOSIS — Z510 Encounter for antineoplastic radiation therapy: Secondary | ICD-10-CM | POA: Diagnosis not present

## 2019-10-26 NOTE — Telephone Encounter (Signed)
Spoke with the patient's sister [name] and the patient is now scheduled for port placement on 11/05/2019 with a 10:00 am arrival time to the [facility]. Patient will do Covid testing on 11/02/2019 between 12:30-2:30 pm. Pre-procedure instructions were discussed and will be mailed to the patient.

## 2019-10-26 NOTE — Progress Notes (Signed)
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 She presented to Dr. Roselee Culver with symptoms of right flank pain which was radiating to her front which prompted a CT abdomen. CT abdomen pelvis with contrast showed a 3.7 x 2.8 x 5 cm right adrenal mass. 1.7 x 1.2 x 2.1 cm left adrenal nodule 1.8 cm right renal simple cyst. And a new 3.6 x 3.2 cm spiculated mass in the right lower lobe with tethering of the adjacent pleura. Prominent hilar lymph nodes. This was followed by a PET CT scan on 09/20/2019 which showed a 3.6 cm right lower lobe mass which was markedly hypermetabolic along with hypermetabolic metastatic disease in the right supraclavicular space as well as mediastinum and right hilum and hypermetabolic metastatic disease in bilateral adrenal glands. She will also noted to have bilateral hypermetabolic thyroid nodules and a possible nodule in the pelvis adjacent to the sigmoid colon.  Patient currently reports ongoing pain in her right flank which is relatively well controlled with hydrocodone. She also has occasional nausea for which she has as needed nausea medications. She is here with her twin sister today and is highly anxious Of note patient has a history of central core myopathy and follows up with neuromuscular clinic at [facility]. Patient states that her twin sister has had episodes of hyperthermia but patient herself has not had these episodes. I  did review [facility] neuromuscular clinic note and the hyperthermia has been attributed to her history of hyperthyroidism and thyroid dysfunction rather than malignant hyperthermia. However given her history of myopathy patient does carry risk of malignant hyperthermia.  Interval history-she is currently going through whole brain radiation and reports fatigue secondary to it.  Also reports mouth sores especially along the tongue borders which makes it difficult for her to eat.  Reportedly sores were worse a week ago and is slowly getting better.  She continues to have anxiety and is having a hard time coping up with her diagnosis  ECOG PS- 1 Pain scale- 3 Opioid associated constipation- no  Review of systems- Review of Systems  Constitutional: Positive for malaise/fatigue. Negative for chills, fever and weight loss.  HENT: Negative for congestion, ear discharge and nosebleeds.        Mouth sores  Eyes: Negative for blurred vision.  Respiratory: Negative for cough, hemoptysis, sputum production, shortness of breath and wheezing.   Cardiovascular: Negative for chest pain, palpitations, orthopnea and claudication.  Gastrointestinal: Negative for abdominal pain, blood in stool, constipation, diarrhea, heartburn, melena, nausea and vomiting.  Genitourinary: Negative for dysuria, flank pain, frequency, hematuria and urgency.  Musculoskeletal: Negative for back pain, joint pain and myalgias.  Skin: Negative for rash.  Neurological: Negative for dizziness, tingling, focal weakness, seizures, weakness and headaches.  Endo/Heme/Allergies: Does not bruise/bleed easily.  Psychiatric/Behavioral: Negative for depression and suicidal ideas. The patient is nervous/anxious. The patient does not have insomnia.       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 She has an increase risk of malignant hyperthermia with anesthesia. She should not receive Propofol.    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   Muscular dystrophy [facility])      Past Surgical History:  Procedure Laterality Date   CESAREAN SECTION  11/1985   COLONOSCOPY N/A 10/27/2015   Procedure: COLONOSCOPY;  Surgeon: Manya Silvas, MD;  Location: [facility];  Service: Endoscopy;  Laterality: N/A;   NO Propofol - per office   LITHOTRIPSY     TUBAL LIGATION  03/1986    Social History   Socioeconomic History   Marital status: Divorced    Spouse name: Not on file   Number of children: Not on file   Years of education: Not on file   Highest education level: Not on file  Occupational History   Not on file  Social Needs   Financial resource strain: Not on file   Food insecurity    Worry: Not on file    Inability: Not on file   Transportation needs    Medical: Not on file    Non-medical: Not on file  Tobacco Use   Smoking status: Former Smoker    Packs/day: 1.00    Types: Cigarettes    Quit date: 05/2016    Years since quitting: 3.4   Smokeless tobacco: Never Used  Substance and Sexual Activity   Alcohol use: No   Drug use: No   Sexual activity: Not on file  Lifestyle   Physical activity    Days per week: Not on file    Minutes per session: Not  on file   Stress: Not on file  Relationships   Social connections    Talks on phone: Not on file    Gets together: Not on file    Attends religious service: Not on file    Active member of club or organization: Not on file    Attends meetings of clubs or organizations: Not on file    Relationship status: Not on file   Intimate partner violence    Fear of current or ex partner: Not on file    Emotionally abused: Not on file    Physically abused: Not on file    Forced sexual activity: Not on file  Other Topics Concern   Not on file  Social History Narrative   Not on file    Family History  Problem Relation Age of Onset   Cancer Father        bladder, lung   Breast cancer Maternal Aunt 45     Current Outpatient Medications:  acetaminophen (TYLENOL) 325 MG tablet, Take 650 mg by mouth every 6 (six) hours as needed., Disp: , Rfl:    ALPRAZolam (XANAX) 0.25 MG tablet, Take 0.25 mg by mouth at bedtime as needed for anxiety., Disp: , Rfl:    citalopram (CELEXA) 20 MG tablet, Take 1 tablet (20 mg total) by mouth daily., Disp: 30 tablet, Rfl: 2   dexamethasone (DECADRON) 2 MG tablet, Take 2 tablets (4 mg total) by mouth 2 (two) times daily with a meal. First dose to start on 11/10 in the am., Disp: 40 tablet, Rfl: 0   fluticasone (FLONASE) 50 MCG/ACT nasal spray, Place 2 sprays into both nostrils daily as needed. , Disp: , Rfl:    magic mouthwash SOLN, Take 5 mLs by mouth 3 (three) times daily as needed for mouth pain., Disp: 300 mL, Rfl: 1   methimazole (TAPAZOLE) 5 MG tablet, Take 5 mg by mouth daily., Disp: , Rfl:    metoprolol succinate (TOPROL-XL) 25 MG 24 hr tablet, Take 12.5 tablets by mouth daily., Disp: , Rfl:    nystatin (MYCOSTATIN) 100000 UNIT/ML suspension, Take 5 mLs (500,000 Units total) by mouth 4 (four) times daily. Swish and spit., Disp: 300 mL, Rfl: 0   ondansetron (ZOFRAN-ODT) 4 MG disintegrating tablet, Take 1 tablet (4 mg total) by mouth every 8  (eight) hours as needed for nausea or vomiting., Disp: 30 tablet, Rfl: 2   Oxycodone HCl 10 MG TABS, Take 1 tablet (10 mg total) by mouth every 4 (four) hours as needed., Disp: 60 tablet, Rfl: 0   pantoprazole (PROTONIX) 20 MG tablet, Take 1 tablet (20 mg total) by mouth 2 (two) times daily., Disp: 120 tablet, Rfl: 0   prochlorperazine (COMPAZINE) 10 MG tablet, Take 0.5-1 tablets (5-10 mg total) by mouth every 6 (six) hours as needed for nausea or vomiting., Disp: 30 tablet, Rfl: 2   warfarin (COUMADIN) 5 MG tablet, Take 5 mg by mouth daily. Sat and sun 3 mg, Disp: , Rfl:    esomeprazole (NEXIUM) 40 MG capsule, Take 40 mg by mouth daily as needed. , Disp: , Rfl:    vitamin B-12 (CYANOCOBALAMIN) 1000 MCG tablet, Take 1 tablet (1,000 mcg total) by mouth daily. (Patient not taking: Reported on 10/24/2019), Disp: 30 tablet, Rfl: 1  Physical exam:  Vitals:   10/25/19 0936  BP: (!) 143/78  Pulse: 60  Temp: 97.8 F (36.6 C)  TempSrc: Tympanic  Weight: 132 lb (59.9 kg)   Physical Exam Constitutional:      General: She is not in acute distress.    Comments: Appears anxious  HENT:     Head: Normocephalic and atraumatic.     Mouth/Throat:     Comments: No evidence of thrush.  2-3 small mouth sores noted over the lateral edge of the tongue Eyes:     Pupils: Pupils are equal, round, and reactive to light.  Neck:     Musculoskeletal: Normal range of motion.  Cardiovascular:     Rate and Rhythm: Normal rate and regular rhythm.     Heart sounds: Normal heart sounds.  Pulmonary:     Effort: Pulmonary effort is normal.     Breath sounds: Normal breath sounds.  Abdominal:     General: Bowel sounds are normal.     Palpations: Abdomen is soft.  Skin:    General: Skin is warm and dry.  Neurological:     Mental Status: She is alert and oriented to person, place, and time.  CMP Latest Ref Rng & Units 10/22/2019  Glucose 70 - 99 mg/dL 108(H)  BUN 6 - 20 mg/dL 21(H)  Creatinine 0.44  - 1.00 mg/dL 0.34(L)  Sodium 135 - 145 mmol/L 135  Potassium 3.5 - 5.1 mmol/L 4.1  Chloride 98 - 111 mmol/L 101  CO2 22 - 32 mmol/L 25  Calcium 8.9 - 10.3 mg/dL 9.6  Total Protein 6.5 - 8.1 g/dL 7.9  Total Bilirubin 0.3 - 1.2 mg/dL 0.4  Alkaline Phos 38 - 126 U/L 69  AST 15 - 41 U/L 22  ALT 0 - 44 U/L 30   CBC Latest Ref Rng & Units 10/22/2019  WBC 4.0 - 10.5 K/uL 15.4(H)  Hemoglobin 12.0 - 15.0 g/dL 11.4(L)  Hematocrit 36.0 - 46.0 % 36.5  Platelets 150 - 400 K/uL 659(H)    No images are attached to the encounter.  Dg Chest 2 View  Result Date: 10/03/2019 CLINICAL DATA:  Fever of unknown origin, history of lung mass EXAM: CHEST - 2 VIEW COMPARISON:  CT 09/13/2019 FINDINGS: Right lower lobe lung mass. Normal heart size. No pneumothorax. Mild superior endplate deformity at B26 again noted. IMPRESSION: Right lower lobe lung mass.  Negative for acute pulmonary opacity. Electronically Signed   By: Donavan Foil M.D.   On: 10/03/2019 18:12   Mr Jeri Cos OM Contrast  Result Date: 09/26/2019 CLINICAL DATA:  Staging floor are new diagnosis lung cancer. EXAM: MRI HEAD WITHOUT AND WITH CONTRAST TECHNIQUE: Multiplanar, multiecho pulse sequences of the brain and surrounding structures were obtained without and with intravenous contrast. CONTRAST:  25m GADAVIST GADOBUTROL 1 MMOL/ML IV SOLN COMPARISON:  05/25/2017 FINDINGS: Brain: 6 tiny supratentorial brain metastases are identified. 3 mm metastasis lateral left temporal lobe axial image 50. 3 mm right temporal metastasis axial image 61. 2 mm metastasis left temporal white matter axial image 63. 4 mm metastasis left parietooccipital junction axial image 76. 2 mm metastasis left posterior basal ganglia/radiating white matter tracts axial image 87. 3 mm metastasis right parietal cortex axial image 92. None are associated with mass effect, edema or hemorrhage. The brain otherwise appears normal except for a few punctate white matter foci consistent with  minimal small vessel change. No hydrocephalus. No extra-axial collection. Vascular: Major vessels at the base of the brain show flow. Skull and upper cervical spine: Negative Sinuses/Orbits: Clear/normal Other: None IMPRESSION: Six tiny brain metastases as outlined above, all affecting the supratentorial brain. No edema, mass effect or hemorrhage. Electronically Signed   By: MNelson ChimesM.D.   On: 09/26/2019 14:42   Ct Abdomen Pelvis W Contrast  Result Date: 10/03/2019 CLINICAL DATA:  Upper abdominal pain and fevers, history of recent right adrenal biopsy EXAM: CT ABDOMEN AND PELVIS WITH CONTRAST TECHNIQUE: Multidetector CT imaging of the abdomen and pelvis was performed using the standard protocol following bolus administration of intravenous contrast. CONTRAST:  1027mOMNIPAQUE IOHEXOL 300 MG/ML  SOLN COMPARISON:  09/13/2019 FINDINGS: Lower chest: Stable right lower lobe mass lesion is noted suspicious for primary pulmonary neoplasm. Hepatobiliary: No focal liver abnormality is seen. No gallstones, gallbladder wall thickening, or biliary dilatation. Pancreas: Unremarkable. No pancreatic ductal dilatation or surrounding inflammatory changes. Spleen: Normal in size without focal abnormality. Adrenals/Urinary Tract: Adrenal glands demonstrate lesions bilaterally right slightly greater than left. The overall appearance of the lesions is stable from the prior CT examination consistent with metastatic disease as previously proven with biopsy. The kidneys demonstrate a normal enhancement pattern. No renal calculi or obstructive changes are seen. Stable  right renal cyst is noted. No obstructive changes are noted. The bladder is well distended. Stomach/Bowel: Fecal material is noted throughout the colon without obstructive change. The appendix is well visualized and within normal limits. The stomach and small bowel demonstrate no focal abnormality. Vascular/Lymphatic: Aortic atherosclerosis. No enlarged abdominal or  pelvic lymph nodes. Reproductive: Uterus is within normal limits. Small 15 mm cyst is noted within the right ovary. The left adnexal region appears within normal limits. Other: No abdominal wall hernia or abnormality. No abdominopelvic ascites. Musculoskeletal: Degenerative changes of the lumbar spine are noted. Stable vertebral body height loss at T12 is noted. No acute bony abnormality is seen. IMPRESSION: Right lower lobe lung mass with biopsy-proven adrenal metastatic disease. No acute abnormality is noted to account for the current clinical history. Chronic changes as described above. Electronically Signed   By: Inez Catalina M.D.   On: 10/03/2019 23:05   Ct Biopsy  Result Date: 10/01/2019 CLINICAL DATA:  Right lower lobe lung mass, bilateral adrenal masses and metastatic mediastinal lymphadenopathy. The patient presents for biopsy of the right adrenal mass. EXAM: CT GUIDED CORE BIOPSY OF RIGHT ADRENAL MASS ANESTHESIA/SEDATION: 4.0 mg IV Versed; 100 mcg IV Fentanyl Total Moderate Sedation Time:  23 minutes. The patient's level of consciousness and physiologic status were continuously monitored during the procedure by Radiology nursing. PROCEDURE: The procedure risks, benefits, and alternatives were explained to the patient. Questions regarding the procedure were encouraged and answered. The patient understands and consents to the procedure. A time-out was performed prior to initiating the procedure. CT was performed in a prone position followed by an oblique position with the left side raised slightly. The right flank region was prepped with chlorhexidine in a sterile fashion, and a sterile drape was applied covering the operative field. A sterile gown and sterile gloves were used for the procedure. Local anesthesia was provided with 1% Lidocaine. Under CT guidance, a 17 gauge trocar needle was advanced to the level of a right adrenal mass. After confirming needle tip position, 2 separate coaxial 18 gauge  core biopsy samples were obtained. Samples were submitted in formalin. Additional CT imaging was performed prior to and following outer needle removal. COMPLICATIONS: None FINDINGS: The right adrenal gland is enlarged and engulfed by a mass measuring approximately 3.4 x 4.3 cm. Solid tissue was obtained with biopsy. At the right lung base, note is again made of a lobulated right lower lobe lung mass measuring approximately 4.4 cm. IMPRESSION: CT-guided core biopsy performed of right adrenal mass. Electronically Signed   By: Aletta Edouard M.D.   On: 10/01/2019 12:55     Assessment and plan- Patient is a 58 y.o. female with squamous cell carcinoma of the lung stage IV [id number] with adrenal and brain metastases.  She is here to discuss omniseq resultsand ongoing symptom management for her mouth sores and fatigue  NGS testing was negative for ALK and Ros.  Negative for BRAF mutation.  Inadequate tissue for EGFR and PD-L1 testing.  Also MET testing was not completed.  We have therefore sent for liquid biopsy to get these additional tests as well as comprehensive NGS testing.  It is difficult to get a repeat biopsy in this patient with a history of concern for malignant hyperthermia as well as on Coumadin for anticoagulation  While we await the results of this test patient is currently completing her whole brain radiation which will be done on 11/07/2019.  I plan to tentatively start chemotherapy on 11/12/2019.  Given  the patient has been a lifetime smoker the likelihood of her having EGFR or MET exon 14 mutation is low.  I will refer her to vascular surgery for port placement.  She will also need chemotherapy teach.  Given the squamous etiology I plan to give 4 cycles of induction chemotherapy with carboplatin AUC 5, Taxol 175 mg per metered square along with Keytruda 200 mg IV every 3 weeks.  Plan to repeat CT scans in 2 weeks in 4 weeks.  If she is found to have stable disease at the end of 4 weeks I will  plan to give her maintenance Keytruda at that time until progression or toxicity.  Discussed risks and benefits of chemotherapy including all but not limited to nausea, vomiting, low blood counts, risk of infections and hospitalization.  Risk of infusion reaction peripheral neuropathy and hair loss associated with Taxol.  Risk of autoimmune side effect is autoimmune colitis, thyroiditis, need to monitor kidney and liver functions as well as pneumonitis associated with Keytruda.  We will touch base with [facility] neurology if there are any contraindications to offering immunotherapy in the setting of her central core myopathy.  Patient understands the treatment will be given with a palliative intent.  Discussed that without any active treatment her prognosis is poor less than 6 months.  With chemo/immunotherapy overall survival could be up to 1-1/2 to 2 years.  Mouth sores: I have recommended that patient should continue Magic mouthwash along with as needed oxycodone.  She can also use topical Orajel or medihoney to help with mouth sore healing   Total face to face encounter time for this patient visit was 40 min. >50% of the time was  spent in counseling and coordination of care.     Visit Diagnosis 1. Malignant neoplasm of lung, unspecified laterality, unspecified part of lung ([place])   2. Goals of care, counseling/discussion   3. Brain metastases [facility])      Dr. Randa Evens, MD, MPH [facility] at [facility] [phone] 10/26/2019 8:24 AM

## 2019-10-29 ENCOUNTER — Ambulatory Visit
Admission: RE | Admit: 2019-10-29 | Discharge: 2019-10-29 | Disposition: A | Discharge status: Home / Self Care | Payer: Medicare Other | Source: Ambulatory Visit | Attending: Radiation Oncology | Admitting: Radiation Oncology

## 2019-10-29 ENCOUNTER — Telehealth: Payer: Self-pay | Admitting: *Deleted

## 2019-10-29 ENCOUNTER — Other Ambulatory Visit: Payer: Self-pay | Admitting: *Deleted

## 2019-10-29 ENCOUNTER — Other Ambulatory Visit: Payer: Self-pay

## 2019-10-29 DIAGNOSIS — Z510 Encounter for antineoplastic radiation therapy: Secondary | ICD-10-CM | POA: Diagnosis not present

## 2019-10-29 MED ORDER — DEXAMETHASONE 2 MG PO TABS: 4.0000 mg | ORAL_TABLET | Freq: Two times a day (BID) | ORAL | 0 refills | Status: DC

## 2019-10-29 NOTE — Telephone Encounter (Signed)
120 ml

## 2019-10-29 NOTE — Telephone Encounter (Signed)
Updated prescription info received from Dr. Janese Banks verbally. Prescription for doxepin 0.5% oral rinse to take 60ml (25mg ) four times a day as needed swish and spit faxed to [facility] in [place]. Pt's sister made aware of new prescription and informed to continue [facility]'s magic mouthwash and nystatin at this time as well. Pt's sister verbalized understanding. See media tab for scanned copy of prescription faxed.   Due to drug-drug interaction between fluconazole and warfarin, fluconazole will be held at this time per Dr. Janese Banks.

## 2019-10-29 NOTE — Telephone Encounter (Signed)
Can you see if we can give her morphine mouth wash? 0.2% 15 ml swish and spit Q4 prn? She can also take oral oxycodone for pain. We can also give her fluconazole 200 mg single dose. When I saw her last week she did not have thrush.

## 2019-10-29 NOTE — Telephone Encounter (Signed)
I couldn't find the order for the morphine mouthwash but I know that we can send in viscous lidocaine 2%. Do you want me to send that in as a prescription?

## 2019-10-29 NOTE — Telephone Encounter (Signed)
She already has magic mouthwash. If she has not used that she should try that. If that's not helping we can try doxepin rinse 0.5%

## 2019-10-29 NOTE — Telephone Encounter (Signed)
Yes continue both. Doxerpin Q4 prn

## 2019-10-29 NOTE — Patient Instructions (Signed)
Pembrolizumab injection What is this medicine? PEMBROLIZUMAB (pem broe liz ue mab) is a monoclonal antibody. It is used to treat bladder cancer, cervical cancer, endometrial cancer, esophageal cancer, head and neck cancer, hepatocellular cancer, Hodgkin lymphoma, kidney cancer, lymphoma, melanoma, Merkel cell carcinoma, lung cancer, stomach cancer, urothelial cancer, and cancers that have a certain genetic condition. This medicine may be used for other purposes; ask your health care provider or pharmacist if you have questions. COMMON BRAND NAME(S): Keytruda What should I tell my health care provider before I take this medicine? They need to know if you have any of these conditions:  diabetes  immune system problems  inflammatory bowel disease  liver disease  lung or breathing disease  lupus  received or scheduled to receive an organ transplant or a stem-cell transplant that uses donor stem cells  an unusual or allergic reaction to pembrolizumab, other medicines, foods, dyes, or preservatives  pregnant or trying to get pregnant  breast-feeding How should I use this medicine? This medicine is for infusion into a vein. It is given by a health care professional in a hospital or clinic setting. A special MedGuide will be given to you before each treatment. Be sure to read this information carefully each time. Talk to your pediatrician regarding the use of this medicine in children. While this drug may be prescribed for selected conditions, precautions do apply. Overdosage: If you think you have taken too much of this medicine contact a poison control center or emergency room at once. NOTE: This medicine is only for you. Do not share this medicine with others. What if I miss a dose? It is important not to miss your dose. Call your doctor or health care professional if you are unable to keep an appointment. What may interact with this medicine? Interactions have not been studied. Give  your health care provider a list of all the medicines, herbs, non-prescription drugs, or dietary supplements you use. Also tell them if you smoke, drink alcohol, or use illegal drugs. Some items may interact with your medicine. This list may not describe all possible interactions. Give your health care provider a list of all the medicines, herbs, non-prescription drugs, or dietary supplements you use. Also tell them if you smoke, drink alcohol, or use illegal drugs. Some items may interact with your medicine. What should I watch for while using this medicine? Your condition will be monitored carefully while you are receiving this medicine. You may need blood work done while you are taking this medicine. Do not become pregnant while taking this medicine or for 4 months after stopping it. Women should inform their doctor if they wish to become pregnant or think they might be pregnant. There is a potential for serious side effects to an unborn child. Talk to your health care professional or pharmacist for more information. Do not breast-feed an infant while taking this medicine or for 4 months after the last dose. What side effects may I notice from receiving this medicine? Side effects that you should report to your doctor or health care professional as soon as possible:  allergic reactions like skin rash, itching or hives, swelling of the face, lips, or tongue  bloody or black, tarry  breathing problems  changes in vision  chest pain  chills  confusion  constipation  cough  diarrhea  dizziness or feeling faint or lightheaded  fast or irregular heartbeat  fever  flushing  hair loss  joint pain  low blood counts - this   medicine may decrease the number of white blood cells, red blood cells and platelets. You may be at increased risk for infections and bleeding.  muscle pain  muscle weakness  persistent headache  redness, blistering, peeling or loosening of the skin,  including inside the mouth  signs and symptoms of high blood sugar such as dizziness; dry mouth; dry skin; fruity breath; nausea; stomach pain; increased hunger or thirst; increased urination  signs and symptoms of kidney injury like trouble passing urine or change in the amount of urine  signs and symptoms of liver injury like dark urine, light-colored stools, loss of appetite, nausea, right upper belly pain, yellowing of the eyes or skin  sweating  swollen lymph nodes  weight loss Side effects that usually do not require medical attention (report to your doctor or health care professional if they continue or are bothersome):  decreased appetite  muscle pain  tiredness This list may not describe all possible side effects. Call your doctor for medical advice about side effects. You may report side effects to FDA at 1-800-FDA-1088. Where should I keep my medicine? This drug is given in a hospital or clinic and will not be stored at home. NOTE: This sheet is a summary. It may not cover all possible information. If you have questions about this medicine, talk to your doctor, pharmacist, or health care provider.  2020 Elsevier/Gold Standard (2018-12-19 13:46:58)  Paclitaxel injection What is this medicine? PACLITAXEL (PAK li TAX el) is a chemotherapy drug. It targets fast dividing cells, like cancer cells, and causes these cells to die. This medicine is used to treat ovarian cancer, breast cancer, lung cancer, Kaposi's sarcoma, and other cancers. This medicine may be used for other purposes; ask your health care provider or pharmacist if you have questions. COMMON BRAND NAME(S): Onxol, Taxol What should I tell my health care provider before I take this medicine? They need to know if you have any of these conditions:  history of irregular heartbeat  liver disease  low blood counts, like low white cell, platelet, or red cell counts  lung or breathing disease, like  asthma  tingling of the fingers or toes, or other nerve disorder  an unusual or allergic reaction to paclitaxel, alcohol, polyoxyethylated castor oil, other chemotherapy, other medicines, foods, dyes, or preservatives  pregnant or trying to get pregnant  breast-feeding How should I use this medicine? This drug is given as an infusion into a vein. It is administered in a hospital or clinic by a specially trained health care professional. Talk to your pediatrician regarding the use of this medicine in children. Special care may be needed. Overdosage: If you think you have taken too much of this medicine contact a poison control center or emergency room at once. NOTE: This medicine is only for you. Do not share this medicine with others. What if I miss a dose? It is important not to miss your dose. Call your doctor or health care professional if you are unable to keep an appointment. What may interact with this medicine? Do not take this medicine with any of the following medications:  disulfiram  metronidazole This medicine may also interact with the following medications:  antiviral medicines for hepatitis, HIV or AIDS  certain antibiotics like erythromycin and clarithromycin  certain medicines for fungal infections like ketoconazole and itraconazole  certain medicines for seizures like carbamazepine, phenobarbital, phenytoin  gemfibrozil  nefazodone  rifampin  St. John's wort This list may not describe all possible interactions.   Give your health care provider a list of all the medicines, herbs, non-prescription drugs, or dietary supplements you use. Also tell them if you smoke, drink alcohol, or use illegal drugs. Some items may interact with your medicine. What should I watch for while using this medicine? Your condition will be monitored carefully while you are receiving this medicine. You will need important blood work done while you are taking this medicine. This  medicine can cause serious allergic reactions. To reduce your risk you will need to take other medicine(s) before treatment with this medicine. If you experience allergic reactions like skin rash, itching or hives, swelling of the face, lips, or tongue, tell your doctor or health care professional right away. In some cases, you may be given additional medicines to help with side effects. Follow all directions for their use. This drug may make you feel generally unwell. This is not uncommon, as chemotherapy can affect healthy cells as well as cancer cells. Report any side effects. Continue your course of treatment even though you feel ill unless your doctor tells you to stop. Call your doctor or health care professional for advice if you get a fever, chills or sore throat, or other symptoms of a cold or flu. Do not treat yourself. This drug decreases your body's ability to fight infections. Try to avoid being around people who are sick. This medicine may increase your risk to bruise or bleed. Call your doctor or health care professional if you notice any unusual bleeding. Be careful brushing and flossing your teeth or using a toothpick because you may get an infection or bleed more easily. If you have any dental work done, tell your dentist you are receiving this medicine. Avoid taking products that contain aspirin, acetaminophen, ibuprofen, naproxen, or ketoprofen unless instructed by your doctor. These medicines may hide a fever. Do not become pregnant while taking this medicine. Women should inform their doctor if they wish to become pregnant or think they might be pregnant. There is a potential for serious side effects to an unborn child. Talk to your health care professional or pharmacist for more information. Do not breast-feed an infant while taking this medicine. Men are advised not to father a child while receiving this medicine. This product may contain alcohol. Ask your pharmacist or healthcare  provider if this medicine contains alcohol. Be sure to tell all healthcare providers you are taking this medicine. Certain medicines, like metronidazole and disulfiram, can cause an unpleasant reaction when taken with alcohol. The reaction includes flushing, headache, nausea, vomiting, sweating, and increased thirst. The reaction can last from 30 minutes to several hours. What side effects may I notice from receiving this medicine? Side effects that you should report to your doctor or health care professional as soon as possible:  allergic reactions like skin rash, itching or hives, swelling of the face, lips, or tongue  breathing problems  changes in vision  fast, irregular heartbeat  high or low blood pressure  mouth sores  pain, tingling, numbness in the hands or feet  signs of decreased platelets or bleeding - bruising, pinpoint red spots on the skin, black, tarry stools, blood in the urine  signs of decreased red blood cells - unusually weak or tired, feeling faint or lightheaded, falls  signs of infection - fever or chills, cough, sore throat, pain or difficulty passing urine  signs and symptoms of liver injury like dark yellow or brown urine; general ill feeling or flu-like symptoms; light-colored stools; loss of   appetite; nausea; right upper belly pain; unusually weak or tired; yellowing of the eyes or skin  swelling of the ankles, feet, hands  unusually slow heartbeat Side effects that usually do not require medical attention (report to your doctor or health care professional if they continue or are bothersome):  diarrhea  hair loss  loss of appetite  muscle or joint pain  nausea, vomiting  pain, redness, or irritation at site where injected  tiredness This list may not describe all possible side effects. Call your doctor for medical advice about side effects. You may report side effects to FDA at 1-800-FDA-1088. Where should I keep my medicine? This drug is  given in a hospital or clinic and will not be stored at home. NOTE: This sheet is a summary. It may not cover all possible information. If you have questions about this medicine, talk to your doctor, pharmacist, or health care provider.  2020 Elsevier/Gold Standard (2017-07-26 13:14:55)  Carboplatin injection What is this medicine? CARBOPLATIN (KAR boe pla tin) is a chemotherapy drug. It targets fast dividing cells, like cancer cells, and causes these cells to die. This medicine is used to treat ovarian cancer and many other cancers. This medicine may be used for other purposes; ask your health care provider or pharmacist if you have questions. COMMON BRAND NAME(S): Paraplatin What should I tell my health care provider before I take this medicine? They need to know if you have any of these conditions:  blood disorders  hearing problems  kidney disease  recent or ongoing radiation therapy  an unusual or allergic reaction to carboplatin, cisplatin, other chemotherapy, other medicines, foods, dyes, or preservatives  pregnant or trying to get pregnant  breast-feeding How should I use this medicine? This drug is usually given as an infusion into a vein. It is administered in a hospital or clinic by a specially trained health care professional. Talk to your pediatrician regarding the use of this medicine in children. Special care may be needed. Overdosage: If you think you have taken too much of this medicine contact a poison control center or emergency room at once. NOTE: This medicine is only for you. Do not share this medicine with others. What if I miss a dose? It is important not to miss a dose. Call your doctor or health care professional if you are unable to keep an appointment. What may interact with this medicine?  medicines for seizures  medicines to increase blood counts like filgrastim, pegfilgrastim, sargramostim  some antibiotics like amikacin, gentamicin, neomycin,  streptomycin, tobramycin  vaccines Talk to your doctor or health care professional before taking any of these medicines:  acetaminophen  aspirin  ibuprofen  ketoprofen  naproxen This list may not describe all possible interactions. Give your health care provider a list of all the medicines, herbs, non-prescription drugs, or dietary supplements you use. Also tell them if you smoke, drink alcohol, or use illegal drugs. Some items may interact with your medicine. What should I watch for while using this medicine? Your condition will be monitored carefully while you are receiving this medicine. You will need important blood work done while you are taking this medicine. This drug may make you feel generally unwell. This is not uncommon, as chemotherapy can affect healthy cells as well as cancer cells. Report any side effects. Continue your course of treatment even though you feel ill unless your doctor tells you to stop. In some cases, you may be given additional medicines to help with side   effects. Follow all directions for their use. Call your doctor or health care professional for advice if you get a fever, chills or sore throat, or other symptoms of a cold or flu. Do not treat yourself. This drug decreases your body's ability to fight infections. Try to avoid being around people who are sick. This medicine may increase your risk to bruise or bleed. Call your doctor or health care professional if you notice any unusual bleeding. Be careful brushing and flossing your teeth or using a toothpick because you may get an infection or bleed more easily. If you have any dental work done, tell your dentist you are receiving this medicine. Avoid taking products that contain aspirin, acetaminophen, ibuprofen, naproxen, or ketoprofen unless instructed by your doctor. These medicines may hide a fever. Do not become pregnant while taking this medicine. Women should inform their doctor if they wish to become  pregnant or think they might be pregnant. There is a potential for serious side effects to an unborn child. Talk to your health care professional or pharmacist for more information. Do not breast-feed an infant while taking this medicine. What side effects may I notice from receiving this medicine? Side effects that you should report to your doctor or health care professional as soon as possible:  allergic reactions like skin rash, itching or hives, swelling of the face, lips, or tongue  signs of infection - fever or chills, cough, sore throat, pain or difficulty passing urine  signs of decreased platelets or bleeding - bruising, pinpoint red spots on the skin, black, tarry stools, nosebleeds  signs of decreased red blood cells - unusually weak or tired, fainting spells, lightheadedness  breathing problems  changes in hearing  changes in vision  chest pain  high blood pressure  low blood counts - This drug may decrease the number of white blood cells, red blood cells and platelets. You may be at increased risk for infections and bleeding.  nausea and vomiting  pain, swelling, redness or irritation at the injection site  pain, tingling, numbness in the hands or feet  problems with balance, talking, walking  trouble passing urine or change in the amount of urine Side effects that usually do not require medical attention (report to your doctor or health care professional if they continue or are bothersome):  hair loss  loss of appetite  metallic taste in the mouth or changes in taste This list may not describe all possible side effects. Call your doctor for medical advice about side effects. You may report side effects to FDA at 1-800-FDA-1088. Where should I keep my medicine? This drug is given in a hospital or clinic and will not be stored at home. NOTE: This sheet is a summary. It may not cover all possible information. If you have questions about this medicine, talk to  your doctor, pharmacist, or health care provider.  2020 Elsevier/Gold Standard (2008-02-27 14:38:05)     

## 2019-10-29 NOTE — Telephone Encounter (Signed)
Is this what you want - 169ml (1200mg ) swish and spit every 4 hours as needed for mouth pain?

## 2019-10-29 NOTE — Telephone Encounter (Signed)
Lets do doxepin then

## 2019-10-29 NOTE — Telephone Encounter (Signed)
She has been taking nystatin and magic mouthwash as prescribed. The pharmacy does not have doxepin rinse 0.5% but they can order doxepin oral solution 10mg /ml.

## 2019-10-29 NOTE — Telephone Encounter (Signed)
Pt's sister called in to report that pt continues to have worsening mouth sores and has started to experience thick white coating in the back of her throat. Pt continues to take mouth washes as prescribed. Please advise.

## 2019-10-29 NOTE — Telephone Encounter (Signed)
How much should she take per dose? It comes as 10mg /ml.

## 2019-10-29 NOTE — Telephone Encounter (Signed)
Ok sounds good. What dose and frequency would you like? Also, should pt continue magic mouthwash and nystatin?

## 2019-10-30 ENCOUNTER — Inpatient Hospital Stay: Payer: Medicare Other

## 2019-10-30 ENCOUNTER — Ambulatory Visit
Admission: RE | Admit: 2019-10-30 | Discharge: 2019-10-30 | Disposition: A | Discharge status: Home / Self Care | Payer: Medicare Other | Source: Ambulatory Visit | Attending: Radiation Oncology | Admitting: Radiation Oncology

## 2019-10-30 ENCOUNTER — Other Ambulatory Visit: Payer: Self-pay

## 2019-10-30 ENCOUNTER — Inpatient Hospital Stay: Payer: Medicare Other | Admitting: Nurse Practitioner

## 2019-10-30 ENCOUNTER — Other Ambulatory Visit: Payer: Self-pay | Admitting: *Deleted

## 2019-10-30 DIAGNOSIS — Z299 Encounter for prophylactic measures, unspecified: Secondary | ICD-10-CM

## 2019-10-30 DIAGNOSIS — Z510 Encounter for antineoplastic radiation therapy: Secondary | ICD-10-CM | POA: Diagnosis not present

## 2019-10-30 DIAGNOSIS — C3431 Malignant neoplasm of lower lobe, right bronchus or lung: Secondary | ICD-10-CM | POA: Diagnosis not present

## 2019-10-30 MED ORDER — INFLUENZA VAC SPLIT QUAD 0.5 ML IM SUSY
0.5000 mL | PREFILLED_SYRINGE | Freq: Once | INTRAMUSCULAR | Status: AC
  Administered 2019-10-30: 0.5 mL via INTRAMUSCULAR
  Filled 2019-10-30: qty 0.5

## 2019-10-30 MED ORDER — SUCRALFATE 1 G PO TABS: 1.0000 g | ORAL_TABLET | Freq: Three times a day (TID) | ORAL | 3 refills | Status: DC

## 2019-10-30 NOTE — Progress Notes (Signed)
appt cancelled

## 2019-10-31 ENCOUNTER — Ambulatory Visit: Payer: Medicare Other

## 2019-10-31 ENCOUNTER — Ambulatory Visit
Admission: RE | Admit: 2019-10-31 | Discharge: 2019-10-31 | Disposition: A | Discharge status: Home / Self Care | Payer: Medicare Other | Source: Ambulatory Visit | Attending: Radiation Oncology | Admitting: Radiation Oncology

## 2019-10-31 ENCOUNTER — Other Ambulatory Visit: Payer: Self-pay

## 2019-10-31 DIAGNOSIS — Z510 Encounter for antineoplastic radiation therapy: Secondary | ICD-10-CM | POA: Diagnosis not present

## 2019-11-02 ENCOUNTER — Other Ambulatory Visit: Admission: RE | Admit: 2019-11-02 | Payer: Medicare Other | Source: Ambulatory Visit

## 2019-11-02 ENCOUNTER — Ambulatory Visit: Payer: Medicare Other

## 2019-11-04 ENCOUNTER — Other Ambulatory Visit (INDEPENDENT_AMBULATORY_CARE_PROVIDER_SITE_OTHER): Payer: Self-pay | Admitting: Nurse Practitioner

## 2019-11-05 ENCOUNTER — Ambulatory Visit: Payer: Medicare Other

## 2019-11-05 ENCOUNTER — Telehealth (INDEPENDENT_AMBULATORY_CARE_PROVIDER_SITE_OTHER): Payer: Self-pay

## 2019-11-05 ENCOUNTER — Other Ambulatory Visit: Payer: Medicare Other

## 2019-11-05 ENCOUNTER — Encounter: Payer: Self-pay | Admitting: Oncology

## 2019-11-05 ENCOUNTER — Other Ambulatory Visit: Payer: Self-pay | Admitting: Oncology

## 2019-11-05 DIAGNOSIS — C3431 Malignant neoplasm of lower lobe, right bronchus or lung: Secondary | ICD-10-CM

## 2019-11-05 NOTE — Progress Notes (Signed)
START ON PATHWAY REGIMEN - Non-Small Cell Lung     A cycle is every 21 days:     Pembrolizumab      Paclitaxel      Carboplatin   **Always confirm dose/schedule in your pharmacy ordering system**  Patient Characteristics: Stage IV Metastatic, Squamous, PS = 0, 1, First Line, PD-L1 Expression Positive 1-49% (TPS) / Negative / Not Tested / Awaiting Test Results and Immunotherapy Candidate AJCC T Category: T2 Current Disease Status: Distant Metastases AJCC N Category: N2 AJCC M Category: M1c AJCC 8 Stage Grouping: IVB Histology: Squamous Cell Line of therapy: First Line ECOG Performance Status: 1 PD-L1 Expression Status: Awaiting Test Results Immunotherapy Candidate Status: Candidate for Immunotherapy Intent of Therapy: Non-Curative / Palliative Intent, Discussed with Patient

## 2019-11-05 NOTE — Telephone Encounter (Signed)
Patient's sister called and the patient is rescheduled to 11/09/2019 with Dr. Lucky Cowboy with a 1:30 pm arrival time to the [facility]. Patient will do Covid testing on 11/06/2019 between 12:30-2:30 pm at the [place]. Patient's sister will stop by the office to pick up the pre-procedure information sheet.

## 2019-11-06 ENCOUNTER — Other Ambulatory Visit: Payer: Self-pay

## 2019-11-06 ENCOUNTER — Other Ambulatory Visit
Admission: RE | Admit: 2019-11-06 | Discharge: 2019-11-06 | Disposition: A | Discharge status: Home / Self Care | Payer: Medicare Other | Source: Ambulatory Visit | Attending: Vascular Surgery | Admitting: Vascular Surgery

## 2019-11-06 ENCOUNTER — Ambulatory Visit
Admission: RE | Admit: 2019-11-06 | Discharge: 2019-11-06 | Disposition: A | Discharge status: Home / Self Care | Payer: Medicare Other | Source: Ambulatory Visit | Attending: Radiation Oncology | Admitting: Radiation Oncology

## 2019-11-06 ENCOUNTER — Ambulatory Visit: Payer: Medicare Other

## 2019-11-06 ENCOUNTER — Other Ambulatory Visit: Payer: Self-pay | Admitting: Oncology

## 2019-11-06 DIAGNOSIS — C3431 Malignant neoplasm of lower lobe, right bronchus or lung: Secondary | ICD-10-CM | POA: Diagnosis not present

## 2019-11-06 DIAGNOSIS — Z510 Encounter for antineoplastic radiation therapy: Secondary | ICD-10-CM | POA: Insufficient documentation

## 2019-11-06 DIAGNOSIS — C7931 Secondary malignant neoplasm of brain: Secondary | ICD-10-CM | POA: Insufficient documentation

## 2019-11-06 DIAGNOSIS — Z01812 Encounter for preprocedural laboratory examination: Secondary | ICD-10-CM | POA: Insufficient documentation

## 2019-11-06 DIAGNOSIS — Z20828 Contact with and (suspected) exposure to other viral communicable diseases: Secondary | ICD-10-CM | POA: Insufficient documentation

## 2019-11-06 LAB — SARS CORONAVIRUS 2 (TAT 6-24 HRS): SARS Coronavirus 2: NEGATIVE

## 2019-11-06 MED ORDER — DEXAMETHASONE 4 MG PO TABS: 8.0000 mg | ORAL_TABLET | Freq: Every day | ORAL | 1 refills | Status: DC

## 2019-11-06 MED ORDER — ONDANSETRON HCL 8 MG PO TABS: 8.0000 mg | ORAL_TABLET | Freq: Two times a day (BID) | ORAL | 1 refills | Status: DC | PRN

## 2019-11-06 MED ORDER — LIDOCAINE-PRILOCAINE 2.5-2.5 % EX CREA: TOPICAL_CREAM | CUTANEOUS | 3 refills | Status: DC

## 2019-11-06 MED ORDER — PROCHLORPERAZINE MALEATE 10 MG PO TABS: 10.0000 mg | ORAL_TABLET | Freq: Four times a day (QID) | ORAL | 1 refills | Status: DC | PRN

## 2019-11-07 ENCOUNTER — Other Ambulatory Visit: Payer: Self-pay

## 2019-11-07 ENCOUNTER — Ambulatory Visit
Admission: RE | Admit: 2019-11-07 | Discharge: 2019-11-07 | Disposition: A | Discharge status: Home / Self Care | Payer: Medicare Other | Source: Ambulatory Visit | Attending: Radiation Oncology | Admitting: Radiation Oncology

## 2019-11-07 DIAGNOSIS — Z510 Encounter for antineoplastic radiation therapy: Secondary | ICD-10-CM | POA: Diagnosis not present

## 2019-11-08 ENCOUNTER — Inpatient Hospital Stay: Payer: Medicare Other | Attending: Oncology | Admitting: Oncology

## 2019-11-08 ENCOUNTER — Telehealth: Payer: Self-pay | Admitting: *Deleted

## 2019-11-08 ENCOUNTER — Other Ambulatory Visit: Payer: Self-pay

## 2019-11-08 ENCOUNTER — Ambulatory Visit
Admission: RE | Admit: 2019-11-08 | Discharge: 2019-11-08 | Disposition: A | Discharge status: Home / Self Care | Payer: Medicare Other | Source: Ambulatory Visit | Attending: Oncology | Admitting: Oncology

## 2019-11-08 ENCOUNTER — Encounter: Payer: Self-pay | Admitting: Oncology

## 2019-11-08 ENCOUNTER — Ambulatory Visit
Admission: RE | Admit: 2019-11-08 | Discharge: 2019-11-08 | Disposition: A | Discharge status: Home / Self Care | Payer: Medicare Other | Source: Ambulatory Visit | Attending: Radiation Oncology | Admitting: Radiation Oncology

## 2019-11-08 VITALS — BP 130/68 | HR 61 | Temp 98.5°F | Resp 16 | Wt 131.5 lb

## 2019-11-08 DIAGNOSIS — J029 Acute pharyngitis, unspecified: Secondary | ICD-10-CM | POA: Diagnosis not present

## 2019-11-08 DIAGNOSIS — C7972 Secondary malignant neoplasm of left adrenal gland: Secondary | ICD-10-CM

## 2019-11-08 DIAGNOSIS — C7971 Secondary malignant neoplasm of right adrenal gland: Secondary | ICD-10-CM | POA: Insufficient documentation

## 2019-11-08 DIAGNOSIS — Z66 Do not resuscitate: Secondary | ICD-10-CM | POA: Diagnosis not present

## 2019-11-08 DIAGNOSIS — K219 Gastro-esophageal reflux disease without esophagitis: Secondary | ICD-10-CM | POA: Insufficient documentation

## 2019-11-08 DIAGNOSIS — C3431 Malignant neoplasm of lower lobe, right bronchus or lung: Secondary | ICD-10-CM | POA: Diagnosis present

## 2019-11-08 DIAGNOSIS — R51 Headache with orthostatic component, not elsewhere classified: Secondary | ICD-10-CM | POA: Diagnosis not present

## 2019-11-08 DIAGNOSIS — Z5111 Encounter for antineoplastic chemotherapy: Secondary | ICD-10-CM | POA: Insufficient documentation

## 2019-11-08 DIAGNOSIS — R110 Nausea: Secondary | ICD-10-CM | POA: Diagnosis not present

## 2019-11-08 DIAGNOSIS — Z87891 Personal history of nicotine dependence: Secondary | ICD-10-CM | POA: Insufficient documentation

## 2019-11-08 DIAGNOSIS — F419 Anxiety disorder, unspecified: Secondary | ICD-10-CM | POA: Insufficient documentation

## 2019-11-08 DIAGNOSIS — M79605 Pain in left leg: Secondary | ICD-10-CM | POA: Diagnosis not present

## 2019-11-08 DIAGNOSIS — F329 Major depressive disorder, single episode, unspecified: Secondary | ICD-10-CM | POA: Insufficient documentation

## 2019-11-08 DIAGNOSIS — M7989 Other specified soft tissue disorders: Secondary | ICD-10-CM | POA: Insufficient documentation

## 2019-11-08 DIAGNOSIS — K5900 Constipation, unspecified: Secondary | ICD-10-CM | POA: Diagnosis not present

## 2019-11-08 DIAGNOSIS — G7129 Other congenital myopathy: Secondary | ICD-10-CM | POA: Diagnosis not present

## 2019-11-08 DIAGNOSIS — C7931 Secondary malignant neoplasm of brain: Secondary | ICD-10-CM | POA: Insufficient documentation

## 2019-11-08 DIAGNOSIS — G893 Neoplasm related pain (acute) (chronic): Secondary | ICD-10-CM | POA: Insufficient documentation

## 2019-11-08 DIAGNOSIS — Z86718 Personal history of other venous thrombosis and embolism: Secondary | ICD-10-CM | POA: Insufficient documentation

## 2019-11-08 DIAGNOSIS — C781 Secondary malignant neoplasm of mediastinum: Secondary | ICD-10-CM | POA: Insufficient documentation

## 2019-11-08 DIAGNOSIS — Z79899 Other long term (current) drug therapy: Secondary | ICD-10-CM | POA: Insufficient documentation

## 2019-11-08 DIAGNOSIS — Z510 Encounter for antineoplastic radiation therapy: Secondary | ICD-10-CM | POA: Diagnosis not present

## 2019-11-08 MED ORDER — APIXABAN (ELIQUIS) VTE STARTER PACK (10MG AND 5MG): ORAL_TABLET | ORAL | 0 refills | Status: DC

## 2019-11-08 NOTE — Patient Instructions (Signed)
A prescription for eliquis has been sent into your pharmacy. You can start this medication Friday evening.

## 2019-11-08 NOTE — Progress Notes (Signed)
Pt says leg at ankle area is swollen and the shin tender

## 2019-11-08 NOTE — Telephone Encounter (Signed)
Spoke with pt's sister who informed that pt started to have left leg swelling with tenderness. 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 Per Dr. Janese Banks, pt can be added to her schedule today for evaluation and to be scheduled for US doppler. Pt's sister made aware that she will be seen after radiation today. Pt's sister verbalized understanding.

## 2019-11-09 ENCOUNTER — Ambulatory Visit
Admission: RE | Admit: 2019-11-09 | Discharge: 2019-11-09 | Disposition: A | Discharge status: Home / Self Care | Payer: Medicare Other | Attending: Vascular Surgery | Admitting: Vascular Surgery

## 2019-11-09 ENCOUNTER — Encounter
Admission: RE | Disposition: A | Discharge status: Home / Self Care | Payer: Self-pay | Source: Home / Self Care | Attending: Vascular Surgery

## 2019-11-09 ENCOUNTER — Other Ambulatory Visit: Payer: Self-pay

## 2019-11-09 DIAGNOSIS — Z87891 Personal history of nicotine dependence: Secondary | ICD-10-CM | POA: Diagnosis not present

## 2019-11-09 DIAGNOSIS — C7931 Secondary malignant neoplasm of brain: Secondary | ICD-10-CM | POA: Insufficient documentation

## 2019-11-09 DIAGNOSIS — M1990 Unspecified osteoarthritis, unspecified site: Secondary | ICD-10-CM | POA: Diagnosis not present

## 2019-11-09 DIAGNOSIS — C7970 Secondary malignant neoplasm of unspecified adrenal gland: Secondary | ICD-10-CM | POA: Insufficient documentation

## 2019-11-09 DIAGNOSIS — K219 Gastro-esophageal reflux disease without esophagitis: Secondary | ICD-10-CM | POA: Diagnosis not present

## 2019-11-09 DIAGNOSIS — F419 Anxiety disorder, unspecified: Secondary | ICD-10-CM | POA: Insufficient documentation

## 2019-11-09 DIAGNOSIS — Z7901 Long term (current) use of anticoagulants: Secondary | ICD-10-CM | POA: Diagnosis not present

## 2019-11-09 DIAGNOSIS — C3490 Malignant neoplasm of unspecified part of unspecified bronchus or lung: Secondary | ICD-10-CM | POA: Diagnosis present

## 2019-11-09 DIAGNOSIS — Z79899 Other long term (current) drug therapy: Secondary | ICD-10-CM | POA: Insufficient documentation

## 2019-11-09 DIAGNOSIS — G7100 Muscular dystrophy, unspecified: Secondary | ICD-10-CM | POA: Insufficient documentation

## 2019-11-09 HISTORY — PX: PORTA CATH INSERTION: CATH118285

## 2019-11-09 LAB — PROTIME-INR
INR: 1.3 — ABNORMAL HIGH (ref 0.8–1.2)
Prothrombin Time: 16.4 seconds — ABNORMAL HIGH (ref 11.4–15.2)

## 2019-11-09 SURGERY — PORTA CATH INSERTION
Anesthesia: Moderate Sedation

## 2019-11-09 MED ORDER — HYDROMORPHONE HCL 1 MG/ML IJ SOLN: 1.0000 mg | Freq: Once | INTRAMUSCULAR | Status: DC | PRN

## 2019-11-09 MED ORDER — SODIUM CHLORIDE 0.9 % IV SOLN
Freq: Once | INTRAVENOUS | Status: DC
  Filled 2019-11-09: qty 2

## 2019-11-09 MED ORDER — CLINDAMYCIN PHOSPHATE 300 MG/50ML IV SOLN
300.0000 mg | Freq: Once | INTRAVENOUS | Status: AC
  Administered 2019-11-09: 300 mg via INTRAVENOUS

## 2019-11-09 MED ORDER — MIDAZOLAM HCL 2 MG/2ML IJ SOLN
INTRAMUSCULAR | Status: DC | PRN
  Administered 2019-11-09: 1 mg via INTRAVENOUS
  Administered 2019-11-09: 2 mg via INTRAVENOUS
  Administered 2019-11-09 (×2): 1 mg via INTRAVENOUS

## 2019-11-09 MED ORDER — FENTANYL CITRATE (PF) 100 MCG/2ML IJ SOLN
INTRAMUSCULAR | Status: AC
  Filled 2019-11-09: qty 2

## 2019-11-09 MED ORDER — FAMOTIDINE 20 MG PO TABS: 40.0000 mg | ORAL_TABLET | Freq: Once | ORAL | Status: DC | PRN

## 2019-11-09 MED ORDER — SODIUM CHLORIDE 0.9 % IV SOLN
INTRAVENOUS | Status: DC
  Administered 2019-11-09: 15:00:00 via INTRAVENOUS

## 2019-11-09 MED ORDER — MIDAZOLAM HCL 5 MG/5ML IJ SOLN
INTRAMUSCULAR | Status: AC
  Filled 2019-11-09: qty 5

## 2019-11-09 MED ORDER — ONDANSETRON HCL 4 MG/2ML IJ SOLN: 4.0000 mg | Freq: Four times a day (QID) | INTRAMUSCULAR | Status: DC | PRN

## 2019-11-09 MED ORDER — MIDAZOLAM HCL 2 MG/ML PO SYRP: 8.0000 mg | ORAL_SOLUTION | Freq: Once | ORAL | Status: DC | PRN

## 2019-11-09 MED ORDER — CLINDAMYCIN PHOSPHATE 300 MG/50ML IV SOLN
INTRAVENOUS | Status: AC
  Filled 2019-11-09: qty 50

## 2019-11-09 MED ORDER — DIPHENHYDRAMINE HCL 50 MG/ML IJ SOLN: 50.0000 mg | Freq: Once | INTRAMUSCULAR | Status: DC | PRN

## 2019-11-09 MED ORDER — FENTANYL CITRATE (PF) 100 MCG/2ML IJ SOLN
INTRAMUSCULAR | Status: DC | PRN
  Administered 2019-11-09: 25 ug via INTRAVENOUS
  Administered 2019-11-09: 50 ug via INTRAVENOUS
  Administered 2019-11-09 (×2): 12.5 ug via INTRAVENOUS

## 2019-11-09 MED ORDER — METHYLPREDNISOLONE SODIUM SUCC 125 MG IJ SOLR: 125.0000 mg | Freq: Once | INTRAMUSCULAR | Status: DC | PRN

## 2019-11-09 SURGICAL SUPPLY — 10 items
DERMABOND ADVANCED (GAUZE/BANDAGES/DRESSINGS) ×2
DERMABOND ADVANCED .7 DNX12 (GAUZE/BANDAGES/DRESSINGS) ×1 IMPLANT
KIT PORT POWER 8FR ISP CVUE (Port) ×3 IMPLANT
PACK ANGIOGRAPHY (CUSTOM PROCEDURE TRAY) ×3 IMPLANT
PENCIL ELECTRO HAND CTR (MISCELLANEOUS) ×3 IMPLANT
SPONGE XRAY 4X4 16PLY STRL (MISCELLANEOUS) ×3 IMPLANT
SUT MNCRL AB 4-0 PS2 18 (SUTURE) ×3 IMPLANT
SUT PROLENE 0 CT 1 30 (SUTURE) ×3 IMPLANT
SUT VIC AB 3-0 CT1 27 (SUTURE) ×2
SUT VIC AB 3-0 CT1 TAPERPNT 27 (SUTURE) ×1 IMPLANT

## 2019-11-09 NOTE — Progress Notes (Signed)
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 She presented to Dr. Ginette Pitman with symptoms of right flank pain which was radiating to her front which prompted a CT abdomen. CT abdomen pelvis with contrast showed a 3.7 x 2.8 x 5 cm right adrenal mass. 1.7 x 1.2 x 2.1 cm left adrenal nodule 1.8 cm right renal simple cyst. And a new 3.6 x 3.2 cm spiculated mass in the right lower lobe with tethering of the adjacent pleura. Prominent hilar lymph nodes. This was followed by a PET CT scan on 09/20/2019 which showed a 3.6 cm right lower lobe mass which was markedly hypermetabolic along with hypermetabolic metastatic disease in the right supraclavicular space as well as mediastinum and right hilum and hypermetabolic metastatic disease in bilateral adrenal glands. She will also noted to have bilateral hypermetabolic thyroid nodules and a possible nodule in the pelvis adjacent to the sigmoid colon.  Patient currently reports ongoing pain in her right flank which is relatively well controlled with hydrocodone. She also has occasional nausea for which she has as needed nausea medications. She is here with her twin sister today and is highly anxious Of note patient has a history of central core myopathy and follows up with neuromuscular clinic at [facility]. Patient states that her twin sister has had episodes of hyperthermia but patient herself has not had these  episodes. I did review [facility] neuromuscular clinic note and the hyperthermia has been attributed to her history of hyperthyroidism and thyroid dysfunction rather than malignant hyperthermia. However given her history of myopathy patient does carry risk of malignant hyperthermia.  Interval history-patient presents for an acute visit for left lower extremity pain and swelling that has been ongoing for the last 3 to 4 days.  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  She has chronic fatigue which is stable.  Reports that her mouth pain and mouth sores is slowly improving and she is able to eat better.  Flank pain and chest wall pain are well controlled and she is rarely using her opioids.  Bowel movements are more regular with MiraLAX and senna.  ECOG PS- 1 Pain scale- 0 Opioid associated constipation- no  Review of systems- Review of Systems  Constitutional: Positive for malaise/fatigue. Negative for chills, fever and weight loss.  HENT: Negative for congestion, ear discharge and nosebleeds.   Eyes: Negative for blurred vision.  Respiratory: Negative for cough, hemoptysis, sputum production, shortness of breath and wheezing.   Cardiovascular: Negative for chest pain, palpitations, orthopnea and claudication.  Gastrointestinal: Negative for abdominal pain, blood in stool, constipation, diarrhea, heartburn, melena, nausea and vomiting.  Genitourinary: Negative for dysuria, flank pain, frequency, hematuria and urgency.  Musculoskeletal: Negative for back pain, joint pain and myalgias.       Left lower extremity pain and swelling  Skin: Negative for rash.  Neurological: Negative for dizziness,  tingling, focal weakness, seizures, weakness and headaches.  Endo/Heme/Allergies: Does not bruise/bleed easily.  Psychiatric/Behavioral: Negative for depression and suicidal ideas. The patient does not have insomnia.        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 She has an increase risk of malignant hyperthermia with anesthesia. She should not receive Propofol.    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   Muscular dystrophy [facility])      Past Surgical History:  Procedure Laterality Date   CESAREAN SECTION  11/1985   COLONOSCOPY N/A 10/27/2015   Procedure: COLONOSCOPY;  Surgeon: Manya Silvas, MD;  Location: [place];  Service: Endoscopy;  Laterality: N/A;   NO Propofol - per office   LITHOTRIPSY     TUBAL LIGATION  03/1986    Social History   Socioeconomic History   Marital status: Divorced    Spouse name: Not on file   Number of children: Not on file   Years of education: Not on file   Highest education level: Not on file  Occupational History   Not on file  Social Needs   Financial resource strain: Not on file   Food insecurity    Worry: Not on file    Inability: Not on file   Transportation needs    Medical: Not on file    Non-medical: Not on file  Tobacco Use   Smoking status: Former Smoker    Packs/day: 1.00    Types: Cigarettes    Quit date: 05/2016    Years since quitting: 3.5    Smokeless tobacco: Never Used  Substance and Sexual Activity   Alcohol use: No   Drug use: No   Sexual activity: Not on file  Lifestyle   Physical activity    Days per week: Not on file    Minutes per session: Not on file   Stress: Not on file  Relationships   Social connections    Talks on phone: Not on file    Gets together: Not on file    Attends religious service: Not on file    Active member of club or organization: Not on file    Attends meetings of clubs or organizations: Not on file    Relationship status: Not on file   Intimate partner violence    Fear of current or ex partner: Not on file    Emotionally abused: Not on file    Physically abused: Not on file    Forced sexual activity: Not on file  Other Topics Concern  Not on file  Social History Narrative   Not on file    Family History  Problem Relation Age of Onset   Cancer Father        bladder, lung   Breast cancer Maternal Aunt 45     Current Outpatient Medications:    acetaminophen (TYLENOL) 325 MG tablet, Take 650 mg by mouth every 6 (six) hours as needed., Disp: , Rfl:    ALPRAZolam (XANAX) 0.25 MG tablet, Take 0.25 mg by mouth at bedtime as needed for anxiety., Disp: , Rfl:    citalopram (CELEXA) 20 MG tablet, Take 1 tablet (20 mg total) by mouth daily., Disp: 30 tablet, Rfl: 2   dexamethasone (DECADRON) 2 MG tablet, Take 2 tablets (4 mg total) by mouth 2 (two) times daily with a meal. First dose to start on 11/10 in the am., Disp: 60 tablet, Rfl: 0   fluticasone (FLONASE) 50 MCG/ACT nasal spray, Place 2 sprays into both nostrils daily as needed. , Disp: , Rfl:    magic mouthwash SOLN, Take 5 mLs by mouth 3 (three) times daily as needed for mouth pain., Disp: 300 mL, Rfl: 1   methimazole (TAPAZOLE) 5 MG tablet, Take 5 mg by mouth daily., Disp: , Rfl:    metoprolol succinate (TOPROL-XL) 25 MG 24 hr tablet, Take 12.5 tablets by mouth daily., Disp: , Rfl:    nystatin (MYCOSTATIN)  100000 UNIT/ML suspension, Take 5 mLs (500,000 Units total) by mouth 4 (four) times daily. Swish and spit., Disp: 300 mL, Rfl: 0   ondansetron (ZOFRAN-ODT) 4 MG disintegrating tablet, Take 1 tablet (4 mg total) by mouth every 8 (eight) hours as needed for nausea or vomiting., Disp: 30 tablet, Rfl: 2   Oxycodone HCl 10 MG TABS, Take 1 tablet (10 mg total) by mouth every 4 (four) hours as needed., Disp: 60 tablet, Rfl: 0   pantoprazole (PROTONIX) 20 MG tablet, Take 1 tablet (20 mg total) by mouth 2 (two) times daily., Disp: 120 tablet, Rfl: 0   prochlorperazine (COMPAZINE) 10 MG tablet, Take 0.5-1 tablets (5-10 mg total) by mouth every 6 (six) hours as needed for nausea or vomiting., Disp: 30 tablet, Rfl: 2   sucralfate (CARAFATE) 1 g tablet, Take 1 tablet (1 g total) by mouth 3 (three) times daily. Dissolve in 3-4 tbsp warm water, swish and swallow., Disp: 90 tablet, Rfl: 3   Apixaban Starter Pack (ELIQUIS STARTER PACK) 5 MG TBPK, Take as directed on package: start with two-5mg  tablets twice daily for 7 days. On day 8, switch to one-5mg  tablet twice daily., Disp: 1 each, Rfl: 0   dexamethasone (DECADRON) 4 MG tablet, Take 2 tablets (8 mg total) by mouth daily. Start the day after carboplatin chemotherapy for 3 days. (Patient not taking: Reported on 11/08/2019), Disp: 30 tablet, Rfl: 1   lidocaine-prilocaine (EMLA) cream, Apply to affected area once (Patient not taking: Reported on 11/08/2019), Disp: 30 g, Rfl: 3   ondansetron (ZOFRAN) 8 MG tablet, Take 1 tablet (8 mg total) by mouth 2 (two) times daily as needed for refractory nausea / vomiting. Start on day 3 after carboplatin chemo. (Patient not taking: Reported on 11/08/2019), Disp: 30 tablet, Rfl: 1   prochlorperazine (COMPAZINE) 10 MG tablet, Take 1 tablet (10 mg total) by mouth every 6 (six) hours as needed (Nausea or vomiting). (Patient not taking: Reported on 11/08/2019), Disp: 30 tablet, Rfl: 1   vitamin B-12 (CYANOCOBALAMIN) 1000 MCG  tablet, Take 1 tablet (1,000 mcg total) by mouth daily. (  Patient not taking: Reported on 10/24/2019), Disp: 30 tablet, Rfl: 1  Physical exam:  Vitals:   11/08/19 0946  BP: 130/68  Pulse: 61  Resp: 16  Temp: 98.5 F (36.9 C)  Weight: 131 lb 8 oz (59.6 kg)   Physical Exam HENT:     Head: Normocephalic and atraumatic.  Eyes:     Pupils: Pupils are equal, round, and reactive to light.  Neck:     Musculoskeletal: Normal range of motion.  Cardiovascular:     Rate and Rhythm: Normal rate and regular rhythm.     Heart sounds: Normal heart sounds.  Pulmonary:     Effort: Pulmonary effort is normal.     Breath sounds: Normal breath sounds.  Abdominal:     General: Bowel sounds are normal.     Palpations: Abdomen is soft.  Musculoskeletal:     Comments: There is trace edema noted in the left lower extremity and tenderness to palpation over the left calf.  I do not appreciate any overt swelling of her left leg.  Skin:    General: Skin is warm and dry.  Neurological:     Mental Status: She is alert and oriented to person, place, and time.      CMP Latest Ref Rng & Units 10/22/2019  Glucose 70 - 99 mg/dL 108(H)  BUN 6 - 20 mg/dL 21(H)  Creatinine 0.44 - 1.00 mg/dL 0.34(L)  Sodium 135 - 145 mmol/L 135  Potassium 3.5 - 5.1 mmol/L 4.1  Chloride 98 - 111 mmol/L 101  CO2 22 - 32 mmol/L 25  Calcium 8.9 - 10.3 mg/dL 9.6  Total Protein 6.5 - 8.1 g/dL 7.9  Total Bilirubin 0.3 - 1.2 mg/dL 0.4  Alkaline Phos 38 - 126 U/L 69  AST 15 - 41 U/L 22  ALT 0 - 44 U/L 30   CBC Latest Ref Rng & Units 10/22/2019  WBC 4.0 - 10.5 K/uL 15.4(H)  Hemoglobin 12.0 - 15.0 g/dL 11.4(L)  Hematocrit 36.0 - 46.0 % 36.5  Platelets 150 - 400 K/uL 659(H)    No images are attached to the encounter.  US Venous Img Lower Unilateral Left  Result Date: 11/08/2019 CLINICAL DATA:  58 year old female with left lower extremity swelling for 1 week EXAM: LEFT LOWER EXTREMITY VENOUS DOPPLER ULTRASOUND TECHNIQUE:  Gray-scale sonography with graded compression, as well as color Doppler and duplex ultrasound were performed to evaluate the lower extremity deep venous systems from the level of the common femoral vein and including the common femoral, femoral, profunda femoral, popliteal and calf veins including the posterior tibial, peroneal and gastrocnemius veins when visible. The superficial great saphenous vein was also interrogated. Spectral Doppler was utilized to evaluate flow at rest and with distal augmentation maneuvers in the common femoral, femoral and popliteal veins. COMPARISON:  None. FINDINGS: Contralateral Common Femoral Vein: Respiratory phasicity is normal and symmetric with the symptomatic side. No evidence of thrombus. Normal compressibility. Common Femoral Vein: No evidence of thrombus. Normal compressibility, respiratory phasicity and response to augmentation. Saphenofemoral Junction: No evidence of thrombus. Normal compressibility and flow on color Doppler imaging. Profunda Femoral Vein: No evidence of thrombus. Normal compressibility and flow on color Doppler imaging. Femoral Vein: No evidence of thrombus. Normal compressibility, respiratory phasicity and response to augmentation. Popliteal Vein: No evidence of thrombus. Normal compressibility, respiratory phasicity and response to augmentation. Calf Veins: No evidence of thrombus. Normal compressibility and flow on color Doppler imaging. Superficial Great Saphenous Vein: No evidence of thrombus. Normal compressibility. Venous  Reflux:  None. Other Findings:  None. IMPRESSION: No evidence of deep venous thrombosis. Electronically Signed   By: Jacqulynn Cadet M.D.   On: 11/08/2019 13:54     Assessment and plan- Patient is a 58 y.o. female with squamous cell carcinoma of the lung stage IV [id number] with adrenal and brain metastases.    She is here for acute visit for ongoing left lower extremity pain and swelling  On physical exam there is trace  edema but no significant swelling of her left lower extremity.  Given that she is off Coumadin for 5 days for port placement, I would like to rule out a recurrent DVT and I have therefore ordered a left lower extremity ultrasound which did come back negative for DVT.  He will be switching over from Coumadin to Eliquis post port placement starting tomorrow  I am still awaiting formal confirmation from [facility] neurology there would be okay for her to go on immunotherapy with her central core myopathy  I am tentatively seeing her next week to start her first cycle of carbotaxol Keytruda    Visit Diagnosis 1. History of DVT of lower extremity      Dr. Randa Evens, MD, MPH [facility] [phone] 11/09/2019 9:39 AM

## 2019-11-09 NOTE — H&P (Signed)
[facility] History & Physical Update  The patient was interviewed and re-examined.  The patient's previous History and Physical has been reviewed and is unchanged.  There is no change in the plan of care. We plan to proceed with the scheduled procedure.  Leotis Pain, MD  11/09/2019, 2:48 PM

## 2019-11-09 NOTE — Discharge Instructions (Signed)
Moderate Conscious Sedation, Adult, Care After °These instructions provide you with information about caring for yourself after your procedure. Your health care provider may also give you more specific instructions. Your treatment has been planned according to current medical practices, but problems sometimes occur. Call your health care provider if you have any problems or questions after your procedure. °What can I expect after the procedure? °After your procedure, it is common: °· To feel sleepy for several hours. °· To feel clumsy and have poor balance for several hours. °· To have poor judgment for several hours. °· To vomit if you eat too soon. °Follow these instructions at home: °For at least 24 hours after the procedure: ° °· Do not: °? Participate in activities where you could fall or become injured. °? Drive. °? Use heavy machinery. °? Drink alcohol. °? Take sleeping pills or medicines that cause drowsiness. °? Make important decisions or sign legal documents. °? Take care of children on your own. °· Rest. °Eating and drinking °· Follow the diet recommended by your health care provider. °· If you vomit: °? Drink water, juice, or soup when you can drink without vomiting. °? Make sure you have little or no nausea before eating solid foods. °General instructions °· Have a responsible adult stay with you until you are awake and alert. °· Take over-the-counter and prescription medicines only as told by your health care provider. °· If you smoke, do not smoke without supervision. °· Keep all follow-up visits as told by your health care provider. This is important. °Contact a health care provider if: °· You keep feeling nauseous or you keep vomiting. °· You feel light-headed. °· You develop a rash. °· You have a fever. °Get help right away if: °· You have trouble breathing. °This information is not intended to replace advice given to you by your health care provider. Make sure you discuss any questions you have  with your health care provider. °Document Released: 09/12/2013 Document Revised: 11/04/2017 Document Reviewed: 03/13/2016 °Elsevier Patient Education © 2020 [employer] °Implanted Port Home Guide °An implanted port is a device that is placed under the skin. It is usually placed in the chest. The device can be used to give IV medicine, to take blood, or for dialysis. You may have an implanted port if: °· You need IV medicine that would be irritating to the small veins in your hands or arms. °· You need IV medicines, such as antibiotics, for a long period of time. °· You need IV nutrition for a long period of time. °· You need dialysis. °Having a port means that your health care provider will not need to use the veins in your arms for these procedures. You may have fewer limitations when using a port than you would if you used other types of long-term IVs, and you will likely be able to return to normal activities after your incision heals. °An implanted port has two main parts: °· Reservoir. The reservoir is the part where a needle is inserted to give medicines or draw blood. The reservoir is round. After it is placed, it appears as a small, raised area under your skin. °· Catheter. The catheter is a thin, flexible tube that connects the reservoir to a vein. Medicine that is inserted into the reservoir goes into the catheter and then into the vein. °How is my port accessed? °To access your port: °· A numbing cream may be placed on the skin over the port site. °· Your   health care provider will put on a mask and sterile gloves. °· The skin over your port will be cleaned carefully with a germ-killing soap and allowed to dry. °· Your health care provider will gently pinch the port and insert a needle into it. °· Your health care provider will check for a blood return to make sure the port is in the vein and is not clogged. °· If your port needs to remain accessed to get medicine continuously (constant infusion), your  health care provider will place a clear bandage (dressing) over the needle site. The dressing and needle will need to be changed every week, or as told by your health care provider. °What is flushing? °Flushing helps keep the port from getting clogged. Follow instructions from your health care provider about how and when to flush the port. Ports are usually flushed with saline solution or a medicine called heparin. The need for flushing will depend on how the port is used: °· If the port is only used from time to time to give medicines or draw blood, the port may need to be flushed: °? Before and after medicines have been given. °? Before and after blood has been drawn. °? As part of routine maintenance. Flushing may be recommended every 4-6 weeks. °· If a constant infusion is running, the port may not need to be flushed. °· Throw away any syringes in a disposal container that is meant for sharp items (sharps container). You can buy a sharps container from a pharmacy, or you can make one by using an empty hard plastic bottle with a cover. °How long will my port stay implanted? °The port can stay in for as long as your health care provider thinks it is needed. When it is time for the port to come out, a surgery will be done to remove it. The surgery will be similar to the procedure that was done to put the port in. °Follow these instructions at home: ° °· Flush your port as told by your health care provider. °· If you need an infusion over several days, follow instructions from your health care provider about how to take care of your port site. Make sure you: °? Wash your hands with soap and water before you change your dressing. If soap and water are not available, use alcohol-based hand sanitizer. °? Change your dressing as told by your health care provider. °? Place any used dressings or infusion bags into a plastic bag. Throw that bag in the trash. °? Keep the dressing that covers the needle clean and dry. Do not  get it wet. °? Do not use scissors or sharp objects near the tube. °? Keep the tube clamped, unless it is being used. °· Check your port site every day for signs of infection. Check for: °? Redness, swelling, or pain. °? Fluid or blood. °? Pus or a bad smell. °· Protect the skin around the port site. °? Avoid wearing bra straps that rub or irritate the site. °? Protect the skin around your port from seat belts. Place a soft pad over your chest if needed. °· Bathe or shower as told by your health care provider. The site may get wet as long as you are not actively receiving an infusion. °· Return to your normal activities as told by your health care provider. Ask your health care provider what activities are safe for you. °· Carry a medical alert card or wear a medical alert bracelet at all   times. This will let health care providers know that you have an implanted port in case of an emergency. °Get help right away if: °· You have redness, swelling, or pain at the port site. °· You have fluid or blood coming from your port site. °· You have pus or a bad smell coming from the port site. °· You have a fever. °Summary °· Implanted ports are usually placed in the chest for long-term IV access. °· Follow instructions from your health care provider about flushing the port and changing bandages (dressings). °· Take care of the area around your port by avoiding clothing that puts pressure on the area, and by watching for signs of infection. °· Protect the skin around your port from seat belts. Place a soft pad over your chest if needed. °· Get help right away if you have a fever or you have redness, swelling, pain, drainage, or a bad smell at the port site. °This information is not intended to replace advice given to you by your health care provider. Make sure you discuss any questions you have with your health care provider. °Document Released: 11/22/2005 Document Revised: 03/16/2019 Document Reviewed: 12/25/2016 °Elsevier  Patient Education © 2020 Else[employer] Inc. ° °

## 2019-11-09 NOTE — Op Note (Signed)
      [facility]       Operative Note  Date: 11/09/2019  Preoperative diagnosis:  1. Lung Cancer  Postoperative diagnosis:  Same as above  Procedures: #1. Ultrasound guidance for vascular access to the right internal jugular vein. #2. Fluoroscopic guidance for placement of catheter. #3. Placement of CT compatible Port-A-Cath, right internal jugular vein.  Surgeon: Leotis Pain, MD.   Anesthesia: Local with moderate conscious sedation for approximately 15  minutes using 5 mg of Versed and 100 mcg of Fentanyl  Fluoroscopy time: less than 1 minute  Contrast used: 0  Estimated blood loss: 3 cc  Indication for the procedure:  The patient is a 58 y.o.female with lung cancer.  The patient needs a Port-A-Cath for durable venous access, chemotherapy, lab draws, and CT scans. We are asked to place this. Risks and benefits were discussed and informed consent was obtained.  Description of procedure: The patient was brought to the vascular and interventional radiology suite.  Moderate conscious sedation was administered throughout the procedure during a face to face encounter with the patient with my supervision of the RN administering medicines and monitoring the patient's vital signs, pulse oximetry, telemetry and mental status throughout from the start of the procedure until the patient was taken to the recovery room. The right neck chest and shoulder were sterilely prepped and draped, and a sterile surgical field was created. Ultrasound was used to help visualize a patent right internal jugular vein. This was then accessed under direct ultrasound guidance without difficulty with the Seldinger needle and a permanent image was recorded. A J-wire was placed. After skin nick and dilatation, the peel-away sheath was then placed over the wire. I then anesthetized an area under the clavicle approximately 1-2 fingerbreadths. A transverse incision was created and an inferior pocket was  created with electrocautery and blunt dissection. The port was then brought onto the field, placed into the pocket and secured to the chest wall with 2 Prolene sutures. The catheter was connected to the port and tunneled from the subclavicular incision to the access site. Fluoroscopic guidance was then used to cut the catheter to an appropriate length. The catheter was then placed through the peel-away sheath and the peel-away sheath was removed. The catheter tip was parked in excellent location under fluorocoscopic guidance in the cavoatrial junction. The pocket was then irrigated with antibiotic impregnated saline and the wound was closed with a running 3-0 Vicryl and a 4-0 Monocryl. The access incision was closed with a single 4-0 Monocryl. The Huber needle was used to withdraw blood and flush the port with heparinized saline. Dermabond was then placed as a dressing. The patient tolerated the procedure well and was taken to the recovery room in stable condition.   Leotis Pain 11/09/2019 5:22 PM   This note was created with Dragon Medical transcription system. Any errors in dictation are purely unintentional.

## 2019-11-12 ENCOUNTER — Encounter: Payer: Self-pay | Admitting: Vascular Surgery

## 2019-11-12 ENCOUNTER — Inpatient Hospital Stay: Payer: Medicare Other

## 2019-11-12 ENCOUNTER — Other Ambulatory Visit: Payer: Self-pay

## 2019-11-12 ENCOUNTER — Encounter: Payer: Self-pay | Admitting: *Deleted

## 2019-11-12 ENCOUNTER — Encounter: Payer: Self-pay | Admitting: Oncology

## 2019-11-12 ENCOUNTER — Inpatient Hospital Stay (HOSPITAL_BASED_OUTPATIENT_CLINIC_OR_DEPARTMENT_OTHER): Payer: Medicare Other | Admitting: Hospice and Palliative Medicine

## 2019-11-12 ENCOUNTER — Telehealth: Payer: Self-pay | Admitting: *Deleted

## 2019-11-12 ENCOUNTER — Inpatient Hospital Stay (HOSPITAL_BASED_OUTPATIENT_CLINIC_OR_DEPARTMENT_OTHER): Payer: Medicare Other | Admitting: Oncology

## 2019-11-12 VITALS — BP 137/75 | HR 58

## 2019-11-12 VITALS — BP 133/68 | HR 72 | Temp 96.9°F | Wt 133.0 lb

## 2019-11-12 DIAGNOSIS — Z515 Encounter for palliative care: Secondary | ICD-10-CM

## 2019-11-12 DIAGNOSIS — C3431 Malignant neoplasm of lower lobe, right bronchus or lung: Secondary | ICD-10-CM

## 2019-11-12 DIAGNOSIS — Z7189 Other specified counseling: Secondary | ICD-10-CM | POA: Diagnosis not present

## 2019-11-12 DIAGNOSIS — Z5111 Encounter for antineoplastic chemotherapy: Secondary | ICD-10-CM | POA: Diagnosis not present

## 2019-11-12 LAB — CBC WITH DIFFERENTIAL/PLATELET
Abs Immature Granulocytes: 0.11 10*3/uL — ABNORMAL HIGH (ref 0.00–0.07)
Basophils Absolute: 0 10*3/uL (ref 0.0–0.1)
Basophils Relative: 0 %
Eosinophils Absolute: 0 10*3/uL (ref 0.0–0.5)
Eosinophils Relative: 0 %
HCT: 36.8 % (ref 36.0–46.0)
Hemoglobin: 11.5 g/dL — ABNORMAL LOW (ref 12.0–15.0)
Immature Granulocytes: 1 %
Lymphocytes Relative: 7 %
Lymphs Abs: 0.8 10*3/uL (ref 0.7–4.0)
MCH: 29.2 pg (ref 26.0–34.0)
MCHC: 31.3 g/dL (ref 30.0–36.0)
MCV: 93.4 fL (ref 80.0–100.0)
Monocytes Absolute: 0.7 10*3/uL (ref 0.1–1.0)
Monocytes Relative: 7 %
Neutro Abs: 9.1 10*3/uL — ABNORMAL HIGH (ref 1.7–7.7)
Neutrophils Relative %: 85 %
Platelets: 265 10*3/uL (ref 150–400)
RBC: 3.94 MIL/uL (ref 3.87–5.11)
RDW: 14.9 % (ref 11.5–15.5)
WBC: 10.7 10*3/uL — ABNORMAL HIGH (ref 4.0–10.5)
nRBC: 0 % (ref 0.0–0.2)

## 2019-11-12 LAB — COMPREHENSIVE METABOLIC PANEL
ALT: 34 U/L (ref 0–44)
AST: 17 U/L (ref 15–41)
Albumin: 3.2 g/dL — ABNORMAL LOW (ref 3.5–5.0)
Alkaline Phosphatase: 67 U/L (ref 38–126)
Anion gap: 7 (ref 5–15)
BUN: 21 mg/dL — ABNORMAL HIGH (ref 6–20)
CO2: 29 mmol/L (ref 22–32)
Calcium: 8.7 mg/dL — ABNORMAL LOW (ref 8.9–10.3)
Chloride: 98 mmol/L (ref 98–111)
Creatinine, Ser: <0.3 mg/dL — ABNORMAL LOW (ref 0.44–1.00)
Glucose, Bld: 98 mg/dL (ref 70–99)
Potassium: 4.1 mmol/L (ref 3.5–5.1)
Sodium: 134 mmol/L — ABNORMAL LOW (ref 135–145)
Total Bilirubin: 0.5 mg/dL (ref 0.3–1.2)
Total Protein: 6.8 g/dL (ref 6.5–8.1)

## 2019-11-12 LAB — TSH: TSH: 0.297 u[IU]/mL — ABNORMAL LOW (ref 0.350–4.500)

## 2019-11-12 MED ORDER — SODIUM CHLORIDE 0.9 % IV SOLN
200.0000 mg/m2 | Freq: Once | INTRAVENOUS | Status: AC
  Administered 2019-11-12: 324 mg via INTRAVENOUS
  Filled 2019-11-12: qty 54

## 2019-11-12 MED ORDER — SODIUM CHLORIDE 0.9% FLUSH
10.0000 mL | INTRAVENOUS | Status: DC | PRN
  Filled 2019-11-12: qty 10

## 2019-11-12 MED ORDER — SODIUM CHLORIDE 0.9 % IV SOLN: 750.0000 mg | Freq: Once | INTRAVENOUS | Status: DC

## 2019-11-12 MED ORDER — DIPHENHYDRAMINE HCL 50 MG/ML IJ SOLN
50.0000 mg | Freq: Once | INTRAMUSCULAR | Status: AC
  Administered 2019-11-12: 50 mg via INTRAVENOUS
  Filled 2019-11-12: qty 1

## 2019-11-12 MED ORDER — SODIUM CHLORIDE 0.9 % IV SOLN
200.0000 mg | Freq: Once | INTRAVENOUS | Status: AC
  Administered 2019-11-12: 200 mg via INTRAVENOUS
  Filled 2019-11-12: qty 8

## 2019-11-12 MED ORDER — SODIUM CHLORIDE 0.9 % IV SOLN
500.0000 mg | Freq: Once | INTRAVENOUS | Status: AC
  Administered 2019-11-12: 500 mg via INTRAVENOUS
  Filled 2019-11-12: qty 50

## 2019-11-12 MED ORDER — SODIUM CHLORIDE 0.9 % IV SOLN
Freq: Once | INTRAVENOUS | Status: AC
  Administered 2019-11-12: 10:00:00 via INTRAVENOUS
  Filled 2019-11-12: qty 5

## 2019-11-12 MED ORDER — PALONOSETRON HCL INJECTION 0.25 MG/5ML
0.2500 mg | Freq: Once | INTRAVENOUS | Status: AC
  Administered 2019-11-12: 0.25 mg via INTRAVENOUS
  Filled 2019-11-12: qty 5

## 2019-11-12 MED ORDER — SODIUM CHLORIDE 0.9 % IV SOLN
Freq: Once | INTRAVENOUS | Status: AC
  Administered 2019-11-12: 09:00:00 via INTRAVENOUS
  Filled 2019-11-12: qty 250

## 2019-11-12 MED ORDER — FAMOTIDINE IN NACL 20-0.9 MG/50ML-% IV SOLN
20.0000 mg | Freq: Once | INTRAVENOUS | Status: AC
  Administered 2019-11-12: 20 mg via INTRAVENOUS
  Filled 2019-11-12: qty 50

## 2019-11-12 MED ORDER — HEPARIN SOD (PORK) LOCK FLUSH 100 UNIT/ML IV SOLN
500.0000 [IU] | Freq: Once | INTRAVENOUS | Status: AC | PRN
  Administered 2019-11-12: 500 [IU]
  Filled 2019-11-12: qty 5

## 2019-11-12 NOTE — Progress Notes (Signed)
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  Patient followed by Dr. Janese Banks and Palliative Care.   Met with patient during infusion.  Reports that her appetite has improved and that she is eating better.  Reports that she really likes the ensure and has been drinking about 2 per day.  Reports burning sensation has improved and mouth sores.  Reports that she ate egg, biscuit and 1/2 banana this am.  Last night for dinner ate oatmeal, fruit and yogurt.  Yesterday at lunch ate tuna salad in flour tortilla.  Overall reports that she feels good.    Medications: reviewed  Labs: Na 134  Anthropometrics:   Weight 133 lb today increased from 132 lb on 11/19.    NUTRITION DIAGNOSIS: Inadequate oral intake improving   INTERVENTION:  Another case of ensure enlive giving to patient today.  Encouraged patient to continue eating high calorie, high protein foods Patient has contact information    MONITORING, EVALUATION, GOAL: Patient will consume adequate calories and protein to maintain lean muscle mass   NEXT VISIT: to be determined with treatment  Adalin Vanderploeg B. Zenia Resides, [place], [place] Registered Dietitian [phone] (pager)

## 2019-11-12 NOTE — Progress Notes (Signed)
Patient stated that she has had headaches, nausea, abdominal pain, bilateral feet edema.

## 2019-11-12 NOTE — Patient Instructions (Signed)
1. Start taking 1 tablet Claritin daily to help prevent bone pain from WBC booster injection 2. Continue taking dexamethasone prescription that you received from Dr. Baruch Gouty 3. If nauseated at home, start taking prochlorperazine (compazine) 10mg  tablets. If that does not relieve nausea, then may try ondansetron (zofran) 8mg  tablets.  4. If you have any questions, please do not hesitate to call us.

## 2019-11-12 NOTE — Progress Notes (Signed)
[facility]  [phone] [phone]   Name: Deborah Nguyen Date: 11/12/2019 MRN: [id number]  DOB: [birth year]  Patient Care Team: Tracie Harrier, MD as PCP - General (Internal Medicine) Telford Nab, RN as Registered Nurse    REASON FOR CONSULTATION: Palliative Care consult requested for this 58 y.o. 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  PMH is also notable for muscular dystrophy followed by neuromuscular clinic at [facility], anxiety, history of hypothyroidism, recurrent hyperthermia, and history of lower extremity DVT.  Patient was diagnosed with stage IV lung cancer after having right flank pain over several months.  She ultimately underwent PET/CT on 09/20/2019 with findings of hypermetabolic mass in the right lower lobe, supraclavicular/mediastinal/right hilar lymph nodes, and bilateral adrenal glands.  Patient was also found to have several brain masses on MRI.  Patient is receiving whole brain radiation.  There has been discussion about systemic treatment.  Palliative care was consulted to help address goals and manage ongoing symptoms.  SOCIAL HISTORY:     reports that she quit smoking about 3 years ago. Her smoking use included cigarettes. She smoked 1.00 pack per day. She has never used smokeless tobacco. She reports that she does not drink alcohol or use drugs.   Patient is unmarried.  She lives at home in an apartment with her twin sister as her primary caregiver.  Patient has a son and two grandchildren.  Patient is disabled from her muscular dystrophy but worked in a [employer] earlier in life.  ADVANCE DIRECTIVES:  In process of establishing  CODE STATUS: DNR/DNI (MOST Form completed on 10/25/2019)  PAST MEDICAL HISTORY: Past Medical History:  Diagnosis Date  . Anxiety   . Arthritis   . Chronic kidney disease   . Family history of  breast cancer    aunt  . GERD (gastroesophageal reflux disease)   . History of DVT of lower extremity    Left leg  . Lung cancer ([place])    with brain mets  . Lung mass   . Malignant hyperthermia   . 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 Muscular dystrophy ([place])     PAST SURGICAL HISTORY:  Past Surgical History:  Procedure Laterality Date  . CESAREAN SECTION  11/1985  . COLONOSCOPY N/A 10/27/2015   Procedure: COLONOSCOPY;  Surgeon: Manya Silvas, MD;  Location: [facility] ENDOSCOPY;  Service: Endoscopy;  Laterality: N/A;   NO Propofol - per office  . LITHOTRIPSY    . PORTA CATH INSERTION N/A 11/09/2019   Procedure: PORTA CATH INSERTION;  Surgeon: Algernon Huxley, MD;  Location: [facility] CV LAB;  Service: Cardiovascular;  Laterality: N/A;  . TUBAL LIGATION  03/1986    HEMATOLOGY/ONCOLOGY HISTORY:  Oncology History  Malignant neoplasm of lower lobe of right lung ([place])  09/20/2019 Initial Diagnosis   Malignant neoplasm of lower lobe of right lung ([place])   10/05/2019 Cancer Staging   Staging form: Lung, AJCC 8th Edition - Clinical stage from 10/05/2019: Stage IV (cT2, cN2, cM1) - Signed by Sindy Guadeloupe, MD on 10/08/2019   11/12/2019 -  Chemotherapy   The patient had palonosetron (ALOXI) injection 0.25 mg, 0.25 mg, Intravenous,  Once, 1 of 4 cycles Administration: 0.25 mg (11/12/2019) pegfilgrastim-cbqv (UDENYCA) injection 6 mg, 6 mg, Subcutaneous, Once, 1 of 4 cycles CARBOplatin (PARAPLATIN) 500 mg in sodium chloride 0.9 %  250 mL chemo infusion, 500 mg (100 % of original dose 500 mg), Intravenous,  Once, 1 of 4 cycles Dose modification:   (original dose 487.5 mg, Cycle 1), 500 mg (original dose 500 mg, Cycle 1, Reason: Patient Age, Comment: per md) PACLitaxel (TAXOL) 324 mg in sodium chloride 0.9 % 500 mL chemo infusion (> 80mg /m2), 200 mg/m2 = 324 mg, Intravenous,  Once, 1 of 4 cycles pembrolizumab (KEYTRUDA) 200 mg in sodium chloride 0.9 %  50 mL chemo infusion, 200 mg, Intravenous, Once, 1 of 6 cycles fosaprepitant (EMEND) 150 mg, dexamethasone (DECADRON) 12 mg in sodium chloride 0.9 % 145 mL IVPB, , Intravenous,  Once, 1 of 4 cycles  for chemotherapy treatment.      ALLERGIES:  is allergic to paxil [paroxetine hcl]; ciprofloxacin; levaquin [levofloxacin]; propofol; serzone [nefazodone]; biaxin [clarithromycin]; erythromycin; and penicillins.  MEDICATIONS:  Current Outpatient Medications  Medication Sig Dispense Refill  . acetaminophen (TYLENOL) 325 MG tablet Take 650 mg by mouth every 6 (six) hours as needed.    . ALPRAZolam (XANAX) 0.25 MG tablet Take 0.25 mg by mouth at bedtime as needed for anxiety.    Marland Kitchen apixaban (ELIQUIS) 5 MG TABS tablet Take 1 tablet by mouth 2 (two) times daily.    . citalopram (CELEXA) 20 MG tablet Take 1 tablet (20 mg total) by mouth daily. 30 tablet 2  . dexamethasone (DECADRON) 2 MG tablet Take 2 tablets (4 mg total) by mouth 2 (two) times daily with a meal. First dose to start on 11/10 in the am. 60 tablet 0  . dexamethasone (DECADRON) 4 MG tablet Take 2 tablets (8 mg total) by mouth daily. Start the day after carboplatin chemotherapy for 3 days. 30 tablet 1  . fluticasone (FLONASE) 50 MCG/ACT nasal spray Place 2 sprays into both nostrils daily as needed.     . lidocaine-prilocaine (EMLA) cream Apply to affected area once 30 g 3  . magic mouthwash SOLN Take 5 mLs by mouth 3 (three) times daily as needed for mouth pain. 300 mL 1  . methimazole (TAPAZOLE) 5 MG tablet Take 5 mg by mouth daily.    . metoprolol succinate (TOPROL-XL) 25 MG 24 hr tablet Take 12.5 tablets by mouth daily.    Marland Kitchen nystatin (MYCOSTATIN) 100000 UNIT/ML suspension Take 5 mLs (500,000 Units total) by mouth 4 (four) times daily. Swish and spit. 300 mL 0  . ondansetron (ZOFRAN) 8 MG tablet Take 1 tablet (8 mg total) by mouth 2 (two) times daily as needed for refractory nausea / vomiting. Start on day 3 after carboplatin chemo. 30  tablet 1  . ondansetron (ZOFRAN-ODT) 4 MG disintegrating tablet Take 1 tablet (4 mg total) by mouth every 8 (eight) hours as needed for nausea or vomiting. 30 tablet 2  . Oxycodone HCl 10 MG TABS Take 1 tablet (10 mg total) by mouth every 4 (four) hours as needed. 60 tablet 0  . pantoprazole (PROTONIX) 20 MG tablet Take 1 tablet (20 mg total) by mouth 2 (two) times daily. 120 tablet 0  . prochlorperazine (COMPAZINE) 10 MG tablet Take 0.5-1 tablets (5-10 mg total) by mouth every 6 (six) hours as needed for nausea or vomiting. 30 tablet 2  . prochlorperazine (COMPAZINE) 10 MG tablet Take 1 tablet (10 mg total) by mouth every 6 (six) hours as needed (Nausea or vomiting). 30 tablet 1  . sucralfate (CARAFATE) 1 g tablet Take 1 tablet (1 g total) by mouth 3 (three) times daily. Dissolve in 3-4 tbsp warm  water, swish and swallow. 90 tablet 3  . vitamin B-12 (CYANOCOBALAMIN) 1000 MCG tablet Take 1 tablet (1,000 mcg total) by mouth daily. 30 tablet 1   No current facility-administered medications for this visit.    Facility-Administered Medications Ordered in Other Visits  Medication Dose Route Frequency Provider Last Rate Last Dose  . CARBOplatin (PARAPLATIN) 500 mg in sodium chloride 0.9 % 250 mL chemo infusion  500 mg Intravenous Once Sindy Guadeloupe, MD      . heparin lock flush 100 unit/mL  500 Units Intracatheter Once PRN Sindy Guadeloupe, MD      . PACLitaxel (TAXOL) 324 mg in sodium chloride 0.9 % 500 mL chemo infusion (> 80mg /m2)  200 mg/m2 (Treatment Plan Recorded) Intravenous Once Sindy Guadeloupe, MD 185 mL/hr at 11/12/19 1127 324 mg at 11/12/19 1127  . sodium chloride flush (NS) 0.9 % injection 10 mL  10 mL Intracatheter PRN Sindy Guadeloupe, MD        VITAL SIGNS: LMP 12/15/2010 (Approximate)  There were no vitals filed for this visit.  Estimated body mass index is 25.13 kg/m as calculated from the following:   Height as of 11/09/19: 5\' 1"  (1.549 m).   Weight as of an earlier encounter on  11/12/19: 133 lb (60.3 kg).  LABS: CBC:    Component Value Date/Time   WBC 10.7 (H) 11/12/2019 0818   HGB 11.5 (L) 11/12/2019 0818   HGB 13.5 11/07/2013 2128   HCT 36.8 11/12/2019 0818   HCT 39.2 11/07/2013 2128   PLT 265 11/12/2019 0818   PLT 282 11/07/2013 2128   MCV 93.4 11/12/2019 0818   MCV 96 11/07/2013 2128   NEUTROABS 9.1 (H) 11/12/2019 0818   NEUTROABS 6.8 (H) 05/24/2013 1340   LYMPHSABS 0.8 11/12/2019 0818   LYMPHSABS 1.6 05/24/2013 1340   MONOABS 0.7 11/12/2019 0818   MONOABS 0.6 05/24/2013 1340   EOSABS 0.0 11/12/2019 0818   EOSABS 0.0 05/24/2013 1340   BASOSABS 0.0 11/12/2019 0818   BASOSABS 0.0 05/24/2013 1340   Comprehensive Metabolic Panel:    Component Value Date/Time   NA 134 (L) 11/12/2019 0818   NA 140 11/07/2013 2128   K 4.1 11/12/2019 0818   K 4.2 11/07/2013 2128   CL 98 11/12/2019 0818   CL 107 11/07/2013 2128   CO2 29 11/12/2019 0818   CO2 29 11/07/2013 2128   BUN 21 (H) 11/12/2019 0818   BUN 21 (H) 11/07/2013 2128   CREATININE <0.30 (L) 11/12/2019 0818   CREATININE 0.50 (L) 11/07/2013 2128   GLUCOSE 98 11/12/2019 0818   GLUCOSE 107 (H) 11/07/2013 2128   CALCIUM 8.7 (L) 11/12/2019 0818   CALCIUM 9.3 11/07/2013 2128   AST 17 11/12/2019 0818   AST 48 (H) 11/07/2013 2128   ALT 34 11/12/2019 0818   ALT 60 11/07/2013 2128   ALKPHOS 67 11/12/2019 0818   ALKPHOS 92 11/07/2013 2128   BILITOT 0.5 11/12/2019 0818   BILITOT 0.2 11/07/2013 2128   PROT 6.8 11/12/2019 0818   PROT 7.5 11/07/2013 2128   ALBUMIN 3.2 (L) 11/12/2019 0818   ALBUMIN 3.6 11/07/2013 2128    RADIOGRAPHIC STUDIES: US Venous Img Lower Unilateral Left  Result Date: 11/08/2019 CLINICAL DATA:  58 year old female with left lower extremity swelling for 1 week EXAM: LEFT LOWER EXTREMITY VENOUS DOPPLER ULTRASOUND TECHNIQUE: Gray-scale sonography with graded compression, as well as color Doppler and duplex ultrasound were performed to evaluate the lower extremity deep venous systems  from the level  of the common femoral vein and including the common femoral, femoral, profunda femoral, popliteal and calf veins including the posterior tibial, peroneal and gastrocnemius veins when visible. The superficial great saphenous vein was also interrogated. Spectral Doppler was utilized to evaluate flow at rest and with distal augmentation maneuvers in the common femoral, femoral and popliteal veins. COMPARISON:  None. FINDINGS: Contralateral Common Femoral Vein: Respiratory phasicity is normal and symmetric with the symptomatic side. No evidence of thrombus. Normal compressibility. Common Femoral Vein: No evidence of thrombus. Normal compressibility, respiratory phasicity and response to augmentation. Saphenofemoral Junction: No evidence of thrombus. Normal compressibility and flow on color Doppler imaging. Profunda Femoral Vein: No evidence of thrombus. Normal compressibility and flow on color Doppler imaging. Femoral Vein: No evidence of thrombus. Normal compressibility, respiratory phasicity and response to augmentation. Popliteal Vein: No evidence of thrombus. Normal compressibility, respiratory phasicity and response to augmentation. Calf Veins: No evidence of thrombus. Normal compressibility and flow on color Doppler imaging. Superficial Great Saphenous Vein: No evidence of thrombus. Normal compressibility. Venous Reflux:  None. Other Findings:  None. IMPRESSION: No evidence of deep venous thrombosis. Electronically Signed   By: Jacqulynn Cadet M.D.   On: 11/08/2019 13:54    PERFORMANCE STATUS (ECOG) : 2 - Symptomatic, <50% confined to bed  Review of Systems Unless otherwise noted, a complete review of systems is negative.  Physical Exam General: NAD, frail appearing, thin Pulmonary: unlabored Skin: no rashes Neurological: Weakness but otherwise nonfocal  IMPRESSION: Routine follow-up visit made today.  Patient was seen in the infusion area.  Patient reports that she is doing  reasonably well.  She denies any distressing symptoms today.  She feels like she is tolerating treatment well.  She reports her appetite has improved.  Weight appears stable at 133 pounds.  Patient is being followed by home health PT/OT.  Patient reports that it is going well and she feels it is helped with her weakness.  She has received the order DME including shower chair and a walker.  However, patient is mostly using her cane.  We discussed patient's emotional coping with the cancer.  She seems to have a brighter affect and is optimistic regarding her short-term future.  She is most focused on her family with a goal of spending time with her grandchildren.   PLAN: -Continue current scope of treatment -Continue oxycodone 10 mg every 4 hours PRN -Continue citalopram to 20 mg daily -RTC in 2-3 weeks   Patient expressed understanding and was in agreement with this plan. She also understands that She can call the clinic at any time with any questions, concerns, or complaints.     Time Total: 15 minutes  Visit consisted of counseling and education dealing with the complex and emotionally intense issues of symptom management and palliative care in the setting of serious and potentially life-threatening illness.Greater than 50%  of this time was spent counseling and coordinating care related to the above assessment and plan.  Signed by: Altha Harm, PhD, NP-C

## 2019-11-12 NOTE — Telephone Encounter (Signed)
Per Dr. Westley Foots at [facility], pt can proceed with chemo+immunotherapy as planned. 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

## 2019-11-12 NOTE — Progress Notes (Signed)
  Oncology Nurse Navigator Documentation  Navigator Location: [facility]Med Onc (11/12/19 1500)   )Navigator Encounter Type: Treatment (11/12/19 1500)                   Treatment Initiated Date: 10/18/19 (11/12/19 1500) Patient Visit Type: MedOnc (11/12/19 1500) Treatment Phase: First Chemo Tx (11/12/19 1500) Barriers/Navigation Needs: Anxiety (11/12/19 1500)   Interventions: Psycho-Social Support (11/12/19 1500)       met with patient and her sister before first chemo treatment today. All questions answered during visit. Reassurance provided. Reviewed upcoming appts. Nothing further needed at this time. Instructed to call with any further questions or needs. Pt verbalized understanding.               Time Spent with Patient: 45 (11/12/19 1500)

## 2019-11-13 ENCOUNTER — Other Ambulatory Visit: Payer: Self-pay | Admitting: *Deleted

## 2019-11-13 ENCOUNTER — Telehealth: Payer: Self-pay | Admitting: *Deleted

## 2019-11-13 ENCOUNTER — Telehealth: Payer: Self-pay

## 2019-11-13 ENCOUNTER — Inpatient Hospital Stay: Payer: Medicare Other

## 2019-11-13 ENCOUNTER — Other Ambulatory Visit: Payer: Self-pay

## 2019-11-13 DIAGNOSIS — R7989 Other specified abnormal findings of blood chemistry: Secondary | ICD-10-CM

## 2019-11-13 DIAGNOSIS — Z5111 Encounter for antineoplastic chemotherapy: Secondary | ICD-10-CM | POA: Diagnosis not present

## 2019-11-13 DIAGNOSIS — C3431 Malignant neoplasm of lower lobe, right bronchus or lung: Secondary | ICD-10-CM

## 2019-11-13 LAB — T4, FREE: Free T4: 0.96 ng/dL (ref 0.61–1.12)

## 2019-11-13 LAB — T4: T4, Total: 6.4 ug/dL (ref 4.5–12.0)

## 2019-11-13 MED ORDER — PEGFILGRASTIM-CBQV 6 MG/0.6ML ~~LOC~~ SOSY
6.0000 mg | PREFILLED_SYRINGE | Freq: Once | SUBCUTANEOUS | Status: AC
  Administered 2019-11-13: 16:00:00 6 mg via SUBCUTANEOUS
  Filled 2019-11-13: qty 0.6

## 2019-11-13 NOTE — Progress Notes (Signed)
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 She presented to Dr. Ginette Pitman with symptoms of right flank pain which was radiating to her front which prompted a CT abdomen. CT abdomen pelvis with contrast showed a 3.7 x 2.8 x 5 cm right adrenal mass. 1.7 x 1.2 x 2.1 cm left adrenal nodule 1.8 cm right renal simple cyst. And a new 3.6 x 3.2 cm spiculated mass in the right lower lobe with tethering of the adjacent pleura. Prominent hilar lymph nodes. This was followed by a PET CT scan on 09/20/2019 which showed a 3.6 cm right lower lobe mass which was markedly hypermetabolic along with hypermetabolic metastatic disease in the right supraclavicular space as well as mediastinum and right hilum and hypermetabolic metastatic disease in bilateral adrenal glands. She will also noted to have bilateral hypermetabolic thyroid nodules and a possible nodule in the pelvis adjacent to the sigmoid colon.  Patient currently reports ongoing pain in her right flank which is relatively well controlled with hydrocodone. She also has occasional nausea for which she has as needed nausea medications. She is here with her twin sister today and is highly anxious Of note patient has a history of central core myopathy and follows up with neuromuscular clinic at [facility]. Patient states that her twin sister has had episodes of hyperthermia but patient herself has not had  these episodes. I did review [facility] neuromuscular clinic note and the hyperthermia has been attributed to her history of hyperthyroidism and thyroid dysfunction rather than malignant hyperthermia. However given her history of myopathy patient does carry risk of malignant hyperthermia.  Not enough tissue was available for NGS testing.  Patient therefore underwentLiquid biopsy which showed evidence of PI K3 CA amplification, RIC POR amplification, T p53  Interval history-she still has issues with her balance presently and working with physical therapy.  Mouth sores are getting better.  Appetite is improving.  Overall she is in good spirits ready to start treatment.  Bowel movements are regular.  She is currently on Xarelto for her prior history of DVT.  Left leg pain is slowly improving  ECOG PS- 1 Pain scale- 0 Opioid associated constipation- no  Review of systems- Review of Systems  Constitutional: Positive for malaise/fatigue. Negative for chills, fever and weight loss.  HENT: Negative for congestion, ear discharge and nosebleeds.   Eyes: Negative for blurred vision.  Respiratory: Negative for cough, hemoptysis, sputum production, shortness of breath and wheezing.   Cardiovascular: Negative for chest pain, palpitations, orthopnea and claudication.  Gastrointestinal: Negative for abdominal pain, blood in stool, constipation, diarrhea, heartburn, melena, nausea and vomiting.  Genitourinary: Negative for dysuria, flank pain, frequency, hematuria and urgency.  Musculoskeletal: Negative for back pain, joint pain and myalgias.  Skin: Negative for rash.  Neurological: Negative for dizziness, tingling, focal weakness, seizures, weakness and headaches.       Gait imbalance  Endo/Heme/Allergies: Does not bruise/bleed easily.  Psychiatric/Behavioral: Negative for depression and suicidal ideas. The patient  does not have insomnia.      Allergies  Allergen Reactions   Paxil [Paroxetine Hcl] Other (See  Comments)    Hallucinations    Ciprofloxacin Other (See Comments)    Unknown   Levaquin [Levofloxacin] Other (See Comments)    Unknown    Propofol Other (See Comments)    She has central core disease which is a form of muscular dystrophy. She has an increase risk of malignant hyperthermia with anesthesia. She should not receive Propofol.    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   Muscular dystrophy [facility])      Past Surgical History:  Procedure Laterality Date   CESAREAN SECTION  11/1985   COLONOSCOPY N/A 10/27/2015   Procedure: COLONOSCOPY;  Surgeon: Manya Silvas, MD;  Location: [place];  Service: Endoscopy;  Laterality: N/A;   NO Propofol - per office   LITHOTRIPSY     PORTA CATH INSERTION N/A 11/09/2019   Procedure: PORTA CATH INSERTION;  Surgeon: Algernon Huxley, MD;  Location: [place] [facility];  Service: Cardiovascular;  Laterality: N/A;   TUBAL LIGATION  03/1986    Social History   Socioeconomic History   Marital status: Divorced    Spouse name: Not on file   Number of children: Not on file   Years of education: Not on file   Highest education level: Not on file  Occupational History   Not on file  Social Needs   Financial resource strain: Not on file   Food insecurity    Worry: Not on file    Inability: Not on file   Transportation needs    Medical: Not on file    Non-medical: Not on file  Tobacco Use   Smoking status: Former  Smoker    Packs/day: 1.00    Types: Cigarettes    Quit date: 05/2016    Years since quitting: 3.5   Smokeless tobacco: Never Used  Substance and Sexual Activity   Alcohol use: No   Drug use: No   Sexual activity: Not on file  Lifestyle   Physical activity    Days per week: Not on file    Minutes per session: Not on file   Stress: Not on file  Relationships   Social connections    Talks on phone: Not on file    Gets together: Not on file    Attends religious service: Not on file    Active member of club or organization: Not on file    Attends meetings of clubs or organizations: Not on file    Relationship status: Not on file   Intimate partner violence    Fear of current or ex partner: Not on file    Emotionally abused: Not on file    Physically abused: Not on file    Forced sexual activity: Not on  file  Other Topics Concern   Not on file  Social History Narrative   Not on file    Family History  Problem Relation Age of Onset   Cancer Father        bladder, lung   Breast cancer Maternal Aunt 45     Current Outpatient Medications:    acetaminophen (TYLENOL) 325 MG tablet, Take 650 mg by mouth every 6 (six) hours as needed., Disp: , Rfl:    ALPRAZolam (XANAX) 0.25 MG tablet, Take 0.25 mg by mouth at bedtime as needed for anxiety., Disp: , Rfl:    apixaban (ELIQUIS) 5 MG TABS tablet, Take 1 tablet by mouth 2 (two) times daily., Disp: , Rfl:    citalopram (CELEXA) 20 MG tablet, Take 1 tablet (20 mg total) by mouth daily., Disp: 30 tablet, Rfl: 2   dexamethasone (DECADRON) 2 MG tablet, Take 2 tablets (4 mg total) by mouth 2 (two) times daily with a meal. First dose to start on 11/10 in the am., Disp: 60 tablet, Rfl: 0   dexamethasone (DECADRON) 4 MG tablet, Take 2 tablets (8 mg total) by mouth daily. Start the day after carboplatin chemotherapy for 3 days., Disp: 30 tablet, Rfl: 1   fluticasone (FLONASE) 50 MCG/ACT nasal spray, Place 2 sprays into both  nostrils daily as needed. , Disp: , Rfl:    lidocaine-prilocaine (EMLA) cream, Apply to affected area once, Disp: 30 g, Rfl: 3   magic mouthwash SOLN, Take 5 mLs by mouth 3 (three) times daily as needed for mouth pain., Disp: 300 mL, Rfl: 1   methimazole (TAPAZOLE) 5 MG tablet, Take 5 mg by mouth daily., Disp: , Rfl:    metoprolol succinate (TOPROL-XL) 25 MG 24 hr tablet, Take 12.5 tablets by mouth daily., Disp: , Rfl:    nystatin (MYCOSTATIN) 100000 UNIT/ML suspension, Take 5 mLs (500,000 Units total) by mouth 4 (four) times daily. Swish and spit., Disp: 300 mL, Rfl: 0   ondansetron (ZOFRAN) 8 MG tablet, Take 1 tablet (8 mg total) by mouth 2 (two) times daily as needed for refractory nausea / vomiting. Start on day 3 after carboplatin chemo., Disp: 30 tablet, Rfl: 1   ondansetron (ZOFRAN-ODT) 4 MG disintegrating tablet, Take 1 tablet (4 mg total) by mouth every 8 (eight) hours as needed for nausea or vomiting., Disp: 30 tablet, Rfl: 2   Oxycodone HCl 10 MG TABS, Take 1 tablet (10 mg total) by mouth every 4 (four) hours as needed., Disp: 60 tablet, Rfl: 0   pantoprazole (PROTONIX) 20 MG tablet, Take 1 tablet (20 mg total) by mouth 2 (two) times daily., Disp: 120 tablet, Rfl: 0   prochlorperazine (COMPAZINE) 10 MG tablet, Take 0.5-1 tablets (5-10 mg total) by mouth every 6 (six) hours as needed for nausea or vomiting., Disp: 30 tablet, Rfl: 2   prochlorperazine (COMPAZINE) 10 MG tablet, Take 1 tablet (10 mg total) by mouth every 6 (six) hours as needed (Nausea or vomiting)., Disp: 30 tablet, Rfl: 1   sucralfate (CARAFATE) 1 g tablet, Take 1 tablet (1 g total) by mouth 3 (three) times daily. Dissolve in 3-4 tbsp warm water, swish and swallow., Disp: 90 tablet, Rfl: 3   vitamin B-12 (CYANOCOBALAMIN) 1000 MCG tablet, Take 1 tablet (1,000 mcg total) by mouth daily., Disp: 30 tablet, Rfl: 1  Physical exam:  Vitals:   11/12/19 0846  BP: 133/68  Pulse: 72  Temp: (!) 96.9 F (36.1 C)    TempSrc: Tympanic  Weight: 133 lb (  60.3 kg)   Physical Exam Constitutional:      Comments: Sitting in a wheelchair.  Appears in no acute distress  HENT:     Head: Normocephalic and atraumatic.     Mouth/Throat:     Mouth: Mucous membranes are moist.     Pharynx: Oropharynx is clear.  Eyes:     Pupils: Pupils are equal, round, and reactive to light.  Neck:     Musculoskeletal: Normal range of motion.  Cardiovascular:     Rate and Rhythm: Normal rate and regular rhythm.     Heart sounds: Normal heart sounds.  Pulmonary:     Effort: Pulmonary effort is normal.     Breath sounds: Normal breath sounds.  Abdominal:     General: Bowel sounds are normal.     Palpations: Abdomen is soft.  Skin:    General: Skin is warm and dry.  Neurological:     Mental Status: She is alert and oriented to person, place, and time.      CMP Latest Ref Rng & Units 11/12/2019  Glucose 70 - 99 mg/dL 98  BUN 6 - 20 mg/dL 21(H)  Creatinine 0.44 - 1.00 mg/dL <0.30(L)  Sodium 135 - 145 mmol/L 134(L)  Potassium 3.5 - 5.1 mmol/L 4.1  Chloride 98 - 111 mmol/L 98  CO2 22 - 32 mmol/L 29  Calcium 8.9 - 10.3 mg/dL 8.7(L)  Total Protein 6.5 - 8.1 g/dL 6.8  Total Bilirubin 0.3 - 1.2 mg/dL 0.5  Alkaline Phos 38 - 126 U/L 67  AST 15 - 41 U/L 17  ALT 0 - 44 U/L 34   CBC Latest Ref Rng & Units 11/12/2019  WBC 4.0 - 10.5 K/uL 10.7(H)  Hemoglobin 12.0 - 15.0 g/dL 11.5(L)  Hematocrit 36.0 - 46.0 % 36.8  Platelets 150 - 400 K/uL 265    No images are attached to the encounter.  US Venous Img Lower Unilateral Left  Result Date: 11/08/2019 CLINICAL DATA:  58 year old female with left lower extremity swelling for 1 week EXAM: LEFT LOWER EXTREMITY VENOUS DOPPLER ULTRASOUND TECHNIQUE: Gray-scale sonography with graded compression, as well as color Doppler and duplex ultrasound were performed to evaluate the lower extremity deep venous systems from the level of the common femoral vein and including the common  femoral, femoral, profunda femoral, popliteal and calf veins including the posterior tibial, peroneal and gastrocnemius veins when visible. The superficial great saphenous vein was also interrogated. Spectral Doppler was utilized to evaluate flow at rest and with distal augmentation maneuvers in the common femoral, femoral and popliteal veins. COMPARISON:  None. FINDINGS: Contralateral Common Femoral Vein: Respiratory phasicity is normal and symmetric with the symptomatic side. No evidence of thrombus. Normal compressibility. Common Femoral Vein: No evidence of thrombus. Normal compressibility, respiratory phasicity and response to augmentation. Saphenofemoral Junction: No evidence of thrombus. Normal compressibility and flow on color Doppler imaging. Profunda Femoral Vein: No evidence of thrombus. Normal compressibility and flow on color Doppler imaging. Femoral Vein: No evidence of thrombus. Normal compressibility, respiratory phasicity and response to augmentation. Popliteal Vein: No evidence of thrombus. Normal compressibility, respiratory phasicity and response to augmentation. Calf Veins: No evidence of thrombus. Normal compressibility and flow on color Doppler imaging. Superficial Great Saphenous Vein: No evidence of thrombus. Normal compressibility. Venous Reflux:  None. Other Findings:  None. IMPRESSION: No evidence of deep venous thrombosis. Electronically Signed   By: Jacqulynn Cadet M.D.   On: 11/08/2019 13:54     Assessment and plan- Patient is a  58 y.o. female withsquamous cell carcinoma of the lung stage IV [id number] with adrenal and brain metastases.Patient is here for on treatment assessment prior to cycle 1 of carbotaxol Keytruda  Counts okay to proceed with cycle 1 of carbotaxol Keytruda today.  Again discussed risks and benefits of chemotherapy including all but not limited to nausea, vomiting, low blood counts, risk of infections and hospitalization.  Risk of autoimmune side effects  associated with Keytruda.  Treatment is being given with a palliative intent.  Patient understands and agrees to proceed as planned.  She will also be receiving Udenyca on day 2.  Discussed risks and benefits of Udenyca including all but not limited to bone pain, fatigue, arthralgias.  Discussed the need to take Tylenol and Claritin if she has Neulasta associated pain.  Patient verbalized understanding  History of prior DVT: Continue Xarelto  I will see her back in 10 days to see how she tolerated her chemotherapy and for possible fluids  Gait imbalance: Possibly secondary to brain metastases and ongoing whole brain radiation treatment.  Continue to monitor and she currently undergoing home physical therapy.  Patient is on a steroid taper by Dr. Donella Stade which she will be completing this week  Neoplasm related pain: Continue as needed oxycodone   Visit Diagnosis 1. Malignant neoplasm of lower lobe of right lung ([place])   2. Goals of care, counseling/discussion      Dr. Randa Evens, MD, MPH [facility] [phone] 11/13/2019 12:18 PM

## 2019-11-13 NOTE — Telephone Encounter (Signed)
Telephone call to patient for follow up after first chemo she received yesterday.  Patient states did really "great" during the infusion.  States eating and drinking plenty of fluids.  Patient states is having some swelling in feet.  Encouraged patient to elevate feet above level of heart.  Also encouraged patient if she develops any shortness of breath to call 911.   Patient denies any SOB or difficulty breathing.  Encouraged patient to call for any questions or concerns.

## 2019-11-13 NOTE — Telephone Encounter (Signed)
Pt started to have bilateral lower extremity swellling since receiving chemo yesterday. Pt has been advised to keep legs elevated during the day if possible. Any other recommendations for patient?

## 2019-11-14 NOTE — Telephone Encounter (Signed)
I would not do anything else for now.

## 2019-11-14 NOTE — Telephone Encounter (Signed)
Pt's sister made aware to continue to monitor leg swelling at this time and to call back if pt develops new or worsening symptoms.   Pt's sister stated that pt's mouth is starting to feel sore again. States she has only been taking carafate at this time. Encouraged pt's sister to try nystatin again and if it does not help to call me back in a few days. Pt's sister verbalized understanding.

## 2019-11-15 ENCOUNTER — Other Ambulatory Visit: Payer: Self-pay | Admitting: *Deleted

## 2019-11-15 ENCOUNTER — Telehealth: Payer: Self-pay | Admitting: *Deleted

## 2019-11-15 MED ORDER — NALOXEGOL OXALATE 25 MG PO TABS: 25.0000 mg | ORAL_TABLET | Freq: Every day | ORAL | 2 refills | Status: DC

## 2019-11-15 NOTE — Telephone Encounter (Signed)
Daughter called reporting that patient is having pain and nausea and she is asking for something to help patient.

## 2019-11-15 NOTE — Telephone Encounter (Signed)
Pt called and said she was nauseated and having pain. She took her last pain pill about 1 hour ago, she took last nausea med 8 am. She says  That her mouth hurts because of the sores. She says that is where the pain is. She is drinking 6-8 glasses of fluids. She does not eat as much because of her mouth. She states that they have rx to pick up at [facility] in [place] to try to help with pain in mouth and they will pick it up tom. I told pt. She can take pain med around the clock -her sister gives it every 6 hours. I told them to try it every 5 hours. Also she can take the zofran three times a day. The day of chemo and day after she gets a IV version of Zofran and therefore if she is nauseated on those days she will need to take prochlorperazine. Then when I called them back pt. States she is having issues with constipation and she takes 1 senna S every morning and night. Then miralax every morning and evening and today had one small amt. Of stool. I called rao and she said to start movantik. I have sent it in and it may need PA and I called the pt. Back to tell her we are working on medication to help and will give her an update when we hear from pharmacy

## 2019-11-16 ENCOUNTER — Other Ambulatory Visit: Payer: Self-pay

## 2019-11-16 ENCOUNTER — Telehealth: Payer: Self-pay | Admitting: *Deleted

## 2019-11-16 MED ORDER — LUBIPROSTONE 24 MCG PO CAPS: 24.0000 ug | ORAL_CAPSULE | Freq: Two times a day (BID) | ORAL | 0 refills | Status: DC

## 2019-11-16 MED ORDER — DEXAMETHASONE 2 MG PO TABS: ORAL_TABLET | ORAL | 0 refills | Status: DC

## 2019-11-16 NOTE — Telephone Encounter (Signed)
Called pt and got her voicemail and left her a message that we were wanting to check on her and see if she is feeling better or worse and may need to come in for fluids or anything else. I asked her to call me to set up appt if she needs asst.

## 2019-11-16 NOTE — Telephone Encounter (Signed)
Spoke with pt's sister this morning and she states that pt continues to feel nauseated this morning but slightly better. She does not want to come in for IVF at this time. States that they will continue with zofran TID and compazine q6 hours for nausea.   Pt's sister also questioned how pt should be taking steroid taper prescribed by Dr. Baruch Gouty and advised that pt needs to take 1 tablet (2mg ) daily for 5 days then 1 tablet (2mg ) every other day until complete. Pt's sister verbalized understanding.

## 2019-11-16 NOTE — Addendum Note (Signed)
Addended by: Telford Nab on: 11/16/2019 09:31 AM   Modules accepted: Orders

## 2019-11-19 ENCOUNTER — Telehealth: Payer: Self-pay

## 2019-11-19 NOTE — Telephone Encounter (Signed)
Called pharmacy to let them know that she needed her Amitiza filled. Pharmacist stated that they would deliver it to the patient today.

## 2019-11-21 ENCOUNTER — Other Ambulatory Visit: Payer: Self-pay

## 2019-11-21 ENCOUNTER — Other Ambulatory Visit: Payer: Self-pay | Admitting: *Deleted

## 2019-11-21 ENCOUNTER — Encounter: Payer: Self-pay | Admitting: Oncology

## 2019-11-21 MED ORDER — OXYCODONE HCL 10 MG PO TABS: 10.0000 mg | ORAL_TABLET | ORAL | 0 refills | Status: DC | PRN

## 2019-11-21 NOTE — Progress Notes (Signed)
Patient pre screened for office appointment, no questions or concerns today. Patient reminded of upcoming appointment time and date. 

## 2019-11-22 ENCOUNTER — Inpatient Hospital Stay: Payer: Medicare Other | Admitting: Hospice and Palliative Medicine

## 2019-11-22 ENCOUNTER — Inpatient Hospital Stay: Payer: Medicare Other

## 2019-11-22 ENCOUNTER — Other Ambulatory Visit: Payer: Self-pay

## 2019-11-22 ENCOUNTER — Inpatient Hospital Stay (HOSPITAL_BASED_OUTPATIENT_CLINIC_OR_DEPARTMENT_OTHER): Payer: Medicare Other | Admitting: Oncology

## 2019-11-22 VITALS — BP 122/65 | HR 88 | Temp 99.0°F | Wt 135.0 lb

## 2019-11-22 DIAGNOSIS — C7972 Secondary malignant neoplasm of left adrenal gland: Secondary | ICD-10-CM | POA: Diagnosis not present

## 2019-11-22 DIAGNOSIS — C3431 Malignant neoplasm of lower lobe, right bronchus or lung: Secondary | ICD-10-CM

## 2019-11-22 DIAGNOSIS — C7971 Secondary malignant neoplasm of right adrenal gland: Secondary | ICD-10-CM

## 2019-11-22 DIAGNOSIS — C7931 Secondary malignant neoplasm of brain: Secondary | ICD-10-CM

## 2019-11-22 DIAGNOSIS — L270 Generalized skin eruption due to drugs and medicaments taken internally: Secondary | ICD-10-CM

## 2019-11-22 DIAGNOSIS — Z5111 Encounter for antineoplastic chemotherapy: Secondary | ICD-10-CM | POA: Diagnosis not present

## 2019-11-22 LAB — CBC WITH DIFFERENTIAL/PLATELET
Abs Immature Granulocytes: 0.57 10*3/uL — ABNORMAL HIGH (ref 0.00–0.07)
Basophils Absolute: 0 10*3/uL (ref 0.0–0.1)
Basophils Relative: 0 %
Eosinophils Absolute: 0 10*3/uL (ref 0.0–0.5)
Eosinophils Relative: 0 %
HCT: 32.6 % — ABNORMAL LOW (ref 36.0–46.0)
Hemoglobin: 10 g/dL — ABNORMAL LOW (ref 12.0–15.0)
Immature Granulocytes: 7 %
Lymphocytes Relative: 9 %
Lymphs Abs: 0.7 10*3/uL (ref 0.7–4.0)
MCH: 29 pg (ref 26.0–34.0)
MCHC: 30.7 g/dL (ref 30.0–36.0)
MCV: 94.5 fL (ref 80.0–100.0)
Monocytes Absolute: 0.7 10*3/uL (ref 0.1–1.0)
Monocytes Relative: 8 %
Neutro Abs: 6.2 10*3/uL (ref 1.7–7.7)
Neutrophils Relative %: 76 %
Platelets: 201 10*3/uL (ref 150–400)
RBC: 3.45 MIL/uL — ABNORMAL LOW (ref 3.87–5.11)
RDW: 15.5 % (ref 11.5–15.5)
Smear Review: NORMAL
WBC: 8.2 10*3/uL (ref 4.0–10.5)
nRBC: 0 % (ref 0.0–0.2)

## 2019-11-22 LAB — COMPREHENSIVE METABOLIC PANEL
ALT: 32 U/L (ref 0–44)
AST: 20 U/L (ref 15–41)
Albumin: 2.9 g/dL — ABNORMAL LOW (ref 3.5–5.0)
Alkaline Phosphatase: 94 U/L (ref 38–126)
Anion gap: 9 (ref 5–15)
BUN: 11 mg/dL (ref 6–20)
CO2: 29 mmol/L (ref 22–32)
Calcium: 8.7 mg/dL — ABNORMAL LOW (ref 8.9–10.3)
Chloride: 98 mmol/L (ref 98–111)
Creatinine, Ser: 0.35 mg/dL — ABNORMAL LOW (ref 0.44–1.00)
GFR calc Af Amer: >60 mL/min (ref >60)
GFR calc non Af Amer: >60 mL/min (ref >60)
Glucose, Bld: 101 mg/dL — ABNORMAL HIGH (ref 70–99)
Potassium: 3.9 mmol/L (ref 3.5–5.1)
Sodium: 136 mmol/L (ref 135–145)
Total Bilirubin: 0.3 mg/dL (ref 0.3–1.2)
Total Protein: 6.2 g/dL — ABNORMAL LOW (ref 6.5–8.1)

## 2019-11-22 LAB — TSH: TSH: 0.875 u[IU]/mL (ref 0.350–4.500)

## 2019-11-22 MED ORDER — SODIUM CHLORIDE 0.9% FLUSH
10.0000 mL | Freq: Once | INTRAVENOUS | Status: AC
  Administered 2019-11-22: 10 mL via INTRAVENOUS
  Filled 2019-11-22: qty 10

## 2019-11-22 MED ORDER — HEPARIN SOD (PORK) LOCK FLUSH 100 UNIT/ML IV SOLN
500.0000 [IU] | Freq: Once | INTRAVENOUS | Status: AC
  Administered 2019-11-22: 500 [IU] via INTRAVENOUS
  Filled 2019-11-22: qty 5

## 2019-11-22 MED ORDER — PREDNISONE 10 MG PO TABS: 10.0000 mg | ORAL_TABLET | ORAL | 0 refills | Status: DC

## 2019-11-22 NOTE — Progress Notes (Signed)
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 She presented to Dr. Wilburn Cornelia symptoms of right flank pain which was radiating to her front which prompted a CT abdomen. CT abdomen pelvis with contrast showed a 3.7 x 2.8 x 5 cm right adrenal mass. 1.7 x 1.2 x 2.1 cm left adrenal nodule 1.8 cm right renal simple cyst. And a new 3.6 x 3.2 cm spiculated mass in the right lower lobe with tethering of the adjacent pleura. Prominent hilar lymph nodes. This was followed by a PET CT scan on 09/20/2019 which showed a 3.6 cm right lower lobe mass which was markedly hypermetabolic along with hypermetabolic metastatic disease in the right supraclavicular space as well as mediastinum and right hilum and hypermetabolic metastatic disease in bilateral adrenal glands. She will also noted to have bilateral hypermetabolic thyroid nodules and a possible nodule in the pelvis adjacent to the sigmoid colon.  Patient currently reports ongoing pain in her right flank which is relatively well controlled with hydrocodone. She also has occasional nausea for which she has as needed nausea medications. She is here with her twin sister today and is highly anxious Of note patient has a history of central core myopathy and follows up with neuromuscular clinic at [facility]. Patient states that her twin sister has had episodes of hyperthermia but patient herself has not had these  episodes. I did review [facility] neuromuscular clinic note and the hyperthermia has been attributed to her history of hyperthyroidism and thyroid dysfunction rather than malignant hyperthermia. However given her history of myopathy patient does carry risk of malignant hyperthermia.  Not enough tissue was available for NGS testing.  Patient therefore underwentLiquid biopsy which showed evidence of PI K3 CA amplification, RIC POR amplification, T p53   Interval history-patient reported having significant nausea around day 4 after her chemotherapy.  Reports Zofran has been helping but would like to see if she can take a higher dose.  Also developed skin rash mainly over her scalp as well as the back and trunk over the last few days.  Rash is red and intensely itchy.  She had a fever yesterday of 103.  Denies any cough, shortness of breath or burning urination.  She is keeping up with her oral intake well.  Reports that her mouth is sore but manageable.  It is hard for her to eat but she is keeping up with her oral intake and fluids.  ECOG PS- 1 Pain scale- 4 Opioid associated constipation- no  Review of systems- Review of Systems  Constitutional: Positive for malaise/fatigue. Negative for chills, fever and weight loss.  HENT: Negative for congestion, ear discharge and nosebleeds.   Eyes: Negative for blurred vision.  Respiratory: Negative for cough, hemoptysis, sputum production, shortness of breath and wheezing.   Cardiovascular: Negative for chest pain, palpitations, orthopnea and claudication.  Gastrointestinal: Negative for abdominal pain, blood in stool, constipation, diarrhea, heartburn, melena, nausea and vomiting.  Genitourinary: Negative for dysuria, flank pain, frequency,  hematuria and urgency.  Musculoskeletal: Negative for back pain, joint pain and myalgias.  Skin: Positive for rash.  Neurological: Negative for dizziness, tingling, focal weakness, seizures, weakness and headaches.    Endo/Heme/Allergies: Does not bruise/bleed easily.  Psychiatric/Behavioral: Negative for depression and suicidal ideas. The patient does not have insomnia.      Allergies  Allergen Reactions  . Paxil [Paroxetine Hcl] Other (See Comments)    Hallucinations   . Ciprofloxacin Other (See Comments)    Unknown  . Levaquin [Levofloxacin] Other (See Comments)    Unknown   . 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 She has an increase risk of malignant hyperthermia with anesthesia. She should not receive Propofol.   Donne Hazel [Nefazodone] Other (See Comments)    Unknown   . Biaxin [Clarithromycin] Nausea Only  . Erythromycin Nausea Only  . Penicillins Rash     Past Medical History:  Diagnosis Date  . Anxiety   . Arthritis   . Chronic kidney disease   . Family history of breast cancer    aunt  . GERD (gastroesophageal reflux disease)   . History of DVT of lower extremity    Left leg  . Lung cancer ([place])    with brain mets  . Lung mass   . Malignant hyperthermia   . 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 Muscular dystrophy [facility])      Past Surgical History:  Procedure Laterality Date  . CESAREAN SECTION  11/1985  . COLONOSCOPY N/A 10/27/2015   Procedure: COLONOSCOPY;  Surgeon: Manya Silvas, MD;  Location: [facility] ENDOSCOPY;  Service: Endoscopy;  Laterality: N/A;   NO Propofol - per office  . LITHOTRIPSY    . PORTA CATH INSERTION N/A 11/09/2019   Procedure: PORTA CATH INSERTION;  Surgeon: Algernon Huxley, MD;  Location: [place] CV LAB;  Service: Cardiovascular;  Laterality: N/A;  . TUBAL LIGATION  03/1986    Social History   Socioeconomic History  . Marital status: Divorced    Spouse name: Not on file  . Number of children: Not on file  . Years of education: Not on file  . Highest education level: Not on file  Occupational History  . Not on file  Tobacco Use   . Smoking status: Former Smoker    Packs/day: 1.00    Types: Cigarettes    Quit date: 05/2016    Years since quitting: 3.5  . Smokeless tobacco: Never Used  Substance and Sexual Activity  . Alcohol use: No  . Drug use: No  . Sexual activity: Not on file  Other Topics Concern  . Not on file  Social History Narrative  . Not on file   Social Determinants of Health   Financial Resource Strain:   . Difficulty of Paying Living Expenses: Not on file  Food Insecurity:   . Worried About Charity fundraiser in the Last Year: Not on file  . Ran Out of Food in the Last Year: Not on file  Transportation Needs:   . Lack of Transportation (Medical): Not on file  . Lack of Transportation (Non-Medical): Not on file  Physical Activity:   . Days of Exercise per Week: Not on file  . Minutes of Exercise per Session: Not on file  Stress:   . Feeling of Stress : Not on file  Social Connections:   .  Frequency of Communication with Friends and Family: Not on file  . Frequency of Social Gatherings with Friends and Family: Not on file  . Attends Religious Services: Not on file  . Active Member of Clubs or Organizations: Not on file  . Attends Archivist Meetings: Not on file  . Marital Status: Not on file  Intimate Partner Violence:   . Fear of Current or Ex-Partner: Not on file  . Emotionally Abused: Not on file  . Physically Abused: Not on file  . Sexually Abused: Not on file    Family History  Problem Relation Age of Onset  . Cancer Father        bladder, lung  . Breast cancer Maternal Aunt 45     Current Outpatient Medications:  .  acetaminophen (TYLENOL) 325 MG tablet, Take 650 mg by mouth every 6 (six) hours as needed., Disp: , Rfl:  .  ALPRAZolam (XANAX) 0.25 MG tablet, Take 0.25 mg by mouth at bedtime as needed for anxiety., Disp: , Rfl:  .  apixaban (ELIQUIS) 5 MG TABS tablet, Take 1 tablet by mouth 2 (two) times daily., Disp: , Rfl:  .  citalopram (CELEXA) 20 MG  tablet, Take 1 tablet (20 mg total) by mouth daily., Disp: 30 tablet, Rfl: 2 .  dexamethasone (DECADRON) 2 MG tablet, Take 1 tablet (59m) daily for 5 days then take 1 tablet (266m every other day until complete. Start on 11/16/19., Disp: 10 tablet, Rfl: 0 .  dexamethasone (DECADRON) 4 MG tablet, Take 2 tablets (8 mg total) by mouth daily. Start the day after carboplatin chemotherapy for 3 days., Disp: 30 tablet, Rfl: 1 .  fluticasone (FLONASE) 50 MCG/ACT nasal spray, Place 2 sprays into both nostrils daily as needed. , Disp: , Rfl:  .  lidocaine-prilocaine (EMLA) cream, Apply to affected area once, Disp: 30 g, Rfl: 3 .  lubiprostone (AMITIZA) 24 MCG capsule, Take 1 capsule (24 mcg total) by mouth 2 (two) times daily with a meal., Disp: 60 capsule, Rfl: 0 .  magic mouthwash SOLN, Take 5 mLs by mouth 3 (three) times daily as needed for mouth pain., Disp: 300 mL, Rfl: 1 .  methimazole (TAPAZOLE) 5 MG tablet, Take 5 mg by mouth daily., Disp: , Rfl:  .  metoprolol succinate (TOPROL-XL) 25 MG 24 hr tablet, Take 12.5 tablets by mouth daily., Disp: , Rfl:  .  naloxegol oxalate (MOVANTIK) 25 MG TABS tablet, Take 1 tablet (25 mg total) by mouth daily., Disp: 30 tablet, Rfl: 2 .  nystatin (MYCOSTATIN) 100000 UNIT/ML suspension, Take 5 mLs (500,000 Units total) by mouth 4 (four) times daily. Swish and spit., Disp: 300 mL, Rfl: 0 .  ondansetron (ZOFRAN) 8 MG tablet, Take 1 tablet (8 mg total) by mouth 2 (two) times daily as needed for refractory nausea / vomiting. Start on day 3 after carboplatin chemo., Disp: 30 tablet, Rfl: 1 .  ondansetron (ZOFRAN-ODT) 4 MG disintegrating tablet, Take 1 tablet (4 mg total) by mouth every 8 (eight) hours as needed for nausea or vomiting., Disp: 30 tablet, Rfl: 2 .  Oxycodone HCl 10 MG TABS, Take 1 tablet (10 mg total) by mouth every 4 (four) hours as needed., Disp: 60 tablet, Rfl: 0 .  pantoprazole (PROTONIX) 20 MG tablet, Take 1 tablet (20 mg total) by mouth 2 (two) times  daily., Disp: 120 tablet, Rfl: 0 .  prochlorperazine (COMPAZINE) 10 MG tablet, Take 0.5-1 tablets (5-10 mg total) by mouth every 6 (six) hours as needed  for nausea or vomiting., Disp: 30 tablet, Rfl: 2 .  prochlorperazine (COMPAZINE) 10 MG tablet, Take 1 tablet (10 mg total) by mouth every 6 (six) hours as needed (Nausea or vomiting)., Disp: 30 tablet, Rfl: 1 .  sucralfate (CARAFATE) 1 g tablet, Take 1 tablet (1 g total) by mouth 3 (three) times daily. Dissolve in 3-4 tbsp warm water, swish and swallow., Disp: 90 tablet, Rfl: 3 .  vitamin B-12 (CYANOCOBALAMIN) 1000 MCG tablet, Take 1 tablet (1,000 mcg total) by mouth daily., Disp: 30 tablet, Rfl: 1 .  predniSONE (DELTASONE) 10 MG tablet, Take 1 tablet (10 mg total) by mouth as directed. 5 tablets daily  X 2 days, 4 tablet daily x 2 days, 3 tablet x 2 days, 2 tablet x2 days and then 1 tablet x 2 days with food every day, Disp: 30 tablet, Rfl: 0  Physical exam:  Vitals:   11/22/19 0904 11/22/19 0913  BP: 122/65 122/65  Pulse: 88 88  Temp:  99 F (37.2 C)  TempSrc: Tympanic Tympanic  Weight: 135 lb (61.2 kg) 135 lb (61.2 kg)   Physical Exam HENT:     Head: Normocephalic and atraumatic.     Mouth/Throat:     Comments: Few scattered oral ulcers are seen Eyes:     Pupils: Pupils are equal, round, and reactive to light.  Cardiovascular:     Rate and Rhythm: Normal rate and regular rhythm.     Heart sounds: Normal heart sounds.  Pulmonary:     Effort: Pulmonary effort is normal.     Breath sounds: Normal breath sounds.  Abdominal:     General: Bowel sounds are normal.     Palpations: Abdomen is soft.  Musculoskeletal:     Cervical back: Normal range of motion.  Skin:    Comments: Scattered erythematous maculopapular rash with some underlying edema seen mainly over the scalp but also over the lower back and around her sides.  Neurological:     Mental Status: She is alert and oriented to person, place, and time.      CMP Latest Ref  Rng & Units 11/22/2019  Glucose 70 - 99 mg/dL 101(H)  BUN 6 - 20 mg/dL 11  Creatinine 0.44 - 1.00 mg/dL 0.35(L)  Sodium 135 - 145 mmol/L 136  Potassium 3.5 - 5.1 mmol/L 3.9  Chloride 98 - 111 mmol/L 98  CO2 22 - 32 mmol/L 29  Calcium 8.9 - 10.3 mg/dL 8.7(L)  Total Protein 6.5 - 8.1 g/dL 6.2(L)  Total Bilirubin 0.3 - 1.2 mg/dL 0.3  Alkaline Phos 38 - 126 U/L 94  AST 15 - 41 U/L 20  ALT 0 - 44 U/L 32   CBC Latest Ref Rng & Units 11/22/2019  WBC 4.0 - 10.5 K/uL 8.2  Hemoglobin 12.0 - 15.0 g/dL 10.0(L)  Hematocrit 36.0 - 46.0 % 32.6(L)  Platelets 150 - 400 K/uL 201       PERIPHERAL VASCULAR CATHETERIZATION  Result Date: 11/09/2019 See op note  US Venous Img Lower Unilateral Left  Result Date: 11/08/2019 CLINICAL DATA:  59 year old female with left lower extremity swelling for 1 week EXAM: LEFT LOWER EXTREMITY VENOUS DOPPLER ULTRASOUND TECHNIQUE: Gray-scale sonography with graded compression, as well as color Doppler and duplex ultrasound were performed to evaluate the lower extremity deep venous systems from the level of the common femoral vein and including the common femoral, femoral, profunda femoral, popliteal and calf veins including the posterior tibial, peroneal and gastrocnemius veins when visible. The superficial great  saphenous vein was also interrogated. Spectral Doppler was utilized to evaluate flow at rest and with distal augmentation maneuvers in the common femoral, femoral and popliteal veins. COMPARISON:  None. FINDINGS: Contralateral Common Femoral Vein: Respiratory phasicity is normal and symmetric with the symptomatic side. No evidence of thrombus. Normal compressibility. Common Femoral Vein: No evidence of thrombus. Normal compressibility, respiratory phasicity and response to augmentation. Saphenofemoral Junction: No evidence of thrombus. Normal compressibility and flow on color Doppler imaging. Profunda Femoral Vein: No evidence of thrombus. Normal compressibility and  flow on color Doppler imaging. Femoral Vein: No evidence of thrombus. Normal compressibility, respiratory phasicity and response to augmentation. Popliteal Vein: No evidence of thrombus. Normal compressibility, respiratory phasicity and response to augmentation. Calf Veins: No evidence of thrombus. Normal compressibility and flow on color Doppler imaging. Superficial Great Saphenous Vein: No evidence of thrombus. Normal compressibility. Venous Reflux:  None. Other Findings:  None. IMPRESSION: No evidence of deep venous thrombosis. Electronically Signed   By: Jacqulynn Cadet M.D.   On: 11/08/2019 13:54     Assessment and plan- Patient is a 58 y.o. female withsquamous cell carcinoma of the lung stage IV [id number] with adrenal and brain metastases.  She is s/p 1 cycle of carbotaxol Keytruda and here for toxicity check after 1 cycle  Drug-induced skin rash: Pattern of rash appears to be drug-induced.  Unclear if it is due to [facility] versus Taxol.  I will hold off on giving her Keytruda for cycle 2 and see if her rash recurs.  If it does I will plan to restart Keytruda and switch her from Taxol to Abraxane.  I will give her Medrol Dosepak 50 mg with taper by 10 mg every other day for the next 10 days.  Also advised her to use over-the-counter Benadryl 12.5 to 25 mg every 6 as needed for itching.  Mouth ulcers: She can increase the dose of oxycodone to 2 pills at this time.  She is also getting doxepin mouthwash.  Fever: Possibly secondary to underlying drug-induced skin rash.  No signs and symptoms of infection clinically.  Continue to monitor but if she has recurrent fever she was asked to give [place] a call.  I will hold off on antibiotics at this time we will also hold off on giving her fluids at this time.  Her labs otherwise look unremarkable except for chemo-induced anemia.  Patient is keeping up with her oral intake  She will call us and let us know how she is doing next week.  I will tentatively see  her back on 12/03/2019 for cycle 2 of carbotaxol chemotherapy   Visit Diagnosis 1. Drug-induced skin rash   2. 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 Malignant neoplasm of lower lobe of right lung ([place])      Dr. Randa Evens, MD, MPH [facility] at [facility] [phone] 11/22/2019 10:17 AM

## 2019-11-22 NOTE — Progress Notes (Signed)
Patient stated that she had been with back pain, shakiness, numbness on bilateral hands, rash and redness all over her body.

## 2019-11-23 LAB — T4: T4, Total: 6.1 ug/dL (ref 4.5–12.0)

## 2019-11-28 ENCOUNTER — Other Ambulatory Visit: Payer: Self-pay

## 2019-11-28 ENCOUNTER — Ambulatory Visit: Payer: Medicare Other

## 2019-11-28 ENCOUNTER — Inpatient Hospital Stay (HOSPITAL_BASED_OUTPATIENT_CLINIC_OR_DEPARTMENT_OTHER): Payer: Medicare Other | Admitting: Oncology

## 2019-11-28 ENCOUNTER — Telehealth: Payer: Self-pay | Admitting: *Deleted

## 2019-11-28 ENCOUNTER — Encounter: Payer: Self-pay | Admitting: *Deleted

## 2019-11-28 VITALS — BP 126/78 | HR 76 | Temp 97.5°F | Resp 18

## 2019-11-28 DIAGNOSIS — C3431 Malignant neoplasm of lower lobe, right bronchus or lung: Secondary | ICD-10-CM | POA: Diagnosis not present

## 2019-11-28 DIAGNOSIS — R112 Nausea with vomiting, unspecified: Secondary | ICD-10-CM | POA: Diagnosis not present

## 2019-11-28 DIAGNOSIS — Z95828 Presence of other vascular implants and grafts: Secondary | ICD-10-CM

## 2019-11-28 DIAGNOSIS — Z5111 Encounter for antineoplastic chemotherapy: Secondary | ICD-10-CM | POA: Diagnosis not present

## 2019-11-28 DIAGNOSIS — R531 Weakness: Secondary | ICD-10-CM

## 2019-11-28 DIAGNOSIS — Z5189 Encounter for other specified aftercare: Secondary | ICD-10-CM

## 2019-11-28 DIAGNOSIS — R110 Nausea: Secondary | ICD-10-CM

## 2019-11-28 LAB — CBC WITH DIFFERENTIAL/PLATELET
Abs Immature Granulocytes: 1.02 10*3/uL — ABNORMAL HIGH (ref 0.00–0.07)
Basophils Absolute: 0.2 10*3/uL — ABNORMAL HIGH (ref 0.0–0.1)
Basophils Relative: 1 %
Eosinophils Absolute: 0 10*3/uL (ref 0.0–0.5)
Eosinophils Relative: 0 %
HCT: 32.7 % — ABNORMAL LOW (ref 36.0–46.0)
Hemoglobin: 10.5 g/dL — ABNORMAL LOW (ref 12.0–15.0)
Immature Granulocytes: 8 %
Lymphocytes Relative: 11 %
Lymphs Abs: 1.5 10*3/uL (ref 0.7–4.0)
MCH: 30.4 pg (ref 26.0–34.0)
MCHC: 32.1 g/dL (ref 30.0–36.0)
MCV: 94.8 fL (ref 80.0–100.0)
Monocytes Absolute: 1 10*3/uL (ref 0.1–1.0)
Monocytes Relative: 8 %
Neutro Abs: 9.6 10*3/uL — ABNORMAL HIGH (ref 1.7–7.7)
Neutrophils Relative %: 72 %
Platelets: 481 10*3/uL — ABNORMAL HIGH (ref 150–400)
RBC: 3.45 MIL/uL — ABNORMAL LOW (ref 3.87–5.11)
RDW: 16.3 % — ABNORMAL HIGH (ref 11.5–15.5)
WBC: 13.3 10*3/uL — ABNORMAL HIGH (ref 4.0–10.5)
nRBC: 0 % (ref 0.0–0.2)

## 2019-11-28 LAB — COMPREHENSIVE METABOLIC PANEL
ALT: 31 U/L (ref 0–44)
AST: 18 U/L (ref 15–41)
Albumin: 3.2 g/dL — ABNORMAL LOW (ref 3.5–5.0)
Alkaline Phosphatase: 77 U/L (ref 38–126)
Anion gap: 9 (ref 5–15)
BUN: 29 mg/dL — ABNORMAL HIGH (ref 6–20)
CO2: 30 mmol/L (ref 22–32)
Calcium: 8.9 mg/dL (ref 8.9–10.3)
Chloride: 97 mmol/L — ABNORMAL LOW (ref 98–111)
Creatinine, Ser: 0.38 mg/dL — ABNORMAL LOW (ref 0.44–1.00)
GFR calc Af Amer: >60 mL/min (ref >60)
GFR calc non Af Amer: >60 mL/min (ref >60)
Glucose, Bld: 97 mg/dL (ref 70–99)
Potassium: 4.3 mmol/L (ref 3.5–5.1)
Sodium: 136 mmol/L (ref 135–145)
Total Bilirubin: 0.3 mg/dL (ref 0.3–1.2)
Total Protein: 6.7 g/dL (ref 6.5–8.1)

## 2019-11-28 MED ORDER — DEXAMETHASONE SODIUM PHOSPHATE 10 MG/ML IJ SOLN
10.0000 mg | Freq: Once | INTRAMUSCULAR | Status: AC
  Administered 2019-11-28: 10 mg via INTRAVENOUS
  Filled 2019-11-28: qty 1

## 2019-11-28 MED ORDER — SODIUM CHLORIDE 0.9% FLUSH
10.0000 mL | INTRAVENOUS | Status: DC | PRN
  Administered 2019-11-28: 10 mL via INTRAVENOUS
  Filled 2019-11-28: qty 10

## 2019-11-28 MED ORDER — HEPARIN SOD (PORK) LOCK FLUSH 100 UNIT/ML IV SOLN
500.0000 [IU] | Freq: Once | INTRAVENOUS | Status: AC
  Administered 2019-11-28: 500 [IU] via INTRAVENOUS
  Filled 2019-11-28: qty 5

## 2019-11-28 MED ORDER — ONDANSETRON HCL 4 MG/2ML IJ SOLN
8.0000 mg | Freq: Once | INTRAMUSCULAR | Status: AC
  Administered 2019-11-28: 8 mg via INTRAVENOUS
  Filled 2019-11-28: qty 4

## 2019-11-28 MED ORDER — SODIUM CHLORIDE 0.9 % IV SOLN
Freq: Once | INTRAVENOUS | Status: AC
  Filled 2019-11-28: qty 250

## 2019-11-28 NOTE — Progress Notes (Signed)
  Oncology Nurse Navigator Documentation  Navigator Location: [facility]-Med Onc (11/28/19 1400)   )Navigator Encounter Type: Follow-up Appt (11/28/19 1400)                     Patient Visit Type: MedOnc (11/28/19 1400)   Barriers/Navigation Needs: No Needs;No Questions (11/28/19 1400)   Interventions: Referrals (11/28/19 1400) Referrals: Symptom Management (11/28/19 1400)         met with patient during symptom management visit today. Pt stated that she is feeling better after IV fluids. Pt did not have any needs or questions at this time. Reassurance provided. Will follow up if pt has any further needs.            Time Spent with Patient: 45 (11/28/19 1400)

## 2019-11-28 NOTE — Progress Notes (Signed)
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  Chief complaint/ Reason for visit- Nausea/Vomiting  Heme/Onc history:  Oncology History  Malignant neoplasm of lower lobe of right lung ([place])  09/20/2019 Initial Diagnosis   Malignant neoplasm of lower lobe of right lung ([place])   10/05/2019 Cancer Staging   Staging form: Lung, AJCC 8th Edition - Clinical stage from 10/05/2019: Stage IV (cT2, cN2, cM1) - Signed by Sindy Guadeloupe, MD on 10/08/2019   11/12/2019 -  Chemotherapy   The patient had palonosetron (ALOXI) injection 0.25 mg, 0.25 mg, Intravenous,  Once, 1 of 4 cycles Administration: 0.25 mg (11/12/2019) pegfilgrastim-cbqv (UDENYCA) injection 6 mg, 6 mg, Subcutaneous, Once, 1 of 4 cycles Administration: 6 mg (11/13/2019) CARBOplatin (PARAPLATIN) 500 mg in sodium chloride 0.9 % 250 mL chemo infusion, 500 mg (100 % of original dose 500 mg), Intravenous,  Once, 1 of 4 cycles Dose modification:   (original dose 487.5 mg, Cycle 1), 500 mg (original dose 500 mg, Cycle 1, Reason: Patient Age, Comment: per md) Administration: 500 mg (11/12/2019) PACLitaxel (TAXOL) 324 mg in sodium chloride 0.9 % 500 mL chemo infusion (> 80mg /m2), 200 mg/m2 = 324 mg, Intravenous,  Once, 1 of 4 cycles Administration: 324 mg (11/12/2019) pembrolizumab (KEYTRUDA) 200 mg in sodium chloride 0.9 % 50 mL chemo infusion, 200 mg, Intravenous, Once, 1 of 6 cycles Administration: 200 mg (11/12/2019) fosaprepitant (EMEND) 150 mg, dexamethasone (DECADRON) 12 mg in sodium chloride 0.9 % 145 mL IVPB, , Intravenous,  Once, 1 of 4 cycles Administration:   (11/12/2019)  for chemotherapy treatment.      Interval history-patient presents to clinic today for reports of nausea and vomiting that began today that or not resolved with current antiemetics.  She is status post 1 cycle of carbo/Taxol and Keytruda that was given on 11/12/2019.  She was evaluated last week on 11/22/2019 by Dr. Janese Banks to assess tolerance of her first cycle of treatment.  She was found to have a drug-induced skin rash on her head and back.  She was started on a Medrol Dosepak and advised to take OTC Benadryl for itching.  She also complained of mouth ulcers and received doxepin mouthwash.  Complained of fever thought to be secondary to drug-induced skin rash.  No signs of systemic infection.  She was instructed to call if fever returned.  This morning, patient states that she developed nausea unrelieved with Zofran 4 mg ODT.  She denies any vomiting but admits to persistent nausea interfering with her ADLs.  Patient is accompanied with her longtime friend.  Patient's friend states that she feels that Mrs. Wormley over exerted herself yesterday while performing housework.  States she cleaned out the fridge and cleaned the floors.  Patient states "I just do not feel well".  She admits to generalized weakness more so today.  She wished to be evaluated and have her labs drawn.  She denies any constipation or diarrhea.  She denies any urinary concerns.  Her appetite is stable.   ECOG FS:1 - Symptomatic but completely ambulatory  Review of systems- Review of Systems  Constitutional: Positive for malaise/fatigue. Negative for chills, fever  and weight loss.  HENT: Negative for congestion, ear pain and tinnitus.   Eyes: Negative.  Negative for blurred vision and double vision.  Respiratory: Negative.  Negative for cough, sputum production and shortness of breath.   Cardiovascular: Negative.  Negative for chest pain, palpitations and leg swelling.  Gastrointestinal: Negative.  Negative for  abdominal pain, constipation, diarrhea, nausea and vomiting.       Abdominal bloating  Genitourinary: Negative for dysuria, frequency and urgency.  Musculoskeletal: Positive for myalgias. Negative for back pain and falls.  Skin: Negative.  Negative for rash.  Neurological: Positive for weakness and headaches.  Endo/Heme/Allergies: Negative.  Does not bruise/bleed easily.  Psychiatric/Behavioral: Negative for depression. The patient is nervous/anxious. The patient does not have insomnia.      Current treatment- s/p cycle 1 carbo/Taxol with Keytruda.  Allergies  Allergen Reactions  . Paxil [Paroxetine Hcl] Other (See Comments)    Hallucinations   . Ciprofloxacin Other (See Comments)    Unknown  . Levaquin [Levofloxacin] Other (See Comments)    Unknown   . 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 She has an increase risk of malignant hyperthermia with anesthesia. She should not receive Propofol.   Donne Hazel [Nefazodone] Other (See Comments)    Unknown   . Biaxin [Clarithromycin] Nausea Only  . Erythromycin Nausea Only  . Penicillins Rash     Past Medical History:  Diagnosis Date  . Anxiety   . Arthritis   . Chronic kidney disease   . Family history of breast cancer    aunt  . GERD (gastroesophageal reflux disease)   . History of DVT of lower extremity    Left leg  . Lung cancer ([place])    with brain mets  . Lung mass   . Malignant hyperthermia   . 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 Muscular dystrophy [facility])      Past Surgical History:  Procedure Laterality Date  . CESAREAN SECTION  11/1985  . COLONOSCOPY N/A 10/27/2015   Procedure: COLONOSCOPY;  Surgeon: Manya Silvas, MD;  Location: [facility];  Service: Endoscopy;  Laterality: N/A;   NO Propofol - per office  . LITHOTRIPSY    . PORTA CATH INSERTION N/A 11/09/2019   Procedure: PORTA CATH INSERTION;   Surgeon: Algernon Huxley, MD;  Location: [place] [facility];  Service: Cardiovascular;  Laterality: N/A;  . TUBAL LIGATION  03/1986    Social History   Socioeconomic History  . Marital status: Divorced    Spouse name: Not on file  . Number of children: Not on file  . Years of education: Not on file  . Highest education level: Not on file  Occupational History  . Not on file  Tobacco Use  . Smoking status: Former Smoker    Packs/day: 1.00    Types: Cigarettes    Quit date: 05/2016    Years since quitting: 3.5  . Smokeless tobacco: Never Used  Substance and Sexual Activity  . Alcohol use: No  . Drug use: No  . Sexual activity: Not on file  Other Topics Concern  . Not on file  Social History Narrative  . Not on file   Social Determinants of Health   Financial Resource Strain:   . Difficulty of Paying Living Expenses: Not on file  Food Insecurity:   . Worried About Estate manager/land agent  of Food in the Last Year: Not on file  . Ran Out of Food in the Last Year: Not on file  Transportation Needs:   . Lack of Transportation (Medical): Not on file  . Lack of Transportation (Non-Medical): Not on file  Physical Activity:   . Days of Exercise per Week: Not on file  . Minutes of Exercise per Session: Not on file  Stress:   . Feeling of Stress : Not on file  Social Connections:   . Frequency of Communication with Friends and Family: Not on file  . Frequency of Social Gatherings with Friends and Family: Not on file  . Attends Religious Services: Not on file  . Active Member of Clubs or Organizations: Not on file  . Attends Archivist Meetings: Not on file  . Marital Status: Not on file  Intimate Partner Violence:   . Fear of Current or Ex-Partner: Not on file  . Emotionally Abused: Not on file  . Physically Abused: Not on file  . Sexually Abused: Not on file    Family History  Problem Relation Age of Onset  . Cancer Father        bladder, lung  . Breast cancer Maternal  Aunt 45     Current Outpatient Medications:  .  acetaminophen (TYLENOL) 325 MG tablet, Take 650 mg by mouth every 6 (six) hours as needed., Disp: , Rfl:  .  ALPRAZolam (XANAX) 0.25 MG tablet, Take 0.25 mg by mouth at bedtime as needed for anxiety., Disp: , Rfl:  .  apixaban (ELIQUIS) 5 MG TABS tablet, Take 1 tablet by mouth 2 (two) times daily., Disp: , Rfl:  .  citalopram (CELEXA) 20 MG tablet, Take 1 tablet (20 mg total) by mouth daily., Disp: 30 tablet, Rfl: 2 .  dexamethasone (DECADRON) 4 MG tablet, Take 2 tablets (8 mg total) by mouth daily. Start the day after carboplatin chemotherapy for 3 days., Disp: 30 tablet, Rfl: 1 .  fluticasone (FLONASE) 50 MCG/ACT nasal spray, Place 2 sprays into both nostrils daily as needed. , Disp: , Rfl:  .  lidocaine-prilocaine (EMLA) cream, Apply to affected area once, Disp: 30 g, Rfl: 3 .  magic mouthwash SOLN, Take 5 mLs by mouth 3 (three) times daily as needed for mouth pain., Disp: 300 mL, Rfl: 1 .  methimazole (TAPAZOLE) 5 MG tablet, Take 5 mg by mouth daily., Disp: , Rfl:  .  metoprolol succinate (TOPROL-XL) 25 MG 24 hr tablet, Take 12.5 tablets by mouth daily., Disp: , Rfl:  .  ondansetron (ZOFRAN-ODT) 4 MG disintegrating tablet, Take 1 tablet (4 mg total) by mouth every 8 (eight) hours as needed for nausea or vomiting., Disp: 30 tablet, Rfl: 2 .  Oxycodone HCl 10 MG TABS, Take 1 tablet (10 mg total) by mouth every 4 (four) hours as needed., Disp: 60 tablet, Rfl: 0 .  pantoprazole (PROTONIX) 20 MG tablet, Take 1 tablet (20 mg total) by mouth 2 (two) times daily., Disp: 120 tablet, Rfl: 0 .  predniSONE (DELTASONE) 10 MG tablet, Take 1 tablet (10 mg total) by mouth as directed. 5 tablets daily  X 2 days, 4 tablet daily x 2 days, 3 tablet x 2 days, 2 tablet x2 days and then 1 tablet x 2 days with food every day, Disp: 30 tablet, Rfl: 0 .  nystatin (MYCOSTATIN) 100000 UNIT/ML suspension, Take 5 mLs (500,000 Units total) by mouth 4 (four) times daily. Swish  and spit. (Patient not taking: Reported on 11/28/2019),  Disp: 300 mL, Rfl: 0 .  ondansetron (ZOFRAN) 8 MG tablet, Take 1 tablet (8 mg total) by mouth 2 (two) times daily as needed for refractory nausea / vomiting. Start on day 3 after carboplatin chemo. (Patient not taking: Reported on 11/28/2019), Disp: 30 tablet, Rfl: 1 .  prochlorperazine (COMPAZINE) 10 MG tablet, Take 0.5-1 tablets (5-10 mg total) by mouth every 6 (six) hours as needed for nausea or vomiting. (Patient not taking: Reported on 11/28/2019), Disp: 30 tablet, Rfl: 2 .  prochlorperazine (COMPAZINE) 10 MG tablet, Take 1 tablet (10 mg total) by mouth every 6 (six) hours as needed (Nausea or vomiting). (Patient not taking: Reported on 11/28/2019), Disp: 30 tablet, Rfl: 1 .  sucralfate (CARAFATE) 1 g tablet, Take 1 tablet (1 g total) by mouth 3 (three) times daily. Dissolve in 3-4 tbsp warm water, swish and swallow., Disp: 90 tablet, Rfl: 3 .  vitamin B-12 (CYANOCOBALAMIN) 1000 MCG tablet, Take 1 tablet (1,000 mcg total) by mouth daily., Disp: 30 tablet, Rfl: 1  Physical exam:  Vitals:   11/28/19 1351  BP: 126/78  Pulse: 76  Resp: 18  Temp: (!) 97.5 F (36.4 C)  TempSrc: Tympanic   Physical Exam Constitutional:      Appearance: Normal appearance.  HENT:     Head: Normocephalic and atraumatic.  Eyes:     Pupils: Pupils are equal, round, and reactive to light.  Cardiovascular:     Rate and Rhythm: Normal rate and regular rhythm.     Heart sounds: Normal heart sounds. No murmur.  Pulmonary:     Effort: Pulmonary effort is normal.     Breath sounds: Normal breath sounds. No wheezing.  Abdominal:     General: Bowel sounds are normal. There is no distension.     Palpations: Abdomen is soft.     Tenderness: There is no abdominal tenderness.     Comments: Soft abdomen-good bowel sounds.  Musculoskeletal:        General: Normal range of motion.     Cervical back: Normal range of motion.  Skin:    General: Skin is warm and  dry.     Findings: Rash (imrpoved) present.  Neurological:     Mental Status: She is alert and oriented to person, place, and time.  Psychiatric:        Judgment: Judgment normal.      CMP Latest Ref Rng & Units 11/28/2019  Glucose 70 - 99 mg/dL 97  BUN 6 - 20 mg/dL 29(H)  Creatinine 0.44 - 1.00 mg/dL 0.38(L)  Sodium 135 - 145 mmol/L 136  Potassium 3.5 - 5.1 mmol/L 4.3  Chloride 98 - 111 mmol/L 97(L)  CO2 22 - 32 mmol/L 30  Calcium 8.9 - 10.3 mg/dL 8.9  Total Protein 6.5 - 8.1 g/dL 6.7  Total Bilirubin 0.3 - 1.2 mg/dL 0.3  Alkaline Phos 38 - 126 U/L 77  AST 15 - 41 U/L 18  ALT 0 - 44 U/L 31   CBC Latest Ref Rng & Units 11/28/2019  WBC 4.0 - 10.5 K/uL 13.3(H)  Hemoglobin 12.0 - 15.0 g/dL 10.5(L)  Hematocrit 36.0 - 46.0 % 32.7(L)  Platelets 150 - 400 K/uL 481(H)    No images are attached to the encounter.  PERIPHERAL VASCULAR CATHETERIZATION  Result Date: 11/09/2019 See op note  US Venous Img Lower Unilateral Left  Result Date: 11/08/2019 CLINICAL DATA:  58 year old female with left lower extremity swelling for 1 week EXAM: LEFT LOWER EXTREMITY VENOUS DOPPLER ULTRASOUND TECHNIQUE:  Gray-scale sonography with graded compression, as well as color Doppler and duplex ultrasound were performed to evaluate the lower extremity deep venous systems from the level of the common femoral vein and including the common femoral, femoral, profunda femoral, popliteal and calf veins including the posterior tibial, peroneal and gastrocnemius veins when visible. The superficial great saphenous vein was also interrogated. Spectral Doppler was utilized to evaluate flow at rest and with distal augmentation maneuvers in the common femoral, femoral and popliteal veins. COMPARISON:  None. FINDINGS: Contralateral Common Femoral Vein: Respiratory phasicity is normal and symmetric with the symptomatic side. No evidence of thrombus. Normal compressibility. Common Femoral Vein: No evidence of thrombus. Normal  compressibility, respiratory phasicity and response to augmentation. Saphenofemoral Junction: No evidence of thrombus. Normal compressibility and flow on color Doppler imaging. Profunda Femoral Vein: No evidence of thrombus. Normal compressibility and flow on color Doppler imaging. Femoral Vein: No evidence of thrombus. Normal compressibility, respiratory phasicity and response to augmentation. Popliteal Vein: No evidence of thrombus. Normal compressibility, respiratory phasicity and response to augmentation. Calf Veins: No evidence of thrombus. Normal compressibility and flow on color Doppler imaging. Superficial Great Saphenous Vein: No evidence of thrombus. Normal compressibility. Venous Reflux:  None. Other Findings:  None. IMPRESSION: No evidence of deep venous thrombosis. Electronically Signed   By: Jacqulynn Cadet M.D.   On: 11/08/2019 13:54     Assessment and plan- Patient is a 58 y.o. female who presents for persistent nausea for the last 24 hours.  Stage IV squamous cell carcinoma of the lung with adrenal and brain metastasis: Status post 1 cycle of carbo/Taxol with Keytruda and Neulasta.  Last given on 11/12/2019.  She is scheduled to return to clinic on 12/03/2023 consideration of cycle 2 carbo/Taxol with Keytruda.  Drug-induced skin rash: Much improved after completing Medrol Dosepak.  Mouth ulcers: Resolved.  Fever: No additional fevers.  Persistent nausea: Instructed patient to continue antiemetics as prescribed.  Can increase Zofran to 8 mg every 8 hours for nausea.  Asked that she rotate Compazine.  We will give her IV Zofran while in clinic today.  Patient noted improvement of nausea prior to leaving clinic today.  Chronic headache: Multifactorial.  Secondary to brain metastasis status post whole brain radiation.  She is also taking steroids which can cause a headache.  Anxiety: Continue 0.25 mg Xanax at bedtime.  She is also on Celexa 20 mg tablets daily.  Neoplasm related  pain: Continue narcotics as prescribed.  Leukocytosis: White count 13.3.  Patient received on pro-Neulasta.  No current signs of infection.  Plan: Labs. Stable VS.  Stable Give 1 liter NacL. Give 8 mg Zofran.  Give 10 mg Decadron. Continue current antiemetics as prescribed.  Disposition:  RTC as scheduled on 12/03/2019 for consideration of cycle 2 carbo/Taxol with Keytruda.   Visit Diagnosis 1. Malignant neoplasm of lower lobe of right lung ([place])   2. Intractable vomiting with nausea, unspecified vomiting type     Patient expressed understanding and was in agreement with this plan. She also understands that She can call clinic at any time with any questions, concerns, or complaints.   Greater than 50% was spent in counseling and coordination of care with this patient including but not limited to discussion of the relevant topics above (See A&P) including, but not limited to diagnosis and management of acute and chronic medical conditions.   Thank you for allowing me to participate in the care of this very pleasant patient.    Wandra Feinstein  Quay Burow, NP [facility] Cell - [phone] Pager- [phone] 11/29/2019 9:46 AM  CC: Dr Janese Banks

## 2019-11-28 NOTE — Patient Instructions (Signed)
I am so sorry you are not feeling well today.  Today while you are in clinic you were given 1 L of normal saline, 8 mg Zofran and 10 mg of Decadron.  Try taking 2 of your 4 mg Zofran disintegrating tablets instead of just 1 to see if this helps with your nausea.  You also can rotate your Zofran and Compazine to see if this offers better coverage.  The Zofran might work quicker but the Compazine last a little longer.  I will call you tomorrow to check up on you and see how you are feeling.  Faythe Casa, NP 11/28/2019 3:01 PM

## 2019-11-28 NOTE — Telephone Encounter (Signed)
Pt is having nausea and vomiting today that is not controlled with medication. Per Sonia Baller, pt can be seen today in [facility] at 1pm for poss IVF. Pt has been made aware. Nothing further needed at this time.

## 2019-12-02 ENCOUNTER — Telehealth: Payer: Self-pay | Admitting: Hematology and Oncology

## 2019-12-02 NOTE — Telephone Encounter (Signed)
Re:  Nausea  Twice over weekend Deborah Nguyen has called about patient's chronic nausea.  She is now taking anti-emetics around the clock.    She is taking ondansetron 8 mg po q 8 hours as well as compazine prn.  Fluid intake is good.  She is eating without emesis.  She is voiding well.  She is having bowel movements; she is taking Miralax twice a day and stool softeners.  She denies any fever.  She notes a metallic taste in her mouth.  She denies any light headedness or dizziness.  She has been out of bed: gathering laundry, washing dishes, and walking.  She is scheduled for chemotherapy tomorrow.   Lequita Asal, MD

## 2019-12-03 ENCOUNTER — Other Ambulatory Visit: Payer: Self-pay | Admitting: *Deleted

## 2019-12-03 ENCOUNTER — Inpatient Hospital Stay (HOSPITAL_BASED_OUTPATIENT_CLINIC_OR_DEPARTMENT_OTHER): Payer: Medicare Other | Admitting: Oncology

## 2019-12-03 ENCOUNTER — Encounter: Payer: Self-pay | Admitting: Oncology

## 2019-12-03 ENCOUNTER — Inpatient Hospital Stay: Payer: Medicare Other

## 2019-12-03 ENCOUNTER — Inpatient Hospital Stay (HOSPITAL_BASED_OUTPATIENT_CLINIC_OR_DEPARTMENT_OTHER): Payer: Medicare Other | Admitting: Hospice and Palliative Medicine

## 2019-12-03 ENCOUNTER — Telehealth: Payer: Self-pay | Admitting: Hematology and Oncology

## 2019-12-03 ENCOUNTER — Other Ambulatory Visit: Payer: Self-pay

## 2019-12-03 VITALS — BP 127/82 | HR 87 | Temp 98.7°F | Wt 136.0 lb

## 2019-12-03 DIAGNOSIS — Z515 Encounter for palliative care: Secondary | ICD-10-CM

## 2019-12-03 DIAGNOSIS — C3431 Malignant neoplasm of lower lobe, right bronchus or lung: Secondary | ICD-10-CM

## 2019-12-03 DIAGNOSIS — G893 Neoplasm related pain (acute) (chronic): Secondary | ICD-10-CM | POA: Diagnosis not present

## 2019-12-03 DIAGNOSIS — C7971 Secondary malignant neoplasm of right adrenal gland: Secondary | ICD-10-CM | POA: Diagnosis not present

## 2019-12-03 DIAGNOSIS — Z5111 Encounter for antineoplastic chemotherapy: Secondary | ICD-10-CM | POA: Diagnosis not present

## 2019-12-03 DIAGNOSIS — R110 Nausea: Secondary | ICD-10-CM | POA: Diagnosis not present

## 2019-12-03 DIAGNOSIS — C7972 Secondary malignant neoplasm of left adrenal gland: Secondary | ICD-10-CM

## 2019-12-03 DIAGNOSIS — T451X5A Adverse effect of antineoplastic and immunosuppressive drugs, initial encounter: Secondary | ICD-10-CM

## 2019-12-03 DIAGNOSIS — C7931 Secondary malignant neoplasm of brain: Secondary | ICD-10-CM | POA: Diagnosis not present

## 2019-12-03 LAB — CBC WITH DIFFERENTIAL/PLATELET
Abs Immature Granulocytes: 0.58 10*3/uL — ABNORMAL HIGH (ref 0.00–0.07)
Basophils Absolute: 0.2 10*3/uL — ABNORMAL HIGH (ref 0.0–0.1)
Basophils Relative: 1 %
Eosinophils Absolute: 0.1 10*3/uL (ref 0.0–0.5)
Eosinophils Relative: 0 %
HCT: 34.2 % — ABNORMAL LOW (ref 36.0–46.0)
Hemoglobin: 10.3 g/dL — ABNORMAL LOW (ref 12.0–15.0)
Immature Granulocytes: 5 %
Lymphocytes Relative: 8 %
Lymphs Abs: 1 10*3/uL (ref 0.7–4.0)
MCH: 28.9 pg (ref 26.0–34.0)
MCHC: 30.1 g/dL (ref 30.0–36.0)
MCV: 96.1 fL (ref 80.0–100.0)
Monocytes Absolute: 1.3 10*3/uL — ABNORMAL HIGH (ref 0.1–1.0)
Monocytes Relative: 10 %
Neutro Abs: 9.2 10*3/uL — ABNORMAL HIGH (ref 1.7–7.7)
Neutrophils Relative %: 76 %
Platelets: 623 10*3/uL — ABNORMAL HIGH (ref 150–400)
RBC: 3.56 MIL/uL — ABNORMAL LOW (ref 3.87–5.11)
RDW: 16.4 % — ABNORMAL HIGH (ref 11.5–15.5)
WBC: 12.2 10*3/uL — ABNORMAL HIGH (ref 4.0–10.5)
nRBC: 0 % (ref 0.0–0.2)

## 2019-12-03 LAB — COMPREHENSIVE METABOLIC PANEL
ALT: 27 U/L (ref 0–44)
AST: 19 U/L (ref 15–41)
Albumin: 3.1 g/dL — ABNORMAL LOW (ref 3.5–5.0)
Alkaline Phosphatase: 71 U/L (ref 38–126)
Anion gap: 10 (ref 5–15)
BUN: 16 mg/dL (ref 6–20)
CO2: 28 mmol/L (ref 22–32)
Calcium: 9 mg/dL (ref 8.9–10.3)
Chloride: 100 mmol/L (ref 98–111)
Creatinine, Ser: 0.35 mg/dL — ABNORMAL LOW (ref 0.44–1.00)
GFR calc Af Amer: >60 mL/min (ref >60)
GFR calc non Af Amer: >60 mL/min (ref >60)
Glucose, Bld: 109 mg/dL — ABNORMAL HIGH (ref 70–99)
Potassium: 4 mmol/L (ref 3.5–5.1)
Sodium: 138 mmol/L (ref 135–145)
Total Bilirubin: 0.4 mg/dL (ref 0.3–1.2)
Total Protein: 6.9 g/dL (ref 6.5–8.1)

## 2019-12-03 LAB — TSH: TSH: 0.763 u[IU]/mL (ref 0.350–4.500)

## 2019-12-03 MED ORDER — FAMOTIDINE IN NACL 20-0.9 MG/50ML-% IV SOLN
20.0000 mg | Freq: Once | INTRAVENOUS | Status: AC
  Administered 2019-12-03: 20 mg via INTRAVENOUS
  Filled 2019-12-03: qty 50

## 2019-12-03 MED ORDER — SODIUM CHLORIDE 0.9 % IV SOLN
500.0000 mg | Freq: Once | INTRAVENOUS | Status: AC
  Administered 2019-12-03: 500 mg via INTRAVENOUS
  Filled 2019-12-03: qty 50

## 2019-12-03 MED ORDER — SODIUM CHLORIDE 0.9 % IV SOLN
Freq: Once | INTRAVENOUS | Status: AC
  Filled 2019-12-03: qty 5

## 2019-12-03 MED ORDER — AZITHROMYCIN 250 MG PO TABS: ORAL_TABLET | ORAL | 0 refills | Status: DC

## 2019-12-03 MED ORDER — MORPHINE SULFATE 2 MG/ML IJ SOLN
4.0000 mg | Freq: Once | INTRAMUSCULAR | Status: AC
  Administered 2019-12-03: 11:00:00 4 mg via INTRAVENOUS
  Filled 2019-12-03: qty 2

## 2019-12-03 MED ORDER — SODIUM CHLORIDE 0.9 % IV SOLN
200.0000 mg/m2 | Freq: Once | INTRAVENOUS | Status: AC
  Administered 2019-12-03: 324 mg via INTRAVENOUS
  Filled 2019-12-03: qty 54

## 2019-12-03 MED ORDER — SODIUM CHLORIDE 0.9 % IV SOLN
Freq: Once | INTRAVENOUS | Status: AC
  Filled 2019-12-03: qty 250

## 2019-12-03 MED ORDER — DIPHENHYDRAMINE HCL 50 MG/ML IJ SOLN
50.0000 mg | Freq: Once | INTRAMUSCULAR | Status: AC
  Administered 2019-12-03: 50 mg via INTRAVENOUS
  Filled 2019-12-03: qty 1

## 2019-12-03 MED ORDER — PROCHLORPERAZINE EDISYLATE 10 MG/2ML IJ SOLN
10.0000 mg | Freq: Once | INTRAMUSCULAR | Status: AC
  Administered 2019-12-03: 10:00:00 10 mg via INTRAVENOUS
  Filled 2019-12-03: qty 2

## 2019-12-03 MED ORDER — OLANZAPINE 10 MG PO TABS: 10.0000 mg | ORAL_TABLET | Freq: Every day | ORAL | 1 refills | Status: DC

## 2019-12-03 MED ORDER — PALONOSETRON HCL INJECTION 0.25 MG/5ML
0.2500 mg | Freq: Once | INTRAVENOUS | Status: AC
  Administered 2019-12-03: 0.25 mg via INTRAVENOUS
  Filled 2019-12-03: qty 5

## 2019-12-03 MED ORDER — SODIUM CHLORIDE 0.9% FLUSH
10.0000 mL | INTRAVENOUS | Status: DC | PRN
  Administered 2019-12-03: 10 mL
  Filled 2019-12-03: qty 10

## 2019-12-03 MED ORDER — XTAMPZA ER 13.5 MG PO C12A: 1.0000 | EXTENDED_RELEASE_CAPSULE | Freq: Two times a day (BID) | ORAL | 0 refills | Status: DC

## 2019-12-03 MED ORDER — HEPARIN SOD (PORK) LOCK FLUSH 100 UNIT/ML IV SOLN
500.0000 [IU] | Freq: Once | INTRAVENOUS | Status: AC | PRN
  Administered 2019-12-03: 500 [IU]
  Filled 2019-12-03: qty 5

## 2019-12-03 NOTE — Progress Notes (Signed)
Patient stated that she had been nauseated and back pain since last Monday. She stated that for two days she was fine but that she started to feel bad again since Friday night. Patient declined fever, chills, vomiting, diarrhea or constipation.

## 2019-12-03 NOTE — Progress Notes (Signed)
[facility]  [phone] [phone]   Name: Deborah Nguyen Date: 12/03/2019 MRN: [id number]  DOB: June 20, 1961  Patient Care Team: Tracie Harrier, MD as PCP - General (Internal Medicine) Telford Nab, RN as Registered Nurse    REASON FOR CONSULTATION: Palliative Care consult requested for this 58 y.o. 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  PMH is also notable for muscular dystrophy followed by neuromuscular clinic at [facility], anxiety, history of hypothyroidism, recurrent hyperthermia, and history of lower extremity DVT.  Patient was diagnosed with stage IV lung cancer after having right flank pain over several months.  She ultimately underwent PET/CT on 09/20/2019 with findings of hypermetabolic mass in the right lower lobe, supraclavicular/mediastinal/right hilar lymph nodes, and bilateral adrenal glands.  Patient was also found to have several brain masses on MRI.  Patient is receiving whole brain radiation.  There has been discussion about systemic treatment.  Palliative care was consulted to help address goals and manage ongoing symptoms.  SOCIAL HISTORY:     reports that she quit smoking about 3 years ago. Her smoking use included cigarettes. She smoked 1.00 pack per day. She has never used smokeless tobacco. She reports that she does not drink alcohol or use drugs.   Patient is unmarried.  She lives at home in an apartment with her twin sister as her primary caregiver.  Patient has a son and two grandchildren.  Patient is disabled from her muscular dystrophy but worked in a [employer] earlier in life.  ADVANCE DIRECTIVES:  In process of establishing  CODE STATUS: DNR/DNI (MOST Form completed on 10/25/2019)  PAST MEDICAL HISTORY: Past Medical History:  Diagnosis Date  . Anxiety   . Arthritis   . Chronic kidney disease   . Family history of  breast cancer    aunt  . GERD (gastroesophageal reflux disease)   . History of DVT of lower extremity    Left leg  . Lung cancer ([place])    with brain mets  . Lung mass   . Malignant hyperthermia   . 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[place])    mild form - per patient central cord disease  . Muscular dystrophy ([place])     PAST SURGICAL HISTORY:  Past Surgical History:  Procedure Laterality Date  . CESAREAN SECTION  11/1985  . COLONOSCOPY N/A 10/27/2015   Procedure: COLONOSCOPY;  Surgeon: Manya Silvas, MD;  Location: [facility];  Service: Endoscopy;  Laterality: N/A;   NO Propofol - per office  . LITHOTRIPSY    . PORTA CATH INSERTION N/A 11/09/2019   Procedure: PORTA CATH INSERTION;  Surgeon: Algernon Huxley, MD;  Location: [facility];  Service: Cardiovascular;  Laterality: N/A;  . TUBAL LIGATION  03/1986    HEMATOLOGY/ONCOLOGY HISTORY:  Oncology History  Malignant neoplasm of lower lobe of right lung ([place])  09/20/2019 Initial Diagnosis   Malignant neoplasm of lower lobe of right lung ([place])   10/05/2019 Cancer Staging   Staging form: Lung, AJCC 8th Edition - Clinical stage from 10/05/2019: Stage IV (cT2, cN2, cM1) - Signed by Sindy Guadeloupe, MD on 10/08/2019   11/12/2019 -  Chemotherapy   The patient had palonosetron (ALOXI) injection 0.25 mg, 0.25 mg, Intravenous,  Once, 2 of 4 cycles Administration: 0.25 mg (11/12/2019) pegfilgrastim-cbqv (UDENYCA) injection 6 mg, 6 mg, Subcutaneous, Once, 2 of 4 cycles Administration: 6 mg (11/13/2019) CARBOplatin (PARAPLATIN) 500 mg  in sodium chloride 0.9 % 250 mL chemo infusion, 500 mg (100 % of original dose 500 mg), Intravenous,  Once, 2 of 4 cycles Dose modification:   (original dose 487.5 mg, Cycle 1), 500 mg (original dose 500 mg, Cycle 1, Reason: Patient Age, Comment: per md) Administration: 500 mg (11/12/2019) PACLitaxel (TAXOL) 324 mg in sodium chloride 0.9 % 500 mL chemo infusion (> 80mg /m2), 200 mg/m2 = 324 mg, Intravenous,  Once, 2  of 4 cycles Administration: 324 mg (11/12/2019) pembrolizumab (KEYTRUDA) 200 mg in sodium chloride 0.9 % 50 mL chemo infusion, 200 mg, Intravenous, Once, 1 of 1 cycle Administration: 200 mg (11/12/2019) fosaprepitant (EMEND) 150 mg, dexamethasone (DECADRON) 12 mg in sodium chloride 0.9 % 145 mL IVPB, , Intravenous,  Once, 2 of 4 cycles Administration:  (11/12/2019)  for chemotherapy treatment.      ALLERGIES:  is allergic to paxil [paroxetine hcl]; ciprofloxacin; levaquin [levofloxacin]; propofol; serzone [nefazodone]; biaxin [clarithromycin]; erythromycin; and penicillins.  MEDICATIONS:  Current Outpatient Medications  Medication Sig Dispense Refill  . acetaminophen (TYLENOL) 325 MG tablet Take 650 mg by mouth every 6 (six) hours as needed.    . ALPRAZolam (XANAX) 0.25 MG tablet Take 0.25 mg by mouth at bedtime as needed for anxiety.    Marland Kitchen apixaban (ELIQUIS) 5 MG TABS tablet Take 1 tablet by mouth 2 (two) times daily.    Marland Kitchen azithromycin (ZITHROMAX Z-PAK) 250 MG tablet 2 tablets day 1 and then 1 tablet daily until completed rx 6 each 0  . citalopram (CELEXA) 20 MG tablet Take 1 tablet (20 mg total) by mouth daily. 30 tablet 2  . dexamethasone (DECADRON) 4 MG tablet Take 2 tablets (8 mg total) by mouth daily. Start the day after carboplatin chemotherapy for 3 days. 30 tablet 1  . fluticasone (FLONASE) 50 MCG/ACT nasal spray Place 2 sprays into both nostrils daily as needed.     . lidocaine-prilocaine (EMLA) cream Apply to affected area once 30 g 3  . magic mouthwash SOLN Take 5 mLs by mouth 3 (three) times daily as needed for mouth pain. 300 mL 1  . methimazole (TAPAZOLE) 5 MG tablet Take 5 mg by mouth daily.    . metoprolol succinate (TOPROL-XL) 25 MG 24 hr tablet Take 12.5 tablets by mouth daily.    Marland Kitchen nystatin (MYCOSTATIN) 100000 UNIT/ML suspension Take 5 mLs (500,000 Units total) by mouth 4 (four) times daily. Swish and spit. 300 mL 0  . OLANZapine (ZYPREXA) 10 MG tablet Take 1 tablet (10 mg  total) by mouth at bedtime. 30 tablet 1  . ondansetron (ZOFRAN) 8 MG tablet Take 1 tablet (8 mg total) by mouth 2 (two) times daily as needed for refractory nausea / vomiting. Start on day 3 after carboplatin chemo. 30 tablet 1  . ondansetron (ZOFRAN-ODT) 4 MG disintegrating tablet Take 1 tablet (4 mg total) by mouth every 8 (eight) hours as needed for nausea or vomiting. 30 tablet 2  . oxyCODONE ER (XTAMPZA ER) 13.5 MG C12A Take 1 tablet by mouth every 12 (twelve) hours. 60 capsule 0  . Oxycodone HCl 10 MG TABS Take 1 tablet (10 mg total) by mouth every 4 (four) hours as needed. 60 tablet 0  . pantoprazole (PROTONIX) 20 MG tablet Take 1 tablet (20 mg total) by mouth 2 (two) times daily. 120 tablet 0  . predniSONE (DELTASONE) 10 MG tablet Take 1 tablet (10 mg total) by mouth as directed. 5 tablets daily  X 2 days, 4 tablet daily x 2  days, 3 tablet x 2 days, 2 tablet x2 days and then 1 tablet x 2 days with food every day 30 tablet 0  . prochlorperazine (COMPAZINE) 10 MG tablet Take 0.5-1 tablets (5-10 mg total) by mouth every 6 (six) hours as needed for nausea or vomiting. 30 tablet 2  . prochlorperazine (COMPAZINE) 10 MG tablet Take 1 tablet (10 mg total) by mouth every 6 (six) hours as needed (Nausea or vomiting). 30 tablet 1  . sucralfate (CARAFATE) 1 g tablet Take 1 tablet (1 g total) by mouth 3 (three) times daily. Dissolve in 3-4 tbsp warm water, swish and swallow. 90 tablet 3  . vitamin B-12 (CYANOCOBALAMIN) 1000 MCG tablet Take 1 tablet (1,000 mcg total) by mouth daily. 30 tablet 1   No current facility-administered medications for this visit.   Facility-Administered Medications Ordered in Other Visits  Medication Dose Route Frequency Provider Last Rate Last Admin  . 0.9 %  sodium chloride infusion   Intravenous Once Sindy Guadeloupe, MD 999 mL/hr at 12/03/19 1026 New Bag at 12/03/19 1026  . CARBOplatin (PARAPLATIN) 500 mg in sodium chloride 0.9 % 250 mL chemo infusion  500 mg Intravenous Once  Sindy Guadeloupe, MD      . Margrett Rud diphenhydrAMINE (BENADRYL) injection 50 mg  50 mg Intravenous Once Sindy Guadeloupe, MD   50 mg at 12/03/19 1110  . famotidine (PEPCID) IVPB 20 mg premix  20 mg Intravenous Once Sindy Guadeloupe, MD      . fosaprepitant (EMEND) 150 mg, dexamethasone (DECADRON) 12 mg in sodium chloride 0.9 % 145 mL IVPB   Intravenous Once Sindy Guadeloupe, MD      . heparin lock flush 100 unit/mL  500 Units Intracatheter Once PRN Sindy Guadeloupe, MD      . PACLitaxel (TAXOL) 324 mg in sodium chloride 0.9 % 500 mL chemo infusion (> 80mg /m2)  200 mg/m2 (Treatment Plan Recorded) Intravenous Once Sindy Guadeloupe, MD      . sodium chloride flush (NS) 0.9 % injection 10 mL  10 mL Intracatheter PRN Sindy Guadeloupe, MD   10 mL at 12/03/19 0919    VITAL SIGNS: LMP 12/15/2010 (Approximate)  There were no vitals filed for this visit.  Estimated body mass index is 25.7 kg/m as calculated from the following:   Height as of 11/09/19: 5\' 1"  (1.549 m).   Weight as of an earlier encounter on 12/03/19: 136 lb (61.7 kg).  LABS: CBC:    Component Value Date/Time   WBC 12.2 (H) 12/03/2019 0904   HGB 10.3 (L) 12/03/2019 0904   HGB 13.5 11/07/2013 2128   HCT 34.2 (L) 12/03/2019 0904   HCT 39.2 11/07/2013 2128   PLT 623 (H) 12/03/2019 0904   PLT 282 11/07/2013 2128   MCV 96.1 12/03/2019 0904   MCV 96 11/07/2013 2128   NEUTROABS 9.2 (H) 12/03/2019 0904   NEUTROABS 6.8 (H) 05/24/2013 1340   LYMPHSABS 1.0 12/03/2019 0904   LYMPHSABS 1.6 05/24/2013 1340   MONOABS 1.3 (H) 12/03/2019 0904   MONOABS 0.6 05/24/2013 1340   EOSABS 0.1 12/03/2019 0904   EOSABS 0.0 05/24/2013 1340   BASOSABS 0.2 (H) 12/03/2019 0904   BASOSABS 0.0 05/24/2013 1340   Comprehensive Metabolic Panel:    Component Value Date/Time   NA 138 12/03/2019 0904   NA 140 11/07/2013 2128   K 4.0 12/03/2019 0904   K 4.2 11/07/2013 2128   CL 100 12/03/2019 0904   CL 107 11/07/2013 2128  CO2 28 12/03/2019 0904   CO2 29  11/07/2013 2128   BUN 16 12/03/2019 0904   BUN 21 (H) 11/07/2013 2128   CREATININE 0.35 (L) 12/03/2019 0904   CREATININE 0.50 (L) 11/07/2013 2128   GLUCOSE 109 (H) 12/03/2019 0904   GLUCOSE 107 (H) 11/07/2013 2128   CALCIUM 9.0 12/03/2019 0904   CALCIUM 9.3 11/07/2013 2128   AST 19 12/03/2019 0904   AST 48 (H) 11/07/2013 2128   ALT 27 12/03/2019 0904   ALT 60 11/07/2013 2128   ALKPHOS 71 12/03/2019 0904   ALKPHOS 92 11/07/2013 2128   BILITOT 0.4 12/03/2019 0904   BILITOT 0.2 11/07/2013 2128   PROT 6.9 12/03/2019 0904   PROT 7.5 11/07/2013 2128   ALBUMIN 3.1 (L) 12/03/2019 0904   ALBUMIN 3.6 11/07/2013 2128    RADIOGRAPHIC STUDIES: PERIPHERAL VASCULAR CATHETERIZATION  Result Date: 11/09/2019 See op note  US Venous Img Lower Unilateral Left  Result Date: 11/08/2019 CLINICAL DATA:  58 year old female with left lower extremity swelling for 1 week EXAM: LEFT LOWER EXTREMITY VENOUS DOPPLER ULTRASOUND TECHNIQUE: Gray-scale sonography with graded compression, as well as color Doppler and duplex ultrasound were performed to evaluate the lower extremity deep venous systems from the level of the common femoral vein and including the common femoral, femoral, profunda femoral, popliteal and calf veins including the posterior tibial, peroneal and gastrocnemius veins when visible. The superficial great saphenous vein was also interrogated. Spectral Doppler was utilized to evaluate flow at rest and with distal augmentation maneuvers in the common femoral, femoral and popliteal veins. COMPARISON:  None. FINDINGS: Contralateral Common Femoral Vein: Respiratory phasicity is normal and symmetric with the symptomatic side. No evidence of thrombus. Normal compressibility. Common Femoral Vein: No evidence of thrombus. Normal compressibility, respiratory phasicity and response to augmentation. Saphenofemoral Junction: No evidence of thrombus. Normal compressibility and flow on color Doppler imaging. Profunda  Femoral Vein: No evidence of thrombus. Normal compressibility and flow on color Doppler imaging. Femoral Vein: No evidence of thrombus. Normal compressibility, respiratory phasicity and response to augmentation. Popliteal Vein: No evidence of thrombus. Normal compressibility, respiratory phasicity and response to augmentation. Calf Veins: No evidence of thrombus. Normal compressibility and flow on color Doppler imaging. Superficial Great Saphenous Vein: No evidence of thrombus. Normal compressibility. Venous Reflux:  None. Other Findings:  None. IMPRESSION: No evidence of deep venous thrombosis. Electronically Signed   By: Jacqulynn Cadet M.D.   On: 11/08/2019 13:54    PERFORMANCE STATUS (ECOG) : 2 - Symptomatic, <50% confined to bed  Review of Systems Unless otherwise noted, a complete review of systems is negative.  Physical Exam General: NAD, frail appearing, thin Pulmonary: unlabored Skin: no rashes Neurological: Weakness but otherwise nonfocal  IMPRESSION: Routine follow-up visit made today.  Patient was seen in the infusion area.  Patient says that she has had a "rough week."  Seen in [facility] on 12/23 with nausea and vomiting.  Patient has had persistent nausea, unrelieved with scheduled antiemetics.  Dr. Janese Banks is rotating her to olanzapine today.  Patient says that her oral intake has been minimal.  However, weight is stable at 136 pounds.  Patient does have intermittent pain for which she takes oxycodone as needed.  No other significant changes reported.   PLAN: -Continue current scope of treatment -Continue oxycodone 10 mg every 4 hours PRN -Continue citalopram to 20 mg daily -Agree with olanzapine 10 mg nightly -Follow-up telephone visit in 3 to 4 weeks   Patient expressed understanding and was in  agreement with this plan. She also understands that She can call the clinic at any time with any questions, concerns, or complaints.     Time Total: 15 minutes  Visit  consisted of counseling and education dealing with the complex and emotionally intense issues of symptom management and palliative care in the setting of serious and potentially life-threatening illness.Greater than 50%  of this time was spent counseling and coordinating care related to the above assessment and plan.  Signed by: Altha Harm, PhD, NP-C

## 2019-12-03 NOTE — Telephone Encounter (Signed)
Re:  Temperature  Called by patient's sister regarding fever 99.5 and sinus congestion.  Patient coughing.  Patient continues to be nauseated.  No chills.  No increased shortness of breath.  Patient wishes to wait until morning to be evaluated by Dr Janese Banks.    Discussed ongoing monitoring and symptom management.    She is to call back if she has temperature >= 100.4, chills or not doing well.  Per patient's sister, she has an appointment at 9:30 AM for her next cycle of chemotherapy.  Counts were excellent on 11/28/2019.   Lequita Asal, MD

## 2019-12-04 ENCOUNTER — Inpatient Hospital Stay: Payer: Medicare Other

## 2019-12-04 ENCOUNTER — Telehealth: Payer: Self-pay | Admitting: *Deleted

## 2019-12-04 DIAGNOSIS — C3431 Malignant neoplasm of lower lobe, right bronchus or lung: Secondary | ICD-10-CM

## 2019-12-04 DIAGNOSIS — Z5111 Encounter for antineoplastic chemotherapy: Secondary | ICD-10-CM | POA: Diagnosis not present

## 2019-12-04 LAB — T4: T4, Total: 6.5 ug/dL (ref 4.5–12.0)

## 2019-12-04 MED ORDER — PEGFILGRASTIM-CBQV 6 MG/0.6ML ~~LOC~~ SOSY
6.0000 mg | PREFILLED_SYRINGE | Freq: Once | SUBCUTANEOUS | Status: AC
  Administered 2019-12-04: 6 mg via SUBCUTANEOUS
  Filled 2019-12-04: qty 0.6

## 2019-12-04 NOTE — Telephone Encounter (Signed)
Sister [name] called asking that we order Adult bed rails for this patient. Please send order if in agreement

## 2019-12-04 NOTE — Telephone Encounter (Signed)
Ok to send the order

## 2019-12-04 NOTE — Progress Notes (Signed)
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 She presented to Dr. Wilburn Cornelia symptoms of right flank pain which was radiating to her front which prompted a CT abdomen. CT abdomen pelvis with contrast showed a 3.7 x 2.8 x 5 cm right adrenal mass. 1.7 x 1.2 x 2.1 cm left adrenal nodule 1.8 cm right renal simple cyst. And a new 3.6 x 3.2 cm spiculated mass in the right lower lobe with tethering of the adjacent pleura. Prominent hilar lymph nodes. This was followed by a PET CT scan on 09/20/2019 which showed a 3.6 cm right lower lobe mass which was markedly hypermetabolic along with hypermetabolic metastatic disease in the right supraclavicular space as well as mediastinum and right hilum and hypermetabolic metastatic disease in bilateral adrenal glands. She will also noted to have bilateral hypermetabolic thyroid nodules and a possible nodule in the pelvis adjacent to the sigmoid colon.  Patient currently reports ongoing pain in her right flank which is relatively well controlled with hydrocodone. She also has occasional nausea for which she has as needed nausea medications. She is here with her twin sister today and is highly anxious Of note patient has a history of central core myopathy and follows up with neuromuscular clinic at [facility]. Patient states that her twin sister has had episodes of hyperthermia but patient herself has not  had these episodes. I did review [facility] neuromuscular clinic note and the hyperthermia has been attributed to her history of hyperthyroidism and thyroid dysfunction rather than malignant hyperthermia. However given her history of myopathy patient does carry risk of malignant hyperthermia.  Not enough tissue was available for NGS testing. Patient therefore underwentLiquid biopsy which showed evidence of PI K3 CA amplification, RIC POR amplification, T p53   Interval history- 1.  Patient reports ongoing nausea which is not well controlled despite using Zofran and Compazine.  Denies any vomiting but nausea has made it difficult for her to keep up with her oral intake over the last couple of days 2.  Reports pain all over but mainly in her back 3.  She had a temperature of 99.5 yesterday and took a Tylenol for it.  No other subsequent temperature spikes.  Denies any cough or shortness of breath 4.  Skin rash is improved after she started taking prednisone 5.  Patient also reports headache nasal congestion  ECOG PS- 1 Pain scale- 4 Opioid associated constipation- no  Review of systems- Review of Systems  Constitutional: Positive for malaise/fatigue. Negative for chills, fever and weight loss.  HENT: Positive for congestion. Negative for ear discharge and nosebleeds.   Eyes: Negative for blurred vision.  Respiratory: Negative for cough, hemoptysis, sputum production, shortness of breath and wheezing.   Cardiovascular: Negative for chest pain, palpitations, orthopnea and claudication.  Gastrointestinal: Positive for nausea. Negative for abdominal pain, blood in stool, constipation, diarrhea, heartburn, melena and vomiting.  Genitourinary: Negative for dysuria, flank pain, frequency, hematuria and urgency.  Musculoskeletal: Positive for back pain.  Negative for joint pain and myalgias.  Skin: Negative for rash.  Neurological: Negative for dizziness, tingling, focal weakness, seizures, weakness and  headaches.  Endo/Heme/Allergies: Does not bruise/bleed easily.  Psychiatric/Behavioral: Negative for depression and suicidal ideas. The patient does not have insomnia.       Allergies  Allergen Reactions  . Paxil [Paroxetine Hcl] Other (See Comments)    Hallucinations   . Ciprofloxacin Other (See Comments)    Unknown  . Levaquin [Levofloxacin] Other (See Comments)    Unknown   . 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 She has an increase risk of malignant hyperthermia with anesthesia. She should not receive Propofol.   Donne Hazel [Nefazodone] Other (See Comments)    Unknown   . Biaxin [Clarithromycin] Nausea Only  . Erythromycin Nausea Only  . Penicillins Rash     Past Medical History:  Diagnosis Date  . Anxiety   . Arthritis   . Chronic kidney disease   . Family history of breast cancer    aunt  . GERD (gastroesophageal reflux disease)   . History of DVT of lower extremity    Left leg  . Lung cancer ([place])    with brain mets  . Lung mass   . Malignant hyperthermia   . 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 Muscular dystrophy [facility])      Past Surgical History:  Procedure Laterality Date  . CESAREAN SECTION  11/1985  . COLONOSCOPY N/A 10/27/2015   Procedure: COLONOSCOPY;  Surgeon: Manya Silvas, MD;  Location: [facility] ENDOSCOPY;  Service: Endoscopy;  Laterality: N/A;   NO Propofol - per office  . LITHOTRIPSY    . PORTA CATH INSERTION N/A 11/09/2019   Procedure: PORTA CATH INSERTION;  Surgeon: Algernon Huxley, MD;  Location: [facility] CV LAB;  Service: Cardiovascular;  Laterality: N/A;  . TUBAL LIGATION  03/1986    Social History   Socioeconomic History  . Marital status: Divorced    Spouse name: Not on file  . Number of children: Not on file  . Years of education: Not on file  . Highest education level: Not on file  Occupational History  . Not on file    Tobacco Use  . Smoking status: Former Smoker    Packs/day: 1.00    Types: Cigarettes    Quit date: 05/2016    Years since quitting: 3.5  . Smokeless tobacco: Never Used  Substance and Sexual Activity  . Alcohol use: No  . Drug use: No  . Sexual activity: Not on file  Other Topics Concern  . Not on file  Social History Narrative  . Not on file   Social Determinants of Health   Financial Resource Strain:   . Difficulty of Paying Living Expenses: Not on file  Food Insecurity:   . Worried About Charity fundraiser in the Last Year: Not on file  . Ran Out of Food in the Last Year: Not on file  Transportation Needs:   . Lack of Transportation (Medical): Not on file  . Lack of Transportation (Non-Medical): Not on file  Physical Activity:   . Days of Exercise per Week: Not on file  . Minutes of Exercise per Session: Not on file  Stress:   . Feeling of Stress : Not on file  Social Connections:   . Frequency of Communication with Friends  and Family: Not on file  . Frequency of Social Gatherings with Friends and Family: Not on file  . Attends Religious Services: Not on file  . Active Member of Clubs or Organizations: Not on file  . Attends Archivist Meetings: Not on file  . Marital Status: Not on file  Intimate Partner Violence:   . Fear of Current or Ex-Partner: Not on file  . Emotionally Abused: Not on file  . Physically Abused: Not on file  . Sexually Abused: Not on file    Family History  Problem Relation Age of Onset  . Cancer Father        bladder, lung  . Breast cancer Maternal Aunt 45     Current Outpatient Medications:  .  acetaminophen (TYLENOL) 325 MG tablet, Take 650 mg by mouth every 6 (six) hours as needed., Disp: , Rfl:  .  ALPRAZolam (XANAX) 0.25 MG tablet, Take 0.25 mg by mouth at bedtime as needed for anxiety., Disp: , Rfl:  .  apixaban (ELIQUIS) 5 MG TABS tablet, Take 1 tablet by mouth 2 (two) times daily., Disp: , Rfl:  .  citalopram  (CELEXA) 20 MG tablet, Take 1 tablet (20 mg total) by mouth daily., Disp: 30 tablet, Rfl: 2 .  dexamethasone (DECADRON) 4 MG tablet, Take 2 tablets (8 mg total) by mouth daily. Start the day after carboplatin chemotherapy for 3 days., Disp: 30 tablet, Rfl: 1 .  fluticasone (FLONASE) 50 MCG/ACT nasal spray, Place 2 sprays into both nostrils daily as needed. , Disp: , Rfl:  .  lidocaine-prilocaine (EMLA) cream, Apply to affected area once, Disp: 30 g, Rfl: 3 .  magic mouthwash SOLN, Take 5 mLs by mouth 3 (three) times daily as needed for mouth pain., Disp: 300 mL, Rfl: 1 .  methimazole (TAPAZOLE) 5 MG tablet, Take 5 mg by mouth daily., Disp: , Rfl:  .  metoprolol succinate (TOPROL-XL) 25 MG 24 hr tablet, Take 12.5 tablets by mouth daily., Disp: , Rfl:  .  nystatin (MYCOSTATIN) 100000 UNIT/ML suspension, Take 5 mLs (500,000 Units total) by mouth 4 (four) times daily. Swish and spit., Disp: 300 mL, Rfl: 0 .  ondansetron (ZOFRAN) 8 MG tablet, Take 1 tablet (8 mg total) by mouth 2 (two) times daily as needed for refractory nausea / vomiting. Start on day 3 after carboplatin chemo., Disp: 30 tablet, Rfl: 1 .  ondansetron (ZOFRAN-ODT) 4 MG disintegrating tablet, Take 1 tablet (4 mg total) by mouth every 8 (eight) hours as needed for nausea or vomiting., Disp: 30 tablet, Rfl: 2 .  Oxycodone HCl 10 MG TABS, Take 1 tablet (10 mg total) by mouth every 4 (four) hours as needed., Disp: 60 tablet, Rfl: 0 .  pantoprazole (PROTONIX) 20 MG tablet, Take 1 tablet (20 mg total) by mouth 2 (two) times daily., Disp: 120 tablet, Rfl: 0 .  predniSONE (DELTASONE) 10 MG tablet, Take 1 tablet (10 mg total) by mouth as directed. 5 tablets daily  X 2 days, 4 tablet daily x 2 days, 3 tablet x 2 days, 2 tablet x2 days and then 1 tablet x 2 days with food every day, Disp: 30 tablet, Rfl: 0 .  prochlorperazine (COMPAZINE) 10 MG tablet, Take 0.5-1 tablets (5-10 mg total) by mouth every 6 (six) hours as needed for nausea or vomiting.,  Disp: 30 tablet, Rfl: 2 .  prochlorperazine (COMPAZINE) 10 MG tablet, Take 1 tablet (10 mg total) by mouth every 6 (six) hours as needed (Nausea or vomiting).,  Disp: 30 tablet, Rfl: 1 .  sucralfate (CARAFATE) 1 g tablet, Take 1 tablet (1 g total) by mouth 3 (three) times daily. Dissolve in 3-4 tbsp warm water, swish and swallow., Disp: 90 tablet, Rfl: 3 .  vitamin B-12 (CYANOCOBALAMIN) 1000 MCG tablet, Take 1 tablet (1,000 mcg total) by mouth daily., Disp: 30 tablet, Rfl: 1 .  azithromycin (ZITHROMAX Z-PAK) 250 MG tablet, 2 tablets day 1 and then 1 tablet daily until completed rx, Disp: 6 each, Rfl: 0 .  OLANZapine (ZYPREXA) 10 MG tablet, Take 1 tablet (10 mg total) by mouth at bedtime., Disp: 30 tablet, Rfl: 1 .  oxyCODONE ER (XTAMPZA ER) 13.5 MG C12A, Take 1 tablet by mouth every 12 (twelve) hours., Disp: 60 capsule, Rfl: 0  Physical exam:  Vitals:   12/03/19 0930  BP: 127/82  Pulse: 87  Temp: 98.7 F (37.1 C)  TempSrc: Tympanic  Weight: 136 lb (61.7 kg)   Physical Exam Constitutional:      Comments: Appears fatigued  HENT:     Head: Normocephalic and atraumatic.     Comments: B/l frontal sinus tenderness Eyes:     Pupils: Pupils are equal, round, and reactive to light.  Cardiovascular:     Rate and Rhythm: Normal rate and regular rhythm.     Heart sounds: Normal heart sounds.  Pulmonary:     Effort: Pulmonary effort is normal.     Breath sounds: Normal breath sounds.  Abdominal:     General: Bowel sounds are normal.     Palpations: Abdomen is soft.  Musculoskeletal:     Cervical back: Normal range of motion.  Skin:    General: Skin is warm and dry.  Neurological:     Mental Status: She is alert and oriented to person, place, and time.      CMP Latest Ref Rng & Units 12/03/2019  Glucose 70 - 99 mg/dL 109(H)  BUN 6 - 20 mg/dL 16  Creatinine 0.44 - 1.00 mg/dL 0.35(L)  Sodium 135 - 145 mmol/L 138  Potassium 3.5 - 5.1 mmol/L 4.0  Chloride 98 - 111 mmol/L 100  CO2 22 -  32 mmol/L 28  Calcium 8.9 - 10.3 mg/dL 9.0  Total Protein 6.5 - 8.1 g/dL 6.9  Total Bilirubin 0.3 - 1.2 mg/dL 0.4  Alkaline Phos 38 - 126 U/L 71  AST 15 - 41 U/L 19  ALT 0 - 44 U/L 27   CBC Latest Ref Rng & Units 12/03/2019  WBC 4.0 - 10.5 K/uL 12.2(H)  Hemoglobin 12.0 - 15.0 g/dL 10.3(L)  Hematocrit 36.0 - 46.0 % 34.2(L)  Platelets 150 - 400 K/uL 623(H)    No images are attached to the encounter.  PERIPHERAL VASCULAR CATHETERIZATION  Result Date: 11/09/2019 See op note  US Venous Img Lower Unilateral Left  Result Date: 11/08/2019 CLINICAL DATA:  58 year old female with left lower extremity swelling for 1 week EXAM: LEFT LOWER EXTREMITY VENOUS DOPPLER ULTRASOUND TECHNIQUE: Gray-scale sonography with graded compression, as well as color Doppler and duplex ultrasound were performed to evaluate the lower extremity deep venous systems from the level of the common femoral vein and including the common femoral, femoral, profunda femoral, popliteal and calf veins including the posterior tibial, peroneal and gastrocnemius veins when visible. The superficial great saphenous vein was also interrogated. Spectral Doppler was utilized to evaluate flow at rest and with distal augmentation maneuvers in the common femoral, femoral and popliteal veins. COMPARISON:  None. FINDINGS: Contralateral Common Femoral Vein: Respiratory phasicity is  normal and symmetric with the symptomatic side. No evidence of thrombus. Normal compressibility. Common Femoral Vein: No evidence of thrombus. Normal compressibility, respiratory phasicity and response to augmentation. Saphenofemoral Junction: No evidence of thrombus. Normal compressibility and flow on color Doppler imaging. Profunda Femoral Vein: No evidence of thrombus. Normal compressibility and flow on color Doppler imaging. Femoral Vein: No evidence of thrombus. Normal compressibility, respiratory phasicity and response to augmentation. Popliteal Vein: No evidence of  thrombus. Normal compressibility, respiratory phasicity and response to augmentation. Calf Veins: No evidence of thrombus. Normal compressibility and flow on color Doppler imaging. Superficial Great Saphenous Vein: No evidence of thrombus. Normal compressibility. Venous Reflux:  None. Other Findings:  None. IMPRESSION: No evidence of deep venous thrombosis. Electronically Signed   By: Jacqulynn Cadet M.D.   On: 11/08/2019 13:54     Assessment and plan- Patient is a 58 y.o. female  withsquamous cell carcinoma of the lung stage IV [id number] with adrenal and brain metastases.    She is here for on treatment assessment prior to cycle 2 of carbotaxol chemotherapy  Patient had a significant rash after cycle 1 of carbotaxol Keytruda which required 10 days of prednisone and it appears to be slowly healing at this time.  It is unclear if the rash was secondary to [facility] or Taxol.  I plan to hold Keytruda today and only give her carbotaxol.  If she still develops a rash this time, I will represcribe prednisone and switch her from Taxol to Abraxane for cycle 3 and reintroduce Keytruda  Nausea: I will add 10 mg of IV Compazine to her premeds today.  We will also send her a prescription for Zyprexa 10 mg nightly  Back pain: She will continue oxycodone 10 mg every 4 hours as needed I will add Xtampza ER which would be approved by her insurance 13.5 mg twice daily.  Opioid-induced constipation: Currently well controlled with bowel meds.  Headache and nasal congestion: I will give her a course of Z-Pak today.  Patient states that she has taken Z-Pak in the past and tolerated it well  1 L of IV fluids today.  CT chest abdomen and pelvis with contrast in 10 days to assess interim response to treatment  I will see her back in 3 weeks with CBC with differential, CMP for cycle 3 of carbotaxol chemotherapy plus minus Keytruda   Visit Diagnosis 1. Malignant neoplasm of lower lobe of right lung ([place])   2.  Brain metastases ([place])   3. 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 Neoplasm related pain   5. Chemotherapy-induced nausea      Dr. Randa Evens, MD, MPH [facility] at [facility] [phone] 12/04/2019 8:15 AM

## 2019-12-05 ENCOUNTER — Telehealth: Payer: Self-pay | Admitting: *Deleted

## 2019-12-05 ENCOUNTER — Other Ambulatory Visit: Payer: Self-pay | Admitting: Oncology

## 2019-12-05 DIAGNOSIS — Z86718 Personal history of other venous thrombosis and embolism: Secondary | ICD-10-CM

## 2019-12-05 MED ORDER — APIXABAN 5 MG PO TABS: 5.0000 mg | ORAL_TABLET | Freq: Two times a day (BID) | ORAL | 2 refills | Status: DC

## 2019-12-05 NOTE — Telephone Encounter (Signed)
Pharmacy called stating that they received a second prescription for an Eliquis starter pack, but the patient got this last month and they request a new prescription be sent for the twice a day dosing

## 2019-12-05 NOTE — Telephone Encounter (Signed)
New prescription was sent to patient's pharmacy.

## 2019-12-05 NOTE — Telephone Encounter (Signed)
I spoke with sister regarding a call from her with questions regarding her pain medicine and answered her questions, then she told me that not only does she need adult bed rails, she also needs a tub bench to help her get in the tub, NOT a shower seat

## 2019-12-05 NOTE — Telephone Encounter (Signed)
I called the sister and [facility] can get her the bed rails and the trf  bench. Ths bed rails are not covered by insurance and the cost is 129.99. the trf bench could be covered by her medicaid. Sister has given me permission to send order and her demographics with insurance for [facility] to see if it is covered. I have told the sister the address and phone number of [facility] and told her they are only open 1/2 day tom. And off Friday. She understands

## 2019-12-06 ENCOUNTER — Telehealth: Payer: Self-pay | Admitting: *Deleted

## 2019-12-06 ENCOUNTER — Other Ambulatory Visit: Payer: Self-pay | Admitting: *Deleted

## 2019-12-06 ENCOUNTER — Telehealth: Payer: Self-pay

## 2019-12-06 MED ORDER — FENTANYL 12 MCG/HR TD PT72: 1.0000 | MEDICATED_PATCH | TRANSDERMAL | 0 refills | Status: DC

## 2019-12-06 NOTE — Telephone Encounter (Signed)
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  Patient followed by Dr. Janese Banks and Palliative care.    Received voicemail from sister, Deborah Nguyen requesting more ensure and ensure rapid hydration samples.  Returned Deborah Nguyen's call this am. Deborah Nguyen reports that patient is having a hard time with pain and mouth sores/pain.  Reports that they are using magic mouthwash but still having issues with pain.  Patient has been reluctant to take pain medications but sister was able to give her oxycodone ER today.  Sister does report some nausea but better, taking zofran has not started olanzapine due to patient fear of new medications.  Sister reports patient ate scrambled egg in flour tortilla this am (most of it) and 1/2 of banana.  Sister has been getting her popsciles for her mouth pain, yogurt, oatmeal, bananas.      Medications: reviewed  Labs: reviewed  Anthropometrics:   Weight 136 lb on 12/28 increased from 11/12/2019 of 133 lb.     NUTRITION DIAGNOSIS:  Inadequate oral intake decreased with mouth pain   INTERVENTION:  Encouraged sister to call Dr. Elroy Channel team if pain and symptoms not relieved with prescribed medications.  Discussed strategies to help with sore mouth.  Handout printed and will leave for pick up by patient next week.  Will leave another case of ensure enlive for patient to pick up next week (seeing Dr. Baruch Gouty on 1/6) at registration and samples of ensure rapid hydration.  Discussed with Deborah Nguyen, sister.    Patient has contact information  MONITORING, EVALUATION, GOAL: Patient will consume adequate calories and protein to maintain lean muscle mass.   NEXT VISIT: Jan 28th (Thursday) phone f/u  Deborah Nguyen B. Zenia Resides, [place], [place] Registered Dietitian [phone] (pager)

## 2019-12-06 NOTE — Telephone Encounter (Signed)
Sister called. Patient is in severe pain worse today, I can hear her moaning in background. She states it is on her left side now as well as all over. She started the Oxycontin yesterday and did not take Oxycodone IR as the pharmacy told them not to use both. I advised that she take her Oxy IR now and that I would contact physician for further instructions. Please advise

## 2019-12-06 NOTE — Telephone Encounter (Signed)
Called and spoke to pt and she states that she has taken oxycontin x 2 and it has gave her pulling effect in her legs. She states that oxycodone helps her pain and it was severe this am and thinks the oxycontin is causing her more pain. I told her that I would call dr Janese Banks and let her know what she says. Dr Janese Banks says for her to try duragesic patch. When I called back the pt wanted her sister to get on the phone and said she only had oxycontin 1 time. The pt. Did say she had had worse pain but she feels better now for pain but is not pain free. I told sister and pt that we are trying duragesic patch and you put it on every 72 hours and change to new one. In the beginning it takes about 17-19 hours to kick in and I would advise the pt to still take 1 more oxycontin tonight and continue the oxycodone for breakthrough pain. Starting tomorrow just have duragesic patch and the break through oxycodone every 4 hours as needed.  The pt also has white patch in th back her throat she says, she feels like she has blisters and when she had this before the MMW and nystatin did not work. Doxepin solution helped and so I have faxed a rx of that to [facility]. I told them that if they have any more issue that they are welcome to call MD on call but if it is something they can fix on the phone then great but realistically they will probably have to go to ER for the issues they may have. Sister understands. Also I told sister to make sure patch goes on meaty area with the most fat and rotate spots. Do not put where she is bony because the patch medicine will not be absorbed. She understands

## 2019-12-10 ENCOUNTER — Telehealth: Payer: Self-pay | Admitting: *Deleted

## 2019-12-10 NOTE — Telephone Encounter (Signed)
Patient sister called reporting that patient is not doing well and would like to be called back. Patient is having pain and numbness in her feet and hands, is unable to walk and cannot get herself up from bed/ chair. Please return her call [phone] or advise if she needs appointment with [facility]

## 2019-12-10 NOTE — Telephone Encounter (Signed)
Called the sister and she states that she spoke to the pt and  she said that pt having hard time with pain. Hand and feet are bad, other body parts  are hurting also, she feels that she can't get out of bed to get pain meds. I aske sister if she could give her pain meds. She states that she has to have food first. I asked her if she could prop her up on pillows and give her juice and snack to take pain med. She states that it is getting harder to help her sister because of the MD.  I told her I will call rao and call her back and we added her sister on for tom. With Josh and poss fluids or pain meds if needed. Sister agreeable to this and she will be here 10 am

## 2019-12-11 ENCOUNTER — Inpatient Hospital Stay: Payer: Medicare Other | Attending: Hospice and Palliative Medicine | Admitting: Hospice and Palliative Medicine

## 2019-12-11 ENCOUNTER — Other Ambulatory Visit: Payer: Self-pay

## 2019-12-11 ENCOUNTER — Inpatient Hospital Stay: Payer: Medicare Other

## 2019-12-11 ENCOUNTER — Other Ambulatory Visit: Payer: Self-pay | Admitting: *Deleted

## 2019-12-11 VITALS — BP 134/79 | HR 79 | Temp 98.2°F | Resp 18 | Wt 134.0 lb

## 2019-12-11 DIAGNOSIS — C7972 Secondary malignant neoplasm of left adrenal gland: Secondary | ICD-10-CM | POA: Diagnosis not present

## 2019-12-11 DIAGNOSIS — C3431 Malignant neoplasm of lower lobe, right bronchus or lung: Secondary | ICD-10-CM

## 2019-12-11 DIAGNOSIS — J029 Acute pharyngitis, unspecified: Secondary | ICD-10-CM | POA: Diagnosis not present

## 2019-12-11 DIAGNOSIS — Z66 Do not resuscitate: Secondary | ICD-10-CM | POA: Diagnosis not present

## 2019-12-11 DIAGNOSIS — C781 Secondary malignant neoplasm of mediastinum: Secondary | ICD-10-CM | POA: Insufficient documentation

## 2019-12-11 DIAGNOSIS — Z86718 Personal history of other venous thrombosis and embolism: Secondary | ICD-10-CM | POA: Insufficient documentation

## 2019-12-11 DIAGNOSIS — R112 Nausea with vomiting, unspecified: Secondary | ICD-10-CM | POA: Diagnosis not present

## 2019-12-11 DIAGNOSIS — M79605 Pain in left leg: Secondary | ICD-10-CM | POA: Diagnosis not present

## 2019-12-11 DIAGNOSIS — K5900 Constipation, unspecified: Secondary | ICD-10-CM | POA: Insufficient documentation

## 2019-12-11 DIAGNOSIS — K219 Gastro-esophageal reflux disease without esophagitis: Secondary | ICD-10-CM | POA: Insufficient documentation

## 2019-12-11 DIAGNOSIS — R531 Weakness: Secondary | ICD-10-CM

## 2019-12-11 DIAGNOSIS — Z5111 Encounter for antineoplastic chemotherapy: Secondary | ICD-10-CM | POA: Diagnosis not present

## 2019-12-11 DIAGNOSIS — G893 Neoplasm related pain (acute) (chronic): Secondary | ICD-10-CM | POA: Diagnosis not present

## 2019-12-11 DIAGNOSIS — F329 Major depressive disorder, single episode, unspecified: Secondary | ICD-10-CM | POA: Diagnosis not present

## 2019-12-11 DIAGNOSIS — C7931 Secondary malignant neoplasm of brain: Secondary | ICD-10-CM | POA: Diagnosis not present

## 2019-12-11 DIAGNOSIS — Z87891 Personal history of nicotine dependence: Secondary | ICD-10-CM | POA: Insufficient documentation

## 2019-12-11 DIAGNOSIS — G7129 Other congenital myopathy: Secondary | ICD-10-CM | POA: Diagnosis not present

## 2019-12-11 DIAGNOSIS — C7971 Secondary malignant neoplasm of right adrenal gland: Secondary | ICD-10-CM | POA: Insufficient documentation

## 2019-12-11 DIAGNOSIS — Z79899 Other long term (current) drug therapy: Secondary | ICD-10-CM | POA: Insufficient documentation

## 2019-12-11 DIAGNOSIS — F419 Anxiety disorder, unspecified: Secondary | ICD-10-CM | POA: Insufficient documentation

## 2019-12-11 DIAGNOSIS — R110 Nausea: Secondary | ICD-10-CM | POA: Diagnosis not present

## 2019-12-11 DIAGNOSIS — R51 Headache with orthostatic component, not elsewhere classified: Secondary | ICD-10-CM | POA: Diagnosis not present

## 2019-12-11 DIAGNOSIS — E860 Dehydration: Secondary | ICD-10-CM | POA: Diagnosis not present

## 2019-12-11 DIAGNOSIS — T451X5A Adverse effect of antineoplastic and immunosuppressive drugs, initial encounter: Secondary | ICD-10-CM | POA: Diagnosis not present

## 2019-12-11 DIAGNOSIS — Z95828 Presence of other vascular implants and grafts: Secondary | ICD-10-CM

## 2019-12-11 MED ORDER — ONDANSETRON HCL 4 MG/2ML IJ SOLN
4.0000 mg | Freq: Once | INTRAMUSCULAR | Status: AC
  Administered 2019-12-11: 4 mg via INTRAVENOUS
  Filled 2019-12-11: qty 2

## 2019-12-11 MED ORDER — OXYCODONE HCL 10 MG PO TABS: 10.0000 mg | ORAL_TABLET | ORAL | 0 refills | Status: DC | PRN

## 2019-12-11 MED ORDER — SODIUM CHLORIDE 0.9% FLUSH
10.0000 mL | INTRAVENOUS | Status: DC | PRN
  Administered 2019-12-11: 10 mL via INTRAVENOUS
  Filled 2019-12-11: qty 10

## 2019-12-11 MED ORDER — CITALOPRAM HYDROBROMIDE 40 MG PO TABS: 40.0000 mg | ORAL_TABLET | Freq: Every day | ORAL | 3 refills | Status: AC

## 2019-12-11 MED ORDER — SODIUM CHLORIDE 0.9 % IV SOLN
Freq: Once | INTRAVENOUS | Status: AC
  Filled 2019-12-11: qty 250

## 2019-12-11 MED ORDER — DEXAMETHASONE SODIUM PHOSPHATE 10 MG/ML IJ SOLN
4.0000 mg | Freq: Once | INTRAMUSCULAR | Status: AC
  Administered 2019-12-11: 4 mg via INTRAVENOUS
  Filled 2019-12-11: qty 1

## 2019-12-11 MED ORDER — HEPARIN SOD (PORK) LOCK FLUSH 100 UNIT/ML IV SOLN
500.0000 [IU] | Freq: Once | INTRAVENOUS | Status: AC
  Administered 2019-12-11: 500 [IU] via INTRAVENOUS
  Filled 2019-12-11: qty 5

## 2019-12-11 NOTE — Progress Notes (Signed)
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  PMH is also notable for muscular dystrophy followed by the neuromuscular clinic at [facility], anxiety, history of hypothyroidism, recurrent hyperthermia, and history of lower extremity DVT.    Patient is status post whole brain radiation and currently on chemotherapy with carboplatin and paclitaxel.  Patient reports intermittent abdominal pain and worsening numbness and tingling to hands and lower extremities.  She was recently prescribed Xtampza ER but refused to take it and so was switched to transdermal fentanyl.  She is also refused to take the transdermal fentanyl.  She has been taking oxycodone every 4 hours around-the-clock for pain.  She has had persistent nausea partially improved with use of ondansetron.  She was recently prescribed olanzapine but has not started it.  Patient reports worsening weakness.  Bed rails and other DME were ordered but reportedly insurance would not cover.  Patient sister is the primary caregiver and has been somewhat overwhelmed with her care in the home.  Patient has had increased anxiety and depressive symptoms.  ECOG FS:3 - Symptomatic, >50% confined to bed  Review of systems- Review of Systems  Constitutional: Positive for malaise/fatigue. Negative for chills, fever and weight loss.  Cardiovascular: Negative  for chest pain.  Gastrointestinal: Positive for abdominal pain and nausea. Negative for constipation, diarrhea and vomiting.  Genitourinary: Negative for dysuria.  Musculoskeletal: Negative for falls and myalgias.  Skin: Negative for itching and rash.  Neurological: Positive for weakness. Negative for dizziness, tingling, tremors, focal weakness, loss of consciousness and headaches.  Psychiatric/Behavioral: Positive for depression. Negative for suicidal ideas. The patient is nervous/anxious.      Current treatment-paclitaxel/carboplatin  Allergies  Allergen Reactions  . Paxil [Paroxetine Hcl] Other (See Comments)    Hallucinations   . Ciprofloxacin Other (See Comments)    Unknown  . Levaquin [Levofloxacin] Other (See Comments)    Unknown   . 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 She has an increase risk of malignant hyperthermia with anesthesia. She should not receive Propofol.   Donne Hazel [Nefazodone] Other (See Comments)    Unknown   . Biaxin [Clarithromycin] Nausea Only  . Erythromycin Nausea Only  . Penicillins Rash    Past Medical History:  Diagnosis Date  . Anxiety   . Arthritis   . Chronic kidney disease   . Family history of breast cancer    aunt  . GERD (gastroesophageal reflux disease)   . History of DVT of lower extremity    Left leg  . Lung cancer ([place])    with brain mets  . Lung mass   . Malignant hyperthermia   . 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[place])    mild form - per patient central cord disease  . Muscular dystrophy [facility])     Past Surgical History:  Procedure Laterality Date  . CESAREAN SECTION  11/1985  .  COLONOSCOPY N/A 10/27/2015   Procedure: COLONOSCOPY;  Surgeon: Manya Silvas, MD;  Location: [facility] ENDOSCOPY;  Service: Endoscopy;  Laterality: N/A;   NO Propofol - per office  . LITHOTRIPSY    . PORTA CATH INSERTION N/A 11/09/2019   Procedure: PORTA CATH INSERTION;  Surgeon: Algernon Huxley, MD;  Location: [place] CV LAB;  Service: Cardiovascular;  Laterality: N/A;  . TUBAL LIGATION  03/1986    Social History   Socioeconomic History  . Marital status: Divorced    Spouse name: Not on file  . Number of children: Not on file  . Years of education: Not on file  . Highest education level: Not on file  Occupational History  . Not on file  Tobacco Use  . Smoking status: Former Smoker    Packs/day: 1.00    Types: Cigarettes    Quit date: 05/2016    Years since quitting: 3.6  . Smokeless tobacco: Never Used  Substance and Sexual Activity  . Alcohol use: No  . Drug use: No  . Sexual activity: Not on file  Other Topics Concern  . Not on file  Social History Narrative  . Not on file   Social Determinants of Health   Financial Resource Strain:   . Difficulty of Paying Living Expenses: Not on file  Food Insecurity:   . Worried About Charity fundraiser in the Last Year: Not on file  . Ran Out of Food in the Last Year: Not on file  Transportation Needs:   . Lack of Transportation (Medical): Not on file  . Lack of Transportation (Non-Medical): Not on file  Physical Activity:   . Days of Exercise per Week: Not on file  . Minutes of Exercise per Session: Not on file  Stress:   . Feeling of Stress : Not on file  Social Connections:   . Frequency of Communication with Friends and Family: Not on file  . Frequency of Social Gatherings with Friends and Family: Not on file  . Attends Religious Services: Not on file  . Active Member of Clubs or Organizations: Not on file  . Attends Archivist Meetings: Not on file  . Marital Status: Not on file  Intimate Partner Violence:   . Fear of Current or Ex-Partner: Not on file  . Emotionally Abused: Not on file  . Physically Abused: Not on file  . Sexually Abused: Not on file    Family History  Problem Relation Age of Onset  . Cancer Father        bladder, lung  . Breast cancer Maternal Aunt 45      Current Outpatient Medications:  .  acetaminophen (TYLENOL) 325 MG tablet, Take 650 mg by mouth every 6 (six) hours as needed., Disp: , Rfl:  .  ALPRAZolam (XANAX) 0.25 MG tablet, Take 0.25 mg by mouth at bedtime as needed for anxiety., Disp: , Rfl:  .  apixaban (ELIQUIS) 5 MG TABS tablet, Take 1 tablet (5 mg total) by mouth 2 (two) times daily., Disp: 60 tablet, Rfl: 2 .  citalopram (CELEXA) 20 MG tablet, Take 1 tablet (20 mg total) by mouth daily., Disp: 30 tablet, Rfl: 2 .  dexamethasone (DECADRON) 4 MG tablet, Take 2 tablets (8 mg total) by mouth daily. Start the day after carboplatin chemotherapy for 3 days., Disp: 30 tablet, Rfl: 1 .  fentaNYL (DURAGESIC) 12 MCG/HR, Place 1 patch onto the skin every 3 (three) days., Disp: 10 patch, Rfl: 0 .  fluticasone (FLONASE) 50 MCG/ACT nasal spray, Place 2 sprays into both nostrils daily as needed. , Disp: , Rfl:  .  lidocaine-prilocaine (EMLA) cream, Apply to affected area once, Disp: 30 g, Rfl: 3 .  magic mouthwash SOLN, Take 5 mLs by mouth 3 (three) times daily as needed for mouth pain., Disp: 300 mL, Rfl: 1 .  methimazole (TAPAZOLE) 5 MG tablet, Take 5 mg by mouth daily., Disp: , Rfl:  .  metoprolol succinate (TOPROL-XL) 25 MG 24 hr tablet, Take 12.5 tablets by mouth daily., Disp: , Rfl:  .  nystatin (MYCOSTATIN) 100000 UNIT/ML suspension, Take 5 mLs (500,000 Units total) by mouth 4 (four) times daily. Swish and spit., Disp: 300 mL, Rfl: 0 .  OLANZapine (ZYPREXA) 10 MG tablet, Take 1 tablet (10 mg total) by mouth at bedtime., Disp: 30 tablet, Rfl: 1 .  ondansetron (ZOFRAN) 8 MG tablet, Take 1 tablet (8 mg total) by mouth 2 (two) times daily as needed for refractory nausea / vomiting. Start on day 3 after carboplatin chemo., Disp: 30 tablet, Rfl: 1 .  ondansetron (ZOFRAN-ODT) 4 MG disintegrating tablet, Take 1 tablet (4 mg total) by mouth every 8 (eight) hours as needed for nausea or vomiting., Disp: 30 tablet, Rfl: 2 .  oxyCODONE ER (XTAMPZA ER) 13.5  MG C12A, Take 1 tablet by mouth every 12 (twelve) hours., Disp: 60 capsule, Rfl: 0 .  Oxycodone HCl 10 MG TABS, Take 1 tablet (10 mg total) by mouth every 4 (four) hours as needed., Disp: 60 tablet, Rfl: 0 .  pantoprazole (PROTONIX) 20 MG tablet, Take 1 tablet (20 mg total) by mouth 2 (two) times daily., Disp: 120 tablet, Rfl: 0 .  predniSONE (DELTASONE) 10 MG tablet, Take 1 tablet (10 mg total) by mouth as directed. 5 tablets daily  X 2 days, 4 tablet daily x 2 days, 3 tablet x 2 days, 2 tablet x2 days and then 1 tablet x 2 days with food every day, Disp: 30 tablet, Rfl: 0 .  prochlorperazine (COMPAZINE) 10 MG tablet, Take 0.5-1 tablets (5-10 mg total) by mouth every 6 (six) hours as needed for nausea or vomiting., Disp: 30 tablet, Rfl: 2 .  prochlorperazine (COMPAZINE) 10 MG tablet, Take 1 tablet (10 mg total) by mouth every 6 (six) hours as needed (Nausea or vomiting)., Disp: 30 tablet, Rfl: 1 .  sucralfate (CARAFATE) 1 g tablet, Take 1 tablet (1 g total) by mouth 3 (three) times daily. Dissolve in 3-4 tbsp warm water, swish and swallow., Disp: 90 tablet, Rfl: 3 .  vitamin B-12 (CYANOCOBALAMIN) 1000 MCG tablet, Take 1 tablet (1,000 mcg total) by mouth daily., Disp: 30 tablet, Rfl: 1  Physical exam:  Vitals:   12/11/19 1009  BP: 134/79  Pulse: 79  Resp: 18  Temp: 98.2 F (36.8 C)  TempSrc: Tympanic  SpO2: 99%  Weight: 134 lb (60.8 kg)   Physical Exam Constitutional:      Appearance: Normal appearance.  HENT:     Head: Normocephalic and atraumatic.     Mouth/Throat:     Mouth: Mucous membranes are dry.  Eyes:     Pupils: Pupils are equal, round, and reactive to light.  Pulmonary:     Effort: Pulmonary effort is normal.  Abdominal:     General: Abdomen is flat.     Palpations: Abdomen is soft.  Musculoskeletal:        General: Normal range of motion.     Cervical back: Normal range of motion and neck  supple.  Skin:    General: Skin is warm and dry.  Neurological:     General:  No focal deficit present.     Mental Status: She is alert and oriented to person, place, and time. Mental status is at baseline.     Comments: Generalized weakness  Psychiatric:     Comments: Tearful      CMP Latest Ref Rng & Units 12/03/2019  Glucose 70 - 99 mg/dL 109(H)  BUN 6 - 20 mg/dL 16  Creatinine 0.44 - 1.00 mg/dL 0.35(L)  Sodium 135 - 145 mmol/L 138  Potassium 3.5 - 5.1 mmol/L 4.0  Chloride 98 - 111 mmol/L 100  CO2 22 - 32 mmol/L 28  Calcium 8.9 - 10.3 mg/dL 9.0  Total Protein 6.5 - 8.1 g/dL 6.9  Total Bilirubin 0.3 - 1.2 mg/dL 0.4  Alkaline Phos 38 - 126 U/L 71  AST 15 - 41 U/L 19  ALT 0 - 44 U/L 27   CBC Latest Ref Rng & Units 12/03/2019  WBC 4.0 - 10.5 K/uL 12.2(H)  Hemoglobin 12.0 - 15.0 g/dL 10.3(L)  Hematocrit 36.0 - 46.0 % 34.2(L)  Platelets 150 - 400 K/uL 623(H)    No images are attached to the encounter.  No results found.  Assessment and plan- Patient is a 59 y.o. 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  Patient presents to this [facility] for evaluation of pain, nausea, and lower extremity numbness and tingling.  Stage IV squamous cell carcinoma of the lung -on current treatment with carboplatin and Taxol.  Patient completed cycle 2 on 12/03/2019.  Patient is pending repeat CT of abdomen/pelvis and will follow up for next appointment on 1/18 with Dr. Janese Banks.  Patient will also see me that day.  Nausea -likely chemo induced.  Patient has some benefit from ondansetron but says it is short-lived.  Will increase dose of ondansetron to 8 mg 3 times daily.  She was prescribed olanzapine by Dr. Janese Banks but has not started it.  Patient says she is now in agreement with starting olanzapine nightly.  Could try 5 mg twice daily dosing if needed.  Will give 500 mL normal saline, 4 mg IV ondansetron, and 4 mg IV dexamethasone today in the clinic.   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  Patient has been taking oxycodone 10 mg every 4 hours around-the-clock.  She was prescribed transdermal fentanyl 12 mcg every 72 hours but has not started it.  Patient says she is now willing to start the fentanyl.  Could try gabapentin if needed but I am hesitant to make too many changes today.  Also discussed nonpharmacological strategies for management of neuropathic pain including acupuncture and capsaicin.  Depression/anxiety -patient on citalopram 20 mg and has tolerated it well over the past month.  Will increase dose to 40 mg daily.  She is also on alprazolam as needed.  Hopefully, initiation of olanzapine (with consideration of future gabapentin) will also help.   Weakness -patient using Rollator walker in the home.  Denies falls but is relying increasingly on family for daily care.  Insurance would not cover the cost of bedside rails.  Will order home health PT/OT and hospital bed.  We will also order SW to help with resource coordination.  Patient has Medicaid and would likely benefit from CAPS vs. PCS.   DME: Hospital bed.  Due  to weakness and chronic pain, patient requires frequent changes in body position and have immediate need to change body position.  The patient has a medical condition which requires positioning of body in ways not feasible with an ordinary bed and in ways not feasible with an ordinary bed to alleviate pain.   Case and plan discussed with Dr. Janese Banks  Follow-up telephone visit later this week   Patient expressed understanding and was in agreement with this plan. She also understands that She can call clinic at any time with any questions, concerns, or complaints.   Thank you for allowing me to participate in the care of this very pleasant patient.   Time Total: 60 minutes  Visit consisted of counseling and education dealing with the complex and emotionally intense issues of symptom  management and palliative care in the setting of serious and potentially life-threatening illness.Greater than 50%  of this time was spent counseling and coordinating care related to the above assessment and plan.  Signed by: Altha Harm, PhD, NP-C

## 2019-12-11 NOTE — Patient Instructions (Signed)
-  Increase citalopram to 40 mg daily (may use two 20 mg tablets but have sent a new prescription) -Start transdermal fentanyl and apply over area of subcutaneous fat and change every 3 days -Continue oxycodone as needed for breakthrough pain -May increase ondansetron to 8 mg every 8 hours as needed for nausea -Start olanzapine 10 mg nightly -Home health physical therapy, Occupational Therapy, social work, and home health aide were ordered [facility] bed -Follow-up telephone visit later this week

## 2019-12-12 ENCOUNTER — Encounter: Payer: Medicare Other | Admitting: Hospice and Palliative Medicine

## 2019-12-12 ENCOUNTER — Other Ambulatory Visit: Payer: Self-pay | Admitting: *Deleted

## 2019-12-12 ENCOUNTER — Ambulatory Visit: Payer: Medicare Other | Admitting: Radiation Oncology

## 2019-12-13 ENCOUNTER — Inpatient Hospital Stay (HOSPITAL_BASED_OUTPATIENT_CLINIC_OR_DEPARTMENT_OTHER): Payer: Medicare Other | Admitting: Hospice and Palliative Medicine

## 2019-12-13 DIAGNOSIS — R110 Nausea: Secondary | ICD-10-CM | POA: Diagnosis not present

## 2019-12-13 DIAGNOSIS — Z515 Encounter for palliative care: Secondary | ICD-10-CM | POA: Diagnosis not present

## 2019-12-13 DIAGNOSIS — C3431 Malignant neoplasm of lower lobe, right bronchus or lung: Secondary | ICD-10-CM

## 2019-12-13 DIAGNOSIS — G893 Neoplasm related pain (acute) (chronic): Secondary | ICD-10-CM | POA: Diagnosis not present

## 2019-12-13 NOTE — Progress Notes (Signed)
Virtual Visit via Telephone Note  I connected with Janus Molder on 12/13/19 at  9:30 AM EST by telephone and verified that I am speaking with the correct person using two identifiers.   I discussed the limitations, risks, security and privacy concerns of performing an evaluation and management service by telephone and the availability of in person appointments. I also discussed with the patient that there may be a patient responsible charge related to this service. The patient expressed understanding and agreed to proceed.   History of Present Illness: Ms. Ebony Yorio is a 59 y.o. 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  PMH is also notable for muscular dystrophy followed by neuromuscular clinic at [facility], anxiety, history of hypothyroidism, recurrent hyperthermia, and history of lower extremity DVT.  Patient was diagnosed with stage IV lung cancer after having right flank pain over several months.  She ultimately underwent PET/CT on 09/20/2019 with findings of hypermetabolic mass in the right lower lobe, supraclavicular/mediastinal/right hilar lymph nodes, and bilateral adrenal glands.  Patient was also found to have several brain masses on MRI.  Patient is receiving whole brain radiation.  There has been discussion about systemic treatment.  Palliative care was consulted to help address goals and manage ongoing symptoms.   Observations/Objective: I called and spoke with patient by phone.  She reports that overall she is feeling better today with less pain and nausea.  She says that she forgot to apply the fentanyl patch.  It does sound like she is taking the olanzapine as prescribed.  She continues to require oxycodone around-the-clock.  Appetite reportedly is improved.  Patient is pending initiation of home health services.  Assessment and Plan: Stage IV squamous cell carcinoma the lung -on current treatment with  carboplatin/paclitaxel/pembrolizumab.  She is pending repeat CT abdomen/pelvis tomorrow and then will have planned follow-up with Dr. Janese Banks on 1/18.  Neoplasm related pain -patient says that the pain is improved.  She is yet to start the transdermal fentanyl and we again discussed that medication in detail today.  Patient continues to use oxycodone as needed for breakthrough pain.  However, she is requiring it around-the-clock.  Again encouraged trial of a long-acting opioid.  I do think that there is a neuropathic component to her pain and she might benefit from gabapentin.  However, I am hesitant to start multiple medications at the same time before maximizing what she is already on.  Nausea -improved with use of ondansetron and Zyprexa  Weakness -Home health is pending.  Again, I think she would be appropriate for PCS/CAPS through Medicaid  Case and plan discussed with Dr. Janese Banks  Follow Up Instructions: Follow-up telephone visit next week   I discussed the assessment and treatment plan with the patient. The patient was provided an opportunity to ask questions and all were answered. The patient agreed with the plan and demonstrated an understanding of the instructions.   The patient was advised to call back or seek an in-person evaluation if the symptoms worsen or if the condition fails to improve as anticipated.  I provided 5 minutes of non-face-to-face time during this encounter.   Irean Hong, NP

## 2019-12-14 ENCOUNTER — Ambulatory Visit: Payer: Medicare Other

## 2019-12-14 ENCOUNTER — Ambulatory Visit: Payer: Medicare Other | Admitting: Oncology

## 2019-12-14 ENCOUNTER — Other Ambulatory Visit: Payer: Medicare Other

## 2019-12-17 ENCOUNTER — Other Ambulatory Visit: Payer: Self-pay | Admitting: Hospice and Palliative Medicine

## 2019-12-17 ENCOUNTER — Other Ambulatory Visit: Payer: Self-pay | Admitting: *Deleted

## 2019-12-17 DIAGNOSIS — C3431 Malignant neoplasm of lower lobe, right bronchus or lung: Secondary | ICD-10-CM

## 2019-12-17 MED ORDER — ONDANSETRON HCL 8 MG PO TABS: 8.0000 mg | ORAL_TABLET | Freq: Two times a day (BID) | ORAL | 1 refills | Status: DC | PRN

## 2019-12-17 MED ORDER — ONDANSETRON 8 MG PO TBDP: 8.0000 mg | ORAL_TABLET | Freq: Three times a day (TID) | ORAL | 3 refills | Status: DC | PRN

## 2019-12-17 NOTE — Progress Notes (Signed)
I spoke with patient's sister by phone. She says the ondansetron tablets have been less effective when compared to the disintegrating tablets.  We will send a new prescription for ondansetron 8 mg ODT #90  Also spoke with Corene Cornea with [facility] regarding a social worker to coordinate home resources and arrange Medicaid PCS

## 2019-12-18 ENCOUNTER — Other Ambulatory Visit: Payer: Self-pay | Admitting: Oncology

## 2019-12-20 ENCOUNTER — Other Ambulatory Visit: Payer: Self-pay

## 2019-12-20 ENCOUNTER — Ambulatory Visit
Admission: RE | Admit: 2019-12-20 | Discharge: 2019-12-20 | Disposition: A | Discharge status: Home / Self Care | Payer: Medicare Other | Source: Ambulatory Visit | Attending: Oncology | Admitting: Oncology

## 2019-12-20 DIAGNOSIS — C3431 Malignant neoplasm of lower lobe, right bronchus or lung: Secondary | ICD-10-CM | POA: Insufficient documentation

## 2019-12-20 DIAGNOSIS — C7972 Secondary malignant neoplasm of left adrenal gland: Secondary | ICD-10-CM | POA: Insufficient documentation

## 2019-12-20 DIAGNOSIS — C7971 Secondary malignant neoplasm of right adrenal gland: Secondary | ICD-10-CM | POA: Diagnosis present

## 2019-12-20 DIAGNOSIS — C7931 Secondary malignant neoplasm of brain: Secondary | ICD-10-CM | POA: Insufficient documentation

## 2019-12-20 MED ORDER — IOHEXOL 300 MG/ML  SOLN
100.0000 mL | Freq: Once | INTRAMUSCULAR | Status: AC | PRN
  Administered 2019-12-20: 100 mL via INTRAVENOUS

## 2019-12-21 ENCOUNTER — Other Ambulatory Visit: Payer: Self-pay

## 2019-12-21 ENCOUNTER — Inpatient Hospital Stay (HOSPITAL_BASED_OUTPATIENT_CLINIC_OR_DEPARTMENT_OTHER): Payer: Medicare Other | Admitting: Hospice and Palliative Medicine

## 2019-12-21 DIAGNOSIS — Z515 Encounter for palliative care: Secondary | ICD-10-CM

## 2019-12-21 DIAGNOSIS — C3431 Malignant neoplasm of lower lobe, right bronchus or lung: Secondary | ICD-10-CM | POA: Diagnosis not present

## 2019-12-21 NOTE — Progress Notes (Signed)
Virtual Visit via Telephone Note  I connected with Deborah Nguyen on 12/21/19 at 11:30 AM EST by telephone and verified that I am speaking with the correct person using two identifiers.   I discussed the limitations, risks, security and privacy concerns of performing an evaluation and management service by telephone and the availability of in person appointments. I also discussed with the patient that there may be a patient responsible charge related to this service. The patient expressed understanding and agreed to proceed.   History of Present Illness: Ms. Deborah Nguyen is a 59 y.o. 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  PMH is also notable for muscular dystrophy followed by neuromuscular clinic at [facility], anxiety, history of hypothyroidism, recurrent hyperthermia, and history of lower extremity DVT.  Patient was diagnosed with stage IV lung cancer after having right flank pain over several months.  She ultimately underwent PET/CT on 09/20/2019 with findings of hypermetabolic mass in the right lower lobe, supraclavicular/mediastinal/right hilar lymph nodes, and bilateral adrenal glands.  Patient was also found to have several brain masses on MRI.  Patient is receiving whole brain radiation.  There has been discussion about systemic treatment.  Palliative care was consulted to help address goals and manage ongoing symptoms.   Observations/Objective: Called spoke with patient by phone.  She reports that she is doing well.  She denies any significant changes or concerns.  She continues to have intermittent nausea but says it is better today.  She finds improvement with use of ondansetron.  Oral intake is stable.  Patient is drinking supplements daily.  Patient reports that pain is waking her at times during the night.  We again discussed use of transdermal fentanyl, which she says she will start if needed.  CT of chest, abdomen,  and pelvis yesterday revealed interval disease improvement.  Patient says she saw the social worker who is coordinating home resources  Assessment and Plan: Stage IV squamous cell carcinoma the lung -on current treatment with carboplatin/paclitaxel/pembrolizumab.  CT scan revealed interval decrease in size of tumor burden.  Neoplasm related pain -continue oxycodone.  Patient has transdermal fentanyl at home, which she can start if needed for long-acting control  Nausea -improved with use of ondansetron and Zyprexa  Weakness -Home health following  Follow Up Instructions: Follow-up telephone visit 2-3 weeks   I discussed the assessment and treatment plan with the patient. The patient was provided an opportunity to ask questions and all were answered. The patient agreed with the plan and demonstrated an understanding of the instructions.   The patient was advised to call back or seek an in-person evaluation if the symptoms worsen or if the condition fails to improve as anticipated.  I provided 8 minutes of non-face-to-face time during this encounter.   Irean Hong, NP

## 2019-12-24 ENCOUNTER — Encounter: Payer: Self-pay | Admitting: Oncology

## 2019-12-24 ENCOUNTER — Inpatient Hospital Stay (HOSPITAL_BASED_OUTPATIENT_CLINIC_OR_DEPARTMENT_OTHER): Payer: Medicare Other | Admitting: Hospice and Palliative Medicine

## 2019-12-24 ENCOUNTER — Inpatient Hospital Stay: Payer: Medicare Other

## 2019-12-24 ENCOUNTER — Other Ambulatory Visit: Payer: Self-pay

## 2019-12-24 ENCOUNTER — Inpatient Hospital Stay (HOSPITAL_BASED_OUTPATIENT_CLINIC_OR_DEPARTMENT_OTHER): Payer: Medicare Other | Admitting: Oncology

## 2019-12-24 ENCOUNTER — Ambulatory Visit
Admission: RE | Admit: 2019-12-24 | Discharge: 2019-12-24 | Disposition: A | Discharge status: Home / Self Care | Payer: Medicare Other | Source: Ambulatory Visit | Attending: Radiation Oncology | Admitting: Radiation Oncology

## 2019-12-24 VITALS — BP 119/65 | HR 75 | Temp 97.9°F | Ht 64.0 in | Wt 139.0 lb

## 2019-12-24 DIAGNOSIS — R112 Nausea with vomiting, unspecified: Secondary | ICD-10-CM

## 2019-12-24 DIAGNOSIS — Z515 Encounter for palliative care: Secondary | ICD-10-CM

## 2019-12-24 DIAGNOSIS — C3490 Malignant neoplasm of unspecified part of unspecified bronchus or lung: Secondary | ICD-10-CM

## 2019-12-24 DIAGNOSIS — Z5111 Encounter for antineoplastic chemotherapy: Secondary | ICD-10-CM

## 2019-12-24 DIAGNOSIS — C3431 Malignant neoplasm of lower lobe, right bronchus or lung: Secondary | ICD-10-CM

## 2019-12-24 DIAGNOSIS — C7971 Secondary malignant neoplasm of right adrenal gland: Secondary | ICD-10-CM

## 2019-12-24 DIAGNOSIS — Z5112 Encounter for antineoplastic immunotherapy: Secondary | ICD-10-CM

## 2019-12-24 DIAGNOSIS — G893 Neoplasm related pain (acute) (chronic): Secondary | ICD-10-CM

## 2019-12-24 DIAGNOSIS — T451X5A Adverse effect of antineoplastic and immunosuppressive drugs, initial encounter: Secondary | ICD-10-CM

## 2019-12-24 DIAGNOSIS — C7972 Secondary malignant neoplasm of left adrenal gland: Secondary | ICD-10-CM

## 2019-12-24 LAB — CBC WITH DIFFERENTIAL/PLATELET
Abs Immature Granulocytes: 0.02 10*3/uL (ref 0.00–0.07)
Basophils Absolute: 0.1 10*3/uL (ref 0.0–0.1)
Basophils Relative: 1 %
Eosinophils Absolute: 0 10*3/uL (ref 0.0–0.5)
Eosinophils Relative: 0 %
HCT: 33.1 % — ABNORMAL LOW (ref 36.0–46.0)
Hemoglobin: 10.2 g/dL — ABNORMAL LOW (ref 12.0–15.0)
Immature Granulocytes: 0 %
Lymphocytes Relative: 8 %
Lymphs Abs: 0.6 10*3/uL — ABNORMAL LOW (ref 0.7–4.0)
MCH: 29.8 pg (ref 26.0–34.0)
MCHC: 30.8 g/dL (ref 30.0–36.0)
MCV: 96.8 fL (ref 80.0–100.0)
Monocytes Absolute: 0.7 10*3/uL (ref 0.1–1.0)
Monocytes Relative: 11 %
Neutro Abs: 5.3 10*3/uL (ref 1.7–7.7)
Neutrophils Relative %: 80 %
Platelets: 339 10*3/uL (ref 150–400)
RBC: 3.42 MIL/uL — ABNORMAL LOW (ref 3.87–5.11)
RDW: 17.2 % — ABNORMAL HIGH (ref 11.5–15.5)
WBC: 6.7 10*3/uL (ref 4.0–10.5)
nRBC: 0 % (ref 0.0–0.2)

## 2019-12-24 LAB — COMPREHENSIVE METABOLIC PANEL
ALT: 23 U/L (ref 0–44)
AST: 21 U/L (ref 15–41)
Albumin: 3.4 g/dL — ABNORMAL LOW (ref 3.5–5.0)
Alkaline Phosphatase: 96 U/L (ref 38–126)
Anion gap: 9 (ref 5–15)
BUN: 11 mg/dL (ref 6–20)
CO2: 28 mmol/L (ref 22–32)
Calcium: 9 mg/dL (ref 8.9–10.3)
Chloride: 102 mmol/L (ref 98–111)
Creatinine, Ser: 0.38 mg/dL — ABNORMAL LOW (ref 0.44–1.00)
GFR calc Af Amer: >60 mL/min (ref >60)
GFR calc non Af Amer: >60 mL/min (ref >60)
Glucose, Bld: 107 mg/dL — ABNORMAL HIGH (ref 70–99)
Potassium: 3.8 mmol/L (ref 3.5–5.1)
Sodium: 139 mmol/L (ref 135–145)
Total Bilirubin: 0.5 mg/dL (ref 0.3–1.2)
Total Protein: 6.9 g/dL (ref 6.5–8.1)

## 2019-12-24 MED ORDER — SODIUM CHLORIDE 0.9 % IV SOLN
175.0000 mg/m2 | Freq: Once | INTRAVENOUS | Status: AC
  Administered 2019-12-24: 282 mg via INTRAVENOUS
  Filled 2019-12-24: qty 47

## 2019-12-24 MED ORDER — HEPARIN SOD (PORK) LOCK FLUSH 100 UNIT/ML IV SOLN
500.0000 [IU] | Freq: Once | INTRAVENOUS | Status: DC | PRN
  Filled 2019-12-24: qty 5

## 2019-12-24 MED ORDER — SODIUM CHLORIDE 0.9 % IV SOLN: 750.0000 mg | Freq: Once | INTRAVENOUS | Status: DC

## 2019-12-24 MED ORDER — SODIUM CHLORIDE 0.9 % IV SOLN
Freq: Once | INTRAVENOUS | Status: AC
  Filled 2019-12-24: qty 5

## 2019-12-24 MED ORDER — HEPARIN SOD (PORK) LOCK FLUSH 100 UNIT/ML IV SOLN
INTRAVENOUS | Status: AC
  Filled 2019-12-24: qty 5

## 2019-12-24 MED ORDER — SODIUM CHLORIDE 0.9 % IV SOLN
500.0000 mg | Freq: Once | INTRAVENOUS | Status: AC
  Administered 2019-12-24: 500 mg via INTRAVENOUS
  Filled 2019-12-24: qty 50

## 2019-12-24 MED ORDER — DIPHENHYDRAMINE HCL 50 MG/ML IJ SOLN
50.0000 mg | Freq: Once | INTRAMUSCULAR | Status: AC
  Administered 2019-12-24: 50 mg via INTRAVENOUS
  Filled 2019-12-24: qty 1

## 2019-12-24 MED ORDER — FAMOTIDINE IN NACL 20-0.9 MG/50ML-% IV SOLN
20.0000 mg | Freq: Once | INTRAVENOUS | Status: AC
  Administered 2019-12-24: 20 mg via INTRAVENOUS
  Filled 2019-12-24: qty 50

## 2019-12-24 MED ORDER — PREDNISONE 10 MG PO TABS: 10.0000 mg | ORAL_TABLET | ORAL | 0 refills | Status: DC

## 2019-12-24 MED ORDER — SODIUM CHLORIDE 0.9 % IV SOLN
Freq: Once | INTRAVENOUS | Status: AC
  Filled 2019-12-24: qty 250

## 2019-12-24 MED ORDER — PALONOSETRON HCL INJECTION 0.25 MG/5ML
0.2500 mg | Freq: Once | INTRAVENOUS | Status: AC
  Administered 2019-12-24: 0.25 mg via INTRAVENOUS
  Filled 2019-12-24: qty 5

## 2019-12-24 MED ORDER — HEPARIN SOD (PORK) LOCK FLUSH 100 UNIT/ML IV SOLN
500.0000 [IU] | Freq: Once | INTRAVENOUS | Status: AC
  Administered 2019-12-24: 500 [IU] via INTRAVENOUS
  Filled 2019-12-24: qty 5

## 2019-12-24 MED ORDER — SODIUM CHLORIDE 0.9 % IV SOLN
200.0000 mg | Freq: Once | INTRAVENOUS | Status: AC
  Administered 2019-12-24: 200 mg via INTRAVENOUS
  Filled 2019-12-24: qty 8

## 2019-12-24 MED ORDER — SODIUM CHLORIDE 0.9% FLUSH
10.0000 mL | Freq: Once | INTRAVENOUS | Status: AC
  Administered 2019-12-24: 10 mL via INTRAVENOUS
  Filled 2019-12-24: qty 10

## 2019-12-24 NOTE — Progress Notes (Signed)
Patient stated that she had been feeling tired, fatigued and swelling all over her body.

## 2019-12-24 NOTE — Progress Notes (Signed)
[place]  [phone] [phone]   Name: Deborah Nguyen Date: 12/24/2019 MRN: [id number]  DOB: 01-Aug-1961  Patient Care Team: Deborah Harrier, MD as PCP - General (Internal Medicine) Telford Nab, RN as Registered Nurse    REASON FOR CONSULTATION: Ms. 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 PMH is also notable for muscular dystrophy followed by neuromuscular clinic at [facility], anxiety, history of hypothyroidism, recurrent hyperthermia, and history of lower extremity DVT. Patient was diagnosed with stage IV lung cancer after having right flank pain over several months. She ultimately underwent PET/CT on 09/20/2019 with findings of hypermetabolic mass in the right lower lobe, supraclavicular/mediastinal/right hilar lymph nodes, and bilateral adrenal glands. Patient was also found to have several brain masses on MRI. Patient is receiving whole brain radiation. There has been discussion about systemic treatment. Palliative care was consulted to help address goals and manage ongoing symptoms.  SOCIAL HISTORY:     reports that she quit smoking about 3 years ago. Her smoking use included cigarettes. She smoked 1.00 pack per day. She has never used smokeless tobacco. She reports that she does not drink alcohol or use drugs.   Patient is unmarried.  She lives at home in an apartment with her twin sister as her primary caregiver.  Patient has a son and two grandchildren.  Patient is disabled from her muscular dystrophy but worked in a [employer] earlier in life.  ADVANCE DIRECTIVES:  In process of establishing  CODE STATUS: DNR/DNI (MOST Form completed on 10/25/2019)  PAST MEDICAL HISTORY: Past Medical History:  Diagnosis Date  . Anxiety   . Arthritis   . Chronic kidney disease   . Family history of breast cancer    aunt    . GERD (gastroesophageal reflux disease)   . History of DVT of lower extremity    Left leg  . Lung cancer ([place])    with brain mets  . Lung mass   . Malignant hyperthermia   . 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[place])    mild form - per patient central cord disease  . Muscular dystrophy ([place])     PAST SURGICAL HISTORY:  Past Surgical History:  Procedure Laterality Date  . CESAREAN SECTION  11/1985  . COLONOSCOPY N/A 10/27/2015   Procedure: COLONOSCOPY;  Surgeon: Deborah Silvas, MD;  Location: [facility] ENDOSCOPY;  Service: Endoscopy;  Laterality: N/A;   NO Propofol - per office  . LITHOTRIPSY    . PORTA CATH INSERTION N/A 11/09/2019   Procedure: PORTA CATH INSERTION;  Surgeon: Deborah Huxley, MD;  Location: [facility] CV LAB;  Service: Cardiovascular;  Laterality: N/A;  . TUBAL LIGATION  03/1986    HEMATOLOGY/ONCOLOGY HISTORY:  Oncology History  Malignant neoplasm of lower lobe of right lung ([place])  09/20/2019 Initial Diagnosis   Malignant neoplasm of lower lobe of right lung ([place])   10/05/2019 Cancer Staging   Staging form: Lung, AJCC 8th Edition - Clinical stage from 10/05/2019: Stage IV (cT2, cN2, cM1) - Signed by Deborah Guadeloupe, MD on 10/08/2019   11/12/2019 -  Chemotherapy   The patient had palonosetron (ALOXI) injection 0.25 mg, 0.25 mg, Intravenous,  Once, 3 of 4 cycles Administration: 0.25 mg (11/12/2019), 0.25 mg (12/03/2019) pegfilgrastim-cbqv (UDENYCA) injection 6 mg, 6 mg, Subcutaneous, Once, 3 of 4 cycles Administration: 6 mg (11/13/2019), 6 mg (12/04/2019) CARBOplatin (PARAPLATIN) 500 mg in sodium chloride  0.9 % 250 mL chemo infusion, 500 mg (100 % of original dose 500 mg), Intravenous,  Once, 3 of 4 cycles Dose modification:   (original dose 487.5 mg, Cycle 1), 500 mg (original dose 500 mg, Cycle 1, Reason: Patient Age, Comment: per md) Administration: 500 mg (12/03/2019), 500 mg (11/12/2019) PACLitaxel (TAXOL) 324 mg in sodium chloride 0.9 % 500 mL chemo infusion (> 80mg /m2), 200  mg/m2 = 324 mg, Intravenous,  Once, 3 of 4 cycles Dose modification: 175 mg/m2 (original dose 200 mg/m2, Cycle 3, Reason: Other (see comments), Comment: neuropathy) Administration: 324 mg (11/12/2019), 324 mg (12/03/2019) pembrolizumab (KEYTRUDA) 200 mg in sodium chloride 0.9 % 50 mL chemo infusion, 200 mg, Intravenous, Once, 2 of 5 cycles Administration: 200 mg (11/12/2019) fosaprepitant (EMEND) 150 mg, dexamethasone (DECADRON) 12 mg in sodium chloride 0.9 % 145 mL IVPB, , Intravenous,  Once, 3 of 4 cycles Administration:  (11/12/2019),  (12/03/2019)  for chemotherapy treatment.      ALLERGIES:  is allergic to paxil [paroxetine hcl]; ciprofloxacin; levaquin [levofloxacin]; propofol; serzone [nefazodone]; biaxin [clarithromycin]; erythromycin; and penicillins.  MEDICATIONS:  Current Outpatient Medications  Medication Sig Dispense Refill  . acetaminophen (TYLENOL) 325 MG tablet Take 650 mg by mouth every 6 (six) hours as needed.    . ALPRAZolam (XANAX) 0.25 MG tablet Take 0.25 mg by mouth at bedtime as needed for anxiety.    Marland Kitchen apixaban (ELIQUIS) 5 MG TABS tablet Take 1 tablet (5 mg total) by mouth 2 (two) times daily. 60 tablet 2  . citalopram (CELEXA) 40 MG tablet Take 1 tablet (40 mg total) by mouth daily. 30 tablet 3  . dexamethasone (DECADRON) 4 MG tablet Take 2 tablets (8 mg total) by mouth daily. Start the day after carboplatin chemotherapy for 3 days. 30 tablet 1  . fentaNYL (DURAGESIC) 12 MCG/HR Place 1 patch onto the skin every 3 (three) days. 10 patch 0  . fluticasone (FLONASE) 50 MCG/ACT nasal spray Place 2 sprays into both nostrils daily as needed.     . lidocaine-prilocaine (EMLA) cream Apply to affected area once 30 g 3  . magic mouthwash SOLN Take 5 mLs by mouth 3 (three) times daily as needed for mouth pain. 300 mL 1  . methimazole (TAPAZOLE) 5 MG tablet Take 5 mg by mouth daily.    . metoprolol succinate (TOPROL-XL) 25 MG 24 hr tablet Take 12.5 tablets by mouth daily.    Marland Kitchen  nystatin (MYCOSTATIN) 100000 UNIT/ML suspension Take 5 mLs (500,000 Units total) by mouth 4 (four) times daily. Swish and spit. 300 mL 0  . OLANZapine (ZYPREXA) 10 MG tablet Take 1 tablet (10 mg total) by mouth at bedtime. 30 tablet 1  . ondansetron (ZOFRAN) 8 MG tablet Take 1 tablet (8 mg total) by mouth 2 (two) times daily as needed for refractory nausea / vomiting. Start on day 3 after carboplatin chemo. 30 tablet 1  . ondansetron (ZOFRAN-ODT) 8 MG disintegrating tablet Take 1 tablet (8 mg total) by mouth every 8 (eight) hours as needed for nausea or vomiting. 90 tablet 3  . Oxycodone HCl 10 MG TABS Take 1 tablet (10 mg total) by mouth every 4 (four) hours as needed. 60 tablet 0  . pantoprazole (PROTONIX) 20 MG tablet TAKE 1 TABLET BY MOUTH 2 TIMES DAILY 120 tablet 0  . prochlorperazine (COMPAZINE) 10 MG tablet Take 1 tablet (10 mg total) by mouth every 6 (six) hours as needed (Nausea or vomiting). 30 tablet 1  . sucralfate (CARAFATE) 1 g  tablet Take 1 tablet (1 g total) by mouth 3 (three) times daily. Dissolve in 3-4 tbsp warm water, swish and swallow. 90 tablet 3  . vitamin B-12 (CYANOCOBALAMIN) 1000 MCG tablet Take 1 tablet (1,000 mcg total) by mouth daily. 30 tablet 1   No current facility-administered medications for this visit.   Facility-Administered Medications Ordered in Other Visits  Medication Dose Route Frequency Provider Last Rate Last Admin  . CARBOplatin (PARAPLATIN) 500 mg in sodium chloride 0.9 % 250 mL chemo infusion  500 mg Intravenous Once Deborah Guadeloupe, MD      . heparin lock flush 100 unit/mL  500 Units Intravenous Once Deborah Guadeloupe, MD      . heparin lock flush 100 unit/mL  500 Units Intracatheter Once PRN Deborah Guadeloupe, MD      . PACLitaxel (TAXOL) 282 mg in sodium chloride 0.9 % 250 mL chemo infusion (> 80mg /m2)  175 mg/m2 (Treatment Plan Recorded) Intravenous Once Deborah Guadeloupe, MD      . pembrolizumab [facility]) 200 mg in sodium chloride 0.9 % 50 mL chemo infusion   200 mg Intravenous Once Deborah Guadeloupe, MD      . sodium chloride flush (NS) 0.9 % injection 10 mL  10 mL Intravenous Once Deborah Guadeloupe, MD        VITAL SIGNS: LMP 12/15/2010 (Approximate)  There were no vitals filed for this visit.  Estimated body mass index is 23.86 kg/m as calculated from the following:   Height as of an earlier encounter on 12/24/19: 5\' 4"  (1.626 m).   Weight as of an earlier encounter on 12/24/19: 139 lb (63 kg).  LABS: CBC:    Component Value Date/Time   WBC 6.7 12/24/2019 0908   HGB 10.2 (L) 12/24/2019 0908   HGB 13.5 11/07/2013 2128   HCT 33.1 (L) 12/24/2019 0908   HCT 39.2 11/07/2013 2128   PLT 339 12/24/2019 0908   PLT 282 11/07/2013 2128   MCV 96.8 12/24/2019 0908   MCV 96 11/07/2013 2128   NEUTROABS 5.3 12/24/2019 0908   NEUTROABS 6.8 (H) 05/24/2013 1340   LYMPHSABS 0.6 (L) 12/24/2019 0908   LYMPHSABS 1.6 05/24/2013 1340   MONOABS 0.7 12/24/2019 0908   MONOABS 0.6 05/24/2013 1340   EOSABS 0.0 12/24/2019 0908   EOSABS 0.0 05/24/2013 1340   BASOSABS 0.1 12/24/2019 0908   BASOSABS 0.0 05/24/2013 1340   Comprehensive Metabolic Panel:    Component Value Date/Time   NA 139 12/24/2019 0908   NA 140 11/07/2013 2128   K 3.8 12/24/2019 0908   K 4.2 11/07/2013 2128   CL 102 12/24/2019 0908   CL 107 11/07/2013 2128   CO2 28 12/24/2019 0908   CO2 29 11/07/2013 2128   BUN 11 12/24/2019 0908   BUN 21 (H) 11/07/2013 2128   CREATININE 0.38 (L) 12/24/2019 0908   CREATININE 0.50 (L) 11/07/2013 2128   GLUCOSE 107 (H) 12/24/2019 0908   GLUCOSE 107 (H) 11/07/2013 2128   CALCIUM 9.0 12/24/2019 0908   CALCIUM 9.3 11/07/2013 2128   AST 21 12/24/2019 0908   AST 48 (H) 11/07/2013 2128   ALT 23 12/24/2019 0908   ALT 60 11/07/2013 2128   ALKPHOS 96 12/24/2019 0908   ALKPHOS 92 11/07/2013 2128   BILITOT 0.5 12/24/2019 0908   BILITOT 0.2 11/07/2013 2128   PROT 6.9 12/24/2019 0908   PROT 7.5 11/07/2013 2128   ALBUMIN 3.4 (L) 12/24/2019 0908   ALBUMIN  3.6 11/07/2013 2128  RADIOGRAPHIC STUDIES: CT Chest W Contrast  Result Date: 12/20/2019 CLINICAL DATA:  Lung cancer.  Restaging. EXAM: CT CHEST, ABDOMEN, AND PELVIS WITH CONTRAST TECHNIQUE: Multidetector CT imaging of the chest, abdomen and pelvis was performed following the standard protocol during bolus administration of intravenous contrast. CONTRAST:  126mL OMNIPAQUE IOHEXOL 300 MG/ML  SOLN COMPARISON:  Abdomen/pelvis CT 10/03/2019. PET-CT 09/20/2019. Chest CT 06/16/2009. FINDINGS: CT CHEST FINDINGS Cardiovascular: The heart size is normal. No substantial pericardial effusion. Atherosclerotic calcification is noted in the wall of the thoracic aorta. Right Port-A-Cath tip is positioned in the upper right atrium. Mediastinum/Nodes: Stable 14 mm left thyroid nodule since 06/16/2009, compatible with benign etiology. No mediastinal lymphadenopathy. 10 mm short axis subcarinal node on 27/2 is upper normal for size. There is no hilar lymphadenopathy. The esophagus has normal imaging features. There is no axillary lymphadenopathy. Lungs/Pleura: Centrilobular emphsyema noted.Right lower lobe pulmonary mass is smaller today measuring 1.8 x 2.1 cm compared to chest CT 06/16/2009. No new pulmonary nodule or mass. No effusion. Musculoskeletal: No worrisome lytic or sclerotic osseous abnormality. CT ABDOMEN PELVIS FINDINGS Hepatobiliary: No suspicious focal abnormality within the liver parenchyma. A tiny hypodensity in the right liver is stable in the interval. There is no evidence for gallstones, gallbladder wall thickening, or pericholecystic fluid. No intrahepatic or extrahepatic biliary dilation. Pancreas: No focal mass lesion. No dilatation of the main duct. No intraparenchymal cyst. No peripancreatic edema. Spleen: No splenomegaly. No focal mass lesion. Adrenals/Urinary Tract: Right lower lobe pulmonary mass is smaller today measuring 1.8 x 2.1 cm compared to 3.6 x 3.1 cm on the previous PET-CT. The left adrenal  lesion is 3.3 x 2.4 cm today which compares to 3.3 x 2.4 cm when I remeasure in a similar fashion on the previous study. Stable right renal cyst. Kidneys otherwise unremarkable. No evidence for hydroureter. The urinary bladder appears normal for the degree of distention. Stomach/Bowel: Stomach is unremarkable. No gastric wall thickening. No evidence of outlet obstruction. Duodenum is normally positioned as is the ligament of Treitz. No small bowel wall thickening. No small bowel dilatation. The terminal ileum is normal. The appendix is normal. No gross colonic mass. No colonic wall thickening. Vascular/Lymphatic: There is abdominal aortic atherosclerosis without aneurysm. There is no gastrohepatic or hepatoduodenal ligament lymphadenopathy. No retroperitoneal or mesenteric lymphadenopathy. No pelvic sidewall lymphadenopathy. Reproductive: The uterus is unremarkable.  There is no adnexal mass. Other: No intraperitoneal free fluid. Musculoskeletal: No worrisome lytic or sclerotic osseous abnormality. Superior endplate compression deformity at T12 is stable. IMPRESSION: 1. Interval decrease in size of the right lower lobe pulmonary mass with of all vein adjacent radiation fibrosis. 2. Interval decrease in size of the right adrenal metastatic lesion with similar appearance of the left adrenal metastasis. 3. No new or progressive findings in the chest, abdomen, or pelvis on today's study to suggest new sites of metastatic involvement. 4.  Aortic Atherosclerois (ICD10-170.0) Electronically Signed   By: Misty Stanley M.D.   On: 12/20/2019 14:45   CT Abdomen Pelvis W Contrast  Result Date: 12/20/2019 CLINICAL DATA:  Lung cancer.  Restaging. EXAM: CT CHEST, ABDOMEN, AND PELVIS WITH CONTRAST TECHNIQUE: Multidetector CT imaging of the chest, abdomen and pelvis was performed following the standard protocol during bolus administration of intravenous contrast. CONTRAST:  148mL OMNIPAQUE IOHEXOL 300 MG/ML  SOLN COMPARISON:   Abdomen/pelvis CT 10/03/2019. PET-CT 09/20/2019. Chest CT 06/16/2009. FINDINGS: CT CHEST FINDINGS Cardiovascular: The heart size is normal. No substantial pericardial effusion. Atherosclerotic calcification is noted in the wall  of the thoracic aorta. Right Port-A-Cath tip is positioned in the upper right atrium. Mediastinum/Nodes: Stable 14 mm left thyroid nodule since 06/16/2009, compatible with benign etiology. No mediastinal lymphadenopathy. 10 mm short axis subcarinal node on 27/2 is upper normal for size. There is no hilar lymphadenopathy. The esophagus has normal imaging features. There is no axillary lymphadenopathy. Lungs/Pleura: Centrilobular emphsyema noted.Right lower lobe pulmonary mass is smaller today measuring 1.8 x 2.1 cm compared to chest CT 06/16/2009. No new pulmonary nodule or mass. No effusion. Musculoskeletal: No worrisome lytic or sclerotic osseous abnormality. CT ABDOMEN PELVIS FINDINGS Hepatobiliary: No suspicious focal abnormality within the liver parenchyma. A tiny hypodensity in the right liver is stable in the interval. There is no evidence for gallstones, gallbladder wall thickening, or pericholecystic fluid. No intrahepatic or extrahepatic biliary dilation. Pancreas: No focal mass lesion. No dilatation of the main duct. No intraparenchymal cyst. No peripancreatic edema. Spleen: No splenomegaly. No focal mass lesion. Adrenals/Urinary Tract: Right lower lobe pulmonary mass is smaller today measuring 1.8 x 2.1 cm compared to 3.6 x 3.1 cm on the previous PET-CT. The left adrenal lesion is 3.3 x 2.4 cm today which compares to 3.3 x 2.4 cm when I remeasure in a similar fashion on the previous study. Stable right renal cyst. Kidneys otherwise unremarkable. No evidence for hydroureter. The urinary bladder appears normal for the degree of distention. Stomach/Bowel: Stomach is unremarkable. No gastric wall thickening. No evidence of outlet obstruction. Duodenum is normally positioned as is the  ligament of Treitz. No small bowel wall thickening. No small bowel dilatation. The terminal ileum is normal. The appendix is normal. No gross colonic mass. No colonic wall thickening. Vascular/Lymphatic: There is abdominal aortic atherosclerosis without aneurysm. There is no gastrohepatic or hepatoduodenal ligament lymphadenopathy. No retroperitoneal or mesenteric lymphadenopathy. No pelvic sidewall lymphadenopathy. Reproductive: The uterus is unremarkable.  There is no adnexal mass. Other: No intraperitoneal free fluid. Musculoskeletal: No worrisome lytic or sclerotic osseous abnormality. Superior endplate compression deformity at T12 is stable. IMPRESSION: 1. Interval decrease in size of the right lower lobe pulmonary mass with of all vein adjacent radiation fibrosis. 2. Interval decrease in size of the right adrenal metastatic lesion with similar appearance of the left adrenal metastasis. 3. No new or progressive findings in the chest, abdomen, or pelvis on today's study to suggest new sites of metastatic involvement. 4.  Aortic Atherosclerois (ICD10-170.0) Electronically Signed   By: Misty Stanley M.D.   On: 12/20/2019 14:45    PERFORMANCE STATUS (ECOG) : 2 - Symptomatic, <50% confined to bed  Review of Systems Unless otherwise noted, a complete review of systems is negative.  Physical Exam General: NAD, frail appearing, thin Pulmonary: unlabored Skin: no rashes Neurological: Weakness but otherwise nonfocal  IMPRESSION: Routine follow-up visit made today.  Patient was seen in the infusion area.  Patient reports she is doing slightly better than when I spoke with her on 1/15.  She says pain is improved.  We again discussed use of transdermal fentanyl although patient has yet to start it.  Hopefully, it would reduce pill burden with round-the-clock oxycodone.  Home health is following.  Patient thinks that she was seen by Education officer, museum but is unsure of the plan.  I called and requested the  social worker call me.  No other significant changes reported.   PLAN: -Continue current scope of treatment -Continue oxycodone 10 mg every 4 hours PRN -Recommend transdermal fentanyl -Home health following -Follow-up telephone visit in 2 to 3 weeks  Patient expressed understanding and was in agreement with this plan. She also understands that She can call the clinic at any time with any questions, concerns, or complaints.     Time Total: 15 minutes  Visit consisted of counseling and education dealing with the complex and emotionally intense issues of symptom management and palliative care in the setting of serious and potentially life-threatening illness.Greater than 50%  of this time was spent counseling and coordinating care related to the above assessment and plan.  Signed by: Altha Harm, PhD, NP-C

## 2019-12-24 NOTE — Progress Notes (Signed)
Radiation Oncology Follow up Note  Name: Deborah Nguyen   Date:   12/24/2019 MRN:  [id number] DOB: 01/01/1961    This 59 y.o. 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  She is seen today in infusion chair currently undergoing carbotaxol therapy.  She specifically denies any change in neurologic status no headaches change in visual fields or any motor or sensory loss.  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  She did have a recent CT scan of her chest which I reviewed shows decreased size in the right lower lobe pulmonary mass and decreased size in the right adrenal metastatic lesion.  COMPLICATIONS OF TREATMENT: none  FOLLOW UP COMPLIANCE: keeps appointments   PHYSICAL EXAM:  LMP 12/15/2010 (Approximate)  Well-developed well-nourished patient in NAD. HEENT reveals PERLA, EOMI, discs not visualized.  Oral cavity is clear. No oral mucosal lesions are identified. Neck is clear without evidence of cervical or supraclavicular adenopathy. Lungs are clear to A&P. Cardiac examination is essentially unremarkable with regular rate and rhythm without murmur rub or thrill. Abdomen is benign with no organomegaly or masses noted. Motor sensory and DTR levels are equal and symmetric in the upper and lower extremities. Cranial nerves II through XII are grossly intact. Proprioception is intact. No peripheral adenopathy or edema is identified. No motor or sensory levels are noted. Crude visual fields are within normal range.  RADIOLOGY RESULTS: CT scans reviewed compatible with above-stated findings  PLAN:  At this time will turn follow-up care over to medical oncology.  Would be happy to reevaluate the patient anytime should further palliative treatment be indicated.  She is stable at the current time receiving carbotaxol therapy.  Patient knows to call with any concerns.  I would like to take this opportunity to thank you for allowing me to participate in the care of your patient.Noreene Filbert, MD

## 2019-12-25 ENCOUNTER — Encounter: Payer: Medicare Other | Admitting: Hospice and Palliative Medicine

## 2019-12-25 ENCOUNTER — Other Ambulatory Visit: Payer: Self-pay

## 2019-12-25 ENCOUNTER — Inpatient Hospital Stay: Payer: Medicare Other

## 2019-12-25 ENCOUNTER — Ambulatory Visit: Payer: Medicare Other

## 2019-12-25 NOTE — Progress Notes (Signed)
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 She presented to Dr. Wilburn Cornelia symptoms of right flank pain which was radiating to her front which prompted a CT abdomen. CT abdomen pelvis with contrast showed a 3.7 x 2.8 x 5 cm right adrenal mass. 1.7 x 1.2 x 2.1 cm left adrenal nodule 1.8 cm right renal simple cyst. And a new 3.6 x 3.2 cm spiculated mass in the right lower lobe with tethering of the adjacent pleura. Prominent hilar lymph nodes. This was followed by a PET CT scan on 09/20/2019 which showed a 3.6 cm right lower lobe mass which was markedly hypermetabolic along with hypermetabolic metastatic disease in the right supraclavicular space as well as mediastinum and right hilum and hypermetabolic metastatic disease in bilateral adrenal glands. She will also noted to have bilateral hypermetabolic thyroid nodules and a possible nodule in the pelvis adjacent to the sigmoid colon.  Patient currently reports ongoing pain in her right flank which is relatively well controlled with hydrocodone. She also has occasional nausea for which she has as needed nausea medications. She is here with her twin sister today and is highly anxious Of note patient has a history of central core myopathy and follows up with neuromuscular clinic at [facility]. Patient states that her twin sister has had episodes of hyperthermia but patient herself has not  had these episodes. I did review [facility] neuromuscular clinic note and the hyperthermia has been attributed to her history of hyperthyroidism and thyroid dysfunction rather than malignant hyperthermia. However given her history of myopathy patient does carry risk of malignant hyperthermia.  Not enough tissue was available for NGS testing. Patient therefore underwentLiquid biopsy which showed evidence of PI K3 CA amplification, RIC POR amplification, T p53  Patient developed significant scalp and body rash after cycle 1 of carbotaxol Keytruda.  This resolved after a trial of steroids and Keytruda was held for cycle 2.  Interim scans after 2 cycles showed reduction in the size of the right lower lobe mass as well as some reduction in the adrenal metastases as well.  No new progressive disease  Interval history-patient continues to have significant nausea after chemotherapy but has not started taking Zyprexa yet.  She also uses her breakthrough pain medicine for her abdominal and flank pain every 4 hours but did not start her fentanyl patch here.  Reports ongoing fatigue and problems with balance.  Denies other complaints  ECOG PS- 2 Pain scale- 4 Opioid associated constipation- no  Review of systems- Review of Systems  Constitutional: Positive for malaise/fatigue. Negative for chills, fever and weight loss.  HENT: Negative for congestion, ear discharge and nosebleeds.   Eyes: Negative for blurred vision.  Respiratory: Negative for cough, hemoptysis, sputum production, shortness of breath and wheezing.   Cardiovascular: Negative for chest pain, palpitations, orthopnea and claudication.  Gastrointestinal: Positive for abdominal pain and nausea. Negative for blood in stool, constipation, diarrhea, heartburn, melena and vomiting.  Genitourinary: Negative for dysuria,  flank pain, frequency, hematuria and urgency.  Musculoskeletal: Negative for back pain, joint pain and myalgias.  Skin: Negative for  rash.  Neurological: Negative for dizziness, tingling, focal weakness, seizures, weakness and headaches.  Endo/Heme/Allergies: Does not bruise/bleed easily.  Psychiatric/Behavioral: Negative for depression and suicidal ideas. The patient does not have insomnia.       Allergies  Allergen Reactions  . Paxil [Paroxetine Hcl] Other (See Comments)    Hallucinations   . Ciprofloxacin Other (See Comments)    Unknown  . Levaquin [Levofloxacin] Other (See Comments)    Unknown   . 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 She has an increase risk of malignant hyperthermia with anesthesia. She should not receive Propofol.   Donne Hazel [Nefazodone] Other (See Comments)    Unknown   . Biaxin [Clarithromycin] Nausea Only  . Erythromycin Nausea Only  . Penicillins Rash     Past Medical History:  Diagnosis Date  . Anxiety   . Arthritis   . Chronic kidney disease   . Family history of breast cancer    aunt  . GERD (gastroesophageal reflux disease)   . History of DVT of lower extremity    Left leg  . Lung cancer ([place])    with brain mets  . Lung mass   . Malignant hyperthermia   . 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 Muscular dystrophy [facility])      Past Surgical History:  Procedure Laterality Date  . CESAREAN SECTION  11/1985  . COLONOSCOPY N/A 10/27/2015   Procedure: COLONOSCOPY;  Surgeon: Manya Silvas, MD;  Location: [facility] ENDOSCOPY;  Service: Endoscopy;  Laterality: N/A;   NO Propofol - per office  . LITHOTRIPSY    . PORTA CATH INSERTION N/A 11/09/2019   Procedure: PORTA CATH INSERTION;  Surgeon: Algernon Huxley, MD;  Location: [place] CV LAB;  Service: Cardiovascular;  Laterality: N/A;  . TUBAL LIGATION  03/1986    Social History   Socioeconomic History  . Marital status: Divorced    Spouse name: Not on file  . Number of children: Not on file  . Years of education:  Not on file  . Highest education level: Not on file  Occupational History  . Not on file  Tobacco Use  . Smoking status: Former Smoker    Packs/day: 1.00    Types: Cigarettes    Quit date: 05/2016    Years since quitting: 3.6  . Smokeless tobacco: Never Used  Substance and Sexual Activity  . Alcohol use: No  . Drug use: No  . Sexual activity: Not on file  Other Topics Concern  . Not on file  Social History Narrative  . Not on file   Social Determinants of Health   Financial Resource Strain:   . Difficulty of Paying Living Expenses: Not on file  Food Insecurity:   . Worried About Charity fundraiser in the Last Year: Not on file  . Ran Out of Food in the Last Year: Not on file  Transportation Needs:   . Lack of Transportation (Medical): Not on file  . Lack of Transportation (Non-Medical): Not on file  Physical Activity:   . Days of Exercise per Week: Not on file  . Minutes of Exercise per Session: Not on file  Stress:   . Feeling of Stress : Not on file  Social  Connections:   . Frequency of Communication with Friends and Family: Not on file  . Frequency of Social Gatherings with Friends and Family: Not on file  . Attends Religious Services: Not on file  . Active Member of Clubs or Organizations: Not on file  . Attends Archivist Meetings: Not on file  . Marital Status: Not on file  Intimate Partner Violence:   . Fear of Current or Ex-Partner: Not on file  . Emotionally Abused: Not on file  . Physically Abused: Not on file  . Sexually Abused: Not on file    Family History  Problem Relation Age of Onset  . Cancer Father        bladder, lung  . Breast cancer Maternal Aunt 45     Current Outpatient Medications:  .  acetaminophen (TYLENOL) 325 MG tablet, Take 650 mg by mouth every 6 (six) hours as needed., Disp: , Rfl:  .  ALPRAZolam (XANAX) 0.25 MG tablet, Take 0.25 mg by mouth at bedtime as needed for anxiety., Disp: , Rfl:  .  apixaban (ELIQUIS) 5 MG  TABS tablet, Take 1 tablet (5 mg total) by mouth 2 (two) times daily., Disp: 60 tablet, Rfl: 2 .  citalopram (CELEXA) 40 MG tablet, Take 1 tablet (40 mg total) by mouth daily., Disp: 30 tablet, Rfl: 3 .  dexamethasone (DECADRON) 4 MG tablet, Take 2 tablets (8 mg total) by mouth daily. Start the day after carboplatin chemotherapy for 3 days., Disp: 30 tablet, Rfl: 1 .  fentaNYL (DURAGESIC) 12 MCG/HR, Place 1 patch onto the skin every 3 (three) days., Disp: 10 patch, Rfl: 0 .  fluticasone (FLONASE) 50 MCG/ACT nasal spray, Place 2 sprays into both nostrils daily as needed. , Disp: , Rfl:  .  lidocaine-prilocaine (EMLA) cream, Apply to affected area once, Disp: 30 g, Rfl: 3 .  magic mouthwash SOLN, Take 5 mLs by mouth 3 (three) times daily as needed for mouth pain., Disp: 300 mL, Rfl: 1 .  methimazole (TAPAZOLE) 5 MG tablet, Take 5 mg by mouth daily., Disp: , Rfl:  .  metoprolol succinate (TOPROL-XL) 25 MG 24 hr tablet, Take 12.5 tablets by mouth daily., Disp: , Rfl:  .  nystatin (MYCOSTATIN) 100000 UNIT/ML suspension, Take 5 mLs (500,000 Units total) by mouth 4 (four) times daily. Swish and spit., Disp: 300 mL, Rfl: 0 .  OLANZapine (ZYPREXA) 10 MG tablet, Take 1 tablet (10 mg total) by mouth at bedtime., Disp: 30 tablet, Rfl: 1 .  ondansetron (ZOFRAN) 8 MG tablet, Take 1 tablet (8 mg total) by mouth 2 (two) times daily as needed for refractory nausea / vomiting. Start on day 3 after carboplatin chemo., Disp: 30 tablet, Rfl: 1 .  ondansetron (ZOFRAN-ODT) 8 MG disintegrating tablet, Take 1 tablet (8 mg total) by mouth every 8 (eight) hours as needed for nausea or vomiting., Disp: 90 tablet, Rfl: 3 .  Oxycodone HCl 10 MG TABS, Take 1 tablet (10 mg total) by mouth every 4 (four) hours as needed., Disp: 60 tablet, Rfl: 0 .  pantoprazole (PROTONIX) 20 MG tablet, TAKE 1 TABLET BY MOUTH 2 TIMES DAILY, Disp: 120 tablet, Rfl: 0 .  prochlorperazine (COMPAZINE) 10 MG tablet, Take 1 tablet (10 mg total) by mouth  every 6 (six) hours as needed (Nausea or vomiting)., Disp: 30 tablet, Rfl: 1 .  sucralfate (CARAFATE) 1 g tablet, Take 1 tablet (1 g total) by mouth 3 (three) times daily. Dissolve in 3-4 tbsp warm water, swish and swallow.,  Disp: 90 tablet, Rfl: 3 .  vitamin B-12 (CYANOCOBALAMIN) 1000 MCG tablet, Take 1 tablet (1,000 mcg total) by mouth daily., Disp: 30 tablet, Rfl: 1 .  predniSONE (DELTASONE) 10 MG tablet, Take 1 tablet (10 mg total) by mouth as directed. If develops rash-5 tablets day x 2 days, 4 tablets daily x 2 days, 3 tablets daily x 2 days, 2 tablets daily x 2 days , 1 tablet daily x 2 days, stop.  Take with food., Disp: 30 tablet, Rfl: 0  Physical exam:  Vitals:   12/24/19 0931  BP: 119/65  Pulse: 75  Temp: 97.9 F (36.6 C)  TempSrc: Tympanic  SpO2: 100%  Weight: 139 lb (63 kg)  Height: 5' 4" (1.626 m)   Physical Exam Constitutional:      Comments: She is sitting in a wheelchair and appears fatigued  HENT:     Head: Normocephalic and atraumatic.  Eyes:     Pupils: Pupils are equal, round, and reactive to light.  Cardiovascular:     Rate and Rhythm: Normal rate and regular rhythm.     Heart sounds: Normal heart sounds.  Pulmonary:     Effort: Pulmonary effort is normal.     Breath sounds: Normal breath sounds.  Abdominal:     General: Bowel sounds are normal.     Palpations: Abdomen is soft.     Comments: Mild TTP over right flank  Musculoskeletal:     Cervical back: Normal range of motion.     Comments: Trace bilateral edema  Skin:    General: Skin is warm and dry.  Neurological:     Mental Status: She is alert and oriented to person, place, and time.      CMP Latest Ref Rng & Units 12/24/2019  Glucose 70 - 99 mg/dL 107(H)  BUN 6 - 20 mg/dL 11  Creatinine 0.44 - 1.00 mg/dL 0.38(L)  Sodium 135 - 145 mmol/L 139  Potassium 3.5 - 5.1 mmol/L 3.8  Chloride 98 - 111 mmol/L 102  CO2 22 - 32 mmol/L 28  Calcium 8.9 - 10.3 mg/dL 9.0  Total Protein 6.5 - 8.1 g/dL 6.9    Total Bilirubin 0.3 - 1.2 mg/dL 0.5  Alkaline Phos 38 - 126 U/L 96  AST 15 - 41 U/L 21  ALT 0 - 44 U/L 23   CBC Latest Ref Rng & Units 12/24/2019  WBC 4.0 - 10.5 K/uL 6.7  Hemoglobin 12.0 - 15.0 g/dL 10.2(L)  Hematocrit 36.0 - 46.0 % 33.1(L)  Platelets 150 - 400 K/uL 339    No images are attached to the encounter.  CT Chest W Contrast  Result Date: 12/20/2019 CLINICAL DATA:  Lung cancer.  Restaging. EXAM: CT CHEST, ABDOMEN, AND PELVIS WITH CONTRAST TECHNIQUE: Multidetector CT imaging of the chest, abdomen and pelvis was performed following the standard protocol during bolus administration of intravenous contrast. CONTRAST:  15m OMNIPAQUE IOHEXOL 300 MG/ML  SOLN COMPARISON:  Abdomen/pelvis CT 10/03/2019. PET-CT 09/20/2019. Chest CT 06/16/2009. FINDINGS: CT CHEST FINDINGS Cardiovascular: The heart size is normal. No substantial pericardial effusion. Atherosclerotic calcification is noted in the wall of the thoracic aorta. Right Port-A-Cath tip is positioned in the upper right atrium. Mediastinum/Nodes: Stable 14 mm left thyroid nodule since 06/16/2009, compatible with benign etiology. No mediastinal lymphadenopathy. 10 mm short axis subcarinal node on 27/2 is upper normal for size. There is no hilar lymphadenopathy. The esophagus has normal imaging features. There is no axillary lymphadenopathy. Lungs/Pleura: Centrilobular emphsyema noted.Right lower lobe pulmonary mass  is smaller today measuring 1.8 x 2.1 cm compared to chest CT 06/16/2009. No new pulmonary nodule or mass. No effusion. Musculoskeletal: No worrisome lytic or sclerotic osseous abnormality. CT ABDOMEN PELVIS FINDINGS Hepatobiliary: No suspicious focal abnormality within the liver parenchyma. A tiny hypodensity in the right liver is stable in the interval. There is no evidence for gallstones, gallbladder wall thickening, or pericholecystic fluid. No intrahepatic or extrahepatic biliary dilation. Pancreas: No focal mass lesion. No  dilatation of the main duct. No intraparenchymal cyst. No peripancreatic edema. Spleen: No splenomegaly. No focal mass lesion. Adrenals/Urinary Tract: Right lower lobe pulmonary mass is smaller today measuring 1.8 x 2.1 cm compared to 3.6 x 3.1 cm on the previous PET-CT. The left adrenal lesion is 3.3 x 2.4 cm today which compares to 3.3 x 2.4 cm when I remeasure in a similar fashion on the previous study. Stable right renal cyst. Kidneys otherwise unremarkable. No evidence for hydroureter. The urinary bladder appears normal for the degree of distention. Stomach/Bowel: Stomach is unremarkable. No gastric wall thickening. No evidence of outlet obstruction. Duodenum is normally positioned as is the ligament of Treitz. No small bowel wall thickening. No small bowel dilatation. The terminal ileum is normal. The appendix is normal. No gross colonic mass. No colonic wall thickening. Vascular/Lymphatic: There is abdominal aortic atherosclerosis without aneurysm. There is no gastrohepatic or hepatoduodenal ligament lymphadenopathy. No retroperitoneal or mesenteric lymphadenopathy. No pelvic sidewall lymphadenopathy. Reproductive: The uterus is unremarkable.  There is no adnexal mass. Other: No intraperitoneal free fluid. Musculoskeletal: No worrisome lytic or sclerotic osseous abnormality. Superior endplate compression deformity at T12 is stable. IMPRESSION: 1. Interval decrease in size of the right lower lobe pulmonary mass with of all vein adjacent radiation fibrosis. 2. Interval decrease in size of the right adrenal metastatic lesion with similar appearance of the left adrenal metastasis. 3. No new or progressive findings in the chest, abdomen, or pelvis on today's study to suggest new sites of metastatic involvement. 4.  Aortic Atherosclerois (ICD10-170.0) Electronically Signed   By: Misty Stanley M.D.   On: 12/20/2019 14:45   CT Abdomen Pelvis W Contrast  Result Date: 12/20/2019 CLINICAL DATA:  Lung cancer.   Restaging. EXAM: CT CHEST, ABDOMEN, AND PELVIS WITH CONTRAST TECHNIQUE: Multidetector CT imaging of the chest, abdomen and pelvis was performed following the standard protocol during bolus administration of intravenous contrast. CONTRAST:  119m OMNIPAQUE IOHEXOL 300 MG/ML  SOLN COMPARISON:  Abdomen/pelvis CT 10/03/2019. PET-CT 09/20/2019. Chest CT 06/16/2009. FINDINGS: CT CHEST FINDINGS Cardiovascular: The heart size is normal. No substantial pericardial effusion. Atherosclerotic calcification is noted in the wall of the thoracic aorta. Right Port-A-Cath tip is positioned in the upper right atrium. Mediastinum/Nodes: Stable 14 mm left thyroid nodule since 06/16/2009, compatible with benign etiology. No mediastinal lymphadenopathy. 10 mm short axis subcarinal node on 27/2 is upper normal for size. There is no hilar lymphadenopathy. The esophagus has normal imaging features. There is no axillary lymphadenopathy. Lungs/Pleura: Centrilobular emphsyema noted.Right lower lobe pulmonary mass is smaller today measuring 1.8 x 2.1 cm compared to chest CT 06/16/2009. No new pulmonary nodule or mass. No effusion. Musculoskeletal: No worrisome lytic or sclerotic osseous abnormality. CT ABDOMEN PELVIS FINDINGS Hepatobiliary: No suspicious focal abnormality within the liver parenchyma. A tiny hypodensity in the right liver is stable in the interval. There is no evidence for gallstones, gallbladder wall thickening, or pericholecystic fluid. No intrahepatic or extrahepatic biliary dilation. Pancreas: No focal mass lesion. No dilatation of the main duct. No intraparenchymal cyst. No  peripancreatic edema. Spleen: No splenomegaly. No focal mass lesion. Adrenals/Urinary Tract: Right lower lobe pulmonary mass is smaller today measuring 1.8 x 2.1 cm compared to 3.6 x 3.1 cm on the previous PET-CT. The left adrenal lesion is 3.3 x 2.4 cm today which compares to 3.3 x 2.4 cm when I remeasure in a similar fashion on the previous study.  Stable right renal cyst. Kidneys otherwise unremarkable. No evidence for hydroureter. The urinary bladder appears normal for the degree of distention. Stomach/Bowel: Stomach is unremarkable. No gastric wall thickening. No evidence of outlet obstruction. Duodenum is normally positioned as is the ligament of Treitz. No small bowel wall thickening. No small bowel dilatation. The terminal ileum is normal. The appendix is normal. No gross colonic mass. No colonic wall thickening. Vascular/Lymphatic: There is abdominal aortic atherosclerosis without aneurysm. There is no gastrohepatic or hepatoduodenal ligament lymphadenopathy. No retroperitoneal or mesenteric lymphadenopathy. No pelvic sidewall lymphadenopathy. Reproductive: The uterus is unremarkable.  There is no adnexal mass. Other: No intraperitoneal free fluid. Musculoskeletal: No worrisome lytic or sclerotic osseous abnormality. Superior endplate compression deformity at T12 is stable. IMPRESSION: 1. Interval decrease in size of the right lower lobe pulmonary mass with of all vein adjacent radiation fibrosis. 2. Interval decrease in size of the right adrenal metastatic lesion with similar appearance of the left adrenal metastasis. 3. No new or progressive findings in the chest, abdomen, or pelvis on today's study to suggest new sites of metastatic involvement. 4.  Aortic Atherosclerois (ICD10-170.0) Electronically Signed   By: Misty Stanley M.D.   On: 12/20/2019 14:45     Assessment and plan- Patient is a 59 y.o. female  withsquamous cell carcinoma of the lung stage IV [id number] with adrenal and brain metastases.   She is here for on treatment assessment prior to cycle 3 of carbotaxol Keytruda chemotherapy  Patient developed significant skin rash after cycle 1 of carbotaxol and Keytruda.  She required a course of steroids and Keytruda was held for cycle 2.  Interim scans after 2 cycles show response to treatment and reduction in the size of the right lower  lobe mass as well as adrenal metastases.  I have reviewed CT chest abdomen and pelvis images independently and discussed findings with the patient.  I would like to rechallenge her with [facility] for cycle 3 and see if she tolerates it without developing a rash.  She is currently off steroids.  We will be sending her a course of steroids which she can take just in case she breaks out into another rash following Keytruda.  She does receive Udenyca on day 2 after chemotherapy.   Neoplasm related pain: I have encouraged the patient to please start taking the fentanyl patch in order to reduce the number of breakthrough doses that she is requiring for her pain.  Patient is willing to do that at this time  Nausea: She will continue as needed Compazine and Zofran but I have strongly recommended that she should try Zyprexa 10 mg at night to help with appetite and nausea.  Patient states she is willing to try at this time  I will see her back tentatively in 10 days to see how her pain and nausea is controlled and for possible IV fluids.   Visit Diagnosis 1. Encounter for antineoplastic chemotherapy   2. Encounter for antineoplastic immunotherapy   3. Malignant neoplasm of lower lobe of right lung ([place])   4. 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 Neoplasm  related pain   6. Chemotherapy induced nausea and vomiting      Dr. Randa Evens, MD, MPH [facility] [phone] 12/25/2019 9:01 AM

## 2019-12-26 ENCOUNTER — Telehealth: Payer: Self-pay | Admitting: *Deleted

## 2019-12-26 ENCOUNTER — Other Ambulatory Visit: Payer: Self-pay

## 2019-12-26 ENCOUNTER — Inpatient Hospital Stay: Payer: Medicare Other

## 2019-12-26 ENCOUNTER — Encounter: Payer: Self-pay | Admitting: *Deleted

## 2019-12-26 DIAGNOSIS — C3431 Malignant neoplasm of lower lobe, right bronchus or lung: Secondary | ICD-10-CM

## 2019-12-26 DIAGNOSIS — Z5111 Encounter for antineoplastic chemotherapy: Secondary | ICD-10-CM | POA: Diagnosis not present

## 2019-12-26 MED ORDER — PEGFILGRASTIM-CBQV 6 MG/0.6ML ~~LOC~~ SOSY
6.0000 mg | PREFILLED_SYRINGE | Freq: Once | SUBCUTANEOUS | Status: AC
  Administered 2019-12-26: 11:00:00 6 mg via SUBCUTANEOUS
  Filled 2019-12-26: qty 0.6

## 2019-12-26 NOTE — Progress Notes (Signed)
  Oncology Nurse Navigator Documentation  Navigator Location: [facility] (12/26/19 1100)   )Navigator Encounter Type: Lobby (12/26/19 1100)                         Barriers/Navigation Needs: No Barriers At This Time (12/26/19 1100)   Interventions: None Required (12/26/19 1100)           met with patient in the lobby to assess of any further barriers. No barriers identified at this time. Instructed pt to call if has any further questions or needs. Pt verbalized understanding.            Time Spent with Patient: 15 (12/26/19 1100)

## 2019-12-26 NOTE — Telephone Encounter (Signed)
Sister called reporting that patient is having swelling all over her body, it started in her faced and has spread and it she is now complaining that the back of her neck is starting to swell. Please advise

## 2019-12-26 NOTE — Telephone Encounter (Signed)
I called patient to follow up regarding her message. She says the swelling has been going on off and on for a month or possibly more. Today and yesterday she felt like symptoms were worse. I have reached out to Dr. Janese Banks to see if she wants to see her or wants her or to have her seen in [facility].

## 2019-12-26 NOTE — Telephone Encounter (Signed)
Appointment accepted with Dr Janese Banks for 930 tomorrow morning

## 2019-12-26 NOTE — Telephone Encounter (Signed)
Pt wanted Dr. Janese Banks to know that she is having neuropathy in her hands and feet. She states that it is tolerable but wanted Dr. Janese Banks to be aware. She couldn't remember if she has mentioned it at previous visits.

## 2019-12-27 ENCOUNTER — Emergency Department: Payer: Medicare Other

## 2019-12-27 ENCOUNTER — Emergency Department
Admission: EM | Admit: 2019-12-27 | Discharge: 2019-12-27 | Disposition: A | Discharge status: Home / Self Care | Payer: Medicare Other | Attending: Emergency Medicine | Admitting: Emergency Medicine

## 2019-12-27 ENCOUNTER — Encounter: Payer: Self-pay | Admitting: Emergency Medicine

## 2019-12-27 ENCOUNTER — Inpatient Hospital Stay: Payer: Medicare Other

## 2019-12-27 ENCOUNTER — Other Ambulatory Visit: Payer: Self-pay

## 2019-12-27 ENCOUNTER — Inpatient Hospital Stay (HOSPITAL_BASED_OUTPATIENT_CLINIC_OR_DEPARTMENT_OTHER): Payer: Medicare Other | Admitting: Oncology

## 2019-12-27 ENCOUNTER — Inpatient Hospital Stay: Payer: Medicare Other | Admitting: Oncology

## 2019-12-27 VITALS — BP 116/76 | HR 93 | Temp 100.9°F | Resp 16

## 2019-12-27 DIAGNOSIS — R509 Fever, unspecified: Secondary | ICD-10-CM | POA: Insufficient documentation

## 2019-12-27 DIAGNOSIS — G8918 Other acute postprocedural pain: Secondary | ICD-10-CM | POA: Insufficient documentation

## 2019-12-27 DIAGNOSIS — Z87891 Personal history of nicotine dependence: Secondary | ICD-10-CM | POA: Insufficient documentation

## 2019-12-27 DIAGNOSIS — G893 Neoplasm related pain (acute) (chronic): Secondary | ICD-10-CM

## 2019-12-27 DIAGNOSIS — M2550 Pain in unspecified joint: Secondary | ICD-10-CM

## 2019-12-27 DIAGNOSIS — Z20822 Contact with and (suspected) exposure to covid-19: Secondary | ICD-10-CM | POA: Insufficient documentation

## 2019-12-27 DIAGNOSIS — E860 Dehydration: Secondary | ICD-10-CM

## 2019-12-27 DIAGNOSIS — Z7901 Long term (current) use of anticoagulants: Secondary | ICD-10-CM | POA: Insufficient documentation

## 2019-12-27 DIAGNOSIS — C3431 Malignant neoplasm of lower lobe, right bronchus or lung: Secondary | ICD-10-CM

## 2019-12-27 DIAGNOSIS — Z9221 Personal history of antineoplastic chemotherapy: Secondary | ICD-10-CM | POA: Insufficient documentation

## 2019-12-27 DIAGNOSIS — C7971 Secondary malignant neoplasm of right adrenal gland: Secondary | ICD-10-CM | POA: Diagnosis not present

## 2019-12-27 DIAGNOSIS — Z79899 Other long term (current) drug therapy: Secondary | ICD-10-CM | POA: Diagnosis not present

## 2019-12-27 DIAGNOSIS — R0602 Shortness of breath: Secondary | ICD-10-CM | POA: Diagnosis present

## 2019-12-27 DIAGNOSIS — Z85118 Personal history of other malignant neoplasm of bronchus and lung: Secondary | ICD-10-CM | POA: Insufficient documentation

## 2019-12-27 DIAGNOSIS — N189 Chronic kidney disease, unspecified: Secondary | ICD-10-CM | POA: Insufficient documentation

## 2019-12-27 DIAGNOSIS — R112 Nausea with vomiting, unspecified: Secondary | ICD-10-CM | POA: Diagnosis not present

## 2019-12-27 DIAGNOSIS — E039 Hypothyroidism, unspecified: Secondary | ICD-10-CM | POA: Insufficient documentation

## 2019-12-27 DIAGNOSIS — Z86718 Personal history of other venous thrombosis and embolism: Secondary | ICD-10-CM | POA: Diagnosis not present

## 2019-12-27 DIAGNOSIS — C7972 Secondary malignant neoplasm of left adrenal gland: Secondary | ICD-10-CM

## 2019-12-27 DIAGNOSIS — T451X5A Adverse effect of antineoplastic and immunosuppressive drugs, initial encounter: Secondary | ICD-10-CM

## 2019-12-27 LAB — COMPREHENSIVE METABOLIC PANEL
ALT: 17 U/L (ref 0–44)
AST: 17 U/L (ref 15–41)
Albumin: 3.4 g/dL — ABNORMAL LOW (ref 3.5–5.0)
Alkaline Phosphatase: 111 U/L (ref 38–126)
Anion gap: 9 (ref 5–15)
BUN: 11 mg/dL (ref 6–20)
CO2: 27 mmol/L (ref 22–32)
Calcium: 8.7 mg/dL — ABNORMAL LOW (ref 8.9–10.3)
Chloride: 100 mmol/L (ref 98–111)
Creatinine, Ser: 0.37 mg/dL — ABNORMAL LOW (ref 0.44–1.00)
GFR calc Af Amer: >60 mL/min (ref >60)
GFR calc non Af Amer: >60 mL/min (ref >60)
Glucose, Bld: 75 mg/dL (ref 70–99)
Potassium: 4.1 mmol/L (ref 3.5–5.1)
Sodium: 136 mmol/L (ref 135–145)
Total Bilirubin: 1 mg/dL (ref 0.3–1.2)
Total Protein: 6.8 g/dL (ref 6.5–8.1)

## 2019-12-27 LAB — CBC WITH DIFFERENTIAL/PLATELET
Abs Immature Granulocytes: 3.49 10*3/uL — ABNORMAL HIGH (ref 0.00–0.07)
Basophils Absolute: 0 10*3/uL (ref 0.0–0.1)
Basophils Relative: 0 %
Eosinophils Absolute: 0 10*3/uL (ref 0.0–0.5)
Eosinophils Relative: 0 %
HCT: 34.9 % — ABNORMAL LOW (ref 36.0–46.0)
Hemoglobin: 10.9 g/dL — ABNORMAL LOW (ref 12.0–15.0)
Immature Granulocytes: 8 %
Lymphocytes Relative: 2 %
Lymphs Abs: 0.6 10*3/uL — ABNORMAL LOW (ref 0.7–4.0)
MCH: 30.2 pg (ref 26.0–34.0)
MCHC: 31.2 g/dL (ref 30.0–36.0)
MCV: 96.7 fL (ref 80.0–100.0)
Monocytes Absolute: 0.2 10*3/uL (ref 0.1–1.0)
Monocytes Relative: 0 %
Neutro Abs: 39.7 10*3/uL — ABNORMAL HIGH (ref 1.7–7.7)
Neutrophils Relative %: 90 %
Platelets: 331 10*3/uL (ref 150–400)
RBC: 3.61 MIL/uL — ABNORMAL LOW (ref 3.87–5.11)
RDW: 18.6 % — ABNORMAL HIGH (ref 11.5–15.5)
Smear Review: NORMAL
WBC: 44.1 10*3/uL — ABNORMAL HIGH (ref 4.0–10.5)
nRBC: 0 % (ref 0.0–0.2)

## 2019-12-27 LAB — LIPASE, BLOOD: Lipase: 21 U/L (ref 11–51)

## 2019-12-27 LAB — CK: Total CK: 8 U/L — ABNORMAL LOW (ref 38–234)

## 2019-12-27 MED ORDER — FENTANYL CITRATE (PF) 100 MCG/2ML IJ SOLN
25.0000 ug | Freq: Once | INTRAMUSCULAR | Status: AC
  Administered 2019-12-27: 25 ug via INTRAVENOUS
  Filled 2019-12-27: qty 2

## 2019-12-27 MED ORDER — MORPHINE SULFATE 2 MG/ML IJ SOLN
4.0000 mg | Freq: Once | INTRAMUSCULAR | Status: AC
  Administered 2019-12-27: 1.75 mg via INTRAVENOUS
  Filled 2019-12-27: qty 2

## 2019-12-27 MED ORDER — SODIUM CHLORIDE 0.9 % IV SOLN
Freq: Once | INTRAVENOUS | Status: AC
  Filled 2019-12-27: qty 250

## 2019-12-27 MED ORDER — ONDANSETRON HCL 4 MG/2ML IJ SOLN
4.0000 mg | Freq: Once | INTRAMUSCULAR | Status: AC
  Administered 2019-12-27: 17:00:00 4 mg via INTRAVENOUS
  Filled 2019-12-27: qty 2

## 2019-12-27 MED ORDER — KETOROLAC TROMETHAMINE 15 MG/ML IJ SOLN
15.0000 mg | Freq: Once | INTRAMUSCULAR | Status: AC
  Administered 2019-12-27: 15 mg via INTRAVENOUS
  Filled 2019-12-27: qty 1

## 2019-12-27 MED ORDER — OXYCODONE HCL 5 MG PO TABS
5.0000 mg | ORAL_TABLET | ORAL | Status: AC
  Administered 2019-12-27: 5 mg via ORAL
  Filled 2019-12-27: qty 1

## 2019-12-27 MED ORDER — PROCHLORPERAZINE EDISYLATE 10 MG/2ML IJ SOLN
10.0000 mg | Freq: Once | INTRAMUSCULAR | Status: AC
  Administered 2019-12-27: 10 mg via INTRAVENOUS
  Filled 2019-12-27: qty 2

## 2019-12-27 NOTE — Progress Notes (Signed)
Pt and sister state that 1-2 hours after inj. On wed. Pt starting hurting. She had knots come up on her neck, face and neck swelling, abdomen swollen, upper legs per pt and sister. To touch her she is sensitive and states it hurts. She moans out in pain. She makes sounds like she is about to vomit and I gave her a emesis bag. She refuses to wear the duragesic patch. Pt. Wants to know if the [place] caused this or poss. The inj. She got. She took pain pill 4 am and nausea med at 5:15 am . She does have fever 100.9. she has sores in her mouth per pt.

## 2019-12-27 NOTE — ED Triage Notes (Addendum)
First Nurse Note:  Patient presents to the ED from the [facility].  Patient was being seen for intractable pain and was given 15mg  of toradol and was given 1.75 of morphine IV and patient started to complain of shortness of breath.  Patient has stage 4 metastatic cancer.  Patient is speaking clearly in full sentences.  Patient appears somewhat anxious.

## 2019-12-27 NOTE — ED Provider Notes (Signed)
[facility] Emergency Department Provider Note ____________________________________________   First MD Initiated Contact with Patient 12/27/19 1612     (approximate)  I have reviewed the triage vital signs and the nursing notes.  HISTORY  Chief Complaint Shortness of Breath  HPI Deborah Nguyen is a 59 y.o. female history of metastatic cancer.  Patient seen at the oncology clinic today, at that point they noticed she had a low-grade fever though it notes do indicate patient reports that she has had some fevers off and on for unknown reason along with her cancer.  She reports she had a injection to bring her white blood cell count up yesterday and since then she has noticed increasing pain mostly in her lower back but also throughout her body and bones  She takes oxycodone at home, came today to be evaluated she reports the pain is increased.  Similar symptoms several days ago, had a CT scan for them pain in the same location, seem to been getting better but then she got her injection yesterday and the pain has increased incrementally  No vomiting.  She reports fatigue, mostly reporting need for pain control.  She tried morphine at the clinic, but after receiving a very small dose she started to feel short of breath from it.  This is gone away, but she is continued to have ongoing pain.  Both patient and her sister requesting pain control  Denies Covid exposure, previously tested.  Patient reports she has had no symptoms of infection and did not know she had any fever until a test throughout the clinic today  Past Medical History:  Diagnosis Date  . Anxiety   . Arthritis   . Chronic kidney disease   . Family history of breast cancer    aunt  . GERD (gastroesophageal reflux disease)   . History of DVT of lower extremity    Left leg  . Lung cancer ([place])    with brain mets  . Lung mass   . Malignant hyperthermia   . 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 Muscular dystrophy [facility])     Patient Active Problem List   Diagnosis Date Noted  . Muscular dystrophy ([place]) 10/24/2019  . Thyroid nodule 10/24/2019  . Goals of care, counseling/discussion 09/20/2019  . 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 Malignant neoplasm of lower lobe of right lung ([place]) 09/20/2019  . GAD (generalized anxiety disorder) 09/01/2018  . History of kidney stones 09/01/2018  . Subclinical hyperthyroidism 01/24/2017  . Central core myopathy 09/17/2016  . Low serum vitamin D 09/17/2016  . Hx of adenomatous colonic polyps 08/01/2015  . History of DVT of lower extremity 05/23/2015  . Anxiety 05/31/2014  . GERD (gastroesophageal reflux disease) 05/31/2014  . Fever of unknown origin 05/06/2013  . 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 CESAREAN SECTION  11/1985  . COLONOSCOPY N/A 10/27/2015   Procedure: COLONOSCOPY;  Surgeon: Manya Silvas, MD;  Location: [facility] ENDOSCOPY;  Service: Endoscopy;  Laterality: N/A;   NO Propofol - per office  . LITHOTRIPSY    . PORTA CATH INSERTION N/A 11/09/2019   Procedure: PORTA CATH INSERTION;  Surgeon: Algernon Huxley, MD;  Location: [facility] CV LAB;  Service: Cardiovascular;  Laterality: N/A;  . TUBAL LIGATION  03/1986    Prior to Admission medications   Medication  Sig Start Date End Date Taking? Authorizing Provider  acetaminophen (TYLENOL) 325 MG tablet Take 650 mg by mouth every 6 (six) hours as needed.    [provider]  ALPRAZolam Duanne Moron) 0.25 MG tablet Take 0.25 mg by mouth at bedtime as needed for anxiety.    [provider]  apixaban (ELIQUIS) 5 MG TABS tablet Take 1 tablet (5 mg total) by mouth 2 (two) times daily. 12/05/19   Sindy Guadeloupe, MD  citalopram (CELEXA) 40 MG tablet Take 1 tablet (40 mg total) by mouth daily. 12/11/19   Borders, Kirt Boys, NP  dexamethasone  (DECADRON) 4 MG tablet Take 2 tablets (8 mg total) by mouth daily. Start the day after carboplatin chemotherapy for 3 days. 11/06/19   Sindy Guadeloupe, MD  fluticasone (FLONASE) 50 MCG/ACT nasal spray Place 2 sprays into both nostrils daily as needed.     [provider]  lidocaine-prilocaine (EMLA) cream Apply to affected area once 11/06/19   Sindy Guadeloupe, MD  magic mouthwash SOLN Take 5 mLs by mouth 3 (three) times daily as needed for mouth pain. 10/19/19   Sindy Guadeloupe, MD  methimazole (TAPAZOLE) 5 MG tablet Take 5 mg by mouth daily.    [provider]  metoprolol succinate (TOPROL-XL) 25 MG 24 hr tablet Take 12.5 tablets by mouth daily. 03/07/12   [provider]  nystatin (MYCOSTATIN) 100000 UNIT/ML suspension Take 5 mLs (500,000 Units total) by mouth 4 (four) times daily. Swish and spit. 10/08/19   Sindy Guadeloupe, MD  ondansetron (ZOFRAN) 8 MG tablet Take 1 tablet (8 mg total) by mouth 2 (two) times daily as needed for refractory nausea / vomiting. Start on day 3 after carboplatin chemo. 12/17/19   Sindy Guadeloupe, MD  Oxycodone HCl 10 MG TABS Take 1 tablet (10 mg total) by mouth every 4 (four) hours as needed. 12/11/19   Borders, Kirt Boys, NP  pantoprazole (PROTONIX) 20 MG tablet TAKE 1 TABLET BY MOUTH 2 TIMES DAILY 12/18/19   Sindy Guadeloupe, MD  prochlorperazine (COMPAZINE) 10 MG tablet Take 1 tablet (10 mg total) by mouth every 6 (six) hours as needed (Nausea or vomiting). 11/06/19   Sindy Guadeloupe, MD  sucralfate (CARAFATE) 1 g tablet Take 1 tablet (1 g total) by mouth 3 (three) times daily. Dissolve in 3-4 tbsp warm water, swish and swallow. 10/30/19   Noreene Filbert, MD  vitamin B-12 (CYANOCOBALAMIN) 1000 MCG tablet Take 1 tablet (1,000 mcg total) by mouth daily. 10/19/19   Sindy Guadeloupe, MD    Allergies Paxil [paroxetine hcl], Ciprofloxacin, Levaquin [levofloxacin], Propofol, Serzone [nefazodone], Biaxin [clarithromycin], Erythromycin, and Penicillins  Family  History  Problem Relation Age of Onset  . Cancer Father        bladder, lung  . Breast cancer Maternal Aunt 58    Social History Social History   Tobacco Use  . Smoking status: Former Smoker    Packs/day: 1.00    Types: Cigarettes    Quit date: 05/2016    Years since quitting: 3.6  . Smokeless tobacco: Never Used  Substance Use Topics  . Alcohol use: No  . Drug use: No    Review of Systems Constitutional: No fever/chills except 1 reported fever at clinic this morning Eyes: No visual changes. ENT: No sore throat. Cardiovascular: Denies chest pain. Respiratory: Denies shortness of breath presently but felt short of breath briefly after receiving morphine injection. Gastrointestinal: No abdominal pain.  Nausea, but this has  been unremitting and is treated with Zofran and through the cancer center Genitourinary: Negative for dysuria. Musculoskeletal: Pain all over, but mostly centered in her lower back, was present several days ago had a CT scan for.  Seems to be flaring up again.  No falls or injuries.. Skin: Negative for rash. Neurological: Negative for headaches, areas of focal weakness or numbness.    ____________________________________________   PHYSICAL EXAM:  VITAL SIGNS: ED Triage Vitals  Enc Vitals Group     BP 12/27/19 1325 125/67     Pulse Rate 12/27/19 1325 85     Resp 12/27/19 1325 16     Temp 12/27/19 1325 98.7 F (37.1 C)     Temp Source 12/27/19 1325 Oral     SpO2 12/27/19 1325 96 %     Weight --      Height --      Head Circumference --      Peak Flow --      Pain Score 12/27/19 1632 10     Pain Loc --      Pain Edu? --      Excl. in [place]? --     Constitutional: Alert and oriented.  Appears anxious, requesting pain medication.  Reports that she has not had access to her normal pain medicine since she has been here to the hospital all day either Eyes: Conjunctivae are normal. Head: Atraumatic. Nose: No congestion/rhinnorhea. Mouth/Throat:  Mucous membranes are moist. Neck: No stridor.  Cardiovascular: Normal rate, regular rhythm. Grossly normal heart sounds.  Good peripheral circulation. Respiratory: Normal respiratory effort.  No retractions. Lungs CTAB. Gastrointestinal: Soft and nontender. No distention. Musculoskeletal: No lower extremity tenderness nor edema.  Able to range her extremities.  No focal deficits.  No midline cervical thoracic or lumbar tenderness or lesions noted over the lower back.  Able to roll herself on her side without difficulty.  Does have difficulty sitting up though due to what she reports is significant pain in her back and muscles when she tries to sit up neurologic:  Normal speech and language. No gross focal neurologic deficits are appreciated.  Skin:  Skin is warm, dry and intact. No rash noted. Psychiatric: Mood and affect are normal. Speech and behavior are normal.  ____________________________________________   LABS (all labs ordered are listed, but only abnormal results are displayed)  Labs Reviewed  CK - Abnormal; Notable for the following components:      Result Value   Total CK 8 (*)    All other components within normal limits  CBC WITH DIFFERENTIAL/PLATELET - Abnormal; Notable for the following components:   WBC 44.1 (*)    RBC 3.61 (*)    Hemoglobin 10.9 (*)    HCT 34.9 (*)    RDW 18.6 (*)    Neutro Abs 39.7 (*)    Lymphs Abs 0.6 (*)    Abs Immature Granulocytes 3.49 (*)    All other components within normal limits  COMPREHENSIVE METABOLIC PANEL - Abnormal; Notable for the following components:   Creatinine, Ser 0.37 (*)    Calcium 8.7 (*)    Albumin 3.4 (*)    All other components within normal limits  NOVEL CORONAVIRUS, NAA (HOSP ORDER, SEND-OUT TO REF LAB; TAT 18-24 HRS)  LIPASE, BLOOD  URINALYSIS, COMPLETE (UACMP) WITH MICROSCOPIC   ____________________________________________  EKG   ____________________________________________  RADIOLOGY  DG Chest Portable  1 View  Result Date: 12/27/2019 CLINICAL DATA:  59 year old female with fever. Right lower lobe mass  status post radiation. EXAM: PORTABLE CHEST 1 VIEW COMPARISON:  Chest radiograph dated 10/03/2019. CT of the chest abdomen pelvis dated 12/20/2019. FINDINGS: Right-sided Port-A-Cath with tip at the cavoatrial junction. Right lung base linear and platelike atelectasis/scarring or fibrosis related to post radiation changes of the known right lower lobe mass. The mass seen on the prior radiograph of 10/03/2019 is not as conspicuous on the current study. No new consolidative changes. There is no pleural effusion or pneumothorax. The cardiac silhouette is within normal limits. No acute osseous pathology. IMPRESSION: 1. No acute cardiopulmonary process. 2. Right lung base atelectasis/scarring or post radiation fibrosis. The previously seen right lung base mass is not as conspicuous on today's exam. Electronically Signed   By: Anner Crete M.D.   On: 12/27/2019 17:38    X-ray reviewed negative for acute  Reviewed CT abdomen pelvis from January 14, notes indicate patient having similar symptoms at that time and there was no noted acute finding-  ____________________________________________   PROCEDURES  Procedure(s) performed: None  Procedures  Critical Care performed: No  ____________________________________________   INITIAL IMPRESSION / ASSESSMENT AND PLAN / ED COURSE  Pertinent labs & imaging results that were available during my care of the patient were reviewed by me and considered in my medical decision making (see chart for details).   Patient presents for evaluation for pain, she describes somewhat whole body pain or possible bone pain is she describes with, with particular pain and discomfort in her lower back.  Reports same in the past, and notes indicate that this pain was present evaluated with CT scan on January 14 as well.  Seems of worsened quite a bit since receiving injection  yesterday.  Also had a low-grade fever at the chemo clinic, but afebrile here and denies infectious symptoms.  However will work-up with lab work and urinalysis as suggested by Dr. Janese Banks  We will work pain control, and plan to reevaluate.     Procedure medications, patient reports she feels much better.  She appears a different person, resting quite comfortably without distress.  She is now feeling much better, requesting be discharged.  Patient was not able to give urinalysis, does not wish to stay to provide that to rule out urinary infection though she lacks symptomatology related to UTI.  Discussed case with Dr. Janese Banks, they will follow up with her for careful reevaluation and understand her Covid test is also pending at this time.  Deborah Nguyen was evaluated in Emergency Department on 12/27/2019 for the symptoms described in the history of present illness. She was evaluated in the context of the global COVID-19 pandemic, which necessitated consideration that the patient might be at risk for infection with the SARS-CoV-2 virus that causes COVID-19. Institutional protocols and algorithms that pertain to the evaluation of patients at risk for COVID-19 are in a state of rapid change based on information released by regulatory bodies including the CDC and federal and state organizations. These policies and algorithms were followed during the patient's care in the ED.  Patient comfortable with plan for discharge, pain well controlled.  Her son picking her up.  No new prescriptions for today  Return precautions and treatment recommendations and follow-up discussed with the patient who is agreeable with the plan.   ____________________________________________   FINAL CLINICAL IMPRESSION(S) / ED DIAGNOSES  Final diagnoses:  Joint pain following chemotherapy        Note:  This document was prepared using Dragon voice recognition software and may include  unintentional dictation errors         Delman Kitten, MD 12/27/19 2002

## 2019-12-27 NOTE — Progress Notes (Signed)
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 She presented to Dr. Wilburn Cornelia symptoms of right flank pain which was radiating to her front which prompted a CT abdomen. CT abdomen pelvis with contrast showed a 3.7 x 2.8 x 5 cm right adrenal mass. 1.7 x 1.2 x 2.1 cm left adrenal nodule 1.8 cm right renal simple cyst. And a new 3.6 x 3.2 cm spiculated mass in the right lower lobe with tethering of the adjacent pleura. Prominent hilar lymph nodes. This was followed by a PET CT scan on 09/20/2019 which showed a 3.6 cm right lower lobe mass which was markedly hypermetabolic along with hypermetabolic metastatic disease in the right supraclavicular space as well as mediastinum and right hilum and hypermetabolic metastatic disease in bilateral adrenal glands. She will also noted to have bilateral hypermetabolic thyroid nodules and a possible nodule in the pelvis adjacent to the sigmoid colon.  Patient currently reports ongoing pain in her right flank which is relatively well controlled with hydrocodone. She also has occasional nausea for which she has as needed nausea medications. She is here with her twin sister today and is highly anxious Of note patient has a history of central core myopathy and follows up with neuromuscular clinic at [facility]. Patient states that her twin sister has had episodes of hyperthermia but patient herself has not had these  episodes. I did review [facility] neuromuscular clinic note and the hyperthermia has been attributed to her history of hyperthyroidism and thyroid dysfunction rather than malignant hyperthermia. However given her history of myopathy patient does carry risk of malignant hyperthermia.  Not enough tissue was available for NGS testing. Patient therefore underwentLiquid biopsy which showed evidence of PI K3 CA amplification, RIC POR amplification, T p53  Patient developed significant scalp and body rash after cycle 1 of carbotaxol Keytruda.  This resolved after a trial of steroids and Keytruda was held for cycle 2.  Interim scans after 2 cycles showed reduction in the size of the right lower lobe mass as well as some reduction in the adrenal metastases as well.  No new progressive disease  Interval history-patient received cycle 3 of carbotaxol Keytruda on 12/24/2019.  She received Udenyca on 12/26/2019.  Patient states that ever since she received the Udenyca she has been experiencing whole body pain as well as uncontrolled nausea.  She is still not started taking Zyprexa and has not started her fentanyl patch.  She also had a fever of 102 at home.  ECOG PS- 2 Pain scale- 8  Review of systems- Review of Systems  Constitutional: Positive for malaise/fatigue. Negative for chills, fever and weight loss.       Whole body pain  HENT: Negative for congestion, ear discharge and nosebleeds.   Eyes: Negative for blurred vision.  Respiratory: Negative for cough, hemoptysis, sputum production, shortness of breath and wheezing.   Cardiovascular: Positive for leg swelling. Negative for chest pain, palpitations, orthopnea and claudication.  Gastrointestinal: Positive for nausea and vomiting.  Negative for abdominal pain, blood in stool, constipation, diarrhea, heartburn and melena.       Abdominal bloating  Genitourinary: Negative for dysuria, flank pain, frequency, hematuria and urgency.  Musculoskeletal: Negative  for back pain, joint pain and myalgias.  Skin: Negative for rash.  Neurological: Negative for dizziness, tingling, focal weakness, seizures, weakness and headaches.  Endo/Heme/Allergies: Does not bruise/bleed easily.  Psychiatric/Behavioral: Negative for depression and suicidal ideas. The patient does not have insomnia.        Allergies  Allergen Reactions  . Paxil [Paroxetine Hcl] Other (See Comments)    Hallucinations   . Ciprofloxacin Other (See Comments)    Unknown  . Levaquin [Levofloxacin] Other (See Comments)    Unknown   . 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 She has an increase risk of malignant hyperthermia with anesthesia. She should not receive Propofol.   Donne Hazel [Nefazodone] Other (See Comments)    Unknown   . Biaxin [Clarithromycin] Nausea Only  . Erythromycin Nausea Only  . Penicillins Rash     Past Medical History:  Diagnosis Date  . Anxiety   . Arthritis   . Chronic kidney disease   . Family history of breast cancer    aunt  . GERD (gastroesophageal reflux disease)   . History of DVT of lower extremity    Left leg  . Lung cancer ([place])    with brain mets  . Lung mass   . Malignant hyperthermia   . 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 Muscular dystrophy [facility])      Past Surgical History:  Procedure Laterality Date  . CESAREAN SECTION  11/1985  . COLONOSCOPY N/A 10/27/2015   Procedure: COLONOSCOPY;  Surgeon: Manya Silvas, MD;  Location: [facility] ENDOSCOPY;  Service: Endoscopy;  Laterality: N/A;   NO Propofol - per office  . LITHOTRIPSY    . PORTA CATH INSERTION N/A 11/09/2019   Procedure: PORTA CATH INSERTION;  Surgeon: Algernon Huxley, MD;  Location: [place] CV LAB;  Service: Cardiovascular;  Laterality: N/A;  . TUBAL LIGATION  03/1986    Social History   Socioeconomic History  . Marital status: Divorced    Spouse name: Not on  file  . Number of children: Not on file  . Years of education: Not on file  . Highest education level: Not on file  Occupational History  . Not on file  Tobacco Use  . Smoking status: Former Smoker    Packs/day: 1.00    Types: Cigarettes    Quit date: 05/2016    Years since quitting: 3.6  . Smokeless tobacco: Never Used  Substance and Sexual Activity  . Alcohol use: No  . Drug use: No  . Sexual activity: Not on file  Other Topics Concern  . Not on file  Social History Narrative  . Not on file   Social Determinants of Health   Financial Resource Strain:   . Difficulty of Paying Living Expenses: Not on file  Food Insecurity:   . Worried About Charity fundraiser in the Last Year: Not on file  . Ran Out of Food in the Last Year: Not on file  Transportation Needs:   . Lack of Transportation (Medical): Not on file  . Lack of Transportation (Non-Medical): Not on file  Physical Activity:   . Days of Exercise per Week: Not  on file  . Minutes of Exercise per Session: Not on file  Stress:   . Feeling of Stress : Not on file  Social Connections:   . Frequency of Communication with Friends and Family: Not on file  . Frequency of Social Gatherings with Friends and Family: Not on file  . Attends Religious Services: Not on file  . Active Member of Clubs or Organizations: Not on file  . Attends Archivist Meetings: Not on file  . Marital Status: Not on file  Intimate Partner Violence:   . Fear of Current or Ex-Partner: Not on file  . Emotionally Abused: Not on file  . Physically Abused: Not on file  . Sexually Abused: Not on file    Family History  Problem Relation Age of Onset  . Cancer Father        bladder, lung  . Breast cancer Maternal Aunt 45     Current Outpatient Medications:  .  acetaminophen (TYLENOL) 325 MG tablet, Take 650 mg by mouth every 6 (six) hours as needed., Disp: , Rfl:  .  ALPRAZolam (XANAX) 0.25 MG tablet, Take 0.25 mg by mouth at bedtime  as needed for anxiety., Disp: , Rfl:  .  apixaban (ELIQUIS) 5 MG TABS tablet, Take 1 tablet (5 mg total) by mouth 2 (two) times daily., Disp: 60 tablet, Rfl: 2 .  citalopram (CELEXA) 40 MG tablet, Take 1 tablet (40 mg total) by mouth daily., Disp: 30 tablet, Rfl: 3 .  dexamethasone (DECADRON) 4 MG tablet, Take 2 tablets (8 mg total) by mouth daily. Start the day after carboplatin chemotherapy for 3 days., Disp: 30 tablet, Rfl: 1 .  fluticasone (FLONASE) 50 MCG/ACT nasal spray, Place 2 sprays into both nostrils daily as needed. , Disp: , Rfl:  .  magic mouthwash SOLN, Take 5 mLs by mouth 3 (three) times daily as needed for mouth pain., Disp: 300 mL, Rfl: 1 .  methimazole (TAPAZOLE) 5 MG tablet, Take 5 mg by mouth daily., Disp: , Rfl:  .  metoprolol succinate (TOPROL-XL) 25 MG 24 hr tablet, Take 12.5 tablets by mouth daily., Disp: , Rfl:  .  nystatin (MYCOSTATIN) 100000 UNIT/ML suspension, Take 5 mLs (500,000 Units total) by mouth 4 (four) times daily. Swish and spit., Disp: 300 mL, Rfl: 0 .  ondansetron (ZOFRAN) 8 MG tablet, Take 1 tablet (8 mg total) by mouth 2 (two) times daily as needed for refractory nausea / vomiting. Start on day 3 after carboplatin chemo., Disp: 30 tablet, Rfl: 1 .  Oxycodone HCl 10 MG TABS, Take 1 tablet (10 mg total) by mouth every 4 (four) hours as needed., Disp: 60 tablet, Rfl: 0 .  pantoprazole (PROTONIX) 20 MG tablet, TAKE 1 TABLET BY MOUTH 2 TIMES DAILY, Disp: 120 tablet, Rfl: 0 .  prochlorperazine (COMPAZINE) 10 MG tablet, Take 1 tablet (10 mg total) by mouth every 6 (six) hours as needed (Nausea or vomiting)., Disp: 30 tablet, Rfl: 1 .  sucralfate (CARAFATE) 1 g tablet, Take 1 tablet (1 g total) by mouth 3 (three) times daily. Dissolve in 3-4 tbsp warm water, swish and swallow., Disp: 90 tablet, Rfl: 3 .  vitamin B-12 (CYANOCOBALAMIN) 1000 MCG tablet, Take 1 tablet (1,000 mcg total) by mouth daily., Disp: 30 tablet, Rfl: 1 .  fentaNYL (DURAGESIC) 12 MCG/HR, Place 1  patch onto the skin every 3 (three) days. (Patient not taking: Reported on 12/27/2019), Disp: 10 patch, Rfl: 0 .  lidocaine-prilocaine (EMLA) cream, Apply to affected area  once, Disp: 30 g, Rfl: 3 .  OLANZapine (ZYPREXA) 10 MG tablet, Take 1 tablet (10 mg total) by mouth at bedtime. (Patient not taking: Reported on 12/27/2019), Disp: 30 tablet, Rfl: 1 .  predniSONE (DELTASONE) 10 MG tablet, Take 1 tablet (10 mg total) by mouth as directed. If develops rash-5 tablets day x 2 days, 4 tablets daily x 2 days, 3 tablets daily x 2 days, 2 tablets daily x 2 days , 1 tablet daily x 2 days, stop.  Take with food. (Patient not taking: Reported on 12/27/2019), Disp: 30 tablet, Rfl: 0 No current facility-administered medications for this visit.  Facility-Administered Medications Ordered in Other Visits:  .  morphine 2 MG/ML injection 4 mg, 4 mg, Intravenous, Once, Sindy Guadeloupe, MD  Physical exam:  Vitals:   12/27/19 1136  BP: 116/76  Pulse: 93  Resp: 16  Temp: (!) 100.9 F (38.3 C)  TempSrc: Tympanic   Physical Exam Constitutional:      Comments: Appears fatigued  HENT:     Head: Normocephalic and atraumatic.  Eyes:     Pupils: Pupils are equal, round, and reactive to light.  Cardiovascular:     Rate and Rhythm: Regular rhythm. Tachycardia present.     Heart sounds: Normal heart sounds.  Pulmonary:     Effort: Pulmonary effort is normal.     Breath sounds: Normal breath sounds.  Abdominal:     General: Bowel sounds are normal. There is no distension.     Palpations: Abdomen is soft.  Musculoskeletal:     Cervical back: Normal range of motion.     Right lower leg: No edema.     Left lower leg: No edema.  Skin:    General: Skin is warm and dry.  Neurological:     Mental Status: She is alert and oriented to person, place, and time.      CMP Latest Ref Rng & Units 12/24/2019  Glucose 70 - 99 mg/dL 107(H)  BUN 6 - 20 mg/dL 11  Creatinine 0.44 - 1.00 mg/dL 0.38(L)  Sodium 135 - 145  mmol/L 139  Potassium 3.5 - 5.1 mmol/L 3.8  Chloride 98 - 111 mmol/L 102  CO2 22 - 32 mmol/L 28  Calcium 8.9 - 10.3 mg/dL 9.0  Total Protein 6.5 - 8.1 g/dL 6.9  Total Bilirubin 0.3 - 1.2 mg/dL 0.5  Alkaline Phos 38 - 126 U/L 96  AST 15 - 41 U/L 21  ALT 0 - 44 U/L 23   CBC Latest Ref Rng & Units 12/24/2019  WBC 4.0 - 10.5 K/uL 6.7  Hemoglobin 12.0 - 15.0 g/dL 10.2(L)  Hematocrit 36.0 - 46.0 % 33.1(L)  Platelets 150 - 400 K/uL 339    No images are attached to the encounter.  CT Chest W Contrast  Result Date: 12/20/2019 CLINICAL DATA:  Lung cancer.  Restaging. EXAM: CT CHEST, ABDOMEN, AND PELVIS WITH CONTRAST TECHNIQUE: Multidetector CT imaging of the chest, abdomen and pelvis was performed following the standard protocol during bolus administration of intravenous contrast. CONTRAST:  121m OMNIPAQUE IOHEXOL 300 MG/ML  SOLN COMPARISON:  Abdomen/pelvis CT 10/03/2019. PET-CT 09/20/2019. Chest CT 06/16/2009. FINDINGS: CT CHEST FINDINGS Cardiovascular: The heart size is normal. No substantial pericardial effusion. Atherosclerotic calcification is noted in the wall of the thoracic aorta. Right Port-A-Cath tip is positioned in the upper right atrium. Mediastinum/Nodes: Stable 14 mm left thyroid nodule since 06/16/2009, compatible with benign etiology. No mediastinal lymphadenopathy. 10 mm short axis subcarinal node on 27/2 is  upper normal for size. There is no hilar lymphadenopathy. The esophagus has normal imaging features. There is no axillary lymphadenopathy. Lungs/Pleura: Centrilobular emphsyema noted.Right lower lobe pulmonary mass is smaller today measuring 1.8 x 2.1 cm compared to chest CT 06/16/2009. No new pulmonary nodule or mass. No effusion. Musculoskeletal: No worrisome lytic or sclerotic osseous abnormality. CT ABDOMEN PELVIS FINDINGS Hepatobiliary: No suspicious focal abnormality within the liver parenchyma. A tiny hypodensity in the right liver is stable in the interval. There is no  evidence for gallstones, gallbladder wall thickening, or pericholecystic fluid. No intrahepatic or extrahepatic biliary dilation. Pancreas: No focal mass lesion. No dilatation of the main duct. No intraparenchymal cyst. No peripancreatic edema. Spleen: No splenomegaly. No focal mass lesion. Adrenals/Urinary Tract: Right lower lobe pulmonary mass is smaller today measuring 1.8 x 2.1 cm compared to 3.6 x 3.1 cm on the previous PET-CT. The left adrenal lesion is 3.3 x 2.4 cm today which compares to 3.3 x 2.4 cm when I remeasure in a similar fashion on the previous study. Stable right renal cyst. Kidneys otherwise unremarkable. No evidence for hydroureter. The urinary bladder appears normal for the degree of distention. Stomach/Bowel: Stomach is unremarkable. No gastric wall thickening. No evidence of outlet obstruction. Duodenum is normally positioned as is the ligament of Treitz. No small bowel wall thickening. No small bowel dilatation. The terminal ileum is normal. The appendix is normal. No gross colonic mass. No colonic wall thickening. Vascular/Lymphatic: There is abdominal aortic atherosclerosis without aneurysm. There is no gastrohepatic or hepatoduodenal ligament lymphadenopathy. No retroperitoneal or mesenteric lymphadenopathy. No pelvic sidewall lymphadenopathy. Reproductive: The uterus is unremarkable.  There is no adnexal mass. Other: No intraperitoneal free fluid. Musculoskeletal: No worrisome lytic or sclerotic osseous abnormality. Superior endplate compression deformity at T12 is stable. IMPRESSION: 1. Interval decrease in size of the right lower lobe pulmonary mass with of all vein adjacent radiation fibrosis. 2. Interval decrease in size of the right adrenal metastatic lesion with similar appearance of the left adrenal metastasis. 3. No new or progressive findings in the chest, abdomen, or pelvis on today's study to suggest new sites of metastatic involvement. 4.  Aortic Atherosclerois (ICD10-170.0)  Electronically Signed   By: Misty Stanley M.D.   On: 12/20/2019 14:45   CT Abdomen Pelvis W Contrast  Result Date: 12/20/2019 CLINICAL DATA:  Lung cancer.  Restaging. EXAM: CT CHEST, ABDOMEN, AND PELVIS WITH CONTRAST TECHNIQUE: Multidetector CT imaging of the chest, abdomen and pelvis was performed following the standard protocol during bolus administration of intravenous contrast. CONTRAST:  135m OMNIPAQUE IOHEXOL 300 MG/ML  SOLN COMPARISON:  Abdomen/pelvis CT 10/03/2019. PET-CT 09/20/2019. Chest CT 06/16/2009. FINDINGS: CT CHEST FINDINGS Cardiovascular: The heart size is normal. No substantial pericardial effusion. Atherosclerotic calcification is noted in the wall of the thoracic aorta. Right Port-A-Cath tip is positioned in the upper right atrium. Mediastinum/Nodes: Stable 14 mm left thyroid nodule since 06/16/2009, compatible with benign etiology. No mediastinal lymphadenopathy. 10 mm short axis subcarinal node on 27/2 is upper normal for size. There is no hilar lymphadenopathy. The esophagus has normal imaging features. There is no axillary lymphadenopathy. Lungs/Pleura: Centrilobular emphsyema noted.Right lower lobe pulmonary mass is smaller today measuring 1.8 x 2.1 cm compared to chest CT 06/16/2009. No new pulmonary nodule or mass. No effusion. Musculoskeletal: No worrisome lytic or sclerotic osseous abnormality. CT ABDOMEN PELVIS FINDINGS Hepatobiliary: No suspicious focal abnormality within the liver parenchyma. A tiny hypodensity in the right liver is stable in the interval. There is no evidence for  gallstones, gallbladder wall thickening, or pericholecystic fluid. No intrahepatic or extrahepatic biliary dilation. Pancreas: No focal mass lesion. No dilatation of the main duct. No intraparenchymal cyst. No peripancreatic edema. Spleen: No splenomegaly. No focal mass lesion. Adrenals/Urinary Tract: Right lower lobe pulmonary mass is smaller today measuring 1.8 x 2.1 cm compared to 3.6 x 3.1 cm on the  previous PET-CT. The left adrenal lesion is 3.3 x 2.4 cm today which compares to 3.3 x 2.4 cm when I remeasure in a similar fashion on the previous study. Stable right renal cyst. Kidneys otherwise unremarkable. No evidence for hydroureter. The urinary bladder appears normal for the degree of distention. Stomach/Bowel: Stomach is unremarkable. No gastric wall thickening. No evidence of outlet obstruction. Duodenum is normally positioned as is the ligament of Treitz. No small bowel wall thickening. No small bowel dilatation. The terminal ileum is normal. The appendix is normal. No gross colonic mass. No colonic wall thickening. Vascular/Lymphatic: There is abdominal aortic atherosclerosis without aneurysm. There is no gastrohepatic or hepatoduodenal ligament lymphadenopathy. No retroperitoneal or mesenteric lymphadenopathy. No pelvic sidewall lymphadenopathy. Reproductive: The uterus is unremarkable.  There is no adnexal mass. Other: No intraperitoneal free fluid. Musculoskeletal: No worrisome lytic or sclerotic osseous abnormality. Superior endplate compression deformity at T12 is stable. IMPRESSION: 1. Interval decrease in size of the right lower lobe pulmonary mass with of all vein adjacent radiation fibrosis. 2. Interval decrease in size of the right adrenal metastatic lesion with similar appearance of the left adrenal metastasis. 3. No new or progressive findings in the chest, abdomen, or pelvis on today's study to suggest new sites of metastatic involvement. 4.  Aortic Atherosclerois (ICD10-170.0) Electronically Signed   By: Misty Stanley M.D.   On: 12/20/2019 14:45     Assessment and plan- Patient is a 59 y.o. female withsquamous cell carcinoma of the lung stage IV [id number] with adrenal and brain metastases.She is s/p 3 cycles of carbotaxol Keytruda chemotherapy and this is an acute visit for fever, whole body pain and nausea  1.  Fever: Even before starting chemotherapy patient has had on and off  fevers for no clear etiology.  In the past we have worked up for fever including blood cultures chest x-ray and urine analysis which has been unremarkable.  She did have a temperature of 100.9 in our clinic today.  I offered her going to the ER versus trying outpatient management and initially patient wanted to try outpatient management.  We were planning to get urine analysis and give her empiric antibiotics over the weekend to see how she does and if she still had ongoing fever plan was to send her to the ER.  However patient has been asked to go to the ER after the clinic visit today (see below)  2.  Chemo-induced nausea/vomiting: We gave her 10 mg of IV Compazine here in the clinic today and have encouraged her to start Zyprexa 10 mg at night and continue her as needed nausea medications.  Despite telling her multiple times patient has not yet started taking Zyprexa for chemo-induced nausea.  We did start IV fluids here in the clinic as well   3.  Whole body pain: I do suspect an element of anxiety here.  She did have her recent scans on 12/20/2019 which showed continued response to treatment and there was no obvious etiology for her abdominal pain.  We did start her fentanyl patch here in the clinic today which the patient has been hesitating to use at home.  She has been using as needed oxycodone at home.  I wanted to give her IV pain medicine here in the clinic and the plan was to give her 4 mg of IV morphine.  Patient barely got 1.7 mg of IV morphine when she started experiencing subjective difficulty breathing and felt like her legs were drawing.  Her vitals remained stable throughout her clinic visit today and her oxygen saturations were 100%.  Her breath sounds were equal bilaterally and she remained hemodynamically stable with a heart rate in the 80s and blood pressure in the 100s.  Given her uncontrolled pain and nausea I am sending her to the ER.  Patient feels that she has reacted badly to  morphine although I do not see any clinical evidence of hemodynamic instability following morphine.  Patient feels that her whole body is swollen and bloated although I do not appreciate any anasarca or leg edema   Visit Diagnosis 1. 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 Malignant neoplasm of lower lobe of right lung ([facility])   3. Dehydration   4. Chemotherapy induced nausea and vomiting   5. Neoplasm related pain      Dr. Randa Evens, MD, MPH [facility] [phone] 12/27/2019 1:28 PM

## 2019-12-28 ENCOUNTER — Telehealth: Payer: Self-pay | Admitting: Oncology

## 2019-12-28 ENCOUNTER — Telehealth: Payer: Self-pay | Admitting: Hospice and Palliative Medicine

## 2019-12-28 LAB — NOVEL CORONAVIRUS, NAA (HOSP ORDER, SEND-OUT TO REF LAB; TAT 18-24 HRS): SARS-CoV-2, NAA: NOT DETECTED

## 2019-12-28 NOTE — Telephone Encounter (Signed)
I spoke with home health social worker.  She had faxed a form for PCS services to patient's PCP but is not yet heard back.  I requested that she fax [place] out forms that we might help expedite the process.  She also plans to speak with patient about getting on the waiting list for the CAP program.

## 2019-12-28 NOTE — Telephone Encounter (Signed)
Due to being in the ER on 12-27-19, MD requested that patient's appointments be changed. Appointments were changed per MD request. Writer phoned sister and informed of appointment changes.

## 2019-12-31 ENCOUNTER — Encounter: Payer: Self-pay | Admitting: Oncology

## 2019-12-31 ENCOUNTER — Other Ambulatory Visit: Payer: Self-pay

## 2019-12-31 ENCOUNTER — Inpatient Hospital Stay (HOSPITAL_BASED_OUTPATIENT_CLINIC_OR_DEPARTMENT_OTHER): Payer: Medicare Other | Admitting: Oncology

## 2019-12-31 DIAGNOSIS — C3431 Malignant neoplasm of lower lobe, right bronchus or lung: Secondary | ICD-10-CM | POA: Diagnosis not present

## 2019-12-31 DIAGNOSIS — G893 Neoplasm related pain (acute) (chronic): Secondary | ICD-10-CM

## 2019-12-31 DIAGNOSIS — R110 Nausea: Secondary | ICD-10-CM

## 2019-12-31 DIAGNOSIS — C7971 Secondary malignant neoplasm of right adrenal gland: Secondary | ICD-10-CM | POA: Diagnosis not present

## 2019-12-31 DIAGNOSIS — T451X5A Adverse effect of antineoplastic and immunosuppressive drugs, initial encounter: Secondary | ICD-10-CM

## 2019-12-31 DIAGNOSIS — C7972 Secondary malignant neoplasm of left adrenal gland: Secondary | ICD-10-CM

## 2020-01-01 ENCOUNTER — Other Ambulatory Visit: Payer: Self-pay | Admitting: *Deleted

## 2020-01-01 MED ORDER — OXYCODONE HCL 10 MG PO TABS: 10.0000 mg | ORAL_TABLET | ORAL | 0 refills | Status: DC | PRN

## 2020-01-01 NOTE — Telephone Encounter (Signed)
This was already refilled this morning

## 2020-01-01 NOTE — Progress Notes (Signed)
I connected with Deborah Nguyen on 01/01/20 at  1:15 PM EST by video enabled telemedicine visit and verified that I am speaking with the correct person using two identifiers.   I discussed the limitations, risks, security and privacy concerns of performing an evaluation and management service by telemedicine and the availability of in-person appointments. I also discussed with the patient that there may be a patient responsible charge related to this service. The patient expressed understanding and agreed to proceed.  There were problems during the video connection and it had to be switched to a telephone call  Other persons participating in the visit and their role in the encounter:  Patients sister Deborah Nguyen  Patient's location:  home Provider's location:  work  Risk analyst Complaint: [place] hospital discharge follow-up  History of present illness: Patient is a 59 year old female who is a present smoker of about 1 pack/day. She presented to Dr. Wilburn Cornelia symptoms of right flank pain which was radiating to her front which prompted a CT abdomen. CT abdomen pelvis with contrast showed a 3.7 x 2.8 x 5 cm right adrenal mass. 1.7 x 1.2 x 2.1 cm left adrenal nodule 1.8 cm right renal simple cyst. And a new 3.6 x 3.2 cm spiculated mass in the right lower lobe with tethering of the adjacent pleura. Prominent hilar lymph nodes. This was followed by a PET CT scan on 09/20/2019 which showed a 3.6 cm right lower lobe mass which was markedly hypermetabolic along with hypermetabolic metastatic disease in the right supraclavicular space as well as mediastinum and right hilum and hypermetabolic metastatic disease in bilateral adrenal glands. She will also noted to have bilateral hypermetabolic thyroid nodules and a possible nodule in the pelvis adjacent to the sigmoid colon.  Patient currently reports ongoing pain in her right flank which is relatively well controlled with hydrocodone. She also has occasional nausea for  which she has as needed nausea medications. She is here with her twin sister today and is highly anxious Of note patient has a history of central core myopathy and follows up with neuromuscular clinic at [facility]. Patient states that her twin sister has had episodes of hyperthermia but patient herself has not had these episodes. I did review [facility] neuromuscular clinic note and the hyperthermia has been attributed to her history of hyperthyroidism and thyroid dysfunction rather than malignant hyperthermia. However given her history of myopathy patient does carry risk of malignant hyperthermia.  Not enough tissue was available for NGS testing. Patient therefore underwentLiquid biopsy which showed evidence of PI K3 CA amplification, RIC POR amplification, T p53  Patient developed significant scalp and body rash after cycle 1 of carbotaxol Keytruda. This resolved after a trial of steroids and Keytruda was held for cycle 2. Interim scans after 2 cycles showed reduction in the size of the right lower lobe mass as well as some reduction in the adrenal metastases as well. No new progressive disease   Interval history history mainly obtained with the help of her sister.  Sister reports that since her hospital discharge patient is doing better.  She has started using Zyprexa for nausea and has not complained of nausea in the last couple of days.  She is also wearing her fentanyl 25 mcg patch and using as needed oxycodone about 3-4 doses a day and reports that her pain is well controlled   Review of Systems  Constitutional: Positive for malaise/fatigue. Negative for chills, fever and weight loss.  HENT: Negative for congestion, ear discharge and nosebleeds.  Eyes: Negative for blurred vision.  Respiratory: Negative for cough, hemoptysis, sputum production, shortness of breath and wheezing.   Cardiovascular: Negative for chest pain, palpitations, orthopnea and claudication.  Gastrointestinal: Negative for  abdominal pain, blood in stool, constipation, diarrhea, heartburn, melena, nausea and vomiting.  Genitourinary: Negative for dysuria, flank pain, frequency, hematuria and urgency.  Musculoskeletal: Positive for back pain. Negative for joint pain and myalgias.  Skin: Negative for rash.  Neurological: Negative for dizziness, tingling, focal weakness, seizures, weakness and headaches.  Endo/Heme/Allergies: Does not bruise/bleed easily.  Psychiatric/Behavioral: Negative for depression and suicidal ideas. The patient does not have insomnia.     Allergies  Allergen Reactions  . Paxil [Paroxetine Hcl] Other (See Comments)    Hallucinations   . Ciprofloxacin Other (See Comments)    Unknown  . Levaquin [Levofloxacin] Other (See Comments)    Unknown   . 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 She has an increase risk of malignant hyperthermia with anesthesia. She should not receive Propofol.   Donne Hazel [Nefazodone] Other (See Comments)    Unknown   . Biaxin [Clarithromycin] Nausea Only  . Erythromycin Nausea Only  . Penicillins Rash    Past Medical History:  Diagnosis Date  . Anxiety   . Arthritis   . Chronic kidney disease   . Family history of breast cancer    aunt  . GERD (gastroesophageal reflux disease)   . History of DVT of lower extremity    Left leg  . Lung cancer ([place])    with brain mets  . Lung mass   . Malignant hyperthermia   . 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 Muscular dystrophy [facility])     Past Surgical History:  Procedure Laterality Date  . CESAREAN SECTION  11/1985  . COLONOSCOPY N/A 10/27/2015   Procedure: COLONOSCOPY;  Surgeon: Manya Silvas, MD;  Location: [facility] ENDOSCOPY;  Service: Endoscopy;  Laterality: N/A;   NO Propofol - per office  . LITHOTRIPSY    . PORTA CATH INSERTION N/A 11/09/2019   Procedure: PORTA CATH INSERTION;  Surgeon: Algernon Huxley, MD;  Location: [place] CV LAB;  Service: Cardiovascular;  Laterality: N/A;  . TUBAL LIGATION  03/1986    Social History   Socioeconomic History  . Marital status: Divorced    Spouse name: Not on file  . Number of children: Not on file  . Years of education: Not on file  . Highest education level: Not on file  Occupational History  . Not on file  Tobacco Use  . Smoking status: Former Smoker    Packs/day: 1.00    Types: Cigarettes    Quit date: 05/2016    Years since quitting: 3.6  . Smokeless tobacco: Never Used  Substance and Sexual Activity  . Alcohol use: No  . Drug use: No  . Sexual activity: Not on file  Other Topics Concern  . Not on file  Social History Narrative  . Not on file   Social Determinants of Health   Financial Resource Strain:   . Difficulty of Paying Living Expenses: Not on file  Food Insecurity:   . Worried About Charity fundraiser in the Last Year: Not on file  . Ran Out of Food in the Last Year: Not on file  Transportation Needs:   . Lack of Transportation (Medical): Not on  file  . Lack of Transportation (Non-Medical): Not on file  Physical Activity:   . Days of Exercise per Week: Not on file  . Minutes of Exercise per Session: Not on file  Stress:   . Feeling of Stress : Not on file  Social Connections:   . Frequency of Communication with Friends and Family: Not on file  . Frequency of Social Gatherings with Friends and Family: Not on file  . Attends Religious Services: Not on file  . Active Member of Clubs or Organizations: Not on file  . Attends Archivist Meetings: Not on file  . Marital Status: Not on file  Intimate Partner Violence:   . Fear of Current or Ex-Partner: Not on file  . Emotionally Abused: Not on file  . Physically Abused: Not on file  . Sexually Abused: Not on file    Family History  Problem Relation Age of Onset  . Cancer Father        bladder, lung  . Breast cancer Maternal Aunt 45      Current Outpatient Medications:  .  acetaminophen (TYLENOL) 325 MG tablet, Take 650 mg by mouth every 6 (six) hours as needed., Disp: , Rfl:  .  ALPRAZolam (XANAX) 0.25 MG tablet, Take 0.25 mg by mouth at bedtime as needed for anxiety., Disp: , Rfl:  .  apixaban (ELIQUIS) 5 MG TABS tablet, Take 1 tablet (5 mg total) by mouth 2 (two) times daily., Disp: 60 tablet, Rfl: 2 .  citalopram (CELEXA) 40 MG tablet, Take 1 tablet (40 mg total) by mouth daily., Disp: 30 tablet, Rfl: 3 .  fluticasone (FLONASE) 50 MCG/ACT nasal spray, Place 2 sprays into both nostrils daily as needed. , Disp: , Rfl:  .  lidocaine-prilocaine (EMLA) cream, Apply to affected area once, Disp: 30 g, Rfl: 3 .  magic mouthwash SOLN, Take 5 mLs by mouth 3 (three) times daily as needed for mouth pain., Disp: 300 mL, Rfl: 1 .  methimazole (TAPAZOLE) 5 MG tablet, Take 5 mg by mouth daily., Disp: , Rfl:  .  metoprolol succinate (TOPROL-XL) 25 MG 24 hr tablet, Take 12.5 tablets by mouth daily., Disp: , Rfl:  .  nystatin (MYCOSTATIN) 100000 UNIT/ML suspension, Take 5 mLs (500,000 Units total) by mouth 4 (four) times daily. Swish and spit., Disp: 300 mL, Rfl: 0 .  ondansetron (ZOFRAN) 8 MG tablet, Take 1 tablet (8 mg total) by mouth 2 (two) times daily as needed for refractory nausea / vomiting. Start on day 3 after carboplatin chemo., Disp: 30 tablet, Rfl: 1 .  pantoprazole (PROTONIX) 20 MG tablet, TAKE 1 TABLET BY MOUTH 2 TIMES DAILY, Disp: 120 tablet, Rfl: 0 .  prochlorperazine (COMPAZINE) 10 MG tablet, Take 1 tablet (10 mg total) by mouth every 6 (six) hours as needed (Nausea or vomiting)., Disp: 30 tablet, Rfl: 1 .  sucralfate (CARAFATE) 1 g tablet, Take 1 tablet (1 g total) by mouth 3 (three) times daily. Dissolve in 3-4 tbsp warm water, swish and swallow., Disp: 90 tablet, Rfl: 3 .  vitamin B-12 (CYANOCOBALAMIN) 1000 MCG tablet, Take 1 tablet (1,000 mcg total) by mouth daily., Disp: 30 tablet, Rfl: 1 .  dexamethasone (DECADRON) 4  MG tablet, Take 2 tablets (8 mg total) by mouth daily. Start the day after carboplatin chemotherapy for 3 days. (Patient not taking: Reported on 12/31/2019), Disp: 30 tablet, Rfl: 1 .  Oxycodone HCl 10 MG TABS, Take 1 tablet (10 mg total) by mouth every 4 (four) hours  as needed., Disp: 60 tablet, Rfl: 0  CT Chest W Contrast  Result Date: 12/20/2019 CLINICAL DATA:  Lung cancer.  Restaging. EXAM: CT CHEST, ABDOMEN, AND PELVIS WITH CONTRAST TECHNIQUE: Multidetector CT imaging of the chest, abdomen and pelvis was performed following the standard protocol during bolus administration of intravenous contrast. CONTRAST:  143m OMNIPAQUE IOHEXOL 300 MG/ML  SOLN COMPARISON:  Abdomen/pelvis CT 10/03/2019. PET-CT 09/20/2019. Chest CT 06/16/2009. FINDINGS: CT CHEST FINDINGS Cardiovascular: The heart size is normal. No substantial pericardial effusion. Atherosclerotic calcification is noted in the wall of the thoracic aorta. Right Port-A-Cath tip is positioned in the upper right atrium. Mediastinum/Nodes: Stable 14 mm left thyroid nodule since 06/16/2009, compatible with benign etiology. No mediastinal lymphadenopathy. 10 mm short axis subcarinal node on 27/2 is upper normal for size. There is no hilar lymphadenopathy. The esophagus has normal imaging features. There is no axillary lymphadenopathy. Lungs/Pleura: Centrilobular emphsyema noted.Right lower lobe pulmonary mass is smaller today measuring 1.8 x 2.1 cm compared to chest CT 06/16/2009. No new pulmonary nodule or mass. No effusion. Musculoskeletal: No worrisome lytic or sclerotic osseous abnormality. CT ABDOMEN PELVIS FINDINGS Hepatobiliary: No suspicious focal abnormality within the liver parenchyma. A tiny hypodensity in the right liver is stable in the interval. There is no evidence for gallstones, gallbladder wall thickening, or pericholecystic fluid. No intrahepatic or extrahepatic biliary dilation. Pancreas: No focal mass lesion. No dilatation of the main duct.  No intraparenchymal cyst. No peripancreatic edema. Spleen: No splenomegaly. No focal mass lesion. Adrenals/Urinary Tract: Right lower lobe pulmonary mass is smaller today measuring 1.8 x 2.1 cm compared to 3.6 x 3.1 cm on the previous PET-CT. The left adrenal lesion is 3.3 x 2.4 cm today which compares to 3.3 x 2.4 cm when I remeasure in a similar fashion on the previous study. Stable right renal cyst. Kidneys otherwise unremarkable. No evidence for hydroureter. The urinary bladder appears normal for the degree of distention. Stomach/Bowel: Stomach is unremarkable. No gastric wall thickening. No evidence of outlet obstruction. Duodenum is normally positioned as is the ligament of Treitz. No small bowel wall thickening. No small bowel dilatation. The terminal ileum is normal. The appendix is normal. No gross colonic mass. No colonic wall thickening. Vascular/Lymphatic: There is abdominal aortic atherosclerosis without aneurysm. There is no gastrohepatic or hepatoduodenal ligament lymphadenopathy. No retroperitoneal or mesenteric lymphadenopathy. No pelvic sidewall lymphadenopathy. Reproductive: The uterus is unremarkable.  There is no adnexal mass. Other: No intraperitoneal free fluid. Musculoskeletal: No worrisome lytic or sclerotic osseous abnormality. Superior endplate compression deformity at T12 is stable. IMPRESSION: 1. Interval decrease in size of the right lower lobe pulmonary mass with of all vein adjacent radiation fibrosis. 2. Interval decrease in size of the right adrenal metastatic lesion with similar appearance of the left adrenal metastasis. 3. No new or progressive findings in the chest, abdomen, or pelvis on today's study to suggest new sites of metastatic involvement. 4.  Aortic Atherosclerois (ICD10-170.0) Electronically Signed   By: EMisty StanleyM.D.   On: 12/20/2019 14:45   CT Abdomen Pelvis W Contrast  Result Date: 12/20/2019 CLINICAL DATA:  Lung cancer.  Restaging. EXAM: CT CHEST, ABDOMEN,  AND PELVIS WITH CONTRAST TECHNIQUE: Multidetector CT imaging of the chest, abdomen and pelvis was performed following the standard protocol during bolus administration of intravenous contrast. CONTRAST:  1060mOMNIPAQUE IOHEXOL 300 MG/ML  SOLN COMPARISON:  Abdomen/pelvis CT 10/03/2019. PET-CT 09/20/2019. Chest CT 06/16/2009. FINDINGS: CT CHEST FINDINGS Cardiovascular: The heart size is normal. No substantial pericardial effusion. Atherosclerotic  calcification is noted in the wall of the thoracic aorta. Right Port-A-Cath tip is positioned in the upper right atrium. Mediastinum/Nodes: Stable 14 mm left thyroid nodule since 06/16/2009, compatible with benign etiology. No mediastinal lymphadenopathy. 10 mm short axis subcarinal node on 27/2 is upper normal for size. There is no hilar lymphadenopathy. The esophagus has normal imaging features. There is no axillary lymphadenopathy. Lungs/Pleura: Centrilobular emphsyema noted.Right lower lobe pulmonary mass is smaller today measuring 1.8 x 2.1 cm compared to chest CT 06/16/2009. No new pulmonary nodule or mass. No effusion. Musculoskeletal: No worrisome lytic or sclerotic osseous abnormality. CT ABDOMEN PELVIS FINDINGS Hepatobiliary: No suspicious focal abnormality within the liver parenchyma. A tiny hypodensity in the right liver is stable in the interval. There is no evidence for gallstones, gallbladder wall thickening, or pericholecystic fluid. No intrahepatic or extrahepatic biliary dilation. Pancreas: No focal mass lesion. No dilatation of the main duct. No intraparenchymal cyst. No peripancreatic edema. Spleen: No splenomegaly. No focal mass lesion. Adrenals/Urinary Tract: Right lower lobe pulmonary mass is smaller today measuring 1.8 x 2.1 cm compared to 3.6 x 3.1 cm on the previous PET-CT. The left adrenal lesion is 3.3 x 2.4 cm today which compares to 3.3 x 2.4 cm when I remeasure in a similar fashion on the previous study. Stable right renal cyst. Kidneys  otherwise unremarkable. No evidence for hydroureter. The urinary bladder appears normal for the degree of distention. Stomach/Bowel: Stomach is unremarkable. No gastric wall thickening. No evidence of outlet obstruction. Duodenum is normally positioned as is the ligament of Treitz. No small bowel wall thickening. No small bowel dilatation. The terminal ileum is normal. The appendix is normal. No gross colonic mass. No colonic wall thickening. Vascular/Lymphatic: There is abdominal aortic atherosclerosis without aneurysm. There is no gastrohepatic or hepatoduodenal ligament lymphadenopathy. No retroperitoneal or mesenteric lymphadenopathy. No pelvic sidewall lymphadenopathy. Reproductive: The uterus is unremarkable.  There is no adnexal mass. Other: No intraperitoneal free fluid. Musculoskeletal: No worrisome lytic or sclerotic osseous abnormality. Superior endplate compression deformity at T12 is stable. IMPRESSION: 1. Interval decrease in size of the right lower lobe pulmonary mass with of all vein adjacent radiation fibrosis. 2. Interval decrease in size of the right adrenal metastatic lesion with similar appearance of the left adrenal metastasis. 3. No new or progressive findings in the chest, abdomen, or pelvis on today's study to suggest new sites of metastatic involvement. 4.  Aortic Atherosclerois (ICD10-170.0) Electronically Signed   By: Misty Stanley M.D.   On: 12/20/2019 14:45   DG Chest Portable 1 View  Result Date: 12/27/2019 CLINICAL DATA:  59 year old female with fever. Right lower lobe mass status post radiation. EXAM: PORTABLE CHEST 1 VIEW COMPARISON:  Chest radiograph dated 10/03/2019. CT of the chest abdomen pelvis dated 12/20/2019. FINDINGS: Right-sided Port-A-Cath with tip at the cavoatrial junction. Right lung base linear and platelike atelectasis/scarring or fibrosis related to post radiation changes of the known right lower lobe mass. The mass seen on the prior radiograph of 10/03/2019 is  not as conspicuous on the current study. No new consolidative changes. There is no pleural effusion or pneumothorax. The cardiac silhouette is within normal limits. No acute osseous pathology. IMPRESSION: 1. No acute cardiopulmonary process. 2. Right lung base atelectasis/scarring or post radiation fibrosis. The previously seen right lung base mass is not as conspicuous on today's exam. Electronically Signed   By: Anner Crete M.D.   On: 12/27/2019 17:38    No images are attached to the encounter.   CMP  Latest Ref Rng & Units 12/27/2019  Glucose 70 - 99 mg/dL 75  BUN 6 - 20 mg/dL 11  Creatinine 0.44 - 1.00 mg/dL 0.37(L)  Sodium 135 - 145 mmol/L 136  Potassium 3.5 - 5.1 mmol/L 4.1  Chloride 98 - 111 mmol/L 100  CO2 22 - 32 mmol/L 27  Calcium 8.9 - 10.3 mg/dL 8.7(L)  Total Protein 6.5 - 8.1 g/dL 6.8  Total Bilirubin 0.3 - 1.2 mg/dL 1.0  Alkaline Phos 38 - 126 U/L 111  AST 15 - 41 U/L 17  ALT 0 - 44 U/L 17   CBC Latest Ref Rng & Units 12/27/2019  WBC 4.0 - 10.5 K/uL 44.1(H)  Hemoglobin 12.0 - 15.0 g/dL 10.9(L)  Hematocrit 36.0 - 46.0 % 34.9(L)  Platelets 150 - 400 K/uL 331     Assessment and plan: Patient is a 59 year old female with stage IV lung cancer s/p 3 cycles of carbotaxol Keytruda.  This is a post hospital discharge follow-up visit  Since her hospital discharge patient has had a better control of her nausea and pain.  She will continue using Zyprexa and as needed nausea meds.  She is also on fentanyl 25 mcg patch and as needed oxycodone.  Patient has not had any recurrence of skin rash and has therefore not required to use prednisone.  Patient sister reports that she is eating and drinking well and I therefore do not foresee the need to bring her in this week for IV fluids.  She will be seeing NP Altha Harm next week and I will see her back in 2 weeks for cycle 4 of carbotaxol and Keytruda  Follow-up instructions: As above  I discussed the assessment and treatment plan  with the patient. The patient was provided an opportunity to ask questions and all were answered. The patient agreed with the plan and demonstrated an understanding of the instructions.   The patient was advised to call back or seek an in-person evaluation if the symptoms worsen or if the condition fails to improve as anticipated.    Visit Diagnosis: 1. 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 Malignant neoplasm of lower lobe of right lung ([place])   3. Chemotherapy-induced nausea   4. Neoplasm related pain     Dr. Randa Evens, MD, MPH [facility] Tel- [phone] 01/01/2020 11:58 AM

## 2020-01-03 ENCOUNTER — Ambulatory Visit: Payer: Medicare Other | Admitting: Oncology

## 2020-01-03 ENCOUNTER — Ambulatory Visit: Payer: Medicare Other

## 2020-01-03 ENCOUNTER — Inpatient Hospital Stay: Payer: Medicare Other

## 2020-01-03 NOTE — Progress Notes (Signed)
Nutrition  Called patient for nutrition follow-up.  No answer.  Left message with call back information.   Yazid Pop B. Zenia Resides, [place], [place] Registered Dietitian [phone] (pager)

## 2020-01-04 ENCOUNTER — Telehealth: Payer: Self-pay | Admitting: *Deleted

## 2020-01-04 ENCOUNTER — Inpatient Hospital Stay (HOSPITAL_BASED_OUTPATIENT_CLINIC_OR_DEPARTMENT_OTHER): Payer: Medicare Other | Admitting: Hospice and Palliative Medicine

## 2020-01-04 ENCOUNTER — Encounter: Payer: Self-pay | Admitting: Hospice and Palliative Medicine

## 2020-01-04 ENCOUNTER — Other Ambulatory Visit: Payer: Self-pay

## 2020-01-04 VITALS — BP 117/76 | HR 70 | Temp 97.4°F | Resp 18

## 2020-01-04 DIAGNOSIS — C7972 Secondary malignant neoplasm of left adrenal gland: Secondary | ICD-10-CM | POA: Diagnosis not present

## 2020-01-04 DIAGNOSIS — C7971 Secondary malignant neoplasm of right adrenal gland: Secondary | ICD-10-CM

## 2020-01-04 DIAGNOSIS — G893 Neoplasm related pain (acute) (chronic): Secondary | ICD-10-CM | POA: Diagnosis not present

## 2020-01-04 DIAGNOSIS — Z5111 Encounter for antineoplastic chemotherapy: Secondary | ICD-10-CM | POA: Diagnosis not present

## 2020-01-04 DIAGNOSIS — Z515 Encounter for palliative care: Secondary | ICD-10-CM

## 2020-01-04 NOTE — Progress Notes (Signed)
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  PMH is also notable for muscular dystrophy followed by the neuromuscular clinic at [facility], anxiety, history of hypothyroidism, recurrent hyperthermia, and history of lower extremity DVT.    Patient is status post whole brain radiation and currently on chemotherapy with carboplatin and paclitaxel.  Patient reports several days of lower back/spinal pain when she stands for prolonged period.  She says that the pain is almost immediately resolved when she sits and rests.  She denies radiculopathy or change in weakness.  No UTI symptoms.  Overall, patient says that her abdominal pain is markedly improved with use of transdermal fentanyl but she continues to also take the oxycodone about every 4 hours.  Of note, patient does describe history of chronic lower back pain associated with an injury about 4 years ago.   ECOG FS: 2-3  Review of systems- Review of Systems  Constitutional: Positive for malaise/fatigue. Negative for chills, fever and weight loss.  Cardiovascular: Negative for chest pain.  Gastrointestinal: Negative for abdominal pain, constipation, diarrhea, nausea and vomiting.  Genitourinary: Negative for dysuria.  Musculoskeletal: Positive for back pain. Negative for falls, myalgias and neck pain.  Skin: Negative for itching and rash.  Neurological: Positive for weakness. Negative  for dizziness, tingling, tremors, focal weakness, loss of consciousness and headaches.  Psychiatric/Behavioral: Negative for depression and suicidal ideas. The patient is not nervous/anxious.      Current treatment-paclitaxel/carboplatin  Allergies  Allergen Reactions  . Paxil [Paroxetine Hcl] Other (See Comments)    Hallucinations   . Ciprofloxacin Other (See Comments)    Unknown  . Levaquin [Levofloxacin] Other (See Comments)    Unknown   . 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 She has an increase risk of malignant hyperthermia with anesthesia. She should not receive Propofol.   Donne Hazel [Nefazodone] Other (See Comments)    Unknown   . Biaxin [Clarithromycin] Nausea Only  . Erythromycin Nausea Only  . Penicillins Rash    Past Medical History:  Diagnosis Date  . Anxiety   . Arthritis   . Chronic kidney disease   . Family history of breast cancer    aunt  . GERD (gastroesophageal reflux disease)   . History of DVT of lower extremity    Left leg  . Lung cancer ([place])    with brain mets  . Lung mass   . Malignant hyperthermia   . 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[place])    mild form - per patient central cord disease  . Muscular dystrophy [facility])     Past Surgical History:  Procedure Laterality Date  . CESAREAN SECTION  11/1985  . COLONOSCOPY N/A 10/27/2015   Procedure: COLONOSCOPY;  Surgeon: Manya Silvas, MD;  Location: [facility] ENDOSCOPY;  Service: Endoscopy;  Laterality: N/A;   NO Propofol - per office  . LITHOTRIPSY    . PORTA CATH INSERTION N/A  11/09/2019   Procedure: PORTA CATH INSERTION;  Surgeon: Algernon Huxley, MD;  Location: [place] CV LAB;  Service: Cardiovascular;  Laterality: N/A;  . TUBAL LIGATION  03/1986    Social History   Socioeconomic History  . Marital status: Divorced    Spouse name: Not on file  . Number of children: Not on file  . Years of education: Not on file  . Highest education  level: Not on file  Occupational History  . Not on file  Tobacco Use  . Smoking status: Former Smoker    Packs/day: 1.00    Types: Cigarettes    Quit date: 05/2016    Years since quitting: 3.6  . Smokeless tobacco: Never Used  Substance and Sexual Activity  . Alcohol use: No  . Drug use: No  . Sexual activity: Not on file  Other Topics Concern  . Not on file  Social History Narrative  . Not on file   Social Determinants of Health   Financial Resource Strain:   . Difficulty of Paying Living Expenses: Not on file  Food Insecurity:   . Worried About Charity fundraiser in the Last Year: Not on file  . Ran Out of Food in the Last Year: Not on file  Transportation Needs:   . Lack of Transportation (Medical): Not on file  . Lack of Transportation (Non-Medical): Not on file  Physical Activity:   . Days of Exercise per Week: Not on file  . Minutes of Exercise per Session: Not on file  Stress:   . Feeling of Stress : Not on file  Social Connections:   . Frequency of Communication with Friends and Family: Not on file  . Frequency of Social Gatherings with Friends and Family: Not on file  . Attends Religious Services: Not on file  . Active Member of Clubs or Organizations: Not on file  . Attends Archivist Meetings: Not on file  . Marital Status: Not on file  Intimate Partner Violence:   . Fear of Current or Ex-Partner: Not on file  . Emotionally Abused: Not on file  . Physically Abused: Not on file  . Sexually Abused: Not on file    Family History  Problem Relation Age of Onset  . Cancer Father        bladder, lung  . Breast cancer Maternal Aunt 45     Current Outpatient Medications:  .  acetaminophen (TYLENOL) 325 MG tablet, Take 650 mg by mouth every 6 (six) hours as needed., Disp: , Rfl:  .  ALPRAZolam (XANAX) 0.25 MG tablet, Take 0.25 mg by mouth at bedtime as needed for anxiety., Disp: , Rfl:  .  apixaban (ELIQUIS) 5 MG TABS tablet, Take 1 tablet (5 mg  total) by mouth 2 (two) times daily., Disp: 60 tablet, Rfl: 2 .  citalopram (CELEXA) 40 MG tablet, Take 1 tablet (40 mg total) by mouth daily., Disp: 30 tablet, Rfl: 3 .  dexamethasone (DECADRON) 4 MG tablet, Take 2 tablets (8 mg total) by mouth daily. Start the day after carboplatin chemotherapy for 3 days. (Patient not taking: Reported on 12/31/2019), Disp: 30 tablet, Rfl: 1 .  fluticasone (FLONASE) 50 MCG/ACT nasal spray, Place 2 sprays into both nostrils daily as needed. , Disp: , Rfl:  .  lidocaine-prilocaine (EMLA) cream, Apply to affected area once, Disp: 30 g, Rfl: 3 .  magic mouthwash SOLN, Take 5 mLs by mouth 3 (three) times daily as needed for mouth pain., Disp:  300 mL, Rfl: 1 .  methimazole (TAPAZOLE) 5 MG tablet, Take 5 mg by mouth daily., Disp: , Rfl:  .  metoprolol succinate (TOPROL-XL) 25 MG 24 hr tablet, Take 12.5 tablets by mouth daily., Disp: , Rfl:  .  nystatin (MYCOSTATIN) 100000 UNIT/ML suspension, Take 5 mLs (500,000 Units total) by mouth 4 (four) times daily. Swish and spit., Disp: 300 mL, Rfl: 0 .  ondansetron (ZOFRAN) 8 MG tablet, Take 1 tablet (8 mg total) by mouth 2 (two) times daily as needed for refractory nausea / vomiting. Start on day 3 after carboplatin chemo., Disp: 30 tablet, Rfl: 1 .  Oxycodone HCl 10 MG TABS, Take 1 tablet (10 mg total) by mouth every 4 (four) hours as needed., Disp: 60 tablet, Rfl: 0 .  pantoprazole (PROTONIX) 20 MG tablet, TAKE 1 TABLET BY MOUTH 2 TIMES DAILY, Disp: 120 tablet, Rfl: 0 .  prochlorperazine (COMPAZINE) 10 MG tablet, Take 1 tablet (10 mg total) by mouth every 6 (six) hours as needed (Nausea or vomiting)., Disp: 30 tablet, Rfl: 1 .  sucralfate (CARAFATE) 1 g tablet, Take 1 tablet (1 g total) by mouth 3 (three) times daily. Dissolve in 3-4 tbsp warm water, swish and swallow., Disp: 90 tablet, Rfl: 3 .  vitamin B-12 (CYANOCOBALAMIN) 1000 MCG tablet, Take 1 tablet (1,000 mcg total) by mouth daily., Disp: 30 tablet, Rfl: 1  Physical  exam:  There were no vitals filed for this visit. Physical Exam Constitutional:      Appearance: Normal appearance.  HENT:     Head: Normocephalic and atraumatic.     Mouth/Throat:     Mouth: Mucous membranes are dry.  Eyes:     Pupils: Pupils are equal, round, and reactive to light.  Pulmonary:     Effort: Pulmonary effort is normal.  Abdominal:     General: Abdomen is flat.     Palpations: Abdomen is soft.  Musculoskeletal:        General: No tenderness or signs of injury. Normal range of motion.     Cervical back: Normal range of motion and neck supple.     Right lower leg: No edema.     Left lower leg: No edema.     Comments: No focal tenderness along the spine.  Good sensation on bilateral lower extremities.  Generalized weakness  Skin:    General: Skin is warm and dry.  Neurological:     General: No focal deficit present.     Mental Status: She is alert and oriented to person, place, and time. Mental status is at baseline.     Comments: Generalized weakness  Psychiatric:        Mood and Affect: Mood normal.      CMP Latest Ref Rng & Units 12/27/2019  Glucose 70 - 99 mg/dL 75  BUN 6 - 20 mg/dL 11  Creatinine 0.44 - 1.00 mg/dL 0.37(L)  Sodium 135 - 145 mmol/L 136  Potassium 3.5 - 5.1 mmol/L 4.1  Chloride 98 - 111 mmol/L 100  CO2 22 - 32 mmol/L 27  Calcium 8.9 - 10.3 mg/dL 8.7(L)  Total Protein 6.5 - 8.1 g/dL 6.8  Total Bilirubin 0.3 - 1.2 mg/dL 1.0  Alkaline Phos 38 - 126 U/L 111  AST 15 - 41 U/L 17  ALT 0 - 44 U/L 17   CBC Latest Ref Rng & Units 12/27/2019  WBC 4.0 - 10.5 K/uL 44.1(H)  Hemoglobin 12.0 - 15.0 g/dL 10.9(L)  Hematocrit 36.0 - 46.0 % 34.9(L)  Platelets 150 - 400 K/uL 331    No images are attached to the encounter.  CT Chest W Contrast  Result Date: 12/20/2019 CLINICAL DATA:  Lung cancer.  Restaging. EXAM: CT CHEST, ABDOMEN, AND PELVIS WITH CONTRAST TECHNIQUE: Multidetector CT imaging of the chest, abdomen and pelvis was performed following  the standard protocol during bolus administration of intravenous contrast. CONTRAST:  168mL OMNIPAQUE IOHEXOL 300 MG/ML  SOLN COMPARISON:  Abdomen/pelvis CT 10/03/2019. PET-CT 09/20/2019. Chest CT 06/16/2009. FINDINGS: CT CHEST FINDINGS Cardiovascular: The heart size is normal. No substantial pericardial effusion. Atherosclerotic calcification is noted in the wall of the thoracic aorta. Right Port-A-Cath tip is positioned in the upper right atrium. Mediastinum/Nodes: Stable 14 mm left thyroid nodule since 06/16/2009, compatible with benign etiology. No mediastinal lymphadenopathy. 10 mm short axis subcarinal node on 27/2 is upper normal for size. There is no hilar lymphadenopathy. The esophagus has normal imaging features. There is no axillary lymphadenopathy. Lungs/Pleura: Centrilobular emphsyema noted.Right lower lobe pulmonary mass is smaller today measuring 1.8 x 2.1 cm compared to chest CT 06/16/2009. No new pulmonary nodule or mass. No effusion. Musculoskeletal: No worrisome lytic or sclerotic osseous abnormality. CT ABDOMEN PELVIS FINDINGS Hepatobiliary: No suspicious focal abnormality within the liver parenchyma. A tiny hypodensity in the right liver is stable in the interval. There is no evidence for gallstones, gallbladder wall thickening, or pericholecystic fluid. No intrahepatic or extrahepatic biliary dilation. Pancreas: No focal mass lesion. No dilatation of the main duct. No intraparenchymal cyst. No peripancreatic edema. Spleen: No splenomegaly. No focal mass lesion. Adrenals/Urinary Tract: Right lower lobe pulmonary mass is smaller today measuring 1.8 x 2.1 cm compared to 3.6 x 3.1 cm on the previous PET-CT. The left adrenal lesion is 3.3 x 2.4 cm today which compares to 3.3 x 2.4 cm when I remeasure in a similar fashion on the previous study. Stable right renal cyst. Kidneys otherwise unremarkable. No evidence for hydroureter. The urinary bladder appears normal for the degree of distention.  Stomach/Bowel: Stomach is unremarkable. No gastric wall thickening. No evidence of outlet obstruction. Duodenum is normally positioned as is the ligament of Treitz. No small bowel wall thickening. No small bowel dilatation. The terminal ileum is normal. The appendix is normal. No gross colonic mass. No colonic wall thickening. Vascular/Lymphatic: There is abdominal aortic atherosclerosis without aneurysm. There is no gastrohepatic or hepatoduodenal ligament lymphadenopathy. No retroperitoneal or mesenteric lymphadenopathy. No pelvic sidewall lymphadenopathy. Reproductive: The uterus is unremarkable.  There is no adnexal mass. Other: No intraperitoneal free fluid. Musculoskeletal: No worrisome lytic or sclerotic osseous abnormality. Superior endplate compression deformity at T12 is stable. IMPRESSION: 1. Interval decrease in size of the right lower lobe pulmonary mass with of all vein adjacent radiation fibrosis. 2. Interval decrease in size of the right adrenal metastatic lesion with similar appearance of the left adrenal metastasis. 3. No new or progressive findings in the chest, abdomen, or pelvis on today's study to suggest new sites of metastatic involvement. 4.  Aortic Atherosclerois (ICD10-170.0) Electronically Signed   By: Misty Stanley M.D.   On: 12/20/2019 14:45   CT Abdomen Pelvis W Contrast  Result Date: 12/20/2019 CLINICAL DATA:  Lung cancer.  Restaging. EXAM: CT CHEST, ABDOMEN, AND PELVIS WITH CONTRAST TECHNIQUE: Multidetector CT imaging of the chest, abdomen and pelvis was performed following the standard protocol during bolus administration of intravenous contrast. CONTRAST:  156mL OMNIPAQUE IOHEXOL 300 MG/ML  SOLN COMPARISON:  Abdomen/pelvis CT 10/03/2019. PET-CT 09/20/2019. Chest CT 06/16/2009. FINDINGS: CT CHEST FINDINGS Cardiovascular: The  heart size is normal. No substantial pericardial effusion. Atherosclerotic calcification is noted in the wall of the thoracic aorta. Right Port-A-Cath tip  is positioned in the upper right atrium. Mediastinum/Nodes: Stable 14 mm left thyroid nodule since 06/16/2009, compatible with benign etiology. No mediastinal lymphadenopathy. 10 mm short axis subcarinal node on 27/2 is upper normal for size. There is no hilar lymphadenopathy. The esophagus has normal imaging features. There is no axillary lymphadenopathy. Lungs/Pleura: Centrilobular emphsyema noted.Right lower lobe pulmonary mass is smaller today measuring 1.8 x 2.1 cm compared to chest CT 06/16/2009. No new pulmonary nodule or mass. No effusion. Musculoskeletal: No worrisome lytic or sclerotic osseous abnormality. CT ABDOMEN PELVIS FINDINGS Hepatobiliary: No suspicious focal abnormality within the liver parenchyma. A tiny hypodensity in the right liver is stable in the interval. There is no evidence for gallstones, gallbladder wall thickening, or pericholecystic fluid. No intrahepatic or extrahepatic biliary dilation. Pancreas: No focal mass lesion. No dilatation of the main duct. No intraparenchymal cyst. No peripancreatic edema. Spleen: No splenomegaly. No focal mass lesion. Adrenals/Urinary Tract: Right lower lobe pulmonary mass is smaller today measuring 1.8 x 2.1 cm compared to 3.6 x 3.1 cm on the previous PET-CT. The left adrenal lesion is 3.3 x 2.4 cm today which compares to 3.3 x 2.4 cm when I remeasure in a similar fashion on the previous study. Stable right renal cyst. Kidneys otherwise unremarkable. No evidence for hydroureter. The urinary bladder appears normal for the degree of distention. Stomach/Bowel: Stomach is unremarkable. No gastric wall thickening. No evidence of outlet obstruction. Duodenum is normally positioned as is the ligament of Treitz. No small bowel wall thickening. No small bowel dilatation. The terminal ileum is normal. The appendix is normal. No gross colonic mass. No colonic wall thickening. Vascular/Lymphatic: There is abdominal aortic atherosclerosis without aneurysm. There is  no gastrohepatic or hepatoduodenal ligament lymphadenopathy. No retroperitoneal or mesenteric lymphadenopathy. No pelvic sidewall lymphadenopathy. Reproductive: The uterus is unremarkable.  There is no adnexal mass. Other: No intraperitoneal free fluid. Musculoskeletal: No worrisome lytic or sclerotic osseous abnormality. Superior endplate compression deformity at T12 is stable. IMPRESSION: 1. Interval decrease in size of the right lower lobe pulmonary mass with of all vein adjacent radiation fibrosis. 2. Interval decrease in size of the right adrenal metastatic lesion with similar appearance of the left adrenal metastasis. 3. No new or progressive findings in the chest, abdomen, or pelvis on today's study to suggest new sites of metastatic involvement. 4.  Aortic Atherosclerois (ICD10-170.0) Electronically Signed   By: Misty Stanley M.D.   On: 12/20/2019 14:45   DG Chest Portable 1 View  Result Date: 12/27/2019 CLINICAL DATA:  59 year old female with fever. Right lower lobe mass status post radiation. EXAM: PORTABLE CHEST 1 VIEW COMPARISON:  Chest radiograph dated 10/03/2019. CT of the chest abdomen pelvis dated 12/20/2019. FINDINGS: Right-sided Port-A-Cath with tip at the cavoatrial junction. Right lung base linear and platelike atelectasis/scarring or fibrosis related to post radiation changes of the known right lower lobe mass. The mass seen on the prior radiograph of 10/03/2019 is not as conspicuous on the current study. No new consolidative changes. There is no pleural effusion or pneumothorax. The cardiac silhouette is within normal limits. No acute osseous pathology. IMPRESSION: 1. No acute cardiopulmonary process. 2. Right lung base atelectasis/scarring or post radiation fibrosis. The previously seen right lung base mass is not as conspicuous on today's exam. Electronically Signed   By: Anner Crete M.D.   On: 12/27/2019 17:38    Assessment  and plan- Patient is a 59 y.o. female stage IV squamous  cell carcinoma of the lung metastatic to brain status post whole brain radiation and on current treatment with systemic carboplatin/paclitaxel chemotherapy.  Patient presents to this [facility] for evaluation of lower back pain.  Stage IV squamous cell carcinoma of the lung -on current treatment with carboplatin and Taxol.  Recent CT 12/20/2019 revealed interval decrease in size of right lower lobe mass, right adrenal metastatic lesion, and similar appearance of the left adrenal metastasis without new or progressive findings.  Neoplasm related pain -continue transdermal fentanyl and oxycodone for breakthrough pain.  Lower back pain is most likely musculoskeletal in etiology.  Could consider MRI of the lumbar spine if needed.  Recommend use of heat pads as needed.  Could consider referral back to PT.  Depression/anxiety -moods were better today.  Continue citalopram, alprazolam and olanzapine  Nausea -improved on olanzapine.  Weakness -recently completed home health PT.  Pending Medicaid PCS.   Case and plan discussed with Dr. Janese Banks  Follow-up telephone visit next week   Patient expressed understanding and was in agreement with this plan. She also understands that She can call clinic at any time with any questions, concerns, or complaints.   Thank you for allowing me to participate in the care of this very pleasant patient.   Time Total: 25 minutes  Visit consisted of counseling and education dealing with the complex and emotionally intense issues of symptom management and palliative care in the setting of serious and potentially life-threatening illness.Greater than 50%  of this time was spent counseling and coordinating care related to the above assessment and plan.  Signed by: Altha Harm, PhD, NP-C

## 2020-01-04 NOTE — Telephone Encounter (Signed)
Pt's sister called in to report that pt is having worsening back pain. She is unable to stand for long periods of time which is starting to effect her quality of life. Asking if pt needs any further imaging or if Dr. Janese Banks has any advice on how to manage her pain. Pt has an appt scheduled for Monday with Josh.

## 2020-01-04 NOTE — Telephone Encounter (Signed)
Pt given appt to see Josh this afternoon at 2:30pm.

## 2020-01-07 ENCOUNTER — Inpatient Hospital Stay: Payer: Medicare Other | Admitting: Hospice and Palliative Medicine

## 2020-01-08 ENCOUNTER — Inpatient Hospital Stay: Payer: Medicare Other | Attending: Hospice and Palliative Medicine | Admitting: Hospice and Palliative Medicine

## 2020-01-08 ENCOUNTER — Other Ambulatory Visit: Payer: Self-pay

## 2020-01-08 DIAGNOSIS — Z5111 Encounter for antineoplastic chemotherapy: Secondary | ICD-10-CM | POA: Insufficient documentation

## 2020-01-08 DIAGNOSIS — C7972 Secondary malignant neoplasm of left adrenal gland: Secondary | ICD-10-CM

## 2020-01-08 DIAGNOSIS — C3431 Malignant neoplasm of lower lobe, right bronchus or lung: Secondary | ICD-10-CM | POA: Insufficient documentation

## 2020-01-08 DIAGNOSIS — Z79899 Other long term (current) drug therapy: Secondary | ICD-10-CM | POA: Insufficient documentation

## 2020-01-08 DIAGNOSIS — C781 Secondary malignant neoplasm of mediastinum: Secondary | ICD-10-CM | POA: Insufficient documentation

## 2020-01-08 DIAGNOSIS — C7971 Secondary malignant neoplasm of right adrenal gland: Secondary | ICD-10-CM

## 2020-01-08 DIAGNOSIS — Z515 Encounter for palliative care: Secondary | ICD-10-CM

## 2020-01-08 DIAGNOSIS — G893 Neoplasm related pain (acute) (chronic): Secondary | ICD-10-CM | POA: Insufficient documentation

## 2020-01-08 DIAGNOSIS — R110 Nausea: Secondary | ICD-10-CM | POA: Insufficient documentation

## 2020-01-08 DIAGNOSIS — R531 Weakness: Secondary | ICD-10-CM | POA: Insufficient documentation

## 2020-01-08 DIAGNOSIS — C7931 Secondary malignant neoplasm of brain: Secondary | ICD-10-CM | POA: Insufficient documentation

## 2020-01-09 NOTE — Progress Notes (Signed)
Virtual Visit via Telephone Note  I connected with Deborah Nguyen on 01/09/20 at  8:30 AM EST by telephone and verified that I am speaking with the correct person using two identifiers.   I discussed the limitations, risks, security and privacy concerns of performing an evaluation and management service by telephone and the availability of in person appointments. I also discussed with the patient that there may be a patient responsible charge related to this service. The patient expressed understanding and agreed to proceed.   History of Present Illness: Ms. Deborah Nguyen is a 59 y.o. 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  PMH is also notable for muscular dystrophy followed by neuromuscular clinic at [facility], anxiety, history of hypothyroidism, recurrent hyperthermia, and history of lower extremity DVT.  Patient was diagnosed with stage IV lung cancer after having right flank pain over several months.  She ultimately underwent PET/CT on 09/20/2019 with findings of hypermetabolic mass in the right lower lobe, supraclavicular/mediastinal/right hilar lymph nodes, and bilateral adrenal glands.  Patient was also found to have several brain masses on MRI.  Patient is receiving whole brain radiation.  There has been discussion about systemic treatment.  Palliative care was consulted to help address goals and manage ongoing symptoms.   Observations/Objective: Called spoke with patient by phone.  She reports that she is unchanged.  She says that her usual pain is almost completely gone as long as she takes her pain meds.  She does continue to have occasional lower back pain when she is standing for prolonged periods but it is relieved when she sits.  She denies any significant changes or concerns today.  Assessment and Plan: Stage IV squamous cell carcinoma the lung -on current treatment with carboplatin/paclitaxel/pembrolizumab.   Follow-up appoint with Dr. Janese Banks next week  Neoplasm related pain -continue oxycodone/fentanyl  Nausea -improved with use of ondansetron and Zyprexa  Weakness -Home health following. Pending PCS referral.   Follow Up Instructions: Follow-up telephone visit 2 weeks   I discussed the assessment and treatment plan with the patient. The patient was provided an opportunity to ask questions and all were answered. The patient agreed with the plan and demonstrated an understanding of the instructions.   The patient was advised to call back or seek an in-person evaluation if the symptoms worsen or if the condition fails to improve as anticipated.  I provided 5 minutes of non-face-to-face time during this encounter.   Irean Hong, NP

## 2020-01-14 ENCOUNTER — Other Ambulatory Visit: Payer: Self-pay

## 2020-01-14 ENCOUNTER — Inpatient Hospital Stay (HOSPITAL_BASED_OUTPATIENT_CLINIC_OR_DEPARTMENT_OTHER): Payer: Medicare Other | Admitting: Oncology

## 2020-01-14 ENCOUNTER — Inpatient Hospital Stay: Payer: Medicare Other

## 2020-01-14 ENCOUNTER — Encounter: Payer: Self-pay | Admitting: Oncology

## 2020-01-14 ENCOUNTER — Telehealth: Payer: Self-pay | Admitting: *Deleted

## 2020-01-14 VITALS — BP 117/67 | HR 80 | Temp 97.2°F | Resp 16 | Wt 141.8 lb

## 2020-01-14 DIAGNOSIS — C781 Secondary malignant neoplasm of mediastinum: Secondary | ICD-10-CM | POA: Diagnosis not present

## 2020-01-14 DIAGNOSIS — Z5112 Encounter for antineoplastic immunotherapy: Secondary | ICD-10-CM

## 2020-01-14 DIAGNOSIS — G893 Neoplasm related pain (acute) (chronic): Secondary | ICD-10-CM | POA: Diagnosis not present

## 2020-01-14 DIAGNOSIS — C7971 Secondary malignant neoplasm of right adrenal gland: Secondary | ICD-10-CM | POA: Diagnosis not present

## 2020-01-14 DIAGNOSIS — Z5111 Encounter for antineoplastic chemotherapy: Secondary | ICD-10-CM | POA: Diagnosis present

## 2020-01-14 DIAGNOSIS — C3431 Malignant neoplasm of lower lobe, right bronchus or lung: Secondary | ICD-10-CM | POA: Diagnosis present

## 2020-01-14 DIAGNOSIS — Z79899 Other long term (current) drug therapy: Secondary | ICD-10-CM | POA: Diagnosis not present

## 2020-01-14 DIAGNOSIS — C7972 Secondary malignant neoplasm of left adrenal gland: Secondary | ICD-10-CM | POA: Diagnosis not present

## 2020-01-14 DIAGNOSIS — R110 Nausea: Secondary | ICD-10-CM

## 2020-01-14 DIAGNOSIS — D6481 Anemia due to antineoplastic chemotherapy: Secondary | ICD-10-CM

## 2020-01-14 DIAGNOSIS — R531 Weakness: Secondary | ICD-10-CM | POA: Diagnosis not present

## 2020-01-14 DIAGNOSIS — Z95828 Presence of other vascular implants and grafts: Secondary | ICD-10-CM

## 2020-01-14 DIAGNOSIS — C7931 Secondary malignant neoplasm of brain: Secondary | ICD-10-CM | POA: Diagnosis not present

## 2020-01-14 DIAGNOSIS — T451X5A Adverse effect of antineoplastic and immunosuppressive drugs, initial encounter: Secondary | ICD-10-CM

## 2020-01-14 LAB — CBC WITH DIFFERENTIAL/PLATELET
Abs Immature Granulocytes: 0.01 10*3/uL (ref 0.00–0.07)
Basophils Absolute: 0 10*3/uL (ref 0.0–0.1)
Basophils Relative: 1 %
Eosinophils Absolute: 0 10*3/uL (ref 0.0–0.5)
Eosinophils Relative: 0 %
HCT: 33 % — ABNORMAL LOW (ref 36.0–46.0)
Hemoglobin: 10.3 g/dL — ABNORMAL LOW (ref 12.0–15.0)
Immature Granulocytes: 0 %
Lymphocytes Relative: 13 %
Lymphs Abs: 0.7 10*3/uL (ref 0.7–4.0)
MCH: 31.7 pg (ref 26.0–34.0)
MCHC: 31.2 g/dL (ref 30.0–36.0)
MCV: 101.5 fL — ABNORMAL HIGH (ref 80.0–100.0)
Monocytes Absolute: 0.8 10*3/uL (ref 0.1–1.0)
Monocytes Relative: 16 %
Neutro Abs: 3.5 10*3/uL (ref 1.7–7.7)
Neutrophils Relative %: 70 %
Platelets: 314 10*3/uL (ref 150–400)
RBC: 3.25 MIL/uL — ABNORMAL LOW (ref 3.87–5.11)
RDW: 18 % — ABNORMAL HIGH (ref 11.5–15.5)
WBC: 5 10*3/uL (ref 4.0–10.5)
nRBC: 0 % (ref 0.0–0.2)

## 2020-01-14 LAB — COMPREHENSIVE METABOLIC PANEL
ALT: 30 U/L (ref 0–44)
AST: 34 U/L (ref 15–41)
Albumin: 3.3 g/dL — ABNORMAL LOW (ref 3.5–5.0)
Alkaline Phosphatase: 103 U/L (ref 38–126)
Anion gap: 9 (ref 5–15)
BUN: 11 mg/dL (ref 6–20)
CO2: 25 mmol/L (ref 22–32)
Calcium: 8.9 mg/dL (ref 8.9–10.3)
Chloride: 104 mmol/L (ref 98–111)
Creatinine, Ser: 0.32 mg/dL — ABNORMAL LOW (ref 0.44–1.00)
GFR calc Af Amer: >60 mL/min (ref >60)
GFR calc non Af Amer: >60 mL/min (ref >60)
Glucose, Bld: 116 mg/dL — ABNORMAL HIGH (ref 70–99)
Potassium: 3.5 mmol/L (ref 3.5–5.1)
Sodium: 138 mmol/L (ref 135–145)
Total Bilirubin: 0.3 mg/dL (ref 0.3–1.2)
Total Protein: 6.7 g/dL (ref 6.5–8.1)

## 2020-01-14 MED ORDER — SODIUM CHLORIDE 0.9 % IV SOLN
200.0000 mg | Freq: Once | INTRAVENOUS | Status: AC
  Administered 2020-01-14: 200 mg via INTRAVENOUS
  Filled 2020-01-14: qty 8

## 2020-01-14 MED ORDER — FAMOTIDINE IN NACL 20-0.9 MG/50ML-% IV SOLN
20.0000 mg | Freq: Once | INTRAVENOUS | Status: AC
  Administered 2020-01-14: 20 mg via INTRAVENOUS
  Filled 2020-01-14: qty 50

## 2020-01-14 MED ORDER — SODIUM CHLORIDE 0.9 % IV SOLN: 750.0000 mg | Freq: Once | INTRAVENOUS | Status: DC

## 2020-01-14 MED ORDER — SODIUM CHLORIDE 0.9 % IV SOLN
500.0000 mg | Freq: Once | INTRAVENOUS | Status: AC
  Administered 2020-01-14: 500 mg via INTRAVENOUS
  Filled 2020-01-14: qty 50

## 2020-01-14 MED ORDER — SODIUM CHLORIDE 0.9 % IV SOLN
Freq: Once | INTRAVENOUS | Status: AC
  Filled 2020-01-14: qty 5

## 2020-01-14 MED ORDER — SODIUM CHLORIDE 0.9% FLUSH
10.0000 mL | Freq: Once | INTRAVENOUS | Status: AC
  Administered 2020-01-14: 10 mL via INTRAVENOUS
  Filled 2020-01-14: qty 10

## 2020-01-14 MED ORDER — HEPARIN SOD (PORK) LOCK FLUSH 100 UNIT/ML IV SOLN
500.0000 [IU] | Freq: Once | INTRAVENOUS | Status: AC
  Administered 2020-01-14: 500 [IU] via INTRAVENOUS
  Filled 2020-01-14: qty 5

## 2020-01-14 MED ORDER — DIPHENHYDRAMINE HCL 50 MG/ML IJ SOLN
50.0000 mg | Freq: Once | INTRAMUSCULAR | Status: AC
  Administered 2020-01-14: 50 mg via INTRAVENOUS
  Filled 2020-01-14: qty 1

## 2020-01-14 MED ORDER — SODIUM CHLORIDE 0.9 % IV SOLN
175.0000 mg/m2 | Freq: Once | INTRAVENOUS | Status: AC
  Administered 2020-01-14: 282 mg via INTRAVENOUS
  Filled 2020-01-14: qty 47

## 2020-01-14 MED ORDER — SODIUM CHLORIDE 0.9 % IV SOLN
Freq: Once | INTRAVENOUS | Status: AC
  Filled 2020-01-14: qty 250

## 2020-01-14 MED ORDER — PALONOSETRON HCL INJECTION 0.25 MG/5ML
0.2500 mg | Freq: Once | INTRAVENOUS | Status: AC
  Administered 2020-01-14: 0.25 mg via INTRAVENOUS
  Filled 2020-01-14: qty 5

## 2020-01-14 NOTE — Progress Notes (Signed)
Pt states she has swelling at eyes, mid thigh to feet. Has back pain when she gets up and tries to move in the lower back and mid at times. Purplish color on legs with swelling at times.

## 2020-01-14 NOTE — Telephone Encounter (Signed)
Called sister of pt to let her know that appt with vascular for her port issue tom. At 9 am. She already knows where [facility] is located.

## 2020-01-15 ENCOUNTER — Other Ambulatory Visit: Payer: Self-pay

## 2020-01-15 ENCOUNTER — Ambulatory Visit (INDEPENDENT_AMBULATORY_CARE_PROVIDER_SITE_OTHER): Payer: Self-pay | Admitting: Vascular Surgery

## 2020-01-15 ENCOUNTER — Ambulatory Visit (INDEPENDENT_AMBULATORY_CARE_PROVIDER_SITE_OTHER): Payer: Medicare Other | Admitting: Vascular Surgery

## 2020-01-15 ENCOUNTER — Inpatient Hospital Stay: Payer: Medicare Other

## 2020-01-15 VITALS — BP 121/71 | HR 86 | Resp 12 | Ht 61.0 in | Wt 139.0 lb

## 2020-01-15 DIAGNOSIS — G7100 Muscular dystrophy, unspecified: Secondary | ICD-10-CM

## 2020-01-15 DIAGNOSIS — Z5111 Encounter for antineoplastic chemotherapy: Secondary | ICD-10-CM | POA: Diagnosis not present

## 2020-01-15 DIAGNOSIS — Z95828 Presence of other vascular implants and grafts: Secondary | ICD-10-CM | POA: Diagnosis not present

## 2020-01-15 DIAGNOSIS — C3431 Malignant neoplasm of lower lobe, right bronchus or lung: Secondary | ICD-10-CM

## 2020-01-15 MED ORDER — PEGFILGRASTIM-CBQV 6 MG/0.6ML ~~LOC~~ SOSY
6.0000 mg | PREFILLED_SYRINGE | Freq: Once | SUBCUTANEOUS | Status: AC
  Administered 2020-01-15: 6 mg via SUBCUTANEOUS

## 2020-01-15 NOTE — Assessment & Plan Note (Signed)
The patient has a Port-A-Cath in place with a small wound which leaves the port exposed.  This is not a stable situation and the port will need to be revised.  It does not appear to be actively infected so I think we can salvage and keep a port in some fashion although I think the pocket may need to be changed.  She has very poor wound healing with her ongoing malignancy and multiple other medical issues suspect that is part of the wound healing process.  This will be scheduled in the near future at her convenience.

## 2020-01-15 NOTE — Progress Notes (Signed)
MRN : [id number]  Deborah Nguyen is a 59 y.o. (10-22-1961) female who presents with chief complaint of  Chief Complaint  Patient presents with  . Follow-up    Port check  .  History of Present Illness: Patient returns today in follow up of her right sided Port-A-Cath.  This was placed over 2 months ago and she is used it for her chemotherapy is every 3 weeks up until now.  When she went in to use it yesterday, they noticed a scab at the medial edge of the incision and when this was removed, there was a small pinhole opening into the wound.  There was some fluid draining from this.  Not any significant erythema.  It is only mildly tender to palpation.  No fevers, chills, or signs of systemic infection.  On examination today, there is a small pinhole opening on the medial aspect of the wound and the port is visible under this opening.  No purulence.  No significant erythema.  Current Outpatient Medications  Medication Sig Dispense Refill  . acetaminophen (TYLENOL) 325 MG tablet Take 650 mg by mouth every 6 (six) hours as needed.    . ALPRAZolam (XANAX) 0.25 MG tablet Take 0.25 mg by mouth at bedtime as needed for anxiety.    Marland Kitchen apixaban (ELIQUIS) 5 MG TABS tablet Take 1 tablet (5 mg total) by mouth 2 (two) times daily. 60 tablet 2  . citalopram (CELEXA) 40 MG tablet Take 1 tablet (40 mg total) by mouth daily. 30 tablet 3  . fluticasone (FLONASE) 50 MCG/ACT nasal spray Place 2 sprays into both nostrils daily as needed.     . lidocaine-prilocaine (EMLA) cream Apply to affected area once 30 g 3  . magic mouthwash SOLN Take 5 mLs by mouth 3 (three) times daily as needed for mouth pain. 300 mL 1  . methimazole (TAPAZOLE) 5 MG tablet Take 5 mg by mouth daily.    . ondansetron (ZOFRAN) 8 MG tablet Take 1 tablet (8 mg total) by mouth 2 (two) times daily as needed for refractory nausea / vomiting. Start on day 3 after carboplatin chemo. 30 tablet 1  . Oxycodone HCl 10 MG TABS Take 1 tablet (10 mg  total) by mouth every 4 (four) hours as needed. 60 tablet 0  . pantoprazole (PROTONIX) 20 MG tablet TAKE 1 TABLET BY MOUTH 2 TIMES DAILY 120 tablet 0  . dexamethasone (DECADRON) 4 MG tablet Take 2 tablets (8 mg total) by mouth daily. Start the day after carboplatin chemotherapy for 3 days. (Patient not taking: Reported on 01/14/2020) 30 tablet 1  . fentaNYL (DURAGESIC) 12 MCG/HR Place 1 patch onto the skin every 3 (three) days.    Marland Kitchen nystatin (MYCOSTATIN) 100000 UNIT/ML suspension Take 5 mLs (500,000 Units total) by mouth 4 (four) times daily. Swish and spit. (Patient not taking: Reported on 01/14/2020) 300 mL 0  . prochlorperazine (COMPAZINE) 10 MG tablet Take 1 tablet (10 mg total) by mouth every 6 (six) hours as needed (Nausea or vomiting). (Patient not taking: Reported on 01/14/2020) 30 tablet 1  . sucralfate (CARAFATE) 1 g tablet Take 1 tablet (1 g total) by mouth 3 (three) times daily. Dissolve in 3-4 tbsp warm water, swish and swallow. (Patient not taking: Reported on 01/14/2020) 90 tablet 3  . vitamin B-12 (CYANOCOBALAMIN) 1000 MCG tablet Take 1 tablet (1,000 mcg total) by mouth daily. (Patient not taking: Reported on 01/15/2020) 30 tablet 1   No current facility-administered medications for this  visit.    Past Medical History:  Diagnosis Date  . Anxiety   . Arthritis   . Chronic kidney disease   . Family history of breast cancer    aunt  . GERD (gastroesophageal reflux disease)   . History of DVT of lower extremity    Left leg  . Lung cancer ([place])    with brain mets  . Lung mass   . Malignant hyperthermia   . 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 Muscular dystrophy ([place])   . Muscular dystrophy [facility])     Past Surgical History:  Procedure Laterality Date  . CESAREAN SECTION  11/1985  . COLONOSCOPY N/A 10/27/2015   Procedure: COLONOSCOPY;  Surgeon: Manya Silvas, MD;  Location: [facility] ENDOSCOPY;  Service: Endoscopy;  Laterality: N/A;   NO  Propofol - per office  . LITHOTRIPSY    . PORTA CATH INSERTION N/A 11/09/2019   Procedure: PORTA CATH INSERTION;  Surgeon: Algernon Huxley, MD;  Location: [facility];  Service: Cardiovascular;  Laterality: N/A;  . TUBAL LIGATION  03/1986     Social History   Tobacco Use  . Smoking status: Former Smoker    Packs/day: 1.00    Types: Cigarettes    Quit date: 05/2016    Years since quitting: 3.6  . Smokeless tobacco: Never Used  Substance Use Topics  . Alcohol use: No  . Drug use: No    Family History  Problem Relation Age of Onset  . Cancer Father        bladder, lung  . Breast cancer Maternal Aunt 45  No bleeding or clotting disorders  Allergies  Allergen Reactions  . Paxil [Paroxetine Hcl] Other (See Comments)    Hallucinations   . Ciprofloxacin Other (See Comments)    Unknown  . Levaquin [Levofloxacin] Other (See Comments)    Unknown   . 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 She has an increase risk of malignant hyperthermia with anesthesia. She should not receive Propofol.   Donne Hazel [Nefazodone] Other (See Comments)    Unknown   . Biaxin [Clarithromycin] Nausea Only  . Erythromycin Nausea Only  . Penicillins Rash     REVIEW OF SYSTEMS (Negative unless checked)  Constitutional: [] Weight loss  [] Fever  [] Chills Cardiac: [] Chest pain   [] Chest pressure   [] Palpitations   [] Shortness of breath when laying flat   [x] Shortness of breath at rest   [x] Shortness of breath with exertion. Vascular:  [] Pain in legs with walking   [] Pain in legs at rest   [] Pain in legs when laying flat   [] Claudication   [] Pain in feet when walking  [] Pain in feet at rest  [] Pain in feet when laying flat   [] History of DVT   [] Phlebitis   [] Swelling in legs   [] Varicose veins   [] Non-healing ulcers Pulmonary:   [] Uses home oxygen   [] Productive cough   [] Hemoptysis   [] Wheeze  [] COPD   [] Asthma Neurologic:  [] Dizziness   [] Blackouts   [] Seizures   [] History of stroke   [] History of TIA  [] Aphasia   [] Temporary blindness   [] Dysphagia   [] Weakness or numbness in arms   [x] Weakness or numbness in legs Musculoskeletal:  [x] Arthritis   [] Joint swelling   [] Joint pain   [] Low back pain Hematologic:  [] Easy bruising  [] Easy bleeding   []   Hypercoagulable state   [] Anemic   Gastrointestinal:  [] Blood in stool   [] Vomiting blood  [x] Gastroesophageal reflux/heartburn   [] Abdominal pain Genitourinary:  [] Chronic kidney disease   [] Difficult urination  [] Frequent urination  [] Burning with urination   [] Hematuria Skin:  [] Rashes   [] Ulcers   [] Wounds Psychological:  [x] History of anxiety   []  History of major depression.  Physical Examination  BP 121/71 (BP Location: Left Arm)   Pulse 86   Resp 12   Ht 5\' 1"  (1.549 m)   Wt 139 lb (63 kg)   LMP 12/15/2010 (Approximate)   BMI 26.26 kg/m  Gen:  WD/WN, NAD Head: [place]/AT, No temporalis wasting. Ear/Nose/Throat: Hearing grossly intact, nares w/o erythema or drainage Eyes: Conjunctiva clear. Sclera non-icteric Neck: Supple.  Trachea midline Pulmonary:  Good air movement, no use of accessory muscles.  Cardiac: RRR, no JVD Vascular: Port site as described above with a small opening where the port is visible. Vessel Right Left  Radial Palpable Palpable                       Musculoskeletal: M/S 5/5 throughout.  No deformity or atrophy.  Mild lower extremity edema. Neurologic: Sensation grossly intact in extremities.  Symmetrical.  Speech is fluent.  Psychiatric: Judgment intact, Mood & affect appropriate for pt's clinical situation. Dermatologic: No rashes or ulcers noted.  No cellulitis or open wounds.       Labs Recent Results (from the past 2160 hour(s))  Folate     Status: None   Collection Time: 10/19/19  9:28 AM  Result Value Ref Range   Folate 12.4 >5.9 ng/mL    Comment: Performed at [facility], [address] [place], [place] [address]    Vitamin B12     Status: None   Collection Time: 10/19/19  9:28 AM  Result Value Ref Range   Vitamin B-12 647 180 - 914 pg/mL    Comment: (NOTE) This assay is not validated for testing neonatal or myeloproliferative syndrome specimens for Vitamin B12 levels. Performed at [facility], [place] [address], [place], [place] [address]   Iron and TIBC     Status: Abnormal   Collection Time: 10/19/19  9:28 AM  Result Value Ref Range   Iron 37 28 - 170 ug/dL   TIBC 245 (L) 250 - 450 ug/dL   Saturation Ratios 15 10.4 - 31.8 %   UIBC 208 ug/dL    Comment: Performed at [facility], [address] [place], [place] [address]  Ferritin     Status: None   Collection Time: 10/19/19  9:28 AM  Result Value Ref Range   Ferritin 204 11 - 307 ng/mL    Comment: Performed at [facility], [address] [place], [place] [address]  Hold Tube- Blood Bank     Status: None   Collection Time: 10/22/19  8:51 AM  Result Value Ref Range   Blood Bank Specimen SAMPLE AVAILABLE FOR TESTING    Sample Expiration      10/25/2019,2359 Performed at [facility], [address] [place], [place] [address]   Comprehensive metabolic panel     Status: Abnormal   Collection Time: 10/22/19  9:02 AM  Result Value Ref Range   Sodium 135 135 - 145 mmol/L   Potassium 4.1 3.5 - 5.1 mmol/L   Chloride 101 98 - 111 mmol/L   CO2 25 22 - 32 mmol/L   Glucose, Bld 108 (  H) 70 - 99 mg/dL   BUN 21 (H) 6 - 20 mg/dL   Creatinine, Ser 0.34 (L) 0.44 - 1.00 mg/dL   Calcium 9.6 8.9 - 10.3 mg/dL   Total Protein 7.9 6.5 - 8.1 g/dL   Albumin 3.5 3.5 - 5.0 g/dL   AST 22 15 - 41 U/L   ALT 30 0 - 44 U/L   Alkaline Phosphatase 69 38 - 126 U/L   Total Bilirubin 0.4 0.3 - 1.2 mg/dL   GFR calc non Af Amer >60 >60 mL/min   GFR calc Af Amer >60 >60 mL/min   Anion gap 9 5 - 15    Comment: Performed at [facility], [address] [place], [place] [address]  CBC with Differential     Status:  Abnormal   Collection Time: 10/22/19  9:02 AM  Result Value Ref Range   WBC 15.4 (H) 4.0 - 10.5 K/uL   RBC 3.93 3.87 - 5.11 MIL/uL   Hemoglobin 11.4 (L) 12.0 - 15.0 g/dL   HCT 36.5 36.0 - 46.0 %   MCV 92.9 80.0 - 100.0 fL   MCH 29.0 26.0 - 34.0 pg   MCHC 31.2 30.0 - 36.0 g/dL   RDW 12.9 11.5 - 15.5 %   Platelets 659 (H) 150 - 400 K/uL   nRBC 0.0 0.0 - 0.2 %   Neutrophils Relative % 85 %   Neutro Abs 13.1 (H) 1.7 - 7.7 K/uL   Lymphocytes Relative 5 %   Lymphs Abs 0.8 0.7 - 4.0 K/uL   Monocytes Relative 9 %   Monocytes Absolute 1.4 (H) 0.1 - 1.0 K/uL   Eosinophils Relative 0 %   Eosinophils Absolute 0.0 0.0 - 0.5 K/uL   Basophils Relative 0 %   Basophils Absolute 0.0 0.0 - 0.1 K/uL   Immature Granulocytes 1 %   Abs Immature Granulocytes 0.11 (H) 0.00 - 0.07 K/uL    Comment: Performed at [facility], [place], [place] [address]  SARS CORONAVIRUS 2 (TAT 6-24 HRS) Nasopharyngeal Nasopharyngeal Swab     Status: None   Collection Time: 11/06/19 10:20 AM   Specimen: Nasopharyngeal Swab  Result Value Ref Range   SARS Coronavirus 2 NEGATIVE NEGATIVE    Comment: (NOTE) SARS-CoV-2 target nucleic acids are NOT DETECTED. The SARS-CoV-2 RNA is generally detectable in upper and lower respiratory specimens during the acute phase of infection. Negative results do not preclude SARS-CoV-2 infection, do not rule out co-infections with other pathogens, and should not be used as the sole basis for treatment or other patient management decisions. Negative results must be combined with clinical observations, patient history, and epidemiological information. The expected result is Negative. Fact Sheet for Patients: SugarRoll.be Fact Sheet for Healthcare Providers: https://www.woods-mathews.com/ This test is not yet approved or cleared by the [place] FDA and  has been authorized for detection and/or diagnosis of SARS-CoV-2  by FDA under an Emergency Use Authorization (EUA). This EUA will remain  in effect (meaning this test can be used) for the duration of the COVID-19 declaration under Section 56 4(b)(1) of the Act, 21 U.S.C. section 360bbb-3(b)(1), unless the authorization is terminated or revoked sooner. Performed at [facility], [place] [address], [place], [place] [address]   Protime-INR     Status: Abnormal   Collection Time: 11/09/19  3:11 PM  Result Value Ref Range   Prothrombin Time 16.4 (H) 11.4 - 15.2 seconds   INR 1.3 (H) 0.8 - 1.2  Comment: (NOTE) INR goal varies based on device and disease states. Performed at [facility], [address] [place], [place] [address]   TSH     Status: Abnormal   Collection Time: 11/12/19  8:18 AM  Result Value Ref Range   TSH 0.297 (L) 0.350 - 4.500 uIU/mL    Comment: Performed by a 3rd Generation assay with a functional sensitivity of <=0.01 uIU/mL. Performed at [facility], [address] [place], [place] [address]   T4     Status: None   Collection Time: 11/12/19  8:18 AM  Result Value Ref Range   T4, Total 6.4 4.5 - 12.0 ug/dL    Comment: (NOTE) Performed At: [facility] [place], [place] [id number] Rush Farmer MD [id number]   Comprehensive metabolic panel     Status: Abnormal   Collection Time: 11/12/19  8:18 AM  Result Value Ref Range   Sodium 134 (L) 135 - 145 mmol/L   Potassium 4.1 3.5 - 5.1 mmol/L   Chloride 98 98 - 111 mmol/L   CO2 29 22 - 32 mmol/L   Glucose, Bld 98 70 - 99 mg/dL   BUN 21 (H) 6 - 20 mg/dL   Creatinine, Ser <0.30 (L) 0.44 - 1.00 mg/dL   Calcium 8.7 (L) 8.9 - 10.3 mg/dL   Total Protein 6.8 6.5 - 8.1 g/dL   Albumin 3.2 (L) 3.5 - 5.0 g/dL   AST 17 15 - 41 U/L   ALT 34 0 - 44 U/L   Alkaline Phosphatase 67 38 - 126 U/L   Total Bilirubin 0.5 0.3 - 1.2 mg/dL   GFR calc non Af Amer NOT CALCULATED >60 mL/min   GFR calc Af Amer NOT CALCULATED >60 mL/min   Anion gap  7 5 - 15    Comment: Performed at [facility], [address] [place], [place] [address]  CBC with Differential     Status: Abnormal   Collection Time: 11/12/19  8:18 AM  Result Value Ref Range   WBC 10.7 (H) 4.0 - 10.5 K/uL   RBC 3.94 3.87 - 5.11 MIL/uL   Hemoglobin 11.5 (L) 12.0 - 15.0 g/dL   HCT 36.8 36.0 - 46.0 %   MCV 93.4 80.0 - 100.0 fL   MCH 29.2 26.0 - 34.0 pg   MCHC 31.3 30.0 - 36.0 g/dL   RDW 14.9 11.5 - 15.5 %   Platelets 265 150 - 400 K/uL   nRBC 0.0 0.0 - 0.2 %   Neutrophils Relative % 85 %   Neutro Abs 9.1 (H) 1.7 - 7.7 K/uL   Lymphocytes Relative 7 %   Lymphs Abs 0.8 0.7 - 4.0 K/uL   Monocytes Relative 7 %   Monocytes Absolute 0.7 0.1 - 1.0 K/uL   Eosinophils Relative 0 %   Eosinophils Absolute 0.0 0.0 - 0.5 K/uL   Basophils Relative 0 %   Basophils Absolute 0.0 0.0 - 0.1 K/uL   Immature Granulocytes 1 %   Abs Immature Granulocytes 0.11 (H) 0.00 - 0.07 K/uL    Comment: Performed at [facility], [place], [place] [address]  T4, free     Status: None   Collection Time: 11/12/19  8:32 AM  Result Value Ref Range   Free T4 0.96 0.61 - 1.12 ng/dL    Comment: (NOTE) Biotin ingestion may interfere with free T4 tests. If the results are inconsistent with the TSH level, previous test results, or the clinical  presentation, then consider biotin interference. If needed, order repeat testing after stopping biotin. Performed at [facility], [address] [place], [place] [address]   TSH     Status: None   Collection Time: 11/22/19  8:48 AM  Result Value Ref Range   TSH 0.875 0.350 - 4.500 uIU/mL    Comment: Performed by a 3rd Generation assay with a functional sensitivity of <=0.01 uIU/mL. Performed at [facility], [address] [place], [place] [address]   T4     Status: None   Collection Time: 11/22/19  8:48 AM  Result Value Ref Range   T4, Total 6.1 4.5 - 12.0 ug/dL    Comment: (NOTE) Performed At: [facility] [place], [place] [id number] Rush Farmer MD [id number]   Comprehensive metabolic panel     Status: Abnormal   Collection Time: 11/22/19  8:48 AM  Result Value Ref Range   Sodium 136 135 - 145 mmol/L   Potassium 3.9 3.5 - 5.1 mmol/L   Chloride 98 98 - 111 mmol/L   CO2 29 22 - 32 mmol/L   Glucose, Bld 101 (H) 70 - 99 mg/dL   BUN 11 6 - 20 mg/dL   Creatinine, Ser 0.35 (L) 0.44 - 1.00 mg/dL   Calcium 8.7 (L) 8.9 - 10.3 mg/dL   Total Protein 6.2 (L) 6.5 - 8.1 g/dL   Albumin 2.9 (L) 3.5 - 5.0 g/dL   AST 20 15 - 41 U/L   ALT 32 0 - 44 U/L   Alkaline Phosphatase 94 38 - 126 U/L   Total Bilirubin 0.3 0.3 - 1.2 mg/dL   GFR calc non Af Amer >60 >60 mL/min   GFR calc Af Amer >60 >60 mL/min   Anion gap 9 5 - 15    Comment: Performed at [facility], [address] [place], [place] [address]  CBC with Differential     Status: Abnormal   Collection Time: 11/22/19  8:48 AM  Result Value Ref Range   WBC 8.2 4.0 - 10.5 K/uL   RBC 3.45 (L) 3.87 - 5.11 MIL/uL   Hemoglobin 10.0 (L) 12.0 - 15.0 g/dL   HCT 32.6 (L) 36.0 - 46.0 %   MCV 94.5 80.0 - 100.0 fL   MCH 29.0 26.0 - 34.0 pg   MCHC 30.7 30.0 - 36.0 g/dL   RDW 15.5 11.5 - 15.5 %   Platelets 201 150 - 400 K/uL   nRBC 0.0 0.0 - 0.2 %   Neutrophils Relative % 76 %   Neutro Abs 6.2 1.7 - 7.7 K/uL   Lymphocytes Relative 9 %   Lymphs Abs 0.7 0.7 - 4.0 K/uL   Monocytes Relative 8 %   Monocytes Absolute 0.7 0.1 - 1.0 K/uL   Eosinophils Relative 0 %   Eosinophils Absolute 0.0 0.0 - 0.5 K/uL   Basophils Relative 0 %   Basophils Absolute 0.0 0.0 - 0.1 K/uL   WBC Morphology MILD LEFT SHIFT (1-5% METAS, OCC MYELO, OCC BANDS)     Comment: DIFF CONFIRMED BY MANUAL   RBC Morphology UNREMARKABLE    Smear Review Normal platelet morphology     Comment: PLATELETS APPEAR ADEQUATE   Immature Granulocytes 7 %    Comment: Increased IG's, likely caused by Bone Marrow Colony Stimulating Factor received within  30 days.   Abs Immature Granulocytes 0.57 (H) 0.00 - 0.07 K/uL    Comment: Performed at [facility], [place],  [place] [address]  CBC with Differential     Status: Abnormal   Collection Time: 11/28/19  1:15 PM  Result Value Ref Range   WBC 13.3 (H) 4.0 - 10.5 K/uL   RBC 3.45 (L) 3.87 - 5.11 MIL/uL   Hemoglobin 10.5 (L) 12.0 - 15.0 g/dL   HCT 32.7 (L) 36.0 - 46.0 %   MCV 94.8 80.0 - 100.0 fL   MCH 30.4 26.0 - 34.0 pg   MCHC 32.1 30.0 - 36.0 g/dL   RDW 16.3 (H) 11.5 - 15.5 %   Platelets 481 (H) 150 - 400 K/uL   nRBC 0.0 0.0 - 0.2 %   Neutrophils Relative % 72 %   Neutro Abs 9.6 (H) 1.7 - 7.7 K/uL   Lymphocytes Relative 11 %   Lymphs Abs 1.5 0.7 - 4.0 K/uL   Monocytes Relative 8 %   Monocytes Absolute 1.0 0.1 - 1.0 K/uL   Eosinophils Relative 0 %   Eosinophils Absolute 0.0 0.0 - 0.5 K/uL   Basophils Relative 1 %   Basophils Absolute 0.2 (H) 0.0 - 0.1 K/uL   WBC Morphology UDENYCA ADMIN ON 12.08.2020    Immature Granulocytes 8 %    Comment: Increased IG's, likely caused by Bone Marrow Colony Stimulating Factor received within 30 days.   Abs Immature Granulocytes 1.02 (H) 0.00 - 0.07 K/uL    Comment: Performed at [facility], [address] [place], [place] [address]  Comprehensive metabolic panel     Status: Abnormal   Collection Time: 11/28/19  1:15 PM  Result Value Ref Range   Sodium 136 135 - 145 mmol/L   Potassium 4.3 3.5 - 5.1 mmol/L   Chloride 97 (L) 98 - 111 mmol/L   CO2 30 22 - 32 mmol/L   Glucose, Bld 97 70 - 99 mg/dL   BUN 29 (H) 6 - 20 mg/dL   Creatinine, Ser 0.38 (L) 0.44 - 1.00 mg/dL   Calcium 8.9 8.9 - 10.3 mg/dL   Total Protein 6.7 6.5 - 8.1 g/dL   Albumin 3.2 (L) 3.5 - 5.0 g/dL   AST 18 15 - 41 U/L   ALT 31 0 - 44 U/L   Alkaline Phosphatase 77 38 - 126 U/L   Total Bilirubin 0.3 0.3 - 1.2 mg/dL   GFR calc non Af Amer >60 >60 mL/min   GFR calc Af Amer >60 >60 mL/min   Anion gap 9 5 - 15    Comment: Performed at [facility], [address] [place], [place] [address]  TSH     Status: None   Collection Time: 12/03/19  9:04 AM  Result Value Ref Range   TSH 0.763 0.350 - 4.500 uIU/mL    Comment: Performed by a 3rd Generation assay with a functional sensitivity of <=0.01 uIU/mL. Performed at [facility], [address] [place], [place] [address]   T4     Status: None   Collection Time: 12/03/19  9:04 AM  Result Value Ref Range   T4, Total 6.5 4.5 - 12.0 ug/dL    Comment: (NOTE) Performed At: [facility] [place], [place] [id number] Rush Farmer MD IH:[id number]   Comprehensive metabolic panel     Status: Abnormal   Collection Time: 12/03/19  9:04 AM  Result Value Ref Range   Sodium 138 135 - 145 mmol/L   Potassium 4.0 3.5 - 5.1 mmol/L   Chloride 100 98 - 111 mmol/L   CO2 28 22 -  32 mmol/L   Glucose, Bld 109 (H) 70 - 99 mg/dL   BUN 16 6 - 20 mg/dL   Creatinine, Ser 0.35 (L) 0.44 - 1.00 mg/dL   Calcium 9.0 8.9 - 10.3 mg/dL   Total Protein 6.9 6.5 - 8.1 g/dL   Albumin 3.1 (L) 3.5 - 5.0 g/dL   AST 19 15 - 41 U/L   ALT 27 0 - 44 U/L   Alkaline Phosphatase 71 38 - 126 U/L   Total Bilirubin 0.4 0.3 - 1.2 mg/dL   GFR calc non Af Amer >60 >60 mL/min   GFR calc Af Amer >60 >60 mL/min   Anion gap 10 5 - 15    Comment: Performed at [facility], [address] [place], [place] [address]  CBC with Differential     Status: Abnormal   Collection Time: 12/03/19  9:04 AM  Result Value Ref Range   WBC 12.2 (H) 4.0 - 10.5 K/uL   RBC 3.56 (L) 3.87 - 5.11 MIL/uL   Hemoglobin 10.3 (L) 12.0 - 15.0 g/dL   HCT 34.2 (L) 36.0 - 46.0 %   MCV 96.1 80.0 - 100.0 fL   MCH 28.9 26.0 - 34.0 pg   MCHC 30.1 30.0 - 36.0 g/dL   RDW 16.4 (H) 11.5 - 15.5 %   Platelets 623 (H) 150 - 400 K/uL   nRBC 0.0 0.0 - 0.2 %   Neutrophils Relative % 76 %   Neutro Abs 9.2 (H) 1.7 - 7.7 K/uL   Lymphocytes Relative 8 %   Lymphs Abs 1.0 0.7 - 4.0 K/uL   Monocytes Relative 10 %    Monocytes Absolute 1.3 (H) 0.1 - 1.0 K/uL   Eosinophils Relative 0 %   Eosinophils Absolute 0.1 0.0 - 0.5 K/uL   Basophils Relative 1 %   Basophils Absolute 0.2 (H) 0.0 - 0.1 K/uL   Immature Granulocytes 5 %   Abs Immature Granulocytes 0.58 (H) 0.00 - 0.07 K/uL    Comment: Performed at [facility], [address] [place], [place] [address]  Comprehensive metabolic panel     Status: Abnormal   Collection Time: 12/24/19  9:08 AM  Result Value Ref Range   Sodium 139 135 - 145 mmol/L   Potassium 3.8 3.5 - 5.1 mmol/L   Chloride 102 98 - 111 mmol/L   CO2 28 22 - 32 mmol/L   Glucose, Bld 107 (H) 70 - 99 mg/dL   BUN 11 6 - 20 mg/dL   Creatinine, Ser 0.38 (L) 0.44 - 1.00 mg/dL   Calcium 9.0 8.9 - 10.3 mg/dL   Total Protein 6.9 6.5 - 8.1 g/dL   Albumin 3.4 (L) 3.5 - 5.0 g/dL   AST 21 15 - 41 U/L   ALT 23 0 - 44 U/L   Alkaline Phosphatase 96 38 - 126 U/L   Total Bilirubin 0.5 0.3 - 1.2 mg/dL   GFR calc non Af Amer >60 >60 mL/min   GFR calc Af Amer >60 >60 mL/min   Anion gap 9 5 - 15    Comment: Performed at [facility], [address] [place], [place] [address]  CBC with Differential     Status: Abnormal   Collection Time: 12/24/19  9:08 AM  Result Value Ref Range   WBC 6.7 4.0 - 10.5 K/uL   RBC 3.42 (L) 3.87 - 5.11 MIL/uL   Hemoglobin 10.2 (L) 12.0 - 15.0 g/dL   HCT 33.1 (L) 36.0 - 46.0 %  MCV 96.8 80.0 - 100.0 fL   MCH 29.8 26.0 - 34.0 pg   MCHC 30.8 30.0 - 36.0 g/dL   RDW 17.2 (H) 11.5 - 15.5 %   Platelets 339 150 - 400 K/uL   nRBC 0.0 0.0 - 0.2 %   Neutrophils Relative % 80 %   Neutro Abs 5.3 1.7 - 7.7 K/uL   Lymphocytes Relative 8 %   Lymphs Abs 0.6 (L) 0.7 - 4.0 K/uL   Monocytes Relative 11 %   Monocytes Absolute 0.7 0.1 - 1.0 K/uL   Eosinophils Relative 0 %   Eosinophils Absolute 0.0 0.0 - 0.5 K/uL   Basophils Relative 1 %   Basophils Absolute 0.1 0.0 - 0.1 K/uL   Immature Granulocytes 0 %   Abs Immature Granulocytes 0.02 0.00 - 0.07 K/uL     Comment: Performed at [facility], [address] [place], [place] [address]  CK     Status: Abnormal   Collection Time: 12/27/19  1:36 PM  Result Value Ref Range   Total CK 8 (L) 38 - 234 U/L    Comment: Performed at [facility], [address] [place], [place] [address]  CBC with Differential     Status: Abnormal   Collection Time: 12/27/19  1:36 PM  Result Value Ref Range   WBC 44.1 (H) 4.0 - 10.5 K/uL   RBC 3.61 (L) 3.87 - 5.11 MIL/uL   Hemoglobin 10.9 (L) 12.0 - 15.0 g/dL   HCT 34.9 (L) 36.0 - 46.0 %   MCV 96.7 80.0 - 100.0 fL   MCH 30.2 26.0 - 34.0 pg   MCHC 31.2 30.0 - 36.0 g/dL   RDW 18.6 (H) 11.5 - 15.5 %   Platelets 331 150 - 400 K/uL   nRBC 0.0 0.0 - 0.2 %   Neutrophils Relative % 90 %   Neutro Abs 39.7 (H) 1.7 - 7.7 K/uL   Lymphocytes Relative 2 %   Lymphs Abs 0.6 (L) 0.7 - 4.0 K/uL   Monocytes Relative 0 %   Monocytes Absolute 0.2 0.1 - 1.0 K/uL   Eosinophils Relative 0 %   Eosinophils Absolute 0.0 0.0 - 0.5 K/uL   Basophils Relative 0 %   Basophils Absolute 0.0 0.0 - 0.1 K/uL   WBC Morphology MORPHOLOGY UNREMARKABLE    RBC Morphology MORPHOLOGY UNREMARKABLE    Smear Review Normal platelet morphology    Immature Granulocytes 8 %   Abs Immature Granulocytes 3.49 (H) 0.00 - 0.07 K/uL    Comment: Performed at [facility], [address] [place],  [address]  Comprehensive metabolic panel     Status: Abnormal   Collection Time: 12/27/19  1:36 PM  Result Value Ref Range   Sodium 136 135 - 145 mmol/L   Potassium 4.1 3.5 - 5.1 mmol/L   Chloride 100 98 - 111 mmol/L   CO2 27 22 - 32 mmol/L   Glucose, Bld 75 70 - 99 mg/dL   BUN 11 6 - 20 mg/dL   Creatinine, Ser 0.37 (L) 0.44 - 1.00 mg/dL   Calcium 8.7 (L) 8.9 - 10.3 mg/dL   Total Protein 6.8 6.5 - 8.1 g/dL   Albumin 3.4 (L) 3.5 - 5.0 g/dL   AST 17 15 - 41 U/L   ALT 17 0 - 44 U/L   Alkaline Phosphatase 111 38 - 126 U/L   Total Bilirubin 1.0 0.3 - 1.2 mg/dL   GFR calc non Af  Amer >60 >60 mL/min  GFR calc Af Amer >60 >60 mL/min   Anion gap 9 5 - 15    Comment: Performed at [facility], [address] [place], [place] [address]  Lipase, blood     Status: None   Collection Time: 12/27/19  1:36 PM  Result Value Ref Range   Lipase 21 11 - 51 U/L    Comment: Performed at [facility], [address] [place], [place] [address]  Novel Coronavirus, NAA (Hosp order, Send-out to Ref Lab; TAT 18-24 hrs     Status: None   Collection Time: 12/27/19  4:28 PM   Specimen: Nasopharyngeal Swab; Respiratory  Result Value Ref Range   SARS-CoV-2, NAA NOT DETECTED NOT DETECTED    Comment: (NOTE) This nucleic acid amplification test was developed and its performance characteristics determined by [employer]. Nucleic acid amplification tests include RT-PCR and TMA. This test has not been FDA cleared or approved. This test has been authorized by FDA under an Emergency Use Authorization (EUA). This test is only authorized for the duration of time the declaration that circumstances exist justifying the authorization of the emergency use of in vitro diagnostic tests for detection of SARS-CoV-2 virus and/or diagnosis of COVID-19 infection under section 564(b)(1) of the Act, 21 U.S.C. 527POE-4(M) (1), unless the authorization is terminated or revoked sooner. When diagnostic testing is negative, the possibility of a false negative result should be considered in the context of a patient's recent exposures and the presence of clinical signs and symptoms consistent with COVID-19. An individual without symptoms of COVID- 19 and who is not shedding SARS-CoV-2  virus would expect to have a negative (not detected) result in this assay. Performed At: [facility] [place], [place] [id number] Rush Farmer MD VQ:[id number]    Coronavirus Source NASOPHARYNGEAL     Comment: Performed at [facility], [address]  [place], [place] [address]  Comprehensive metabolic panel     Status: Abnormal   Collection Time: 01/14/20  8:03 AM  Result Value Ref Range   Sodium 138 135 - 145 mmol/L   Potassium 3.5 3.5 - 5.1 mmol/L   Chloride 104 98 - 111 mmol/L   CO2 25 22 - 32 mmol/L   Glucose, Bld 116 (H) 70 - 99 mg/dL   BUN 11 6 - 20 mg/dL   Creatinine, Ser 0.32 (L) 0.44 - 1.00 mg/dL   Calcium 8.9 8.9 - 10.3 mg/dL   Total Protein 6.7 6.5 - 8.1 g/dL   Albumin 3.3 (L) 3.5 - 5.0 g/dL   AST 34 15 - 41 U/L   ALT 30 0 - 44 U/L   Alkaline Phosphatase 103 38 - 126 U/L   Total Bilirubin 0.3 0.3 - 1.2 mg/dL   GFR calc non Af Amer >60 >60 mL/min   GFR calc Af Amer >60 >60 mL/min   Anion gap 9 5 - 15    Comment: Performed at [facility], [address] [place],  [address]  CBC with Differential     Status: Abnormal   Collection Time: 01/14/20  8:03 AM  Result Value Ref Range   WBC 5.0 4.0 - 10.5 K/uL   RBC 3.25 (L) 3.87 - 5.11 MIL/uL   Hemoglobin 10.3 (L) 12.0 - 15.0 g/dL   HCT 33.0 (L) 36.0 - 46.0 %   MCV 101.5 (H) 80.0 - 100.0 fL   MCH 31.7 26.0 - 34.0 pg   MCHC 31.2 30.0 - 36.0 g/dL   RDW  18.0 (H) 11.5 - 15.5 %   Platelets 314 150 - 400 K/uL   nRBC 0.0 0.0 - 0.2 %   Neutrophils Relative % 70 %   Neutro Abs 3.5 1.7 - 7.7 K/uL   Lymphocytes Relative 13 %   Lymphs Abs 0.7 0.7 - 4.0 K/uL   Monocytes Relative 16 %   Monocytes Absolute 0.8 0.1 - 1.0 K/uL   Eosinophils Relative 0 %   Eosinophils Absolute 0.0 0.0 - 0.5 K/uL   Basophils Relative 1 %   Basophils Absolute 0.0 0.0 - 0.1 K/uL   Immature Granulocytes 0 %   Abs Immature Granulocytes 0.01 0.00 - 0.07 K/uL    Comment: Performed at [facility], [address] [place], [place] [address]    Radiology CT Chest W Contrast  Result Date: 12/20/2019 CLINICAL DATA:  Lung cancer.  Restaging. EXAM: CT CHEST, ABDOMEN, AND PELVIS WITH CONTRAST TECHNIQUE: Multidetector CT imaging of the chest, abdomen and pelvis was performed following the  standard protocol during bolus administration of intravenous contrast. CONTRAST:  121mL OMNIPAQUE IOHEXOL 300 MG/ML  SOLN COMPARISON:  Abdomen/pelvis CT 10/03/2019. PET-CT 09/20/2019. Chest CT 06/16/2009. FINDINGS: CT CHEST FINDINGS Cardiovascular: The heart size is normal. No substantial pericardial effusion. Atherosclerotic calcification is noted in the wall of the thoracic aorta. Right Port-A-Cath tip is positioned in the upper right atrium. Mediastinum/Nodes: Stable 14 mm left thyroid nodule since 06/16/2009, compatible with benign etiology. No mediastinal lymphadenopathy. 10 mm short axis subcarinal node on 27/2 is upper normal for size. There is no hilar lymphadenopathy. The esophagus has normal imaging features. There is no axillary lymphadenopathy. Lungs/Pleura: Centrilobular emphsyema noted.Right lower lobe pulmonary mass is smaller today measuring 1.8 x 2.1 cm compared to chest CT 06/16/2009. No new pulmonary nodule or mass. No effusion. Musculoskeletal: No worrisome lytic or sclerotic osseous abnormality. CT ABDOMEN PELVIS FINDINGS Hepatobiliary: No suspicious focal abnormality within the liver parenchyma. A tiny hypodensity in the right liver is stable in the interval. There is no evidence for gallstones, gallbladder wall thickening, or pericholecystic fluid. No intrahepatic or extrahepatic biliary dilation. Pancreas: No focal mass lesion. No dilatation of the main duct. No intraparenchymal cyst. No peripancreatic edema. Spleen: No splenomegaly. No focal mass lesion. Adrenals/Urinary Tract: Right lower lobe pulmonary mass is smaller today measuring 1.8 x 2.1 cm compared to 3.6 x 3.1 cm on the previous PET-CT. The left adrenal lesion is 3.3 x 2.4 cm today which compares to 3.3 x 2.4 cm when I remeasure in a similar fashion on the previous study. Stable right renal cyst. Kidneys otherwise unremarkable. No evidence for hydroureter. The urinary bladder appears normal for the degree of distention.  Stomach/Bowel: Stomach is unremarkable. No gastric wall thickening. No evidence of outlet obstruction. Duodenum is normally positioned as is the ligament of Treitz. No small bowel wall thickening. No small bowel dilatation. The terminal ileum is normal. The appendix is normal. No gross colonic mass. No colonic wall thickening. Vascular/Lymphatic: There is abdominal aortic atherosclerosis without aneurysm. There is no gastrohepatic or hepatoduodenal ligament lymphadenopathy. No retroperitoneal or mesenteric lymphadenopathy. No pelvic sidewall lymphadenopathy. Reproductive: The uterus is unremarkable.  There is no adnexal mass. Other: No intraperitoneal free fluid. Musculoskeletal: No worrisome lytic or sclerotic osseous abnormality. Superior endplate compression deformity at T12 is stable. IMPRESSION: 1. Interval decrease in size of the right lower lobe pulmonary mass with of all vein adjacent radiation fibrosis. 2. Interval decrease in size of the right adrenal metastatic lesion with similar appearance of the  left adrenal metastasis. 3. No new or progressive findings in the chest, abdomen, or pelvis on today's study to suggest new sites of metastatic involvement. 4.  Aortic Atherosclerois (ICD10-170.0) Electronically Signed   By: Misty Stanley M.D.   On: 12/20/2019 14:45   CT Abdomen Pelvis W Contrast  Result Date: 12/20/2019 CLINICAL DATA:  Lung cancer.  Restaging. EXAM: CT CHEST, ABDOMEN, AND PELVIS WITH CONTRAST TECHNIQUE: Multidetector CT imaging of the chest, abdomen and pelvis was performed following the standard protocol during bolus administration of intravenous contrast. CONTRAST:  112mL OMNIPAQUE IOHEXOL 300 MG/ML  SOLN COMPARISON:  Abdomen/pelvis CT 10/03/2019. PET-CT 09/20/2019. Chest CT 06/16/2009. FINDINGS: CT CHEST FINDINGS Cardiovascular: The heart size is normal. No substantial pericardial effusion. Atherosclerotic calcification is noted in the wall of the thoracic aorta. Right Port-A-Cath tip  is positioned in the upper right atrium. Mediastinum/Nodes: Stable 14 mm left thyroid nodule since 06/16/2009, compatible with benign etiology. No mediastinal lymphadenopathy. 10 mm short axis subcarinal node on 27/2 is upper normal for size. There is no hilar lymphadenopathy. The esophagus has normal imaging features. There is no axillary lymphadenopathy. Lungs/Pleura: Centrilobular emphsyema noted.Right lower lobe pulmonary mass is smaller today measuring 1.8 x 2.1 cm compared to chest CT 06/16/2009. No new pulmonary nodule or mass. No effusion. Musculoskeletal: No worrisome lytic or sclerotic osseous abnormality. CT ABDOMEN PELVIS FINDINGS Hepatobiliary: No suspicious focal abnormality within the liver parenchyma. A tiny hypodensity in the right liver is stable in the interval. There is no evidence for gallstones, gallbladder wall thickening, or pericholecystic fluid. No intrahepatic or extrahepatic biliary dilation. Pancreas: No focal mass lesion. No dilatation of the main duct. No intraparenchymal cyst. No peripancreatic edema. Spleen: No splenomegaly. No focal mass lesion. Adrenals/Urinary Tract: Right lower lobe pulmonary mass is smaller today measuring 1.8 x 2.1 cm compared to 3.6 x 3.1 cm on the previous PET-CT. The left adrenal lesion is 3.3 x 2.4 cm today which compares to 3.3 x 2.4 cm when I remeasure in a similar fashion on the previous study. Stable right renal cyst. Kidneys otherwise unremarkable. No evidence for hydroureter. The urinary bladder appears normal for the degree of distention. Stomach/Bowel: Stomach is unremarkable. No gastric wall thickening. No evidence of outlet obstruction. Duodenum is normally positioned as is the ligament of Treitz. No small bowel wall thickening. No small bowel dilatation. The terminal ileum is normal. The appendix is normal. No gross colonic mass. No colonic wall thickening. Vascular/Lymphatic: There is abdominal aortic atherosclerosis without aneurysm. There is  no gastrohepatic or hepatoduodenal ligament lymphadenopathy. No retroperitoneal or mesenteric lymphadenopathy. No pelvic sidewall lymphadenopathy. Reproductive: The uterus is unremarkable.  There is no adnexal mass. Other: No intraperitoneal free fluid. Musculoskeletal: No worrisome lytic or sclerotic osseous abnormality. Superior endplate compression deformity at T12 is stable. IMPRESSION: 1. Interval decrease in size of the right lower lobe pulmonary mass with of all vein adjacent radiation fibrosis. 2. Interval decrease in size of the right adrenal metastatic lesion with similar appearance of the left adrenal metastasis. 3. No new or progressive findings in the chest, abdomen, or pelvis on today's study to suggest new sites of metastatic involvement. 4.  Aortic Atherosclerois (ICD10-170.0) Electronically Signed   By: Misty Stanley M.D.   On: 12/20/2019 14:45   DG Chest Portable 1 View  Result Date: 12/27/2019 CLINICAL DATA:  59 year old female with fever. Right lower lobe mass status post radiation. EXAM: PORTABLE CHEST 1 VIEW COMPARISON:  Chest radiograph dated 10/03/2019. CT of the chest abdomen pelvis dated  12/20/2019. FINDINGS: Right-sided Port-A-Cath with tip at the cavoatrial junction. Right lung base linear and platelike atelectasis/scarring or fibrosis related to post radiation changes of the known right lower lobe mass. The mass seen on the prior radiograph of 10/03/2019 is not as conspicuous on the current study. No new consolidative changes. There is no pleural effusion or pneumothorax. The cardiac silhouette is within normal limits. No acute osseous pathology. IMPRESSION: 1. No acute cardiopulmonary process. 2. Right lung base atelectasis/scarring or post radiation fibrosis. The previously seen right lung base mass is not as conspicuous on today's exam. Electronically Signed   By: Anner Crete M.D.   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  Reasonably poor prognosis.  Muscular dystrophy ([place]) Her sister says they are poor candidates for full anesthesia secondary to this.  Her sister had very difficult time waking from anesthesia.  Port-A-Cath in place The patient has a Port-A-Cath in place with a small wound which leaves the port exposed.  This is not a stable situation and the port will need to be revised.  It does not appear to be actively infected so I think we can salvage and keep a port in some fashion although I think the pocket may need to be changed.  She has very poor wound healing with her ongoing malignancy and multiple other medical issues suspect that is part of the wound healing process.  This will be scheduled in the near future at her convenience.    Leotis Pain, MD  01/15/2020 5:45 PM    This note was created with Dragon medical transcription system.  Any errors from dictation are purely unintentional

## 2020-01-15 NOTE — Assessment & Plan Note (Signed)
Her sister says they are poor candidates for full anesthesia secondary to this.  Her sister had very difficult time waking from anesthesia.

## 2020-01-15 NOTE — Progress Notes (Addendum)
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 She presented to Dr. Wilburn Cornelia symptoms of right flank pain which was radiating to her front which prompted a CT abdomen. CT abdomen pelvis with contrast showed a 3.7 x 2.8 x 5 cm right adrenal mass. 1.7 x 1.2 x 2.1 cm left adrenal nodule 1.8 cm right renal simple cyst. And a new 3.6 x 3.2 cm spiculated mass in the right lower lobe with tethering of the adjacent pleura. Prominent hilar lymph nodes. This was followed by a PET CT scan on 09/20/2019 which showed a 3.6 cm right lower lobe mass which was markedly hypermetabolic along with hypermetabolic metastatic disease in the right supraclavicular space as well as mediastinum and right hilum and hypermetabolic metastatic disease in bilateral adrenal glands. She will also noted to have bilateral hypermetabolic thyroid nodules and a possible nodule in the pelvis adjacent to the sigmoid colon.  Patient currently reports ongoing pain in her right flank which is relatively well controlled with hydrocodone. She also has occasional nausea for which she has as needed nausea medications. She is here with her twin sister today and is highly anxious Of note patient has a history of central core myopathy and follows up with neuromuscular clinic at [facility]. Patient states that her twin sister has had episodes of hyperthermia but patient herself has not had  these episodes. I did review [facility] neuromuscular clinic note and the hyperthermia has been attributed to her history of hyperthyroidism and thyroid dysfunction rather than malignant hyperthermia. However given her history of myopathy patient does carry risk of malignant hyperthermia.  Not enough tissue was available for NGS testing. Patient therefore underwentLiquid biopsy which showed evidence of PI K3 CA amplification, RIC POR amplification, T p53  Patient developed significant scalp and body rash after cycle 1 of carbotaxol Keytruda. This resolved after a trial of steroids and Keytruda was held for cycle 2. Interim scans after 2 cycles showed reduction in the size of the right lower lobe mass as well as some reduction in the adrenal metastases as well. No new progressive disease  Interval history-reports that her nausea is well controlled after starting Zyprexa.  Still has some low back pain but she is okay with the pain regimen that she is on currently.  She does have back pain especially when she stands up and she has to take frequent.'s of rest.  Reports that her bowels are moving well.  Does report feeling bloated and feels as if her entire body is swollen since she started chemotherapy.  Reports ongoing fatigue.  ECOG PS- 1 Pain scale- 4 Opioid associated constipation- no  Review of systems- Review of Systems  Constitutional: Positive for malaise/fatigue. Negative for chills, fever and weight loss.  HENT: Negative for congestion, ear discharge and nosebleeds.   Eyes: Negative for blurred vision.  Respiratory: Negative for cough, hemoptysis, sputum production, shortness of breath and wheezing.   Cardiovascular: Negative for chest pain, palpitations, orthopnea and claudication.  Gastrointestinal: Negative for abdominal pain, blood in stool, constipation, diarrhea, heartburn, melena, nausea and vomiting.  Genitourinary: Negative for dysuria, flank pain, frequency, hematuria and urgency.   Musculoskeletal: Positive for back pain. Negative for joint pain and myalgias.  Skin: Negative for rash.  Neurological: Negative for dizziness, tingling, focal weakness, seizures, weakness and headaches.  Endo/Heme/Allergies: Does not bruise/bleed easily.  Psychiatric/Behavioral: Negative for depression and suicidal ideas. The patient does not have insomnia.       Allergies  Allergen Reactions  . Paxil [Paroxetine Hcl] Other (See Comments)    Hallucinations   . Ciprofloxacin Other (See Comments)    Unknown  . Levaquin [Levofloxacin] Other (See Comments)    Unknown   . 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 She has an increase risk of malignant hyperthermia with anesthesia. She should not receive Propofol.   Donne Hazel [Nefazodone] Other (See Comments)    Unknown   . Biaxin [Clarithromycin] Nausea Only  . Erythromycin Nausea Only  . Penicillins Rash     Past Medical History:  Diagnosis Date  . Anxiety   . Arthritis   . Chronic kidney disease   . Family history of breast cancer    aunt  . GERD (gastroesophageal reflux disease)   . History of DVT of lower extremity    Left leg  . Lung cancer ([place])    with brain mets  . Lung mass   . Malignant hyperthermia   . 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 Muscular dystrophy ([place])   . Muscular dystrophy [facility])      Past Surgical History:  Procedure Laterality Date  . CESAREAN SECTION  11/1985  . COLONOSCOPY N/A 10/27/2015   Procedure: COLONOSCOPY;  Surgeon: Manya Silvas, MD;  Location: [facility] ENDOSCOPY;  Service: Endoscopy;  Laterality: N/A;   NO Propofol - per office  . LITHOTRIPSY    . PORTA CATH INSERTION N/A 11/09/2019   Procedure: PORTA CATH INSERTION;  Surgeon: Algernon Huxley, MD;  Location: [facility];  Service: Cardiovascular;  Laterality: N/A;  . TUBAL LIGATION  03/1986    Social History    Socioeconomic History  . Marital status: Divorced    Spouse name: Not on file  . Number of children: Not on file  . Years of education: Not on file  . Highest education level: Not on file  Occupational History  . Not on file  Tobacco Use  . Smoking status: Former Smoker    Packs/day: 1.00    Types: Cigarettes    Quit date: 05/2016    Years since quitting: 3.6  . Smokeless tobacco: Never Used  Substance and Sexual Activity  . Alcohol use: No  . Drug use: No  . Sexual activity: Not Currently  Other Topics Concern  . Not on file  Social History Narrative  . Not on file   Social Determinants of Health   Financial Resource Strain:   . Difficulty of Paying Living Expenses: Not on file  Food Insecurity:   . Worried About Charity fundraiser in the Last Year: Not on file  . Ran Out of Food in the Last Year: Not on file  Transportation Needs:   . Lack of Transportation (Medical): Not on file  . Lack of Transportation (Non-Medical): Not on file  Physical Activity:   . Days of Exercise per Week:  Not on file  . Minutes of Exercise per Session: Not on file  Stress:   . Feeling of Stress : Not on file  Social Connections:   . Frequency of Communication with Friends and Family: Not on file  . Frequency of Social Gatherings with Friends and Family: Not on file  . Attends Religious Services: Not on file  . Active Member of Clubs or Organizations: Not on file  . Attends Archivist Meetings: Not on file  . Marital Status: Not on file  Intimate Partner Violence:   . Fear of Current or Ex-Partner: Not on file  . Emotionally Abused: Not on file  . Physically Abused: Not on file  . Sexually Abused: Not on file    Family History  Problem Relation Age of Onset  . Cancer Father        bladder, lung  . Breast cancer Maternal Aunt 45     Current Outpatient Medications:  .  apixaban (ELIQUIS) 5 MG TABS tablet, Take 1 tablet (5 mg total) by mouth 2 (two) times daily.,  Disp: 60 tablet, Rfl: 2 .  citalopram (CELEXA) 40 MG tablet, Take 1 tablet (40 mg total) by mouth daily., Disp: 30 tablet, Rfl: 3 .  fentaNYL (DURAGESIC) 12 MCG/HR, Place 1 patch onto the skin every 3 (three) days., Disp: , Rfl:  .  lidocaine-prilocaine (EMLA) cream, Apply to affected area once, Disp: 30 g, Rfl: 3 .  magic mouthwash SOLN, Take 5 mLs by mouth 3 (three) times daily as needed for mouth pain., Disp: 300 mL, Rfl: 1 .  methimazole (TAPAZOLE) 5 MG tablet, Take 5 mg by mouth daily., Disp: , Rfl:  .  ondansetron (ZOFRAN) 8 MG tablet, Take 1 tablet (8 mg total) by mouth 2 (two) times daily as needed for refractory nausea / vomiting. Start on day 3 after carboplatin chemo., Disp: 30 tablet, Rfl: 1 .  Oxycodone HCl 10 MG TABS, Take 1 tablet (10 mg total) by mouth every 4 (four) hours as needed., Disp: 60 tablet, Rfl: 0 .  pantoprazole (PROTONIX) 20 MG tablet, TAKE 1 TABLET BY MOUTH 2 TIMES DAILY, Disp: 120 tablet, Rfl: 0 .  acetaminophen (TYLENOL) 325 MG tablet, Take 650 mg by mouth every 6 (six) hours as needed., Disp: , Rfl:  .  ALPRAZolam (XANAX) 0.25 MG tablet, Take 0.25 mg by mouth at bedtime as needed for anxiety., Disp: , Rfl:  .  dexamethasone (DECADRON) 4 MG tablet, Take 2 tablets (8 mg total) by mouth daily. Start the day after carboplatin chemotherapy for 3 days. (Patient not taking: Reported on 01/14/2020), Disp: 30 tablet, Rfl: 1 .  fluticasone (FLONASE) 50 MCG/ACT nasal spray, Place 2 sprays into both nostrils daily as needed. , Disp: , Rfl:  .  nystatin (MYCOSTATIN) 100000 UNIT/ML suspension, Take 5 mLs (500,000 Units total) by mouth 4 (four) times daily. Swish and spit. (Patient not taking: Reported on 01/14/2020), Disp: 300 mL, Rfl: 0 .  prochlorperazine (COMPAZINE) 10 MG tablet, Take 1 tablet (10 mg total) by mouth every 6 (six) hours as needed (Nausea or vomiting). (Patient not taking: Reported on 01/14/2020), Disp: 30 tablet, Rfl: 1 .  sucralfate (CARAFATE) 1 g tablet, Take 1 tablet  (1 g total) by mouth 3 (three) times daily. Dissolve in 3-4 tbsp warm water, swish and swallow. (Patient not taking: Reported on 01/14/2020), Disp: 90 tablet, Rfl: 3 .  vitamin B-12 (CYANOCOBALAMIN) 1000 MCG tablet, Take 1 tablet (1,000 mcg total) by mouth daily. (Patient  not taking: Reported on 01/14/2020), Disp: 30 tablet, Rfl: 1  Physical exam:  Vitals:   01/14/20 0849  BP: 117/67  Pulse: 80  Resp: 16  Temp: (!) 97.2 F (36.2 C)  TempSrc: Tympanic  Weight: 141 lb 12.8 oz (64.3 kg)   Physical Exam Constitutional:      Comments: Appears fatigued.  Sitting in a wheelchair  HENT:     Head: Normocephalic and atraumatic.  Eyes:     Pupils: Pupils are equal, round, and reactive to light.  Cardiovascular:     Rate and Rhythm: Normal rate and regular rhythm.     Heart sounds: Normal heart sounds.     Comments: Small pin-sized opening noted at the end of the suture line after port placement Pulmonary:     Effort: Pulmonary effort is normal.     Breath sounds: Normal breath sounds.  Abdominal:     General: Bowel sounds are normal.     Palpations: Abdomen is soft.  Musculoskeletal:     Cervical back: Normal range of motion.  Skin:    General: Skin is warm and dry.  Neurological:     Mental Status: She is alert and oriented to person, place, and time.      CMP Latest Ref Rng & Units 01/14/2020  Glucose 70 - 99 mg/dL 116(H)  BUN 6 - 20 mg/dL 11  Creatinine 0.44 - 1.00 mg/dL 0.32(L)  Sodium 135 - 145 mmol/L 138  Potassium 3.5 - 5.1 mmol/L 3.5  Chloride 98 - 111 mmol/L 104  CO2 22 - 32 mmol/L 25  Calcium 8.9 - 10.3 mg/dL 8.9  Total Protein 6.5 - 8.1 g/dL 6.7  Total Bilirubin 0.3 - 1.2 mg/dL 0.3  Alkaline Phos 38 - 126 U/L 103  AST 15 - 41 U/L 34  ALT 0 - 44 U/L 30   CBC Latest Ref Rng & Units 01/14/2020  WBC 4.0 - 10.5 K/uL 5.0  Hemoglobin 12.0 - 15.0 g/dL 10.3(L)  Hematocrit 36.0 - 46.0 % 33.0(L)  Platelets 150 - 400 K/uL 314    No images are attached to the encounter.  CT  Chest W Contrast  Result Date: 12/20/2019 CLINICAL DATA:  Lung cancer.  Restaging. EXAM: CT CHEST, ABDOMEN, AND PELVIS WITH CONTRAST TECHNIQUE: Multidetector CT imaging of the chest, abdomen and pelvis was performed following the standard protocol during bolus administration of intravenous contrast. CONTRAST:  172m OMNIPAQUE IOHEXOL 300 MG/ML  SOLN COMPARISON:  Abdomen/pelvis CT 10/03/2019. PET-CT 09/20/2019. Chest CT 06/16/2009. FINDINGS: CT CHEST FINDINGS Cardiovascular: The heart size is normal. No substantial pericardial effusion. Atherosclerotic calcification is noted in the wall of the thoracic aorta. Right Port-A-Cath tip is positioned in the upper right atrium. Mediastinum/Nodes: Stable 14 mm left thyroid nodule since 06/16/2009, compatible with benign etiology. No mediastinal lymphadenopathy. 10 mm short axis subcarinal node on 27/2 is upper normal for size. There is no hilar lymphadenopathy. The esophagus has normal imaging features. There is no axillary lymphadenopathy. Lungs/Pleura: Centrilobular emphsyema noted.Right lower lobe pulmonary mass is smaller today measuring 1.8 x 2.1 cm compared to chest CT 06/16/2009. No new pulmonary nodule or mass. No effusion. Musculoskeletal: No worrisome lytic or sclerotic osseous abnormality. CT ABDOMEN PELVIS FINDINGS Hepatobiliary: No suspicious focal abnormality within the liver parenchyma. A tiny hypodensity in the right liver is stable in the interval. There is no evidence for gallstones, gallbladder wall thickening, or pericholecystic fluid. No intrahepatic or extrahepatic biliary dilation. Pancreas: No focal mass lesion. No dilatation of the main duct.  No intraparenchymal cyst. No peripancreatic edema. Spleen: No splenomegaly. No focal mass lesion. Adrenals/Urinary Tract: Right lower lobe pulmonary mass is smaller today measuring 1.8 x 2.1 cm compared to 3.6 x 3.1 cm on the previous PET-CT. The left adrenal lesion is 3.3 x 2.4 cm today which compares to 3.3  x 2.4 cm when I remeasure in a similar fashion on the previous study. Stable right renal cyst. Kidneys otherwise unremarkable. No evidence for hydroureter. The urinary bladder appears normal for the degree of distention. Stomach/Bowel: Stomach is unremarkable. No gastric wall thickening. No evidence of outlet obstruction. Duodenum is normally positioned as is the ligament of Treitz. No small bowel wall thickening. No small bowel dilatation. The terminal ileum is normal. The appendix is normal. No gross colonic mass. No colonic wall thickening. Vascular/Lymphatic: There is abdominal aortic atherosclerosis without aneurysm. There is no gastrohepatic or hepatoduodenal ligament lymphadenopathy. No retroperitoneal or mesenteric lymphadenopathy. No pelvic sidewall lymphadenopathy. Reproductive: The uterus is unremarkable.  There is no adnexal mass. Other: No intraperitoneal free fluid. Musculoskeletal: No worrisome lytic or sclerotic osseous abnormality. Superior endplate compression deformity at T12 is stable. IMPRESSION: 1. Interval decrease in size of the right lower lobe pulmonary mass with of all vein adjacent radiation fibrosis. 2. Interval decrease in size of the right adrenal metastatic lesion with similar appearance of the left adrenal metastasis. 3. No new or progressive findings in the chest, abdomen, or pelvis on today's study to suggest new sites of metastatic involvement. 4.  Aortic Atherosclerois (ICD10-170.0) Electronically Signed   By: Misty Stanley M.D.   On: 12/20/2019 14:45   CT Abdomen Pelvis W Contrast  Result Date: 12/20/2019 CLINICAL DATA:  Lung cancer.  Restaging. EXAM: CT CHEST, ABDOMEN, AND PELVIS WITH CONTRAST TECHNIQUE: Multidetector CT imaging of the chest, abdomen and pelvis was performed following the standard protocol during bolus administration of intravenous contrast. CONTRAST:  130m OMNIPAQUE IOHEXOL 300 MG/ML  SOLN COMPARISON:  Abdomen/pelvis CT 10/03/2019. PET-CT 09/20/2019.  Chest CT 06/16/2009. FINDINGS: CT CHEST FINDINGS Cardiovascular: The heart size is normal. No substantial pericardial effusion. Atherosclerotic calcification is noted in the wall of the thoracic aorta. Right Port-A-Cath tip is positioned in the upper right atrium. Mediastinum/Nodes: Stable 14 mm left thyroid nodule since 06/16/2009, compatible with benign etiology. No mediastinal lymphadenopathy. 10 mm short axis subcarinal node on 27/2 is upper normal for size. There is no hilar lymphadenopathy. The esophagus has normal imaging features. There is no axillary lymphadenopathy. Lungs/Pleura: Centrilobular emphsyema noted.Right lower lobe pulmonary mass is smaller today measuring 1.8 x 2.1 cm compared to chest CT 06/16/2009. No new pulmonary nodule or mass. No effusion. Musculoskeletal: No worrisome lytic or sclerotic osseous abnormality. CT ABDOMEN PELVIS FINDINGS Hepatobiliary: No suspicious focal abnormality within the liver parenchyma. A tiny hypodensity in the right liver is stable in the interval. There is no evidence for gallstones, gallbladder wall thickening, or pericholecystic fluid. No intrahepatic or extrahepatic biliary dilation. Pancreas: No focal mass lesion. No dilatation of the main duct. No intraparenchymal cyst. No peripancreatic edema. Spleen: No splenomegaly. No focal mass lesion. Adrenals/Urinary Tract: Right lower lobe pulmonary mass is smaller today measuring 1.8 x 2.1 cm compared to 3.6 x 3.1 cm on the previous PET-CT. The left adrenal lesion is 3.3 x 2.4 cm today which compares to 3.3 x 2.4 cm when I remeasure in a similar fashion on the previous study. Stable right renal cyst. Kidneys otherwise unremarkable. No evidence for hydroureter. The urinary bladder appears normal for the degree of distention.  Stomach/Bowel: Stomach is unremarkable. No gastric wall thickening. No evidence of outlet obstruction. Duodenum is normally positioned as is the ligament of Treitz. No small bowel wall  thickening. No small bowel dilatation. The terminal ileum is normal. The appendix is normal. No gross colonic mass. No colonic wall thickening. Vascular/Lymphatic: There is abdominal aortic atherosclerosis without aneurysm. There is no gastrohepatic or hepatoduodenal ligament lymphadenopathy. No retroperitoneal or mesenteric lymphadenopathy. No pelvic sidewall lymphadenopathy. Reproductive: The uterus is unremarkable.  There is no adnexal mass. Other: No intraperitoneal free fluid. Musculoskeletal: No worrisome lytic or sclerotic osseous abnormality. Superior endplate compression deformity at T12 is stable. IMPRESSION: 1. Interval decrease in size of the right lower lobe pulmonary mass with of all vein adjacent radiation fibrosis. 2. Interval decrease in size of the right adrenal metastatic lesion with similar appearance of the left adrenal metastasis. 3. No new or progressive findings in the chest, abdomen, or pelvis on today's study to suggest new sites of metastatic involvement. 4.  Aortic Atherosclerois (ICD10-170.0) Electronically Signed   By: Misty Stanley M.D.   On: 12/20/2019 14:45   DG Chest Portable 1 View  Result Date: 12/27/2019 CLINICAL DATA:  59 year old female with fever. Right lower lobe mass status post radiation. EXAM: PORTABLE CHEST 1 VIEW COMPARISON:  Chest radiograph dated 10/03/2019. CT of the chest abdomen pelvis dated 12/20/2019. FINDINGS: Right-sided Port-A-Cath with tip at the cavoatrial junction. Right lung base linear and platelike atelectasis/scarring or fibrosis related to post radiation changes of the known right lower lobe mass. The mass seen on the prior radiograph of 10/03/2019 is not as conspicuous on the current study. No new consolidative changes. There is no pleural effusion or pneumothorax. The cardiac silhouette is within normal limits. No acute osseous pathology. IMPRESSION: 1. No acute cardiopulmonary process. 2. Right lung base atelectasis/scarring or post radiation  fibrosis. The previously seen right lung base mass is not as conspicuous on today's exam. Electronically Signed   By: Anner Crete M.D.   On: 12/27/2019 17:38     Assessment and plan- Patient is a 59 y.o. female withsquamous cell carcinoma of the lung stage IV [id number] with adrenal and brain metastases.She is here for on treatment assessment prior to cycle 4 of carbotaxol Keytruda chemotherapy  Counts okay to proceed with cycle 4 of carbotaxol Keytruda chemotherapy today.  She will come back on day 2 to receive [place]  She tolerated Keytruda well for cycle 3 without any side effects such as skin rash.  This will be her last cycle of induction chemotherapy and I plan to get repeat CT chest abdomen and pelvis after 4 cycles.  If she has stable disease or partial response we will continue Keytruda every 3 weeks until progression or toxicity.  Back pain: Etiology unclear.  She does not have any known bone mets.  She will continue fentanyl patch and as needed oxycodone at this time  Chemo-induced nausea: Continue as needed Compazine and Zofran as well as Zyprexa at nighttime.  Chemo-induced anemia: Stable continue to monitor  Patient has a pea-sized opening over her port area.  We will try to access it for chemotherapy and there was concerned that it was not functioning well.  Therefore port was not used for chemotherapy and she received treatment peripherally.  Patient has multiple allergies to medications and I will hold off on empiric antibiotics at this time and get her port evaluated by vascular surgery tomorrow  I will see her back in 3 weeks for cycle 1 of  maintenance Keytruda and she will get CT chest abdomen and pelvis with contrast prior   Visit Diagnosis 1. Malignant neoplasm of lower lobe of right lung ([place])   2. Encounter for antineoplastic chemotherapy   3. Encounter for antineoplastic immunotherapy   4. Antineoplastic chemotherapy induced anemia   5. Chemotherapy-induced  nausea      Dr. Randa Evens, MD, MPH [facility] [phone] 01/15/2020 12:06 PM

## 2020-01-15 NOTE — Assessment & Plan Note (Signed)
With brain mets.  Reasonably poor prognosis.

## 2020-01-16 ENCOUNTER — Telehealth (INDEPENDENT_AMBULATORY_CARE_PROVIDER_SITE_OTHER): Payer: Self-pay

## 2020-01-16 ENCOUNTER — Encounter (INDEPENDENT_AMBULATORY_CARE_PROVIDER_SITE_OTHER): Payer: Self-pay

## 2020-01-16 ENCOUNTER — Other Ambulatory Visit: Payer: Self-pay | Admitting: Oncology

## 2020-01-16 NOTE — Telephone Encounter (Signed)
Spoke with patient's sister [name] and the patient is now scheduled with Dr. Lucky Cowboy for a port revision on 01/21/20 with a 8:45 am arrival time to the [facility]. Patient will do covid testing on 01/17/20 between 12:30-2:30 pm at the [facility]. Pre-procedure instructions were discussed and will be mailed to the patient.

## 2020-01-18 ENCOUNTER — Other Ambulatory Visit
Admission: RE | Admit: 2020-01-18 | Discharge: 2020-01-18 | Disposition: A | Discharge status: Home / Self Care | Payer: Medicare Other | Source: Ambulatory Visit | Attending: Vascular Surgery | Admitting: Vascular Surgery

## 2020-01-18 ENCOUNTER — Other Ambulatory Visit: Payer: Self-pay

## 2020-01-18 DIAGNOSIS — Z20822 Contact with and (suspected) exposure to covid-19: Secondary | ICD-10-CM | POA: Diagnosis not present

## 2020-01-18 DIAGNOSIS — Z01812 Encounter for preprocedural laboratory examination: Secondary | ICD-10-CM | POA: Insufficient documentation

## 2020-01-19 LAB — SARS CORONAVIRUS 2 (TAT 6-24 HRS): SARS Coronavirus 2: NEGATIVE

## 2020-01-20 ENCOUNTER — Other Ambulatory Visit (INDEPENDENT_AMBULATORY_CARE_PROVIDER_SITE_OTHER): Payer: Self-pay | Admitting: Nurse Practitioner

## 2020-01-20 NOTE — ED Provider Notes (Signed)
ED ECG REPORT I, Delman Kitten, the attending physician, personally viewed and interpreted this ECG.  Date: 12/27/2019 EKG Time: 1342 Rate: 85 Rhythm: normal sinus rhythm QRS Axis: normal Intervals: normal ST/T Wave abnormalities: normal Narrative Interpretation: no evidence of acute ischemia    Delman Kitten, MD 01/20/20 [phone]

## 2020-01-21 ENCOUNTER — Other Ambulatory Visit: Payer: Self-pay | Admitting: *Deleted

## 2020-01-21 ENCOUNTER — Telehealth (INDEPENDENT_AMBULATORY_CARE_PROVIDER_SITE_OTHER): Payer: Self-pay

## 2020-01-21 MED ORDER — OXYCODONE HCL 10 MG PO TABS: 10.0000 mg | ORAL_TABLET | ORAL | 0 refills | Status: DC | PRN

## 2020-01-21 NOTE — Progress Notes (Signed)
Call received from Pt Sister Jearld Fenton regarding Pt status this AM. Per Sister Pt is not feeling well, running low grade temp and request to reschedule her procedure with Dr. Lucky Cowboy. Offered to speak with MD to update before rescheduling, Sister declined.  Office number Provided, Sister agrees to call office to reschedule. Message sent to Dr. Lucky Cowboy to update.

## 2020-01-21 NOTE — Telephone Encounter (Signed)
I received a call from the patient's sister stating she was running a fever. Patient is scheduled today for a port revision with Dr. Lucky Cowboy. Patient has been rescheduled for 01/23/20 with a 2:30 pm  arrival time to the [facility]. Patient has been on quarantine since her covid test on 01/18/20.

## 2020-01-21 NOTE — Telephone Encounter (Signed)
Patient's sister called back stating the patient has been off her Eliquis since 01/18/20 to prepare for her procedure on Monday and wants to know should she continue to stay off the Eliquis. Per Dr. Lucky Cowboy he does not want the patient on Eliquis during the procedure, Dr. Lucky Cowboy also wanted to let the patient know that if the port is infected it will be taken out and replaced in a week or two. I called the patient's sister back and asked why is the patient on Eliquis and she stated she has been on Eliquis for several years for a blood clot. I advised that she call the patient's doctor that placed her on Eliquis and ask the opinion of them regarding her Eliquis and being off for this amount of time. I also let them know about the port being infected and the steps included.

## 2020-01-22 ENCOUNTER — Inpatient Hospital Stay (HOSPITAL_BASED_OUTPATIENT_CLINIC_OR_DEPARTMENT_OTHER): Payer: Medicare Other | Admitting: Hospice and Palliative Medicine

## 2020-01-22 DIAGNOSIS — Z515 Encounter for palliative care: Secondary | ICD-10-CM

## 2020-01-22 MED ORDER — CLINDAMYCIN PHOSPHATE 300 MG/50ML IV SOLN: 300.0000 mg | Freq: Once | INTRAVENOUS | Status: DC

## 2020-01-22 NOTE — Progress Notes (Signed)
Was unable to reach patient at time of visit.  Voicemail left.  Will reschedule.

## 2020-01-23 ENCOUNTER — Encounter
Admission: RE | Disposition: A | Discharge status: Home / Self Care | Payer: Self-pay | Source: Home / Self Care | Attending: Vascular Surgery

## 2020-01-23 ENCOUNTER — Telehealth (INDEPENDENT_AMBULATORY_CARE_PROVIDER_SITE_OTHER): Payer: Self-pay

## 2020-01-23 ENCOUNTER — Ambulatory Visit
Admission: RE | Admit: 2020-01-23 | Discharge: 2020-01-23 | Disposition: A | Discharge status: Home / Self Care | Payer: Medicare Other | Attending: Vascular Surgery | Admitting: Vascular Surgery

## 2020-01-23 DIAGNOSIS — C3490 Malignant neoplasm of unspecified part of unspecified bronchus or lung: Secondary | ICD-10-CM | POA: Diagnosis present

## 2020-01-23 DIAGNOSIS — Z452 Encounter for adjustment and management of vascular access device: Secondary | ICD-10-CM | POA: Diagnosis present

## 2020-01-23 DIAGNOSIS — F419 Anxiety disorder, unspecified: Secondary | ICD-10-CM | POA: Diagnosis not present

## 2020-01-23 DIAGNOSIS — C7931 Secondary malignant neoplasm of brain: Secondary | ICD-10-CM | POA: Diagnosis not present

## 2020-01-23 DIAGNOSIS — Z9221 Personal history of antineoplastic chemotherapy: Secondary | ICD-10-CM | POA: Insufficient documentation

## 2020-01-23 DIAGNOSIS — Z7901 Long term (current) use of anticoagulants: Secondary | ICD-10-CM | POA: Insufficient documentation

## 2020-01-23 DIAGNOSIS — K219 Gastro-esophageal reflux disease without esophagitis: Secondary | ICD-10-CM | POA: Insufficient documentation

## 2020-01-23 DIAGNOSIS — Z79899 Other long term (current) drug therapy: Secondary | ICD-10-CM | POA: Diagnosis not present

## 2020-01-23 DIAGNOSIS — Z87891 Personal history of nicotine dependence: Secondary | ICD-10-CM | POA: Insufficient documentation

## 2020-01-23 DIAGNOSIS — G7100 Muscular dystrophy, unspecified: Secondary | ICD-10-CM | POA: Diagnosis not present

## 2020-01-23 DIAGNOSIS — Z86718 Personal history of other venous thrombosis and embolism: Secondary | ICD-10-CM | POA: Diagnosis not present

## 2020-01-23 DIAGNOSIS — N189 Chronic kidney disease, unspecified: Secondary | ICD-10-CM | POA: Insufficient documentation

## 2020-01-23 HISTORY — PX: PORTA CATH INSERTION: CATH118285

## 2020-01-23 SURGERY — PORTA CATH INSERTION
Anesthesia: Moderate Sedation

## 2020-01-23 MED ORDER — METHYLPREDNISOLONE SODIUM SUCC 125 MG IJ SOLR: 125.0000 mg | Freq: Once | INTRAMUSCULAR | Status: DC | PRN

## 2020-01-23 MED ORDER — FENTANYL CITRATE (PF) 100 MCG/2ML IJ SOLN
INTRAMUSCULAR | Status: AC
  Filled 2020-01-23: qty 2

## 2020-01-23 MED ORDER — FAMOTIDINE 20 MG PO TABS: 40.0000 mg | ORAL_TABLET | Freq: Once | ORAL | Status: DC | PRN

## 2020-01-23 MED ORDER — SODIUM CHLORIDE 0.9 % IV SOLN: INTRAVENOUS | Status: DC

## 2020-01-23 MED ORDER — FENTANYL CITRATE (PF) 100 MCG/2ML IJ SOLN
INTRAMUSCULAR | Status: DC | PRN
  Administered 2020-01-23: 50 ug via INTRAVENOUS

## 2020-01-23 MED ORDER — DIPHENHYDRAMINE HCL 50 MG/ML IJ SOLN: 50.0000 mg | Freq: Once | INTRAMUSCULAR | Status: DC | PRN

## 2020-01-23 MED ORDER — LIDOCAINE-EPINEPHRINE (PF) 1 %-1:200000 IJ SOLN
INTRAMUSCULAR | Status: AC
  Filled 2020-01-23: qty 20

## 2020-01-23 MED ORDER — MIDAZOLAM HCL 2 MG/2ML IJ SOLN
INTRAMUSCULAR | Status: AC
  Filled 2020-01-23: qty 2

## 2020-01-23 MED ORDER — ONDANSETRON HCL 4 MG/2ML IJ SOLN: 4.0000 mg | Freq: Four times a day (QID) | INTRAMUSCULAR | Status: DC | PRN

## 2020-01-23 MED ORDER — MIDAZOLAM HCL 2 MG/2ML IJ SOLN
INTRAMUSCULAR | Status: DC | PRN
  Administered 2020-01-23: 1 mg via INTRAVENOUS

## 2020-01-23 MED ORDER — HYDROMORPHONE HCL 1 MG/ML IJ SOLN: 1.0000 mg | Freq: Once | INTRAMUSCULAR | Status: DC | PRN

## 2020-01-23 MED ORDER — MIDAZOLAM HCL 2 MG/ML PO SYRP: 8.0000 mg | ORAL_SOLUTION | Freq: Once | ORAL | Status: DC | PRN

## 2020-01-23 MED ORDER — HEPARIN (PORCINE) IN NACL 1000-0.9 UT/500ML-% IV SOLN
INTRAVENOUS | Status: AC
  Filled 2020-01-23: qty 1000

## 2020-01-23 MED ORDER — AZITHROMYCIN 250 MG PO TABS: ORAL_TABLET | ORAL | 0 refills | Status: DC

## 2020-01-23 MED ORDER — HEPARIN (PORCINE) IN NACL 1000-0.9 UT/500ML-% IV SOLN
INTRAVENOUS | Status: DC | PRN
  Administered 2020-01-23: 500 mL

## 2020-01-23 MED ORDER — CLINDAMYCIN PHOSPHATE 300 MG/50ML IV SOLN
INTRAVENOUS | Status: AC
  Filled 2020-01-23: qty 50

## 2020-01-23 SURGICAL SUPPLY — 8 items
DERMABOND ADVANCED (GAUZE/BANDAGES/DRESSINGS) ×2
DERMABOND ADVANCED .7 DNX12 (GAUZE/BANDAGES/DRESSINGS) ×1 IMPLANT
ELECT REM PT RETURN 9FT ADLT (ELECTROSURGICAL) ×3
ELECTRODE REM PT RTRN 9FT ADLT (ELECTROSURGICAL) ×1 IMPLANT
PACK ANGIOGRAPHY (CUSTOM PROCEDURE TRAY) ×3 IMPLANT
PENCIL ELECTRO HAND CTR (MISCELLANEOUS) ×3 IMPLANT
SUT MNCRL AB 4-0 PS2 18 (SUTURE) ×6 IMPLANT
SUT PROLENE 0 CT 1 30 (SUTURE) ×3 IMPLANT

## 2020-01-23 NOTE — Op Note (Signed)
[facility]       Operative Note  Date: 01/23/2020  Preoperative diagnosis:  1. Lung cancer, wound near port concerning for infection  Postoperative diagnosis:  Same as above  Procedures: #1. Removal of right jugular port a cath   Surgeon: Leotis Pain, MD  Anesthesia: Local with moderate conscious sedation for 10 minutes using 2 mg of Versed and 50 mcg of Fentanyl  Fluoroscopy time: none  Contrast used: 0  Estimated blood loss: Minimal  Indication for the procedure:  The patient is a 59 y.o. female who has a small opening on the medial aspect of her port incision with mild drainage.  With exposure of the wound, this port needs to be removed, and after a window of time a new port can be placed. Risks and benefits including need for potential replacement with recurrent disease were discussed and patient is agreeable to proceed.  Description of procedure: The patient was brought to the vascular and interventional radiology suite. Moderate conscious sedation was administered during a face to face encounter with the patient throughout the procedure with my supervision of the RN administering medicines and monitoring the patient's vital signs, pulse oximetry, telemetry and mental status throughout from the start of the procedure until the patient was taken to the recovery room.  The right neck chest and shoulder were sterilely prepped and draped, and a sterile surgical field was created. The area was then anesthetized with 1% lidocaine copiously. The previous incision was reopened and electrocautery used to dissected down to the port and the catheter. These were dissected free and the catheter was gently removed from the vein in its entirety. The port was dissected out from the fibrous connective tissue and the Prolene sutures were removed. The port was then removed in its entirety including the catheter. The wound was then closed with  a 3-0 Vicryl and a 4-0 Monocryl and Dermabond was placed as a dressing. The patient was then taken to the recovery room in stable condition having tolerated the procedure well.  Complications: none  Condition: stable   Leotis Pain, MD 01/23/2020 4:38 PM   This note was created with Dragon Medical transcription system. Any errors in dictation are purely unintentional.

## 2020-01-23 NOTE — Discharge Instructions (Signed)
Moderate Conscious Sedation, Adult, Care After These instructions provide you with information about caring for yourself after your procedure. Your health care provider may also give you more specific instructions. Your treatment has been planned according to current medical practices, but problems sometimes occur. Call your health care provider if you have any problems or questions after your procedure. What can I expect after the procedure? After your procedure, it is common:  To feel sleepy for several hours.  To feel clumsy and have poor balance for several hours.  To have poor judgment for several hours.  To vomit if you eat too soon. Follow these instructions at home: For at least 24 hours after the procedure:   Do not: ? Participate in activities where you could fall or become injured. ? Drive. ? Use heavy machinery. ? Drink alcohol. ? Take sleeping pills or medicines that cause drowsiness. ? Make important decisions or sign legal documents. ? Take care of children on your own.  Rest. Eating and drinking  Follow the diet recommended by your health care provider.  If you vomit: ? Drink water, juice, or soup when you can drink without vomiting. ? Make sure you have little or no nausea before eating solid foods. General instructions  Have a responsible adult stay with you until you are awake and alert.  Take over-the-counter and prescription medicines only as told by your health care provider.  If you smoke, do not smoke without supervision.  Keep all follow-up visits as told by your health care provider. This is important. Contact a health care provider if:  You keep feeling nauseous or you keep vomiting.  You feel light-headed.  You develop a rash.  You have a fever. Get help right away if:  You have trouble breathing. This information is not intended to replace advice given to you by your health care provider. Make sure you discuss any questions you have  with your health care provider. Document Revised: 11/04/2017 Document Reviewed: 03/13/2016 Elsevier Patient Education  2020 [employer] Removal, Care After This sheet gives you information about how to care for yourself after your procedure. Your health care provider may also give you more specific instructions. If you have problems or questions, contact your health care provider. What can I expect after the procedure? After the procedure, it is common to have:  Soreness or pain near your incision.  Some swelling or bruising near your incision. Follow these instructions at home: Medicines  Take over-the-counter and prescription medicines only as told by your health care provider.  If you were prescribed an antibiotic medicine, take it as told by your health care provider. Do not stop taking the antibiotic even if you start to feel better. Bathing  Do not take baths, swim, or use a hot tub until your health care provider approves. Ask your health care provider if you can take showers. You may only be allowed to take sponge baths. Incision care   Follow instructions from your health care provider about how to take care of your incision. Make sure you: ? Wash your hands with soap and water before you change your bandage (dressing). If soap and water are not available, use hand sanitizer. ? Change your dressing as told by your health care provider. ? Keep your dressing dry. ? Leave stitches (sutures), skin glue, or adhesive strips in place. These skin closures may need to stay in place for 2 weeks or longer. If adhesive strip edges start to loosen  and curl up, you may trim the loose edges. Do not remove adhesive strips completely unless your health care provider tells you to do that.  Check your incision area every day for signs of infection. Check for: ? More redness, swelling, or pain. ? More fluid or blood. ? Warmth. ? Pus or a bad smell. Driving   Do not drive  for 24 hours if you were given a medicine to help you relax (sedative) during your procedure.  If you did not receive a sedative, ask your health care provider when it is safe to drive. Activity  Return to your normal activities as told by your health care provider. Ask your health care provider what activities are safe for you.  Do not lift anything that is heavier than 10 lb (4.5 kg), or the limit that you are told, until your health care provider says that it is safe.  Do not do activities that involve lifting your arms over your head. General instructions  Do not use any products that contain nicotine or tobacco, such as cigarettes and e-cigarettes. These can delay healing. If you need help quitting, ask your health care provider.  Keep all follow-up visits as told by your health care provider. This is important. Contact a health care provider if:  You have more redness, swelling, or pain around your incision.  You have more fluid or blood coming from your incision.  Your incision feels warm to the touch.  You have pus or a bad smell coming from your incision.  You have pain that is not relieved by your pain medicine. Get help right away if you have:  A fever or chills.  Chest pain.  Difficulty breathing. Summary  After the procedure, it is common to have pain, soreness, swelling, or bruising near your incision.  If you were prescribed an antibiotic medicine, take it as told by your health care provider. Do not stop taking the antibiotic even if you start to feel better.  Do not drive for 24 hours if you were given a sedative during your procedure.  Return to your normal activities as told by your health care provider. Ask your health care provider what activities are safe for you. This information is not intended to replace advice given to you by your health care provider. Make sure you discuss any questions you have with your health care provider. Document Revised:  01/05/2018 Document Reviewed: 01/05/2018 Elsevier Patient Education  2020 [employer].

## 2020-01-23 NOTE — Telephone Encounter (Signed)
Called pt and she said that she called to make the provider aware that her sister was running a fever but the  Hospital told her it was ok and to bring her sister to her appointment to have her port fixed no advice was given.

## 2020-01-23 NOTE — H&P (Signed)
[facility] History & Physical Update  The patient was interviewed and re-examined.  The patient's previous History and Physical has been reviewed and is unchanged.  There is no change in the plan of care. We plan to proceed with the scheduled procedure.  Leotis Pain, MD  01/23/2020, 4:20 PM

## 2020-01-24 ENCOUNTER — Telehealth (INDEPENDENT_AMBULATORY_CARE_PROVIDER_SITE_OTHER): Payer: Self-pay

## 2020-01-24 ENCOUNTER — Encounter: Payer: Self-pay | Admitting: Cardiology

## 2020-01-24 NOTE — Telephone Encounter (Signed)
I attempted to contact the patient's sister to give her information regarding the patient's port placement with Dr. Lucky Cowboy on 01/28/20 with a 11:00 am arrival time to the [facility]. Patient has had covid testing. A message was left for a return call.

## 2020-01-25 ENCOUNTER — Telehealth (INDEPENDENT_AMBULATORY_CARE_PROVIDER_SITE_OTHER): Payer: Self-pay

## 2020-01-25 NOTE — Telephone Encounter (Signed)
Patient's sister [name] left a message wanting to reschedule the patient's procedure with Dr. Lucky Cowboy on 01/28/20 for a port placement. I attempted to contact the patient's sister [name] and was unable to leave a message due to the voice mail box being full.

## 2020-01-27 ENCOUNTER — Other Ambulatory Visit (INDEPENDENT_AMBULATORY_CARE_PROVIDER_SITE_OTHER): Payer: Self-pay | Admitting: Nurse Practitioner

## 2020-01-28 ENCOUNTER — Other Ambulatory Visit: Payer: Self-pay

## 2020-01-28 ENCOUNTER — Ambulatory Visit: Admission: RE | Admit: 2020-01-28 | Payer: Medicare Other | Source: Home / Self Care | Admitting: Vascular Surgery

## 2020-01-28 ENCOUNTER — Ambulatory Visit
Admission: RE | Admit: 2020-01-28 | Discharge: 2020-01-28 | Disposition: A | Discharge status: Home / Self Care | Payer: Medicare Other | Source: Ambulatory Visit | Attending: Oncology | Admitting: Oncology

## 2020-01-28 ENCOUNTER — Encounter: Admission: RE | Payer: Self-pay | Source: Home / Self Care

## 2020-01-28 DIAGNOSIS — C3431 Malignant neoplasm of lower lobe, right bronchus or lung: Secondary | ICD-10-CM | POA: Diagnosis present

## 2020-01-28 SURGERY — PORTA CATH INSERTION
Anesthesia: Moderate Sedation

## 2020-01-28 MED ORDER — IOHEXOL 300 MG/ML  SOLN
100.0000 mL | Freq: Once | INTRAMUSCULAR | Status: AC | PRN
  Administered 2020-01-28: 100 mL via INTRAVENOUS

## 2020-01-29 ENCOUNTER — Inpatient Hospital Stay (HOSPITAL_BASED_OUTPATIENT_CLINIC_OR_DEPARTMENT_OTHER): Payer: Medicare Other | Admitting: Hospice and Palliative Medicine

## 2020-01-29 DIAGNOSIS — C3431 Malignant neoplasm of lower lobe, right bronchus or lung: Secondary | ICD-10-CM | POA: Diagnosis not present

## 2020-01-29 DIAGNOSIS — Z515 Encounter for palliative care: Secondary | ICD-10-CM | POA: Diagnosis not present

## 2020-01-29 NOTE — Progress Notes (Signed)
Virtual Visit via Telephone Note  I connected with Deborah Nguyen on 01/29/20 at  2:00 PM EST by telephone and verified that I am speaking with the correct person using two identifiers.   I discussed the limitations, risks, security and privacy concerns of performing an evaluation and management service by telephone and the availability of in person appointments. I also discussed with the patient that there may be a patient responsible charge related to this service. The patient expressed understanding and agreed to proceed.   History of Present Illness: Deborah Nguyen is a 59 y.o. 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  PMH is also notable for muscular dystrophy followed by neuromuscular clinic at [facility], anxiety, history of hypothyroidism, recurrent hyperthermia, and history of lower extremity DVT.  Patient was diagnosed with stage IV lung cancer after having right flank pain over several months.  She ultimately underwent PET/CT on 09/20/2019 with findings of hypermetabolic mass in the right lower lobe, supraclavicular/mediastinal/right hilar lymph nodes, and bilateral adrenal glands.  Patient was also found to have several brain masses on MRI.  Patient is s/p whole brain radiation.    She is on treatment with carboplatin/paclitaxel/pembrolizumab. Palliative care was consulted to help address goals and manage ongoing symptoms.   Observations/Objective: I called and spoke with patient by phone.  She reports overall that she is doing well.  She denies any significant changes or concerns.  No distressing symptoms were reported today.  Patient says that her pain and nausea are stable.  Oral intake is adequate.  No changes in performance status.  Assessment and Plan: Stage IV squamous cell carcinoma the lung -on current treatment with carboplatin/paclitaxel/pembrolizumab.  Follow-up appoint with Dr. Janese Banks next week  Neoplasm  related pain -continue oxycodone/fentanyl  Nausea -stable on Zyprexa/ondansetron  Weakness - Pending [facility] referral.   Follow Up Instructions: Follow-up telephone visit 2 weeks   I discussed the assessment and treatment plan with the patient. The patient was provided an opportunity to ask questions and all were answered. The patient agreed with the plan and demonstrated an understanding of the instructions.   The patient was advised to call back or seek an in-person evaluation if the symptoms worsen or if the condition fails to improve as anticipated.  I provided 5 minutes of non-face-to-face time during this encounter.   Irean Hong, NP

## 2020-01-30 ENCOUNTER — Other Ambulatory Visit (INDEPENDENT_AMBULATORY_CARE_PROVIDER_SITE_OTHER): Payer: Self-pay | Admitting: Nurse Practitioner

## 2020-01-30 ENCOUNTER — Telehealth (INDEPENDENT_AMBULATORY_CARE_PROVIDER_SITE_OTHER): Payer: Self-pay

## 2020-01-30 NOTE — Telephone Encounter (Signed)
Spoke with the patient's sister and she is now scheduled for a port placement with Dr. Lucky Cowboy on 02/01/20 with a 11:00 am arrival time to the [facility]. Patient will do covid testing on 01/31/20 before 11:00 am at the [facility]. Pre-procedure instructions were discussed.

## 2020-01-31 ENCOUNTER — Other Ambulatory Visit
Admission: RE | Admit: 2020-01-31 | Discharge: 2020-01-31 | Disposition: A | Discharge status: Home / Self Care | Payer: Medicare Other | Source: Ambulatory Visit | Attending: Vascular Surgery | Admitting: Vascular Surgery

## 2020-01-31 ENCOUNTER — Other Ambulatory Visit: Payer: Self-pay

## 2020-01-31 DIAGNOSIS — Z20822 Contact with and (suspected) exposure to covid-19: Secondary | ICD-10-CM | POA: Insufficient documentation

## 2020-01-31 DIAGNOSIS — Z01812 Encounter for preprocedural laboratory examination: Secondary | ICD-10-CM | POA: Diagnosis present

## 2020-01-31 LAB — SARS CORONAVIRUS 2 (TAT 6-24 HRS): SARS Coronavirus 2: NEGATIVE

## 2020-02-01 ENCOUNTER — Ambulatory Visit
Admission: RE | Admit: 2020-02-01 | Discharge: 2020-02-01 | Disposition: A | Discharge status: Home / Self Care | Payer: Medicare Other | Attending: Vascular Surgery | Admitting: Vascular Surgery

## 2020-02-01 ENCOUNTER — Encounter: Payer: Self-pay | Admitting: Cardiology

## 2020-02-01 ENCOUNTER — Encounter
Admission: RE | Disposition: A | Discharge status: Home / Self Care | Payer: Self-pay | Source: Home / Self Care | Attending: Vascular Surgery

## 2020-02-01 DIAGNOSIS — K219 Gastro-esophageal reflux disease without esophagitis: Secondary | ICD-10-CM | POA: Diagnosis not present

## 2020-02-01 DIAGNOSIS — Z8052 Family history of malignant neoplasm of bladder: Secondary | ICD-10-CM | POA: Insufficient documentation

## 2020-02-01 DIAGNOSIS — Z803 Family history of malignant neoplasm of breast: Secondary | ICD-10-CM | POA: Diagnosis not present

## 2020-02-01 DIAGNOSIS — Z7901 Long term (current) use of anticoagulants: Secondary | ICD-10-CM | POA: Diagnosis not present

## 2020-02-01 DIAGNOSIS — Z87891 Personal history of nicotine dependence: Secondary | ICD-10-CM | POA: Insufficient documentation

## 2020-02-01 DIAGNOSIS — G7100 Muscular dystrophy, unspecified: Secondary | ICD-10-CM | POA: Diagnosis not present

## 2020-02-01 DIAGNOSIS — C7931 Secondary malignant neoplasm of brain: Secondary | ICD-10-CM | POA: Insufficient documentation

## 2020-02-01 DIAGNOSIS — N189 Chronic kidney disease, unspecified: Secondary | ICD-10-CM | POA: Insufficient documentation

## 2020-02-01 DIAGNOSIS — M1990 Unspecified osteoarthritis, unspecified site: Secondary | ICD-10-CM | POA: Insufficient documentation

## 2020-02-01 DIAGNOSIS — C3490 Malignant neoplasm of unspecified part of unspecified bronchus or lung: Secondary | ICD-10-CM

## 2020-02-01 DIAGNOSIS — Z881 Allergy status to other antibiotic agents status: Secondary | ICD-10-CM | POA: Insufficient documentation

## 2020-02-01 DIAGNOSIS — Z888 Allergy status to other drugs, medicaments and biological substances status: Secondary | ICD-10-CM | POA: Insufficient documentation

## 2020-02-01 DIAGNOSIS — C3431 Malignant neoplasm of lower lobe, right bronchus or lung: Secondary | ICD-10-CM | POA: Insufficient documentation

## 2020-02-01 DIAGNOSIS — Z801 Family history of malignant neoplasm of trachea, bronchus and lung: Secondary | ICD-10-CM | POA: Insufficient documentation

## 2020-02-01 DIAGNOSIS — Z880 Allergy status to penicillin: Secondary | ICD-10-CM | POA: Diagnosis not present

## 2020-02-01 DIAGNOSIS — Z86718 Personal history of other venous thrombosis and embolism: Secondary | ICD-10-CM | POA: Diagnosis not present

## 2020-02-01 DIAGNOSIS — Z79899 Other long term (current) drug therapy: Secondary | ICD-10-CM | POA: Insufficient documentation

## 2020-02-01 HISTORY — PX: PORTA CATH INSERTION: CATH118285

## 2020-02-01 SURGERY — PORTA CATH INSERTION
Anesthesia: Moderate Sedation

## 2020-02-01 MED ORDER — FAMOTIDINE 20 MG PO TABS: 40.0000 mg | ORAL_TABLET | Freq: Once | ORAL | Status: DC | PRN

## 2020-02-01 MED ORDER — SODIUM CHLORIDE 0.9 % IV SOLN: INTRAVENOUS | Status: DC

## 2020-02-01 MED ORDER — METHYLPREDNISOLONE SODIUM SUCC 125 MG IJ SOLR: 125.0000 mg | Freq: Once | INTRAMUSCULAR | Status: DC | PRN

## 2020-02-01 MED ORDER — MIDAZOLAM HCL 2 MG/ML PO SYRP: 8.0000 mg | ORAL_SOLUTION | Freq: Once | ORAL | Status: DC | PRN

## 2020-02-01 MED ORDER — HYDROMORPHONE HCL 1 MG/ML IJ SOLN: 1.0000 mg | Freq: Once | INTRAMUSCULAR | Status: DC | PRN

## 2020-02-01 MED ORDER — MIDAZOLAM HCL 2 MG/2ML IJ SOLN
INTRAMUSCULAR | Status: DC | PRN
  Administered 2020-02-01: 1 mg via INTRAVENOUS
  Administered 2020-02-01: 2 mg via INTRAVENOUS

## 2020-02-01 MED ORDER — DIPHENHYDRAMINE HCL 50 MG/ML IJ SOLN: 50.0000 mg | Freq: Once | INTRAMUSCULAR | Status: DC | PRN

## 2020-02-01 MED ORDER — CLINDAMYCIN PHOSPHATE 300 MG/50ML IV SOLN
300.0000 mg | Freq: Once | INTRAVENOUS | Status: AC
  Administered 2020-02-01: 13:00:00 300 mg via INTRAVENOUS

## 2020-02-01 MED ORDER — ONDANSETRON HCL 4 MG/2ML IJ SOLN: 4.0000 mg | Freq: Four times a day (QID) | INTRAMUSCULAR | Status: DC | PRN

## 2020-02-01 MED ORDER — MIDAZOLAM HCL 5 MG/5ML IJ SOLN
INTRAMUSCULAR | Status: AC
  Filled 2020-02-01: qty 5

## 2020-02-01 MED ORDER — FENTANYL CITRATE (PF) 100 MCG/2ML IJ SOLN
INTRAMUSCULAR | Status: DC | PRN
  Administered 2020-02-01: 50 ug via INTRAVENOUS
  Administered 2020-02-01: 25 ug via INTRAVENOUS

## 2020-02-01 MED ORDER — FENTANYL CITRATE (PF) 100 MCG/2ML IJ SOLN
INTRAMUSCULAR | Status: AC
  Filled 2020-02-01: qty 2

## 2020-02-01 MED ORDER — CLINDAMYCIN PHOSPHATE 300 MG/50ML IV SOLN
INTRAVENOUS | Status: AC
  Filled 2020-02-01: qty 50

## 2020-02-01 MED ORDER — SODIUM CHLORIDE 0.9 % IV SOLN
Freq: Once | INTRAVENOUS | Status: DC
  Filled 2020-02-01: qty 2

## 2020-02-01 SURGICAL SUPPLY — 9 items
ELECT REM PT RETURN 9FT ADLT (ELECTROSURGICAL) ×3
ELECTRODE REM PT RTRN 9FT ADLT (ELECTROSURGICAL) ×1 IMPLANT
KIT PORT POWER 8FR ISP CVUE (Port) ×3 IMPLANT
PACK ANGIOGRAPHY (CUSTOM PROCEDURE TRAY) ×3 IMPLANT
PENCIL ELECTRO HAND CTR (MISCELLANEOUS) ×3 IMPLANT
SUT MNCRL AB 4-0 PS2 18 (SUTURE) ×3 IMPLANT
SUT PROLENE 0 CT 1 30 (SUTURE) ×3 IMPLANT
SUT VIC AB 3-0 SH 27 (SUTURE) ×2
SUT VIC AB 3-0 SH 27X BRD (SUTURE) ×1 IMPLANT

## 2020-02-01 NOTE — Progress Notes (Signed)
mouth has no sores in it-able to et and drink. Sore from portacath insertion. Getting around the house with rollator. Pain in back alittle but better. Takes zofran for nausea but takes it around the clock to make sure she does not get sick. Pt went to ER per advice of md on call due to fever 100.1 and neck was swollen from port insertion on Friday. Since the ER her fever has been 99.8 or lower. They did give her a atb z pack. She got it filled sun. But not taken yet since her fever was lower. They do have it if you want her to take it

## 2020-02-01 NOTE — H&P (Signed)
[facility] History & Physical Update  The patient was interviewed and re-examined.  The patient's previous History and Physical has been reviewed and is unchanged.  There is no change in the plan of care. We plan to proceed with the scheduled procedure.  Leotis Pain, MD  02/01/2020, 12:33 PM

## 2020-02-01 NOTE — Op Note (Signed)
      [facility]       Operative Note  Date: 02/01/2020  Preoperative diagnosis:  1. Lung cancer  Postoperative diagnosis:  Same as above  Procedures: #1. Ultrasound guidance for vascular access to the right internal jugular vein. #2. Fluoroscopic guidance for placement of catheter. #3. Placement of CT compatible Port-A-Cath, right internal jugular vein.  Surgeon: Leotis Pain, MD.   Anesthesia: Local with moderate conscious sedation for approximately 20  minutes using 3 mg of Versed and 75 mcg of Fentanyl  Fluoroscopy time: less than 1 minute  Contrast used: 0  Estimated blood loss: 5 cc  Indication for the procedure:  The patient is a 59 y.o.female with lung cancer. She had a port removed about two weeks ago for wound issues.  The patient needs a Port-A-Cath for durable venous access, chemotherapy, lab draws, and CT scans. We are asked to place this. Risks and benefits were discussed and informed consent was obtained.  Description of procedure: The patient was brought to the vascular and interventional radiology suite.  Moderate conscious sedation was administered throughout the procedure during a face to face encounter with the patient with my supervision of the RN administering medicines and monitoring the patient's vital signs, pulse oximetry, telemetry and mental status throughout from the start of the procedure until the patient was taken to the recovery room. The right neck chest and shoulder were sterilely prepped and draped, and a sterile surgical field was created. Ultrasound was used to help visualize a patent right internal jugular vein. This was then accessed under direct ultrasound guidance without difficulty with the Seldinger needle and a permanent image was recorded. A J-wire was placed. After skin nick and dilatation, the peel-away sheath was then placed over the wire. I then anesthetized an area under the clavicle approximately 1-2 fingerbreadths. A  transverse incision was created and an inferior pocket was created with electrocautery and blunt dissection. The port was then brought onto the field, placed into the pocket and secured to the chest wall with 2 Prolene sutures. The catheter was connected to the port and tunneled from the subclavicular incision to the access site. Fluoroscopic guidance was then used to cut the catheter to an appropriate length. The catheter was then placed through the peel-away sheath and the peel-away sheath was removed. The catheter tip was parked in excellent location under fluorocoscopic guidance in the cavoatrial junction. The pocket was then irrigated with antibiotic impregnated saline and the wound was closed with a running 3-0 Vicryl and a 4-0 Monocryl. The access incision was closed with a single 4-0 Monocryl. The Huber needle was used to withdraw blood and flush the port with heparinized saline. Dermabond was then placed as a dressing. The patient tolerated the procedure well and was taken to the recovery room in stable condition.   Leotis Pain 02/01/2020 1:45 PM   This note was created with Dragon Medical transcription system. Any errors in dictation are purely unintentional.

## 2020-02-01 NOTE — Discharge Instructions (Signed)
Implanted Port Insertion, Care After °This sheet gives you information about how to care for yourself after your procedure. Your health care provider may also give you more specific instructions. If you have problems or questions, contact your health care provider. °What can I expect after the procedure? °After the procedure, it is common to have: °· Discomfort at the port insertion site. °· Bruising on the skin over the port. This should improve over 3-4 days. °Follow these instructions at home: °Port care °· After your port is placed, you will get a manufacturer's information card. The card has information about your port. Keep this card with you at all times. °· Take care of the port as told by your health care provider. Ask your health care provider if you or a family member can get training for taking care of the port at home. A home health care nurse may also take care of the port. °· Make sure to remember what type of port you have. °Incision care ° °  ° °· Follow instructions from your health care provider about how to take care of your port insertion site. Make sure you: °? Wash your hands with soap and water before and after you change your bandage (dressing). If soap and water are not available, use hand sanitizer. °? Change your dressing as told by your health care provider. °? Leave stitches (sutures), skin glue, or adhesive strips in place. These skin closures may need to stay in place for 2 weeks or longer. If adhesive strip edges start to loosen and curl up, you may trim the loose edges. Do not remove adhesive strips completely unless your health care provider tells you to do that. °· Check your port insertion site every day for signs of infection. Check for: °? Redness, swelling, or pain. °? Fluid or blood. °? Warmth. °? Pus or a bad smell. °Activity °· Return to your normal activities as told by your health care provider. Ask your health care provider what activities are safe for you. °· Do not  lift anything that is heavier than 10 lb (4.5 kg), or the limit that you are told, until your health care provider says that it is safe. °General instructions °· Take over-the-counter and prescription medicines only as told by your health care provider. °· Do not take baths, swim, or use a hot tub until your health care provider approves. Ask your health care provider if you may take showers. You may only be allowed to take sponge baths. °· Do not drive for 24 hours if you were given a sedative during your procedure. °· Wear a medical alert bracelet in case of an emergency. This will tell any health care providers that you have a port. °· Keep all follow-up visits as told by your health care provider. This is important. °Contact a health care provider if: °· You cannot flush your port with saline as directed, or you cannot draw blood from the port. °· You have a fever or chills. °· You have redness, swelling, or pain around your port insertion site. °· You have fluid or blood coming from your port insertion site. °· Your port insertion site feels warm to the touch. °· You have pus or a bad smell coming from the port insertion site. °Get help right away if: °· You have chest pain or shortness of breath. °· You have bleeding from your port that you cannot control. °Summary °· Take care of the port as told by your health   care provider. Keep the manufacturer's information card with you at all times. °· Change your dressing as told by your health care provider. °· Contact a health care provider if you have a fever or chills or if you have redness, swelling, or pain around your port insertion site. °· Keep all follow-up visits as told by your health care provider. °This information is not intended to replace advice given to you by your health care provider. Make sure you discuss any questions you have with your health care provider. °Document Revised: 06/20/2018 Document Reviewed: 06/20/2018 °Elsevier Patient Education ©  2020 [employer] ° °

## 2020-02-02 ENCOUNTER — Encounter: Payer: Self-pay | Admitting: Emergency Medicine

## 2020-02-02 ENCOUNTER — Other Ambulatory Visit: Payer: Self-pay

## 2020-02-02 DIAGNOSIS — Z85841 Personal history of malignant neoplasm of brain: Secondary | ICD-10-CM | POA: Diagnosis not present

## 2020-02-02 DIAGNOSIS — T8141XA Infection following a procedure, superficial incisional surgical site, initial encounter: Secondary | ICD-10-CM | POA: Insufficient documentation

## 2020-02-02 DIAGNOSIS — Z79899 Other long term (current) drug therapy: Secondary | ICD-10-CM | POA: Insufficient documentation

## 2020-02-02 DIAGNOSIS — Z85118 Personal history of other malignant neoplasm of bronchus and lung: Secondary | ICD-10-CM | POA: Diagnosis not present

## 2020-02-02 DIAGNOSIS — Y711 Therapeutic (nonsurgical) and rehabilitative cardiovascular devices associated with adverse incidents: Secondary | ICD-10-CM | POA: Diagnosis not present

## 2020-02-02 DIAGNOSIS — N189 Chronic kidney disease, unspecified: Secondary | ICD-10-CM | POA: Diagnosis not present

## 2020-02-02 DIAGNOSIS — Z87891 Personal history of nicotine dependence: Secondary | ICD-10-CM | POA: Insufficient documentation

## 2020-02-02 DIAGNOSIS — Z7901 Long term (current) use of anticoagulants: Secondary | ICD-10-CM | POA: Diagnosis not present

## 2020-02-02 DIAGNOSIS — L7682 Other postprocedural complications of skin and subcutaneous tissue: Secondary | ICD-10-CM | POA: Diagnosis present

## 2020-02-02 LAB — CBC WITH DIFFERENTIAL/PLATELET
Abs Immature Granulocytes: 0.01 10*3/uL (ref 0.00–0.07)
Basophils Absolute: 0.1 10*3/uL (ref 0.0–0.1)
Basophils Relative: 1 %
Eosinophils Absolute: 0 10*3/uL (ref 0.0–0.5)
Eosinophils Relative: 0 %
HCT: 35.5 % — ABNORMAL LOW (ref 36.0–46.0)
Hemoglobin: 11.4 g/dL — ABNORMAL LOW (ref 12.0–15.0)
Immature Granulocytes: 0 %
Lymphocytes Relative: 16 %
Lymphs Abs: 0.8 10*3/uL (ref 0.7–4.0)
MCH: 32.4 pg (ref 26.0–34.0)
MCHC: 32.1 g/dL (ref 30.0–36.0)
MCV: 100.9 fL — ABNORMAL HIGH (ref 80.0–100.0)
Monocytes Absolute: 0.7 10*3/uL (ref 0.1–1.0)
Monocytes Relative: 15 %
Neutro Abs: 3.3 10*3/uL (ref 1.7–7.7)
Neutrophils Relative %: 68 %
Platelets: 230 10*3/uL (ref 150–400)
RBC: 3.52 MIL/uL — ABNORMAL LOW (ref 3.87–5.11)
RDW: 17 % — ABNORMAL HIGH (ref 11.5–15.5)
WBC: 4.8 10*3/uL (ref 4.0–10.5)
nRBC: 0 % (ref 0.0–0.2)

## 2020-02-02 LAB — LACTIC ACID, PLASMA: Lactic Acid, Venous: 1.7 mmol/L (ref 0.5–1.9)

## 2020-02-02 LAB — BASIC METABOLIC PANEL
Anion gap: 11 (ref 5–15)
BUN: 13 mg/dL (ref 6–20)
CO2: 23 mmol/L (ref 22–32)
Calcium: 9.3 mg/dL (ref 8.9–10.3)
Chloride: 104 mmol/L (ref 98–111)
Creatinine, Ser: 0.37 mg/dL — ABNORMAL LOW (ref 0.44–1.00)
GFR calc Af Amer: >60 mL/min (ref >60)
GFR calc non Af Amer: >60 mL/min (ref >60)
Glucose, Bld: 115 mg/dL — ABNORMAL HIGH (ref 70–99)
Potassium: 4.3 mmol/L (ref 3.5–5.1)
Sodium: 138 mmol/L (ref 135–145)

## 2020-02-02 NOTE — ED Triage Notes (Signed)
Pt arrives POV to triage with c/o post op infection from her port site. Pt had port placed on Friday. Area is red and swollen at this time. Pt has low grade temperature at this time but is in NAD.

## 2020-02-02 NOTE — ED Notes (Signed)
Patient's sister called for an update. Patient's sister given update on patient's status.

## 2020-02-03 ENCOUNTER — Emergency Department
Admission: EM | Admit: 2020-02-03 | Discharge: 2020-02-03 | Disposition: A | Discharge status: Home / Self Care | Payer: Medicare Other | Attending: Emergency Medicine | Admitting: Emergency Medicine

## 2020-02-03 DIAGNOSIS — T8141XA Infection following a procedure, superficial incisional surgical site, initial encounter: Secondary | ICD-10-CM | POA: Diagnosis not present

## 2020-02-03 MED ORDER — AZITHROMYCIN 500 MG PO TABS
500.0000 mg | ORAL_TABLET | Freq: Once | ORAL | Status: AC
  Administered 2020-02-03: 04:00:00 500 mg via ORAL
  Filled 2020-02-03: qty 1

## 2020-02-03 MED ORDER — AZITHROMYCIN 250 MG PO TABS: 250.0000 mg | ORAL_TABLET | Freq: Every day | ORAL | 0 refills | Status: DC

## 2020-02-03 NOTE — Discharge Instructions (Signed)
1.  Finish antibiotic as prescribed: Azithromycin 250 mg daily x4 days.  Start your next dose Monday morning. 2.  Return to the ER for worsening symptoms, persistent vomiting, difficulty breathing or other concerns.

## 2020-02-03 NOTE — ED Provider Notes (Signed)
[facility] Emergency Department Provider Note   ____________________________________________   First MD Initiated Contact with Patient 02/03/20 0250     (approximate)  I have reviewed the triage vital signs and the nursing notes.   HISTORY  Chief Complaint Post-op Problem    HPI Deborah Nguyen is a 59 y.o. female who presents to the ED from home with concerns for postoperative infection.  Patient has a history of lung cancer and had a Port-A-Cath placed in her right chest on 2/26.  Previously her Port-A-Cath in a similar location was removed on 2/17 for infection.  At that time she had high fevers and purulence and infection was treated with Z-Pak.  She was concerned today because the area looked red and was warm to the touch.  Reports temperature 100.1 degrees at home.  No antipyretic taken prior to arrival.  Denies chest pain, shortness of breath, abdominal pain, nausea, vomiting or dizziness.       Past Medical History:  Diagnosis Date  . Anxiety   . Arthritis   . Chronic kidney disease   . Family history of breast cancer    aunt  . GERD (gastroesophageal reflux disease)   . History of DVT of lower extremity    Left leg  . Lung cancer ([place])    with brain mets  . Lung mass   . Malignant hyperthermia   . 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 Muscular dystrophy ([place])   . Muscular dystrophy [facility])     Patient Active Problem List   Diagnosis Date Noted  . Port-A-Cath in place 01/15/2020  . Muscular dystrophy ([place]) 10/24/2019  . Thyroid nodule 10/24/2019  . Goals of care, counseling/discussion 09/20/2019  . 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 Malignant neoplasm of lower lobe of right lung ([place]) 09/20/2019  . GAD (generalized anxiety disorder) 09/01/2018  . History of kidney stones 09/01/2018  . Subclinical hyperthyroidism 01/24/2017  . Central core myopathy  09/17/2016  . Low serum vitamin D 09/17/2016  . Hx of adenomatous colonic polyps 08/01/2015  . History of DVT of lower extremity 05/23/2015  . Anxiety 05/31/2014  . GERD (gastroesophageal reflux disease) 05/31/2014  . Fever of unknown origin 05/06/2013  . 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 CESAREAN SECTION  11/1985  . COLONOSCOPY N/A 10/27/2015   Procedure: COLONOSCOPY;  Surgeon: Manya Silvas, MD;  Location[facility] ENDOSCOPY;  Service: Endoscopy;  Laterality: N/A;   NO Propofol - per office  . LITHOTRIPSY    . PORTA CATH INSERTION N/A 11/09/2019   Procedure: PORTA CATH INSERTION;  Surgeon: Algernon Huxley, MD;  Location: [place] CV LAB;  Service: Cardiovascular;  Laterality: N/A;  . PORTA CATH INSERTION N/A 01/23/2020   Procedure: PORTA CATH INSERTION;  Surgeon: Algernon Huxley, MD;  Location: [place] CV LAB;  Service: Cardiovascular;  Laterality: N/A;  . PORTA CATH INSERTION N/A 02/01/2020   Procedure: PORTA CATH INSERTION;  Surgeon: Algernon Huxley, MD;  Location: [place] CV LAB;  Service: Cardiovascular;  Laterality: N/A;  . TUBAL LIGATION  03/1986    Prior to Admission medications   Medication Sig Start Date End Date Taking? Authorizing Provider  acetaminophen (TYLENOL) 325 MG tablet Take 650 mg by mouth every 6 (six) hours as needed.    [provider]  ALPRAZolam Duanne Moron) 0.25  MG tablet Take 0.25 mg by mouth at bedtime as needed for anxiety.    [provider]  apixaban (ELIQUIS) 5 MG TABS tablet Take 1 tablet (5 mg total) by mouth 2 (two) times daily. 12/05/19   Sindy Guadeloupe, MD  azithromycin (ZITHROMAX) 250 MG tablet Take 1 tablet (250 mg total) by mouth daily. 02/03/20   Paulette Blanch, MD  citalopram (CELEXA) 40 MG tablet Take 1 tablet (40 mg total) by mouth daily. 12/11/19   Borders, Kirt Boys, NP  fentaNYL (DURAGESIC) 12 MCG/HR PLACE 1 PATCH ONTO THE SKIN EVERY 3 DAYS 01/16/20   Sindy Guadeloupe, MD  fluticasone [facility]) 50 MCG/ACT nasal spray Place 2 sprays into both nostrils daily as needed.     [provider]  lidocaine-prilocaine (EMLA) cream Apply to affected area once 11/06/19   Sindy Guadeloupe, MD  magic mouthwash SOLN Take 5 mLs by mouth 3 (three) times daily as needed for mouth pain. 10/19/19   Sindy Guadeloupe, MD  methimazole (TAPAZOLE) 5 MG tablet Take 5 mg by mouth daily.    [provider]  nystatin (MYCOSTATIN) 100000 UNIT/ML suspension Take 5 mLs (500,000 Units total) by mouth 4 (four) times daily. Swish and spit. 10/08/19   Sindy Guadeloupe, MD  ondansetron (ZOFRAN) 8 MG tablet Take 1 tablet (8 mg total) by mouth 2 (two) times daily as needed for refractory nausea / vomiting. Start on day 3 after carboplatin chemo. 12/17/19   Sindy Guadeloupe, MD  Oxycodone HCl 10 MG TABS Take 1 tablet (10 mg total) by mouth every 4 (four) hours as needed. 01/21/20   Borders, Kirt Boys, NP  pantoprazole (PROTONIX) 20 MG tablet TAKE 1 TABLET BY MOUTH 2 TIMES DAILY 12/18/19   Sindy Guadeloupe, MD  prochlorperazine (COMPAZINE) 10 MG tablet Take 1 tablet (10 mg total) by mouth every 6 (six) hours as needed (Nausea or vomiting). 11/06/19   Sindy Guadeloupe, MD  sucralfate (CARAFATE) 1 g tablet Take 1 tablet (1 g total) by mouth 3 (three) times daily. Dissolve in 3-4 tbsp warm water, swish and swallow. Patient not taking: Reported on 01/14/2020 10/30/19   Noreene Filbert, MD  vitamin B-12 (CYANOCOBALAMIN) 1000 MCG tablet Take 1 tablet (1,000 mcg total) by mouth daily. Patient not taking: Reported on 01/15/2020 10/19/19   Sindy Guadeloupe, MD    Allergies Paxil [paroxetine hcl], Ciprofloxacin, Levaquin [levofloxacin], Propofol, Serzone [nefazodone], Biaxin [clarithromycin], Erythromycin, and Penicillins  Family History  Problem Relation Age of Onset  . Cancer Father        bladder, lung  . Breast cancer Maternal Aunt 32    Social History Social History   Tobacco Use  . Smoking status:  Former Smoker    Packs/day: 1.00    Types: Cigarettes    Quit date: 05/2016    Years since quitting: 3.7  . Smokeless tobacco: Never Used  Substance Use Topics  . Alcohol use: No  . Drug use: No    Review of Systems  Constitutional: No fever/chills Eyes: No visual changes. ENT: No sore throat. Cardiovascular: Denies chest pain. Respiratory: Denies shortness of breath. Gastrointestinal: No abdominal pain.  No nausea, no vomiting.  No diarrhea.  No constipation. Genitourinary: Negative for dysuria. Musculoskeletal: Negative for back pain. Skin: Positive for postoperative infection.  Negative for rash. Neurological: Negative for headaches, focal weakness or numbness.   ____________________________________________   PHYSICAL EXAM:  VITAL SIGNS: ED Triage Vitals  Enc Vitals Group  BP 02/02/20 2226 114/73     Pulse Rate 02/02/20 2226 90     Resp 02/02/20 2226 18     Temp 02/02/20 2226 99.5 F (37.5 C)     Temp Source 02/02/20 2226 Oral     SpO2 02/02/20 2226 96 %     Weight 02/02/20 2226 130 lb (59 kg)     Height 02/02/20 2226 5\' 1"  (1.549 m)     Head Circumference --      Peak Flow --      Pain Score 02/02/20 2236 0     Pain Loc --      Pain Edu? --      Excl. in [place]? --     Constitutional: Alert and oriented. Well appearing and in no acute distress. Eyes: Conjunctivae are normal. PERRL. EOMI. Head: Atraumatic. Nose: No congestion/rhinnorhea. Mouth/Throat: Mucous membranes are moist.  Oropharynx non-erythematous. Neck: No stridor.   Cardiovascular: Normal rate, regular rhythm. Grossly normal heart sounds.  Good peripheral circulation. Respiratory: Normal respiratory effort.  No retractions. Lungs CTAB. Gastrointestinal: Soft and nontender. No distention. No abdominal bruits. No CVA tenderness. Musculoskeletal: No lower extremity tenderness nor edema.  No joint effusions. Neurologic:  Normal speech and language. No gross focal neurologic deficits are  appreciated. No gait instability. Skin:  Skin is warm, dry and intact. No rash noted.  Postoperative site clean/dry/intact.  Mild erythema and warmth over incision site.  No purulence.  No fluctuance. Psychiatric: Mood and affect are normal. Speech and behavior are normal.  ____________________________________________   LABS (all labs ordered are listed, but only abnormal results are displayed)  Labs Reviewed  CBC WITH DIFFERENTIAL/PLATELET - Abnormal; Notable for the following components:      Result Value   RBC 3.52 (*)    Hemoglobin 11.4 (*)    HCT 35.5 (*)    MCV 100.9 (*)    RDW 17.0 (*)    All other components within normal limits  BASIC METABOLIC PANEL - Abnormal; Notable for the following components:   Glucose, Bld 115 (*)    Creatinine, Ser 0.37 (*)    All other components within normal limits  CULTURE, BLOOD (ROUTINE X 2)  CULTURE, BLOOD (ROUTINE X 2)  LACTIC ACID, PLASMA  LACTIC ACID, PLASMA   ____________________________________________  EKG  None ____________________________________________  RADIOLOGY  ED MD interpretation: None  Official radiology report(s): No results found.  ____________________________________________   PROCEDURES  Procedure(s) performed (including Critical Care):  Procedures   ____________________________________________   INITIAL IMPRESSION / ASSESSMENT AND PLAN / ED COURSE  As part of my medical decision making, I reviewed the following data within the [place] notes reviewed and incorporated, Labs reviewed, Old chart reviewed and Notes from prior ED visits     Deborah Nguyen was evaluated in Emergency Department on 02/03/2020 for the symptoms described in the history of present illness. She was evaluated in the context of the global COVID-19 pandemic, which necessitated consideration that the patient might be at risk for infection with the SARS-CoV-2 virus that causes COVID-19. Institutional  protocols and algorithms that pertain to the evaluation of patients at risk for COVID-19 are in a state of rapid change based on information released by regulatory bodies including the CDC and federal and state organizations. These policies and algorithms were followed during the patient's care in the ED.    59 year old female who presents with concerns for postoperative infection.  Differential diagnosis includes but is not limited to  cellulitis, sepsis, etc.  WBC and lactic acid unremarkable.  Low-grade temperature in the ED.  Discussed with patient who would like a course of antibiotics.  Her vascular surgeon is not on-call this weekend and she states she will touch base on Monday.  She is most comfortable with a Z-Pak as she has multiple drug allergies.  Strict return precautions given.  Patient verbalizes understanding and agrees with plan of care.      ____________________________________________   FINAL CLINICAL IMPRESSION(S) / ED DIAGNOSES  Final diagnoses:  Infection of superficial incisional surgical site after procedure, initial encounter     ED Discharge Orders         Ordered    azithromycin (ZITHROMAX) 250 MG tablet  Daily     02/03/20 0309           Note:  This document was prepared using Dragon voice recognition software and may include unintentional dictation errors.   Paulette Blanch, MD 02/03/20 [phone]

## 2020-02-03 NOTE — ED Notes (Signed)
Patent in no distress at this time. Patient given update on wait time. Patient verbalizes understanding.

## 2020-02-04 ENCOUNTER — Inpatient Hospital Stay: Payer: Medicare Other | Attending: Oncology | Admitting: Oncology

## 2020-02-04 ENCOUNTER — Other Ambulatory Visit: Payer: Self-pay

## 2020-02-04 ENCOUNTER — Inpatient Hospital Stay: Payer: Medicare Other

## 2020-02-04 VITALS — BP 119/77 | HR 76 | Temp 98.6°F | Resp 16 | Wt 137.5 lb

## 2020-02-04 DIAGNOSIS — C3431 Malignant neoplasm of lower lobe, right bronchus or lung: Secondary | ICD-10-CM | POA: Insufficient documentation

## 2020-02-04 DIAGNOSIS — T451X5A Adverse effect of antineoplastic and immunosuppressive drugs, initial encounter: Secondary | ICD-10-CM | POA: Diagnosis not present

## 2020-02-04 DIAGNOSIS — C7931 Secondary malignant neoplasm of brain: Secondary | ICD-10-CM | POA: Insufficient documentation

## 2020-02-04 DIAGNOSIS — G893 Neoplasm related pain (acute) (chronic): Secondary | ICD-10-CM | POA: Diagnosis not present

## 2020-02-04 DIAGNOSIS — Z5112 Encounter for antineoplastic immunotherapy: Secondary | ICD-10-CM | POA: Insufficient documentation

## 2020-02-04 DIAGNOSIS — C7972 Secondary malignant neoplasm of left adrenal gland: Secondary | ICD-10-CM | POA: Insufficient documentation

## 2020-02-04 DIAGNOSIS — Z79899 Other long term (current) drug therapy: Secondary | ICD-10-CM | POA: Insufficient documentation

## 2020-02-04 DIAGNOSIS — D6481 Anemia due to antineoplastic chemotherapy: Secondary | ICD-10-CM | POA: Insufficient documentation

## 2020-02-04 DIAGNOSIS — C7971 Secondary malignant neoplasm of right adrenal gland: Secondary | ICD-10-CM | POA: Diagnosis not present

## 2020-02-04 DIAGNOSIS — Z95828 Presence of other vascular implants and grafts: Secondary | ICD-10-CM

## 2020-02-04 LAB — COMPREHENSIVE METABOLIC PANEL
ALT: 14 U/L (ref 0–44)
AST: 18 U/L (ref 15–41)
Albumin: 3.5 g/dL (ref 3.5–5.0)
Alkaline Phosphatase: 100 U/L (ref 38–126)
Anion gap: 10 (ref 5–15)
BUN: 13 mg/dL (ref 6–20)
CO2: 26 mmol/L (ref 22–32)
Calcium: 9.1 mg/dL (ref 8.9–10.3)
Chloride: 102 mmol/L (ref 98–111)
Creatinine, Ser: 0.46 mg/dL (ref 0.44–1.00)
GFR calc Af Amer: >60 mL/min (ref >60)
GFR calc non Af Amer: >60 mL/min (ref >60)
Glucose, Bld: 103 mg/dL — ABNORMAL HIGH (ref 70–99)
Potassium: 3.8 mmol/L (ref 3.5–5.1)
Sodium: 138 mmol/L (ref 135–145)
Total Bilirubin: 0.3 mg/dL (ref 0.3–1.2)
Total Protein: 6.9 g/dL (ref 6.5–8.1)

## 2020-02-04 LAB — CBC WITH DIFFERENTIAL/PLATELET
Abs Immature Granulocytes: 0.01 10*3/uL (ref 0.00–0.07)
Basophils Absolute: 0.1 10*3/uL (ref 0.0–0.1)
Basophils Relative: 1 %
Eosinophils Absolute: 0 10*3/uL (ref 0.0–0.5)
Eosinophils Relative: 0 %
HCT: 34 % — ABNORMAL LOW (ref 36.0–46.0)
Hemoglobin: 10.6 g/dL — ABNORMAL LOW (ref 12.0–15.0)
Immature Granulocytes: 0 %
Lymphocytes Relative: 15 %
Lymphs Abs: 0.9 10*3/uL (ref 0.7–4.0)
MCH: 31.8 pg (ref 26.0–34.0)
MCHC: 31.2 g/dL (ref 30.0–36.0)
MCV: 102.1 fL — ABNORMAL HIGH (ref 80.0–100.0)
Monocytes Absolute: 0.8 10*3/uL (ref 0.1–1.0)
Monocytes Relative: 13 %
Neutro Abs: 4.2 10*3/uL (ref 1.7–7.7)
Neutrophils Relative %: 71 %
Platelets: 223 10*3/uL (ref 150–400)
RBC: 3.33 MIL/uL — ABNORMAL LOW (ref 3.87–5.11)
RDW: 16.2 % — ABNORMAL HIGH (ref 11.5–15.5)
WBC: 6 10*3/uL (ref 4.0–10.5)
nRBC: 0 % (ref 0.0–0.2)

## 2020-02-04 LAB — TSH: TSH: 4.116 u[IU]/mL (ref 0.350–4.500)

## 2020-02-04 MED ORDER — SODIUM CHLORIDE 0.9 % IV SOLN
Freq: Once | INTRAVENOUS | Status: AC
  Filled 2020-02-04: qty 250

## 2020-02-04 MED ORDER — SODIUM CHLORIDE 0.9 % IV SOLN
200.0000 mg | Freq: Once | INTRAVENOUS | Status: AC
  Administered 2020-02-04: 200 mg via INTRAVENOUS
  Filled 2020-02-04: qty 8

## 2020-02-04 MED ORDER — HEPARIN SOD (PORK) LOCK FLUSH 100 UNIT/ML IV SOLN
INTRAVENOUS | Status: AC
  Filled 2020-02-04: qty 5

## 2020-02-04 MED ORDER — HEPARIN SOD (PORK) LOCK FLUSH 100 UNIT/ML IV SOLN
500.0000 [IU] | Freq: Once | INTRAVENOUS | Status: AC | PRN
  Administered 2020-02-04: 500 [IU]
  Filled 2020-02-04: qty 5

## 2020-02-04 MED ORDER — SODIUM CHLORIDE 0.9% FLUSH
10.0000 mL | Freq: Once | INTRAVENOUS | Status: AC
  Administered 2020-02-04: 10 mL via INTRAVENOUS
  Filled 2020-02-04: qty 10

## 2020-02-04 NOTE — Progress Notes (Signed)
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  Patient followed by Dr. Janese Banks and Palliative care.    RD was hoping to see patient in infusion area but discharged before RD was able to see her.    Called and spoke with sister.  Sister reports appetite is ok.  Reports taste alterations.  Some soreness in mouth and is using magic mouthwash with success.  Sister reports that patient is trying to eat as much as she can.     Medications: reviewed  Labs: reviewed  Anthropometrics:   Weight 137 lb today increased from 133 lb on 2/26.  Sister reports swelling in ankles and legs.     NUTRITION DIAGNOSIS: Inadequate oral intake stable   INTERVENTION:  Encouraged high calories, high protein foods. Discussed strategies to help with taste alterations.  Sister has contact information.    MONITORING, EVALUATION, GOAL: Patient will consume adequate calories and protein to maintain lean muscle mass   NEXT VISIT: March 22 during infusion  Kamonte Mcmichen B. Zenia Resides, [place], [place] Registered Dietitian [phone] (pager)

## 2020-02-04 NOTE — Progress Notes (Signed)
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 She presented to Dr. Wilburn Cornelia symptoms of right flank pain which was radiating to her front which prompted a CT abdomen. CT abdomen pelvis with contrast showed a 3.7 x 2.8 x 5 cm right adrenal mass. 1.7 x 1.2 x 2.1 cm left adrenal nodule 1.8 cm right renal simple cyst. And a new 3.6 x 3.2 cm spiculated mass in the right lower lobe with tethering of the adjacent pleura. Prominent hilar lymph nodes. This was followed by a PET CT scan on 09/20/2019 which showed a 3.6 cm right lower lobe mass which was markedly hypermetabolic along with hypermetabolic metastatic disease in the right supraclavicular space as well as mediastinum and right hilum and hypermetabolic metastatic disease in bilateral adrenal glands. She will also noted to have bilateral hypermetabolic thyroid nodules and a possible nodule in the pelvis adjacent to the sigmoid colon.  Patient currently reports ongoing pain in her right flank which is relatively well controlled with hydrocodone. She also has occasional nausea for which she has as needed nausea medications. She is here with her twin sister today and is highly anxious Of note patient has a history of central core myopathy and follows up with neuromuscular clinic at [facility]. Patient states that her twin  sister has had episodes of hyperthermia but patient herself has not had these episodes. I did review [facility] neuromuscular clinic note and the hyperthermia has been attributed to her history of hyperthyroidism and thyroid dysfunction rather than malignant hyperthermia. However given her history of myopathy patient does carry risk of malignant hyperthermia.  Not enough tissue was available for NGS testing. Patient therefore underwentLiquid biopsy which showed evidence of PI K3 CA amplification, RIC POR amplification, T p53  Patient developed significant scalp and body rash after cycle 1 of carbotaxol Keytruda. This resolved after a trial of steroids and Keytruda was held for cycle 2. Interim scans after 4 cycles showed response to treatment  Interval history-patient reports pain in her flanks is relatively well controlled and she does not use more than 1 or 2 doses of oxycodone.  Bowel movements are regular.  She did have issues with her port which was replaced and currently patient is on Z-Pak when she presented to the ER with an episode of fever.  ECOG PS- 1 Pain scale- 0 Opioid associated constipation- no  Review of systems- Review of Systems  Constitutional: Positive for malaise/fatigue. Negative for chills, fever and weight loss.  HENT: Negative for congestion, ear discharge and nosebleeds.   Eyes: Negative for blurred vision.  Respiratory: Negative for cough, hemoptysis, sputum production, shortness of breath and wheezing.   Cardiovascular: Negative for chest pain, palpitations, orthopnea and claudication.  Gastrointestinal: Negative for abdominal pain, blood in stool, constipation, diarrhea, heartburn, melena, nausea and vomiting.  Genitourinary: Negative for dysuria, flank pain, frequency, hematuria and urgency.  Musculoskeletal: Negative for back pain, joint pain  and myalgias.  Skin: Negative for rash.  Neurological: Negative for dizziness, tingling, focal weakness, seizures,  weakness and headaches.       Gait imbalance  Endo/Heme/Allergies: Does not bruise/bleed easily.  Psychiatric/Behavioral: Negative for depression and suicidal ideas. The patient does not have insomnia.       Allergies  Allergen Reactions  . Paxil [Paroxetine Hcl] Other (See Comments)    Hallucinations   . Ciprofloxacin Other (See Comments)    Unknown  . Levaquin [Levofloxacin] Other (See Comments)    Unknown   . 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 She has an increase risk of malignant hyperthermia with anesthesia. She should not receive Propofol.   Donne Hazel [Nefazodone] Other (See Comments)    Unknown   . Biaxin [Clarithromycin] Nausea Only  . Erythromycin Nausea Only  . Penicillins Rash     Past Medical History:  Diagnosis Date  . Anxiety   . Arthritis   . Chronic kidney disease   . Family history of breast cancer    aunt  . GERD (gastroesophageal reflux disease)   . History of DVT of lower extremity    Left leg  . Lung cancer ([place])    with brain mets  . Lung mass   . Malignant hyperthermia   . 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 Muscular dystrophy ([place])   . Muscular dystrophy [facility])      Past Surgical History:  Procedure Laterality Date  . CESAREAN SECTION  11/1985  . COLONOSCOPY N/A 10/27/2015   Procedure: COLONOSCOPY;  Surgeon: Manya Silvas, MD;  Location: [facility] ENDOSCOPY;  Service: Endoscopy;  Laterality: N/A;   NO Propofol - per office  . LITHOTRIPSY    . PORTA CATH INSERTION N/A 11/09/2019   Procedure: PORTA CATH INSERTION;  Surgeon: Algernon Huxley, MD;  Location: [place] CV LAB;  Service: Cardiovascular;  Laterality: N/A;  . PORTA CATH INSERTION N/A 01/23/2020   Procedure: PORTA CATH INSERTION;  Surgeon: Algernon Huxley, MD;  Location: [place] CV LAB;  Service: Cardiovascular;  Laterality: N/A;  . PORTA CATH INSERTION N/A 02/01/2020    Procedure: PORTA CATH INSERTION;  Surgeon: Algernon Huxley, MD;  Location: [place] CV LAB;  Service: Cardiovascular;  Laterality: N/A;  . TUBAL LIGATION  03/1986    Social History   Socioeconomic History  . Marital status: Divorced    Spouse name: Not on file  . Number of children: Not on file  . Years of education: Not on file  . Highest education level: Not on file  Occupational History  . Not on file  Tobacco Use  . Smoking status: Former Smoker    Packs/day: 1.00    Types: Cigarettes    Quit date: 05/2016    Years since quitting: 3.7  . Smokeless tobacco: Never Used  Substance and Sexual Activity  . Alcohol use: No  . Drug use: No  . Sexual activity: Not Currently  Other Topics Concern  . Not on file  Social History Narrative  . Not on file   Social Determinants of Health   Financial Resource Strain:   . Difficulty of Paying Living Expenses: Not on file  Food Insecurity:   . Worried About Charity fundraiser in the Last Year: Not on file  . Ran Out of Food in the Last Year: Not on  file  Transportation Needs:   . Film/video editor (Medical): Not on file  . Lack of Transportation (Non-Medical): Not on file  Physical Activity:   . Days of Exercise per Week: Not on file  . Minutes of Exercise per Session: Not on file  Stress:   . Feeling of Stress : Not on file  Social Connections:   . Frequency of Communication with Friends and Family: Not on file  . Frequency of Social Gatherings with Friends and Family: Not on file  . Attends Religious Services: Not on file  . Active Member of Clubs or Organizations: Not on file  . Attends Archivist Meetings: Not on file  . Marital Status: Not on file  Intimate Partner Violence:   . Fear of Current or Ex-Partner: Not on file  . Emotionally Abused: Not on file  . Physically Abused: Not on file  . Sexually Abused: Not on file    Family History  Problem Relation Age of Onset  . Cancer Father         bladder, lung  . Breast cancer Maternal Aunt 45     Current Outpatient Medications:  .  acetaminophen (TYLENOL) 325 MG tablet, Take 650 mg by mouth every 6 (six) hours as needed., Disp: , Rfl:  .  ALPRAZolam (XANAX) 0.25 MG tablet, Take 0.25 mg by mouth at bedtime as needed for anxiety., Disp: , Rfl:  .  apixaban (ELIQUIS) 5 MG TABS tablet, Take 1 tablet (5 mg total) by mouth 2 (two) times daily., Disp: 60 tablet, Rfl: 2 .  azithromycin (ZITHROMAX) 250 MG tablet, Take 1 tablet (250 mg total) by mouth daily., Disp: 4 each, Rfl: 0 .  citalopram (CELEXA) 40 MG tablet, Take 1 tablet (40 mg total) by mouth daily., Disp: 30 tablet, Rfl: 3 .  fentaNYL (DURAGESIC) 12 MCG/HR, PLACE 1 PATCH ONTO THE SKIN EVERY 3 DAYS, Disp: 5 patch, Rfl: 0 .  fluticasone (FLONASE) 50 MCG/ACT nasal spray, Place 2 sprays into both nostrils daily as needed. , Disp: , Rfl:  .  lidocaine-prilocaine (EMLA) cream, Apply to affected area once, Disp: 30 g, Rfl: 3 .  magic mouthwash SOLN, Take 5 mLs by mouth 3 (three) times daily as needed for mouth pain., Disp: 300 mL, Rfl: 1 .  methimazole (TAPAZOLE) 5 MG tablet, Take 5 mg by mouth daily., Disp: , Rfl:  .  nystatin (MYCOSTATIN) 100000 UNIT/ML suspension, Take 5 mLs (500,000 Units total) by mouth 4 (four) times daily. Swish and spit., Disp: 300 mL, Rfl: 0 .  ondansetron (ZOFRAN) 8 MG tablet, Take 1 tablet (8 mg total) by mouth 2 (two) times daily as needed for refractory nausea / vomiting. Start on day 3 after carboplatin chemo., Disp: 30 tablet, Rfl: 1 .  Oxycodone HCl 10 MG TABS, Take 1 tablet (10 mg total) by mouth every 4 (four) hours as needed., Disp: 60 tablet, Rfl: 0 .  pantoprazole (PROTONIX) 20 MG tablet, TAKE 1 TABLET BY MOUTH 2 TIMES DAILY, Disp: 120 tablet, Rfl: 0 .  prochlorperazine (COMPAZINE) 10 MG tablet, Take 1 tablet (10 mg total) by mouth every 6 (six) hours as needed (Nausea or vomiting)., Disp: 30 tablet, Rfl: 1 .  sucralfate (CARAFATE) 1 g tablet, Take 1  tablet (1 g total) by mouth 3 (three) times daily. Dissolve in 3-4 tbsp warm water, swish and swallow. (Patient not taking: Reported on 01/14/2020), Disp: 90 tablet, Rfl: 3 .  vitamin B-12 (CYANOCOBALAMIN) 1000 MCG tablet, Take 1 tablet (  1,000 mcg total) by mouth daily. (Patient not taking: Reported on 01/15/2020), Disp: 30 tablet, Rfl: 1  Physical exam:  Vitals:   02/04/20 1306 02/04/20 1314 02/04/20 1320  BP:   119/77  Pulse:   76  Resp:   16  Temp:   98.6 F (37 C)  TempSrc:   Tympanic  Weight: 137 lb 8 oz (62.4 kg) 137 lb 8 oz (62.4 kg) 137 lb 8 oz (62.4 kg)   Physical Exam Constitutional:      General: She is not in acute distress. HENT:     Head: Normocephalic and atraumatic.  Eyes:     Pupils: Pupils are equal, round, and reactive to light.  Cardiovascular:     Rate and Rhythm: Normal rate and regular rhythm.     Heart sounds: Normal heart sounds.  Pulmonary:     Effort: Pulmonary effort is normal.     Breath sounds: Normal breath sounds.  Abdominal:     General: Bowel sounds are normal.     Palpations: Abdomen is soft.  Musculoskeletal:     Cervical back: Normal range of motion.  Skin:    General: Skin is warm and dry.  Neurological:     Mental Status: She is alert and oriented to person, place, and time.   There is mild erythema noted around the port site which otherwise does not appear infected  CMP Latest Ref Rng & Units 02/04/2020  Glucose 70 - 99 mg/dL 103(H)  BUN 6 - 20 mg/dL 13  Creatinine 0.44 - 1.00 mg/dL 0.46  Sodium 135 - 145 mmol/L 138  Potassium 3.5 - 5.1 mmol/L 3.8  Chloride 98 - 111 mmol/L 102  CO2 22 - 32 mmol/L 26  Calcium 8.9 - 10.3 mg/dL 9.1  Total Protein 6.5 - 8.1 g/dL 6.9  Total Bilirubin 0.3 - 1.2 mg/dL 0.3  Alkaline Phos 38 - 126 U/L 100  AST 15 - 41 U/L 18  ALT 0 - 44 U/L 14   CBC Latest Ref Rng & Units 02/04/2020  WBC 4.0 - 10.5 K/uL 6.0  Hemoglobin 12.0 - 15.0 g/dL 10.6(L)  Hematocrit 36.0 - 46.0 % 34.0(L)  Platelets 150 - 400 K/uL  223    No images are attached to the encounter.  CT Chest W Contrast  Result Date: 01/28/2020 CLINICAL DATA:  Restaging lung cancer. EXAM: CT CHEST, ABDOMEN, AND PELVIS WITH CONTRAST TECHNIQUE: Multidetector CT imaging of the chest, abdomen and pelvis was performed following the standard protocol during bolus administration of intravenous contrast. CONTRAST:  165m OMNIPAQUE IOHEXOL 300 MG/ML  SOLN COMPARISON:  12/20/2019 FINDINGS: CT CHEST FINDINGS Cardiovascular: The heart size appears within normal limits. Mild aortic atherosclerosis. Mediastinum/Nodes: 1.3 cm low-density nodule is identified within left lobe of thyroid gland. The trachea appears patent and is midline. Normal appearance of the esophagus. No enlarged mediastinal or hilar lymph nodes. No axillary or supraclavicular adenopathy. Lungs/Pleura: No pleural effusion. Centrilobular emphysema. Calcified granuloma within the anterolateral right lower lobe. The treated lesion within the right lower lobe is again identified 1.6 x 1.4 cm, image 94/3. Previously 2.2 x 2.1 cm. No new lung nodules identified. Musculoskeletal: No chest wall mass or suspicious bone lesions identified. CT ABDOMEN PELVIS FINDINGS Hepatobiliary: No focal liver abnormality is seen. No gallstones, gallbladder wall thickening, or biliary dilatation. Pancreas: Unremarkable. No pancreatic ductal dilatation or surrounding inflammatory changes. Spleen: Normal in size without focal abnormality. Adrenals/Urinary Tract: Right adrenal mass measures 2.3 x 1.9 cm, image 51/2. Previously 3.1 x  2.3 cm. The left adrenal lesion measures 2.5 x 1.4 cm, image 51/2. Previously 3.5 by 2.3 cm. Right kidney cyst. No suspicious kidney mass or hydronephrosis. The urinary bladder is unremarkable. Stomach/Bowel: Stomach is within normal limits. Appendix appears normal. No evidence of bowel wall thickening, distention, or inflammatory changes. Vascular/Lymphatic: Aortic atherosclerosis. No aneurysm  identified. No abdominopelvic adenopathy. Reproductive: Uterus and bilateral adnexa are unremarkable. Other: No free fluid or fluid collections. Musculoskeletal: No acute or significant osseous findings. IMPRESSION: 1. Interval response to therapy. The right lower lobe lung lesion demonstrates mild decrease in size. 2. Interval improvement in bilateral adrenal gland metastasis. 3. No new sites of disease. 4. Aortic Atherosclerosis (ICD10-I70.0) and Emphysema (ICD10-J43.9). Electronically Signed   By: Kerby Moors M.D.   On: 01/28/2020 14:24   CT Abdomen Pelvis W Contrast  Result Date: 01/28/2020 CLINICAL DATA:  Restaging lung cancer. EXAM: CT CHEST, ABDOMEN, AND PELVIS WITH CONTRAST TECHNIQUE: Multidetector CT imaging of the chest, abdomen and pelvis was performed following the standard protocol during bolus administration of intravenous contrast. CONTRAST:  140m OMNIPAQUE IOHEXOL 300 MG/ML  SOLN COMPARISON:  12/20/2019 FINDINGS: CT CHEST FINDINGS Cardiovascular: The heart size appears within normal limits. Mild aortic atherosclerosis. Mediastinum/Nodes: 1.3 cm low-density nodule is identified within left lobe of thyroid gland. The trachea appears patent and is midline. Normal appearance of the esophagus. No enlarged mediastinal or hilar lymph nodes. No axillary or supraclavicular adenopathy. Lungs/Pleura: No pleural effusion. Centrilobular emphysema. Calcified granuloma within the anterolateral right lower lobe. The treated lesion within the right lower lobe is again identified 1.6 x 1.4 cm, image 94/3. Previously 2.2 x 2.1 cm. No new lung nodules identified. Musculoskeletal: No chest wall mass or suspicious bone lesions identified. CT ABDOMEN PELVIS FINDINGS Hepatobiliary: No focal liver abnormality is seen. No gallstones, gallbladder wall thickening, or biliary dilatation. Pancreas: Unremarkable. No pancreatic ductal dilatation or surrounding inflammatory changes. Spleen: Normal in size without focal  abnormality. Adrenals/Urinary Tract: Right adrenal mass measures 2.3 x 1.9 cm, image 51/2. Previously 3.1 x 2.3 cm. The left adrenal lesion measures 2.5 x 1.4 cm, image 51/2. Previously 3.5 by 2.3 cm. Right kidney cyst. No suspicious kidney mass or hydronephrosis. The urinary bladder is unremarkable. Stomach/Bowel: Stomach is within normal limits. Appendix appears normal. No evidence of bowel wall thickening, distention, or inflammatory changes. Vascular/Lymphatic: Aortic atherosclerosis. No aneurysm identified. No abdominopelvic adenopathy. Reproductive: Uterus and bilateral adnexa are unremarkable. Other: No free fluid or fluid collections. Musculoskeletal: No acute or significant osseous findings. IMPRESSION: 1. Interval response to therapy. The right lower lobe lung lesion demonstrates mild decrease in size. 2. Interval improvement in bilateral adrenal gland metastasis. 3. No new sites of disease. 4. Aortic Atherosclerosis (ICD10-I70.0) and Emphysema (ICD10-J43.9). Electronically Signed   By: TKerby MoorsM.D.   On: 01/28/2020 14:24   PERIPHERAL VASCULAR CATHETERIZATION  Result Date: 02/01/2020 See op note  PERIPHERAL VASCULAR CATHETERIZATION  Result Date: 01/23/2020 See op note    Assessment and plan- Patient is a 59y.o. female withsquamous cell carcinoma of the lung stage IV [id number] with adrenal and brain metastases.  She is here for on treatment assessment prior to cycle 1 of maintenance Keytruda and to discuss the results of CT scan  I have reviewed CT chest abdomen and pelvis images independently and discussed findings with the patient.  After 4 cycles of carbotaxol and Keytruda patient has had a positive response to treatment.  There is reduction in the size of bilateral adrenal mets as well as  right lower lobe lung mass.  Mediastinal adenopathy has resolved.  Plan at this time would be to continue Keytruda until progression or toxicity.  Proceed with cycle 1 of maintenance Keytruda  today.  She has moderate normocytic anemia likely secondary to chemotherapy which we will continue to monitor.  Baseline TSH is normal.  I will see her back in 3 weeks for cycle 2 of maintenance Keytruda.  Patient also has known brain metastases and s/p whole brain radiation.  I will repeat an MRI brain at this time.  Since her radiation treatment patient reports ongoing gait imbalance as well as mild cognitive decline which I suspect is the long-term side effect of her radiation treatment  Neoplasm related pain: Continue as needed oxycodone   Chemo-induced anemia: Stable around 10 continue to monitor Visit Diagnosis 1. Encounter for antineoplastic immunotherapy   2. Malignant neoplasm of lower lobe of right lung ([place])   3. Neoplasm related pain   4. Brain metastases ([place])   5. Antineoplastic chemotherapy induced anemia      Dr. Randa Evens, MD, MPH [facility] [phone] 02/04/2020 5:32 PM

## 2020-02-05 ENCOUNTER — Other Ambulatory Visit: Payer: Self-pay | Admitting: Oncology

## 2020-02-05 MED ORDER — FENTANYL 12 MCG/HR TD PT72: 1.0000 | MEDICATED_PATCH | TRANSDERMAL | 0 refills | Status: DC

## 2020-02-08 ENCOUNTER — Encounter: Payer: Self-pay | Admitting: Oncology

## 2020-02-08 ENCOUNTER — Other Ambulatory Visit: Payer: Self-pay

## 2020-02-08 ENCOUNTER — Telehealth: Payer: Self-pay | Admitting: *Deleted

## 2020-02-08 ENCOUNTER — Inpatient Hospital Stay (HOSPITAL_BASED_OUTPATIENT_CLINIC_OR_DEPARTMENT_OTHER): Payer: Medicare Other | Admitting: Oncology

## 2020-02-08 VITALS — BP 117/75 | HR 75 | Temp 98.0°F | Resp 18 | Wt 137.7 lb

## 2020-02-08 DIAGNOSIS — Z5112 Encounter for antineoplastic immunotherapy: Secondary | ICD-10-CM | POA: Diagnosis not present

## 2020-02-08 DIAGNOSIS — R220 Localized swelling, mass and lump, head: Secondary | ICD-10-CM | POA: Diagnosis not present

## 2020-02-08 LAB — CULTURE, BLOOD (ROUTINE X 2): Culture: NO GROWTH

## 2020-02-08 MED ORDER — FUROSEMIDE 20 MG PO TABS: 20.0000 mg | ORAL_TABLET | ORAL | 0 refills | Status: DC | PRN

## 2020-02-08 NOTE — Progress Notes (Signed)
Patient stated that she had been doing well with no complaints except swelling on her face.

## 2020-02-08 NOTE — Telephone Encounter (Signed)
Pt's sister called to report that patient is experiencing facial swelling with the right side worse than the left side. Sister was concerned and wanted to let Dr. Janese Banks know for further recommendations.

## 2020-02-08 NOTE — Telephone Encounter (Signed)
I can see her today at 11 am

## 2020-02-08 NOTE — Telephone Encounter (Signed)
Pt added to schedule at 11am. Pt's sister made aware.

## 2020-02-11 NOTE — Progress Notes (Signed)
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 She presented to Dr. Wilburn Cornelia symptoms of right flank pain which was radiating to her front which prompted a CT abdomen. CT abdomen pelvis with contrast showed a 3.7 x 2.8 x 5 cm right adrenal mass. 1.7 x 1.2 x 2.1 cm left adrenal nodule 1.8 cm right renal simple cyst. And a new 3.6 x 3.2 cm spiculated mass in the right lower lobe with tethering of the adjacent pleura. Prominent hilar lymph nodes. This was followed by a PET CT scan on 09/20/2019 which showed a 3.6 cm right lower lobe mass which was markedly hypermetabolic along with hypermetabolic metastatic disease in the right supraclavicular space as well as mediastinum and right hilum and hypermetabolic metastatic disease in bilateral adrenal glands. She will also noted to have bilateral hypermetabolic thyroid nodules and a possible nodule in the pelvis adjacent to the sigmoid colon.  Patient currently reports ongoing pain in her right flank which is relatively well controlled with hydrocodone. She also has occasional nausea for which she has as needed nausea medications. She is here with her twin sister today and is highly anxious Of note patient has a history of central core myopathy and follows up with neuromuscular clinic at [facility]. Patient states that her twin sister has had episodes of  hyperthermia but patient herself has not had these episodes. I did review [facility] neuromuscular clinic note and the hyperthermia has been attributed to her history of hyperthyroidism and thyroid dysfunction rather than malignant hyperthermia. However given her history of myopathy patient does carry risk of malignant hyperthermia.  Not enough tissue was available for NGS testing. Patient therefore underwentLiquid biopsy which showed evidence of PI K3 CA amplification, RIC POR amplification, T p53  Patient developed significant scalp and body rash after cycle 1 of carbotaxol Keytruda. This resolved after a trial of steroids and Keytruda was held for cycle 2. Interim scans after 4 cycles showed response to treatment   Interval history-feels that her right side of the face has been swollen over the last 2 to 3 days.  Also reports generalized bloating and feels that her legs swell up after each treatment.  Nausea is better and flank pain is well controlled  ECOG PS- 1 Pain scale- 2 Opioid associated constipation- no  Review of systems- Review of Systems  Constitutional: Positive for malaise/fatigue. Negative for chills, fever and weight loss.  HENT: Negative for congestion, ear discharge and nosebleeds.        Facial swelling  Eyes: Negative for blurred vision.  Respiratory: Negative for cough, hemoptysis, sputum production, shortness of breath and wheezing.   Cardiovascular: Negative for chest pain, palpitations, orthopnea and claudication.  Gastrointestinal: Negative for abdominal pain, blood in stool, constipation, diarrhea, heartburn, melena, nausea and vomiting.  Genitourinary: Negative for dysuria, flank pain, frequency, hematuria and urgency.  Musculoskeletal: Negative for back pain, joint pain and myalgias.  Skin: Negative for rash.  Neurological: Negative  for dizziness, tingling, focal weakness, seizures, weakness and headaches.  Endo/Heme/Allergies: Does not bruise/bleed easily.    Psychiatric/Behavioral: Negative for depression and suicidal ideas. The patient does not have insomnia.       Allergies  Allergen Reactions  . Paxil [Paroxetine Hcl] Other (See Comments)    Hallucinations   . Ciprofloxacin Other (See Comments)    Unknown  . Levaquin [Levofloxacin] Other (See Comments)    Unknown   . 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 She has an increase risk of malignant hyperthermia with anesthesia. She should not receive Propofol.   Donne Hazel [Nefazodone] Other (See Comments)    Unknown   . Biaxin [Clarithromycin] Nausea Only  . Erythromycin Nausea Only  . Penicillins Rash     Past Medical History:  Diagnosis Date  . Anxiety   . Arthritis   . Chronic kidney disease   . Family history of breast cancer    aunt  . GERD (gastroesophageal reflux disease)   . History of DVT of lower extremity    Left leg  . Lung cancer ([place])    with brain mets  . Lung mass   . Malignant hyperthermia   . 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 Muscular dystrophy ([place])   . Muscular dystrophy [facility])      Past Surgical History:  Procedure Laterality Date  . CESAREAN SECTION  11/1985  . COLONOSCOPY N/A 10/27/2015   Procedure: COLONOSCOPY;  Surgeon: Manya Silvas, MD;  Location: [facility] ENDOSCOPY;  Service: Endoscopy;  Laterality: N/A;   NO Propofol - per office  . LITHOTRIPSY    . PORTA CATH INSERTION N/A 11/09/2019   Procedure: PORTA CATH INSERTION;  Surgeon: Algernon Huxley, MD;  Location: [place] CV LAB;  Service: Cardiovascular;  Laterality: N/A;  . PORTA CATH INSERTION N/A 01/23/2020   Procedure: PORTA CATH INSERTION;  Surgeon: Algernon Huxley, MD;  Location: [place] CV LAB;  Service: Cardiovascular;  Laterality: N/A;  . PORTA CATH INSERTION N/A 02/01/2020   Procedure: PORTA CATH INSERTION;  Surgeon: Algernon Huxley, MD;  Location: [place] CV LAB;   Service: Cardiovascular;  Laterality: N/A;  . TUBAL LIGATION  03/1986    Social History   Socioeconomic History  . Marital status: Divorced    Spouse name: Not on file  . Number of children: Not on file  . Years of education: Not on file  . Highest education level: Not on file  Occupational History  . Not on file  Tobacco Use  . Smoking status: Former Smoker    Packs/day: 1.00    Types: Cigarettes    Quit date: 05/2016    Years since quitting: 3.7  . Smokeless tobacco: Never Used  Substance and Sexual Activity  . Alcohol use: No  . Drug use: No  . Sexual activity: Not Currently  Other Topics Concern  . Not on file  Social History Narrative  . Not on file   Social Determinants of Health   Financial Resource Strain:   . Difficulty of Paying Living Expenses: Not on file  Food Insecurity:   . Worried About Charity fundraiser in the Last Year: Not on file  . Ran Out of Food in the Last Year: Not on file  Transportation Needs:   . Lack of Transportation (Medical): Not on file  . Lack  of Transportation (Non-Medical): Not on file  Physical Activity:   . Days of Exercise per Week: Not on file  . Minutes of Exercise per Session: Not on file  Stress:   . Feeling of Stress : Not on file  Social Connections:   . Frequency of Communication with Friends and Family: Not on file  . Frequency of Social Gatherings with Friends and Family: Not on file  . Attends Religious Services: Not on file  . Active Member of Clubs or Organizations: Not on file  . Attends Archivist Meetings: Not on file  . Marital Status: Not on file  Intimate Partner Violence:   . Fear of Current or Ex-Partner: Not on file  . Emotionally Abused: Not on file  . Physically Abused: Not on file  . Sexually Abused: Not on file    Family History  Problem Relation Age of Onset  . Cancer Father        bladder, lung  . Breast cancer Maternal Aunt 45     Current Outpatient Medications:  .   acetaminophen (TYLENOL) 325 MG tablet, Take 650 mg by mouth every 6 (six) hours as needed., Disp: , Rfl:  .  ALPRAZolam (XANAX) 0.25 MG tablet, Take 0.25 mg by mouth at bedtime as needed for anxiety., Disp: , Rfl:  .  apixaban (ELIQUIS) 5 MG TABS tablet, Take 1 tablet (5 mg total) by mouth 2 (two) times daily., Disp: 60 tablet, Rfl: 2 .  azithromycin (ZITHROMAX) 250 MG tablet, Take 1 tablet (250 mg total) by mouth daily., Disp: 4 each, Rfl: 0 .  citalopram (CELEXA) 40 MG tablet, Take 1 tablet (40 mg total) by mouth daily., Disp: 30 tablet, Rfl: 3 .  fentaNYL (DURAGESIC) 12 MCG/HR, Place 1 patch onto the skin every 3 (three) days., Disp: 10 patch, Rfl: 0 .  fluticasone (FLONASE) 50 MCG/ACT nasal spray, Place 2 sprays into both nostrils daily as needed. , Disp: , Rfl:  .  lidocaine-prilocaine (EMLA) cream, Apply to affected area once, Disp: 30 g, Rfl: 3 .  magic mouthwash SOLN, Take 5 mLs by mouth 3 (three) times daily as needed for mouth pain., Disp: 300 mL, Rfl: 1 .  methimazole (TAPAZOLE) 5 MG tablet, Take 5 mg by mouth daily., Disp: , Rfl:  .  nystatin (MYCOSTATIN) 100000 UNIT/ML suspension, Take 5 mLs (500,000 Units total) by mouth 4 (four) times daily. Swish and spit., Disp: 300 mL, Rfl: 0 .  OLANZapine (ZYPREXA) 10 MG tablet, Take 10 mg by mouth at bedtime., Disp: , Rfl:  .  ondansetron (ZOFRAN) 8 MG tablet, Take 1 tablet (8 mg total) by mouth 2 (two) times daily as needed for refractory nausea / vomiting. Start on day 3 after carboplatin chemo., Disp: 30 tablet, Rfl: 1 .  ondansetron (ZOFRAN-ODT) 8 MG disintegrating tablet, Take 8 mg by mouth 3 (three) times daily., Disp: , Rfl:  .  Oxycodone HCl 10 MG TABS, Take 1 tablet (10 mg total) by mouth every 4 (four) hours as needed., Disp: 60 tablet, Rfl: 0 .  pantoprazole (PROTONIX) 20 MG tablet, TAKE 1 TABLET BY MOUTH 2 TIMES DAILY, Disp: 120 tablet, Rfl: 0 .  prochlorperazine (COMPAZINE) 10 MG tablet, Take 1 tablet (10 mg total) by mouth every 6  (six) hours as needed (Nausea or vomiting)., Disp: 30 tablet, Rfl: 1 .  sucralfate (CARAFATE) 1 g tablet, Take 1 tablet (1 g total) by mouth 3 (three) times daily. Dissolve in 3-4 tbsp warm water, swish and  swallow., Disp: 90 tablet, Rfl: 3 .  vitamin B-12 (CYANOCOBALAMIN) 1000 MCG tablet, Take 1 tablet (1,000 mcg total) by mouth daily., Disp: 30 tablet, Rfl: 1 .  furosemide (LASIX) 20 MG tablet, Take 1 tablet (20 mg total) by mouth as needed., Disp: 30 tablet, Rfl: 0  Physical exam:  Vitals:   02/08/20 1126  BP: 117/75  Pulse: 75  Resp: 18  Temp: 98 F (36.7 C)  TempSrc: Tympanic  SpO2: 100%  Weight: 137 lb 11.2 oz (62.5 kg)   Physical Exam HENT:     Head: Normocephalic and atraumatic.     Comments: Right side of the face appears mildly swollen as compared to the left side.    Nose:     Comments: No sinus tenderness    Mouth/Throat:     Pharynx: Oropharynx is clear.     Comments: No dental infection or abscess noted.  Patient has chronic dental fillings Eyes:     Pupils: Pupils are equal, round, and reactive to light.  Cardiovascular:     Rate and Rhythm: Normal rate and regular rhythm.     Heart sounds: Normal heart sounds.  Pulmonary:     Effort: Pulmonary effort is normal.     Breath sounds: Normal breath sounds.  Abdominal:     General: Bowel sounds are normal.     Palpations: Abdomen is soft.  Musculoskeletal:     Cervical back: Normal range of motion.     Right lower leg: No edema.     Left lower leg: No edema.  Skin:    General: Skin is warm and dry.  Neurological:     Mental Status: She is alert and oriented to person, place, and time.      CMP Latest Ref Rng & Units 02/04/2020  Glucose 70 - 99 mg/dL 103(H)  BUN 6 - 20 mg/dL 13  Creatinine 0.44 - 1.00 mg/dL 0.46  Sodium 135 - 145 mmol/L 138  Potassium 3.5 - 5.1 mmol/L 3.8  Chloride 98 - 111 mmol/L 102  CO2 22 - 32 mmol/L 26  Calcium 8.9 - 10.3 mg/dL 9.1  Total Protein 6.5 - 8.1 g/dL 6.9  Total  Bilirubin 0.3 - 1.2 mg/dL 0.3  Alkaline Phos 38 - 126 U/L 100  AST 15 - 41 U/L 18  ALT 0 - 44 U/L 14   CBC Latest Ref Rng & Units 02/04/2020  WBC 4.0 - 10.5 K/uL 6.0  Hemoglobin 12.0 - 15.0 g/dL 10.6(L)  Hematocrit 36.0 - 46.0 % 34.0(L)  Platelets 150 - 400 K/uL 223    No images are attached to the encounter.  CT Chest W Contrast  Result Date: 01/28/2020 CLINICAL DATA:  Restaging lung cancer. EXAM: CT CHEST, ABDOMEN, AND PELVIS WITH CONTRAST TECHNIQUE: Multidetector CT imaging of the chest, abdomen and pelvis was performed following the standard protocol during bolus administration of intravenous contrast. CONTRAST:  11m OMNIPAQUE IOHEXOL 300 MG/ML  SOLN COMPARISON:  12/20/2019 FINDINGS: CT CHEST FINDINGS Cardiovascular: The heart size appears within normal limits. Mild aortic atherosclerosis. Mediastinum/Nodes: 1.3 cm low-density nodule is identified within left lobe of thyroid gland. The trachea appears patent and is midline. Normal appearance of the esophagus. No enlarged mediastinal or hilar lymph nodes. No axillary or supraclavicular adenopathy. Lungs/Pleura: No pleural effusion. Centrilobular emphysema. Calcified granuloma within the anterolateral right lower lobe. The treated lesion within the right lower lobe is again identified 1.6 x 1.4 cm, image 94/3. Previously 2.2 x 2.1 cm. No new lung nodules identified.  Musculoskeletal: No chest wall mass or suspicious bone lesions identified. CT ABDOMEN PELVIS FINDINGS Hepatobiliary: No focal liver abnormality is seen. No gallstones, gallbladder wall thickening, or biliary dilatation. Pancreas: Unremarkable. No pancreatic ductal dilatation or surrounding inflammatory changes. Spleen: Normal in size without focal abnormality. Adrenals/Urinary Tract: Right adrenal mass measures 2.3 x 1.9 cm, image 51/2. Previously 3.1 x 2.3 cm. The left adrenal lesion measures 2.5 x 1.4 cm, image 51/2. Previously 3.5 by 2.3 cm. Right kidney cyst. No suspicious kidney  mass or hydronephrosis. The urinary bladder is unremarkable. Stomach/Bowel: Stomach is within normal limits. Appendix appears normal. No evidence of bowel wall thickening, distention, or inflammatory changes. Vascular/Lymphatic: Aortic atherosclerosis. No aneurysm identified. No abdominopelvic adenopathy. Reproductive: Uterus and bilateral adnexa are unremarkable. Other: No free fluid or fluid collections. Musculoskeletal: No acute or significant osseous findings. IMPRESSION: 1. Interval response to therapy. The right lower lobe lung lesion demonstrates mild decrease in size. 2. Interval improvement in bilateral adrenal gland metastasis. 3. No new sites of disease. 4. Aortic Atherosclerosis (ICD10-I70.0) and Emphysema (ICD10-J43.9). Electronically Signed   By: Kerby Moors M.D.   On: 01/28/2020 14:24   CT Abdomen Pelvis W Contrast  Result Date: 01/28/2020 CLINICAL DATA:  Restaging lung cancer. EXAM: CT CHEST, ABDOMEN, AND PELVIS WITH CONTRAST TECHNIQUE: Multidetector CT imaging of the chest, abdomen and pelvis was performed following the standard protocol during bolus administration of intravenous contrast. CONTRAST:  197m OMNIPAQUE IOHEXOL 300 MG/ML  SOLN COMPARISON:  12/20/2019 FINDINGS: CT CHEST FINDINGS Cardiovascular: The heart size appears within normal limits. Mild aortic atherosclerosis. Mediastinum/Nodes: 1.3 cm low-density nodule is identified within left lobe of thyroid gland. The trachea appears patent and is midline. Normal appearance of the esophagus. No enlarged mediastinal or hilar lymph nodes. No axillary or supraclavicular adenopathy. Lungs/Pleura: No pleural effusion. Centrilobular emphysema. Calcified granuloma within the anterolateral right lower lobe. The treated lesion within the right lower lobe is again identified 1.6 x 1.4 cm, image 94/3. Previously 2.2 x 2.1 cm. No new lung nodules identified. Musculoskeletal: No chest wall mass or suspicious bone lesions identified. CT ABDOMEN  PELVIS FINDINGS Hepatobiliary: No focal liver abnormality is seen. No gallstones, gallbladder wall thickening, or biliary dilatation. Pancreas: Unremarkable. No pancreatic ductal dilatation or surrounding inflammatory changes. Spleen: Normal in size without focal abnormality. Adrenals/Urinary Tract: Right adrenal mass measures 2.3 x 1.9 cm, image 51/2. Previously 3.1 x 2.3 cm. The left adrenal lesion measures 2.5 x 1.4 cm, image 51/2. Previously 3.5 by 2.3 cm. Right kidney cyst. No suspicious kidney mass or hydronephrosis. The urinary bladder is unremarkable. Stomach/Bowel: Stomach is within normal limits. Appendix appears normal. No evidence of bowel wall thickening, distention, or inflammatory changes. Vascular/Lymphatic: Aortic atherosclerosis. No aneurysm identified. No abdominopelvic adenopathy. Reproductive: Uterus and bilateral adnexa are unremarkable. Other: No free fluid or fluid collections. Musculoskeletal: No acute or significant osseous findings. IMPRESSION: 1. Interval response to therapy. The right lower lobe lung lesion demonstrates mild decrease in size. 2. Interval improvement in bilateral adrenal gland metastasis. 3. No new sites of disease. 4. Aortic Atherosclerosis (ICD10-I70.0) and Emphysema (ICD10-J43.9). Electronically Signed   By: TKerby MoorsM.D.   On: 01/28/2020 14:24   PERIPHERAL VASCULAR CATHETERIZATION  Result Date: 02/01/2020 See op note  PERIPHERAL VASCULAR CATHETERIZATION  Result Date: 01/23/2020 See op note    Assessment and plan- Patient is a 59y.o. female withsquamous cell carcinoma of the lung stage IV [id number] with adrenal and brain metastases.    She is currently on maintenance Keytruda  and here for an acute visit for possible facial swelling  I noticed mild right-sided facial swelling on today's exam which does not appear to be significant.  No acute dental issues or sinus issues that would explain the swelling.  No dilated chest wall veins.  This is not  consistent with SVC syndrome.Also patient recently had a CT scan on 01/28/2020 which did not show any enlarged lymph nodes or masses that compressed SVC.  Clinically she does not appear fluid overloaded but patient states that post treatment she always feels bloated and feels like her legs swell up.  I have therefore given her a prescription for as needed Lasix 20 mg which she can use if she has weight gain or edema.  She will keep her appointment with me in 2 weeks time for her next dose of Keytruda as scheduled   Visit Diagnosis 1. Facial swelling      Dr. Randa Evens, MD, MPH [facility] [phone] 02/11/2020 9:24 AM

## 2020-02-12 ENCOUNTER — Inpatient Hospital Stay (HOSPITAL_BASED_OUTPATIENT_CLINIC_OR_DEPARTMENT_OTHER): Payer: Medicare Other | Admitting: Hospice and Palliative Medicine

## 2020-02-12 DIAGNOSIS — Z515 Encounter for palliative care: Secondary | ICD-10-CM

## 2020-02-12 NOTE — Progress Notes (Signed)
I was unable to reach patient by phone.  Will reschedule visit.

## 2020-02-14 ENCOUNTER — Other Ambulatory Visit: Payer: Self-pay | Admitting: Oncology

## 2020-02-15 ENCOUNTER — Other Ambulatory Visit: Payer: Self-pay | Admitting: *Deleted

## 2020-02-15 MED ORDER — OXYCODONE HCL 10 MG PO TABS: 10.0000 mg | ORAL_TABLET | ORAL | 0 refills | Status: DC | PRN

## 2020-02-22 ENCOUNTER — Ambulatory Visit (INDEPENDENT_AMBULATORY_CARE_PROVIDER_SITE_OTHER): Payer: Medicare Other | Admitting: Nurse Practitioner

## 2020-02-22 NOTE — Progress Notes (Deleted)
Called patient no answer left message  

## 2020-02-24 ENCOUNTER — Telehealth: Payer: Self-pay | Admitting: Oncology

## 2020-02-24 NOTE — Telephone Encounter (Signed)
Patient's sister called and reported that patient texted her earlier and told her that she had nausea, vomiting, diarrhea symptoms.  Sister is not currently with her.  She does not know if patient has taken her antiemetics or not.  She will plan to go to sister's house and check her. Patient has Compazine and Zofran on her medication list and my advice is to take home antiemetics, encourage oral hydration.  But if patient's symptom does not improve after antiemetics or if her symptom is very severe and cannot keep any food or drink down, I recommend patient go to emergency room for further assessment and management. Sister appreciates advised and will relay to patient.

## 2020-02-25 ENCOUNTER — Other Ambulatory Visit: Payer: Self-pay | Admitting: Oncology

## 2020-02-25 ENCOUNTER — Inpatient Hospital Stay (HOSPITAL_BASED_OUTPATIENT_CLINIC_OR_DEPARTMENT_OTHER): Payer: Medicare Other | Admitting: Oncology

## 2020-02-25 ENCOUNTER — Inpatient Hospital Stay: Payer: Medicare Other

## 2020-02-25 ENCOUNTER — Inpatient Hospital Stay: Payer: Medicare Other | Admitting: Oncology

## 2020-02-25 ENCOUNTER — Inpatient Hospital Stay: Payer: Medicare Other | Admitting: Hospice and Palliative Medicine

## 2020-02-25 ENCOUNTER — Other Ambulatory Visit: Payer: Self-pay

## 2020-02-25 ENCOUNTER — Encounter: Payer: Medicare Other | Admitting: Hospice and Palliative Medicine

## 2020-02-25 DIAGNOSIS — D649 Anemia, unspecified: Secondary | ICD-10-CM

## 2020-02-25 DIAGNOSIS — C3431 Malignant neoplasm of lower lobe, right bronchus or lung: Secondary | ICD-10-CM

## 2020-02-25 DIAGNOSIS — C7972 Secondary malignant neoplasm of left adrenal gland: Secondary | ICD-10-CM

## 2020-02-25 DIAGNOSIS — Z95828 Presence of other vascular implants and grafts: Secondary | ICD-10-CM

## 2020-02-25 DIAGNOSIS — C7971 Secondary malignant neoplasm of right adrenal gland: Secondary | ICD-10-CM

## 2020-02-25 DIAGNOSIS — Z5112 Encounter for antineoplastic immunotherapy: Secondary | ICD-10-CM | POA: Diagnosis not present

## 2020-02-25 LAB — COMPREHENSIVE METABOLIC PANEL
ALT: 15 U/L (ref 0–44)
AST: 17 U/L (ref 15–41)
Albumin: 3.7 g/dL (ref 3.5–5.0)
Alkaline Phosphatase: 100 U/L (ref 38–126)
Anion gap: 9 (ref 5–15)
BUN: 14 mg/dL (ref 6–20)
CO2: 25 mmol/L (ref 22–32)
Calcium: 9.2 mg/dL (ref 8.9–10.3)
Chloride: 105 mmol/L (ref 98–111)
Creatinine, Ser: 0.41 mg/dL — ABNORMAL LOW (ref 0.44–1.00)
GFR calc Af Amer: >60 mL/min (ref >60)
GFR calc non Af Amer: >60 mL/min (ref >60)
Glucose, Bld: 92 mg/dL (ref 70–99)
Potassium: 3.9 mmol/L (ref 3.5–5.1)
Sodium: 139 mmol/L (ref 135–145)
Total Bilirubin: 0.4 mg/dL (ref 0.3–1.2)
Total Protein: 6.9 g/dL (ref 6.5–8.1)

## 2020-02-25 LAB — CBC WITH DIFFERENTIAL/PLATELET
Abs Immature Granulocytes: 0 10*3/uL (ref 0.00–0.07)
Basophils Absolute: 0.1 10*3/uL (ref 0.0–0.1)
Basophils Relative: 1 %
Eosinophils Absolute: 0.2 10*3/uL (ref 0.0–0.5)
Eosinophils Relative: 4 %
HCT: 33.5 % — ABNORMAL LOW (ref 36.0–46.0)
Hemoglobin: 11 g/dL — ABNORMAL LOW (ref 12.0–15.0)
Immature Granulocytes: 0 %
Lymphocytes Relative: 17 %
Lymphs Abs: 0.8 10*3/uL (ref 0.7–4.0)
MCH: 33.2 pg (ref 26.0–34.0)
MCHC: 32.8 g/dL (ref 30.0–36.0)
MCV: 101.2 fL — ABNORMAL HIGH (ref 80.0–100.0)
Monocytes Absolute: 0.5 10*3/uL (ref 0.1–1.0)
Monocytes Relative: 10 %
Neutro Abs: 3.3 10*3/uL (ref 1.7–7.7)
Neutrophils Relative %: 68 %
Platelets: 267 10*3/uL (ref 150–400)
RBC: 3.31 MIL/uL — ABNORMAL LOW (ref 3.87–5.11)
RDW: 13.2 % (ref 11.5–15.5)
WBC: 4.8 10*3/uL (ref 4.0–10.5)
nRBC: 0 % (ref 0.0–0.2)

## 2020-02-25 MED ORDER — SODIUM CHLORIDE 0.9% FLUSH
10.0000 mL | Freq: Once | INTRAVENOUS | Status: AC
  Administered 2020-02-25: 10 mL via INTRAVENOUS
  Filled 2020-02-25: qty 10

## 2020-02-25 MED ORDER — HEPARIN SOD (PORK) LOCK FLUSH 100 UNIT/ML IV SOLN
500.0000 [IU] | Freq: Once | INTRAVENOUS | Status: AC
  Administered 2020-02-25: 500 [IU] via INTRAVENOUS
  Filled 2020-02-25: qty 5

## 2020-02-25 NOTE — Telephone Encounter (Signed)
Yes! We will call this morning.   Faythe Casa, NP 02/25/2020 9:03 AM

## 2020-02-25 NOTE — Progress Notes (Signed)
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  Patient followed by Dr. Janese Banks and Palliative care.    Met with patient in [facility] this pm as infusion was cancelled this am.  Patient reports that nausea and diarrhea has improved.  Reports that appetite is fairly good.  Reports that foods do not taste the same.  Typically eats chicken, yogurt, mashed potatoes, rice.  Drinks ensure and really likes the vanilla.  Has been drinking 1 per day because appetite has been good except acute episode of vomiting and diarrhea yesterday.     Medications: reviewed  Labs: reviewed  Anthropometrics:   Weight 137 lb on 3/5.  No weight checked today.     NUTRITION DIAGNOSIS: Inadequate oral intake stable    INTERVENTION:  Reviewed strategies to help with nausea.  Handout provided Patient declined more ensure enlive today.  Patient provided contact information    MONITORING, EVALUATION, GOAL: Patient will consume adequate calories and protein to maintain weight   NEXT VISIT: as needed  Geri Hepler B. Zenia Resides, [place], [place] Registered Dietitian [phone] (pager)

## 2020-02-25 NOTE — Telephone Encounter (Signed)
Can one of you please follow up with a phone call to begin with?

## 2020-02-26 ENCOUNTER — Inpatient Hospital Stay: Payer: Medicare Other

## 2020-02-26 VITALS — BP 123/71 | HR 73 | Temp 96.3°F | Resp 20 | Wt 137.0 lb

## 2020-02-26 DIAGNOSIS — Z5112 Encounter for antineoplastic immunotherapy: Secondary | ICD-10-CM | POA: Diagnosis not present

## 2020-02-26 DIAGNOSIS — C3431 Malignant neoplasm of lower lobe, right bronchus or lung: Secondary | ICD-10-CM

## 2020-02-26 MED ORDER — SODIUM CHLORIDE 0.9 % IV SOLN
200.0000 mg | Freq: Once | INTRAVENOUS | Status: AC
  Administered 2020-02-26: 200 mg via INTRAVENOUS
  Filled 2020-02-26: qty 8

## 2020-02-26 MED ORDER — SODIUM CHLORIDE 0.9% FLUSH
10.0000 mL | INTRAVENOUS | Status: DC | PRN
  Administered 2020-02-26: 09:00:00 10 mL
  Filled 2020-02-26: qty 10

## 2020-02-26 MED ORDER — SODIUM CHLORIDE 0.9 % IV SOLN
Freq: Once | INTRAVENOUS | Status: AC
  Filled 2020-02-26: qty 250

## 2020-02-26 MED ORDER — HEPARIN SOD (PORK) LOCK FLUSH 100 UNIT/ML IV SOLN
500.0000 [IU] | Freq: Once | INTRAVENOUS | Status: AC | PRN
  Administered 2020-02-26: 500 [IU]
  Filled 2020-02-26: qty 5

## 2020-02-27 ENCOUNTER — Other Ambulatory Visit: Payer: Self-pay | Admitting: Oncology

## 2020-02-28 ENCOUNTER — Encounter: Payer: Self-pay | Admitting: Oncology

## 2020-02-28 NOTE — Progress Notes (Signed)
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 She presented to Dr. Wilburn Cornelia symptoms of right flank pain which was radiating to her front which prompted a CT abdomen. CT abdomen pelvis with contrast showed a 3.7 x 2.8 x 5 cm right adrenal mass. 1.7 x 1.2 x 2.1 cm left adrenal nodule 1.8 cm right renal simple cyst. And a new 3.6 x 3.2 cm spiculated mass in the right lower lobe with tethering of the adjacent pleura. Prominent hilar lymph nodes. This was followed by a PET CT scan on 09/20/2019 which showed a 3.6 cm right lower lobe mass which was markedly hypermetabolic along with hypermetabolic metastatic disease in the right supraclavicular space as well as mediastinum and right hilum and hypermetabolic metastatic disease in bilateral adrenal glands. She will also noted to have bilateral hypermetabolic thyroid nodules and a possible nodule in the pelvis adjacent to the sigmoid colon.  Patient currently reports ongoing pain in her right flank which is relatively well controlled with hydrocodone. She also has occasional nausea for which she has as needed nausea medications. She is here with her twin sister today and is highly anxious Of note patient has a history of central core myopathy and follows up with neuromuscular clinic [facility]. Patient states that her twin  sister has had episodes of hyperthermia but patient herself has not had these episodes. I did review [facility] neuromuscular clinic note and the hyperthermia has been attributed to her history of hyperthyroidism and thyroid dysfunction rather than malignant hyperthermia. However given her history of myopathy patient does carry risk of malignant hyperthermia.  Not enough tissue was available for NGS testing. Patient therefore underwentLiquid biopsy which showed evidence of PI K3 CA amplification, RIC POR amplification, T p53  Patient developed significant scalp and body rash after cycle 1 of carbotaxol Keytruda. This resolved after a trial of steroids and Keytruda was held for cycle 2. Interim scans after 4 cycles showed response to treatment   Interval history-patient reports she ate some [place] and [place] food over the weekend.  Following that she had nausea on Monday morning.  Currently patient reports feeling better and feels that her nausea is going down  ECOG PS- 1 Pain scale- 0 Opioid associated constipation- no  Review of systems- Review of Systems  Constitutional: Positive for malaise/fatigue. Negative for chills, fever and weight loss.  HENT: Negative for congestion, ear discharge and nosebleeds.   Eyes: Negative for blurred vision.  Respiratory: Negative for cough, hemoptysis, sputum production, shortness of breath and wheezing.   Cardiovascular: Negative for chest pain, palpitations, orthopnea and claudication.  Gastrointestinal: Positive for nausea. Negative for abdominal pain, blood in stool, constipation, diarrhea, heartburn, melena and vomiting.  Genitourinary: Negative for dysuria, flank pain, frequency, hematuria and urgency.  Musculoskeletal: Negative for back pain, joint pain and myalgias.  Skin: Negative for rash.  Neurological: Negative for dizziness, tingling, focal weakness, seizures, weakness and headaches.  Endo/Heme/Allergies: Does not bruise/bleed easily.    Psychiatric/Behavioral: Negative for depression and suicidal ideas. The patient does not have insomnia.       Allergies  Allergen Reactions  . Paxil [Paroxetine Hcl] Other (See Comments)    Hallucinations   . Ciprofloxacin Other (See Comments)    Unknown  . Levaquin [Levofloxacin] Other (See Comments)    Unknown   . 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 She has an increase risk of malignant hyperthermia with anesthesia. She should not receive Propofol.   Donne Hazel [Nefazodone] Other (See Comments)    Unknown   . Biaxin [Clarithromycin] Nausea Only  . Erythromycin Nausea Only  . Penicillins Rash     Past Medical History:  Diagnosis Date  . Anxiety   . Arthritis   . Chronic kidney disease   . Family history of breast cancer    aunt  . GERD (gastroesophageal reflux disease)   . History of DVT of lower extremity    Left leg  . Lung cancer ([place])    with brain mets  . Lung mass   . Malignant hyperthermia   . 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 Muscular dystrophy ([place])   . Muscular dystrophy [facility])      Past Surgical History:  Procedure Laterality Date  . CESAREAN SECTION  11/1985  . COLONOSCOPY N/A 10/27/2015   Procedure: COLONOSCOPY;  Surgeon: Manya Silvas, MD;  Location: [facility] ENDOSCOPY;  Service: Endoscopy;  Laterality: N/A;   NO Propofol - per office  . LITHOTRIPSY    . PORTA CATH INSERTION N/A 11/09/2019   Procedure: PORTA CATH INSERTION;  Surgeon: Algernon Huxley, MD;  Location: [place] CV LAB;  Service: Cardiovascular;  Laterality: N/A;  . PORTA CATH INSERTION N/A 01/23/2020   Procedure: PORTA CATH INSERTION;  Surgeon: Algernon Huxley, MD;  Location: [place] CV LAB;  Service: Cardiovascular;  Laterality: N/A;  . PORTA CATH INSERTION N/A 02/01/2020   Procedure: PORTA CATH INSERTION;  Surgeon: Algernon Huxley, MD;  Location: [place] CV LAB;   Service: Cardiovascular;  Laterality: N/A;  . TUBAL LIGATION  03/1986    Social History   Socioeconomic History  . Marital status: Divorced    Spouse name: Not on file  . Number of children: Not on file  . Years of education: Not on file  . Highest education level: Not on file  Occupational History  . Not on file  Tobacco Use  . Smoking status: Former Smoker    Packs/day: 1.00    Types: Cigarettes    Quit date: 05/2016    Years since quitting: 3.8  . Smokeless tobacco: Never Used  Substance and Sexual Activity  . Alcohol use: No  . Drug use: No  . Sexual activity: Not Currently  Other Topics Concern  . Not on file  Social History Narrative  . Not on file   Social Determinants of Health   Financial Resource Strain:   . Difficulty of Paying Living Expenses:   Food Insecurity:   . Worried About Charity fundraiser in the Last Year:   . Arboriculturist in the Last Year:   Transportation Needs:   . Film/video editor (Medical):   Marland Kitchen Lack of Transportation (Non-Medical):   Physical Activity:   . Days of Exercise per Week:   .  Minutes of Exercise per Session:   Stress:   . Feeling of Stress :   Social Connections:   . Frequency of Communication with Friends and Family:   . Frequency of Social Gatherings with Friends and Family:   . Attends Religious Services:   . Active Member of Clubs or Organizations:   . Attends Archivist Meetings:   Marland Kitchen Marital Status:   Intimate Partner Violence:   . Fear of Current or Ex-Partner:   . Emotionally Abused:   Marland Kitchen Physically Abused:   . Sexually Abused:     Family History  Problem Relation Age of Onset  . Cancer Father        bladder, lung  . Breast cancer Maternal Aunt 45     Current Outpatient Medications:  .  acetaminophen (TYLENOL) 325 MG tablet, Take 650 mg by mouth every 6 (six) hours as needed., Disp: , Rfl:  .  ALPRAZolam (XANAX) 0.25 MG tablet, Take 0.25 mg by mouth at bedtime as needed for anxiety.,  Disp: , Rfl:  .  apixaban (ELIQUIS) 5 MG TABS tablet, Take 1 tablet (5 mg total) by mouth 2 (two) times daily., Disp: 60 tablet, Rfl: 2 .  azithromycin (ZITHROMAX) 250 MG tablet, Take 1 tablet (250 mg total) by mouth daily., Disp: 4 each, Rfl: 0 .  citalopram (CELEXA) 40 MG tablet, Take 1 tablet (40 mg total) by mouth daily., Disp: 30 tablet, Rfl: 3 .  fentaNYL (DURAGESIC) 12 MCG/HR, Place 1 patch onto the skin every 3 (three) days., Disp: 10 patch, Rfl: 0 .  fluticasone (FLONASE) 50 MCG/ACT nasal spray, Place 2 sprays into both nostrils daily as needed. , Disp: , Rfl:  .  furosemide (LASIX) 20 MG tablet, Take 1 tablet (20 mg total) by mouth as needed., Disp: 30 tablet, Rfl: 0 .  lidocaine-prilocaine (EMLA) cream, Apply to affected area once, Disp: 30 g, Rfl: 3 .  magic mouthwash SOLN, Take 5 mLs by mouth 3 (three) times daily as needed for mouth pain., Disp: 300 mL, Rfl: 1 .  methimazole (TAPAZOLE) 5 MG tablet, Take 5 mg by mouth daily., Disp: , Rfl:  .  nystatin (MYCOSTATIN) 100000 UNIT/ML suspension, Take 5 mLs (500,000 Units total) by mouth 4 (four) times daily. Swish and spit., Disp: 300 mL, Rfl: 0 .  OLANZapine (ZYPREXA) 10 MG tablet, TAKE ONE TABLET BY MOUTH AT BEDTIME, Disp: 30 tablet, Rfl: 1 .  ondansetron (ZOFRAN) 8 MG tablet, Take 1 tablet (8 mg total) by mouth 2 (two) times daily as needed for refractory nausea / vomiting. Start on day 3 after carboplatin chemo., Disp: 30 tablet, Rfl: 1 .  ondansetron (ZOFRAN-ODT) 8 MG disintegrating tablet, Take 8 mg by mouth 3 (three) times daily., Disp: , Rfl:  .  Oxycodone HCl 10 MG TABS, Take 1 tablet (10 mg total) by mouth every 4 (four) hours as needed., Disp: 60 tablet, Rfl: 0 .  pantoprazole (PROTONIX) 20 MG tablet, TAKE 1 TABLET BY MOUTH 2 TIMES DAILY, Disp: 120 tablet, Rfl: 0 .  prochlorperazine (COMPAZINE) 10 MG tablet, Take 1 tablet (10 mg total) by mouth every 6 (six) hours as needed (Nausea or vomiting)., Disp: 30 tablet, Rfl: 1 .   sucralfate (CARAFATE) 1 g tablet, Take 1 tablet (1 g total) by mouth 3 (three) times daily. Dissolve in 3-4 tbsp warm water, swish and swallow., Disp: 90 tablet, Rfl: 3 .  vitamin B-12 (CYANOCOBALAMIN) 1000 MCG tablet, Take 1 tablet (1,000 mcg total) by mouth daily.,  Disp: 30 tablet, Rfl: 1  Physical exam: Temperature 98.9, blood pressure 115/74.  Heart rate 65/min respiratory rate 18/min Physical Exam Constitutional:      General: She is not in acute distress. HENT:     Head: Normocephalic and atraumatic.  Eyes:     Pupils: Pupils are equal, round, and reactive to light.  Cardiovascular:     Rate and Rhythm: Normal rate and regular rhythm.     Heart sounds: Normal heart sounds.  Pulmonary:     Effort: Pulmonary effort is normal.     Breath sounds: Normal breath sounds.  Abdominal:     General: Bowel sounds are normal.     Palpations: Abdomen is soft.  Musculoskeletal:     Cervical back: Normal range of motion.  Skin:    General: Skin is warm and dry.  Neurological:     Mental Status: She is alert and oriented to person, place, and time.      CMP Latest Ref Rng & Units 02/25/2020  Glucose 70 - 99 mg/dL 92  BUN 6 - 20 mg/dL 14  Creatinine 0.44 - 1.00 mg/dL 0.41(L)  Sodium 135 - 145 mmol/L 139  Potassium 3.5 - 5.1 mmol/L 3.9  Chloride 98 - 111 mmol/L 105  CO2 22 - 32 mmol/L 25  Calcium 8.9 - 10.3 mg/dL 9.2  Total Protein 6.5 - 8.1 g/dL 6.9  Total Bilirubin 0.3 - 1.2 mg/dL 0.4  Alkaline Phos 38 - 126 U/L 100  AST 15 - 41 U/L 17  ALT 0 - 44 U/L 15   CBC Latest Ref Rng & Units 02/25/2020  WBC 4.0 - 10.5 K/uL 4.8  Hemoglobin 12.0 - 15.0 g/dL 11.0(L)  Hematocrit 36.0 - 46.0 % 33.5(L)  Platelets 150 - 400 K/uL 267    No images are attached to the encounter.  PERIPHERAL VASCULAR CATHETERIZATION  Result Date: 02/01/2020 See op note    Assessment and plan- Patient is a 59 y.o. female withsquamous cell carcinoma of the lung stage IV [id number] with adrenal and brain  metastases.   She is here for on treatment assessment prior to cycle 2 of maintenance Keytruda  Counts okay to proceed with cycle 2 of maintenance Keytruda tomorrow.  She arrived late for her treatment today and there is no room to give her Beryle Flock today.She is tolerating treatment well without any significant rash.  Baseline TSH is normal.  I will see her back in 3 weeks for cycle 3.  Normocytic anemia: Mild possibly secondary to malignancy and prior chemotherapy.  Continue to monitor  Nausea: Possibly secondary to food that she had over the weekend.  Currently her symptoms are much better.  She is taking Zyprexa as well which has been helping her with nausea   Visit Diagnosis 1. 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 Encounter for antineoplastic immunotherapy   3. Normocytic anemia      Dr. Randa Evens, MD, MPH [facility] at [facility] [phone] 02/28/2020 8:16 AM

## 2020-03-03 ENCOUNTER — Other Ambulatory Visit: Payer: Medicare Other

## 2020-03-03 ENCOUNTER — Ambulatory Visit: Payer: Medicare Other

## 2020-03-03 ENCOUNTER — Ambulatory Visit: Payer: Medicare Other | Admitting: Oncology

## 2020-03-06 ENCOUNTER — Other Ambulatory Visit: Payer: Self-pay | Admitting: *Deleted

## 2020-03-06 ENCOUNTER — Other Ambulatory Visit: Payer: Self-pay | Admitting: Oncology

## 2020-03-06 MED ORDER — OXYCODONE HCL 10 MG PO TABS: 10.0000 mg | ORAL_TABLET | ORAL | 0 refills | Status: DC | PRN

## 2020-03-07 ENCOUNTER — Other Ambulatory Visit: Payer: Self-pay | Admitting: Hospice and Palliative Medicine

## 2020-03-07 ENCOUNTER — Telehealth: Payer: Self-pay | Admitting: Adult Health Nurse Practitioner

## 2020-03-07 DIAGNOSIS — C3431 Malignant neoplasm of lower lobe, right bronchus or lung: Secondary | ICD-10-CM

## 2020-03-07 NOTE — Progress Notes (Signed)
Spoke with patient's sister.  Patient has been having increased drowsiness over the past several weeks.  She is sleeping most of the day.  Sister says one day she slept 21 hours.  No fever or chills.  No other symptomatic complaints.  Patient has chronically tolerated her opioids.  She remains on olanzapine 10 mg nightly.  I did recommend that sister half the dose this weekend.  We will plan to see her in [facility] early next week to check labs (a.m. cortisol, ACTH, TSH, CMP, CBC).  We will also consider MRI of the brain given history of brain mets.  Last MRI was 6 months ago.  Sister verbalized feeling overwhelmed with patient's care.  Had previously ordered social work through home health for consideration of establishing PCS services via Medicaid.  Unfortunately, I does not sound like that happened.  Will order social work through home-based palliative care to see if they can help with care coordination at home.  Case and plan discussed with Dr. Janese Banks.

## 2020-03-10 ENCOUNTER — Inpatient Hospital Stay: Payer: Medicare Other

## 2020-03-10 ENCOUNTER — Inpatient Hospital Stay: Payer: Medicare Other | Attending: Oncology

## 2020-03-10 ENCOUNTER — Other Ambulatory Visit: Payer: Self-pay

## 2020-03-10 ENCOUNTER — Telehealth: Payer: Self-pay | Admitting: Adult Health Nurse Practitioner

## 2020-03-10 ENCOUNTER — Inpatient Hospital Stay (HOSPITAL_BASED_OUTPATIENT_CLINIC_OR_DEPARTMENT_OTHER): Payer: Medicare Other | Admitting: Oncology

## 2020-03-10 VITALS — BP 112/64 | HR 65 | Temp 97.1°F | Resp 18

## 2020-03-10 DIAGNOSIS — E0520 Thyrotoxicosis with toxic multinodular goiter without thyrotoxic crisis or storm: Secondary | ICD-10-CM | POA: Insufficient documentation

## 2020-03-10 DIAGNOSIS — Z95828 Presence of other vascular implants and grafts: Secondary | ICD-10-CM

## 2020-03-10 DIAGNOSIS — Z5112 Encounter for antineoplastic immunotherapy: Secondary | ICD-10-CM | POA: Insufficient documentation

## 2020-03-10 DIAGNOSIS — Z7901 Long term (current) use of anticoagulants: Secondary | ICD-10-CM | POA: Insufficient documentation

## 2020-03-10 DIAGNOSIS — C7971 Secondary malignant neoplasm of right adrenal gland: Secondary | ICD-10-CM | POA: Diagnosis not present

## 2020-03-10 DIAGNOSIS — G893 Neoplasm related pain (acute) (chronic): Secondary | ICD-10-CM | POA: Insufficient documentation

## 2020-03-10 DIAGNOSIS — R5382 Chronic fatigue, unspecified: Secondary | ICD-10-CM | POA: Insufficient documentation

## 2020-03-10 DIAGNOSIS — C3431 Malignant neoplasm of lower lobe, right bronchus or lung: Secondary | ICD-10-CM | POA: Insufficient documentation

## 2020-03-10 DIAGNOSIS — Z79899 Other long term (current) drug therapy: Secondary | ICD-10-CM | POA: Insufficient documentation

## 2020-03-10 DIAGNOSIS — Z9221 Personal history of antineoplastic chemotherapy: Secondary | ICD-10-CM | POA: Insufficient documentation

## 2020-03-10 DIAGNOSIS — D649 Anemia, unspecified: Secondary | ICD-10-CM | POA: Insufficient documentation

## 2020-03-10 DIAGNOSIS — C7972 Secondary malignant neoplasm of left adrenal gland: Secondary | ICD-10-CM | POA: Insufficient documentation

## 2020-03-10 DIAGNOSIS — I4891 Unspecified atrial fibrillation: Secondary | ICD-10-CM | POA: Insufficient documentation

## 2020-03-10 DIAGNOSIS — R109 Unspecified abdominal pain: Secondary | ICD-10-CM

## 2020-03-10 LAB — COMPREHENSIVE METABOLIC PANEL
ALT: 16 U/L (ref 0–44)
AST: 22 U/L (ref 15–41)
Albumin: 3.3 g/dL — ABNORMAL LOW (ref 3.5–5.0)
Alkaline Phosphatase: 108 U/L (ref 38–126)
Anion gap: 8 (ref 5–15)
BUN: 11 mg/dL (ref 6–20)
CO2: 29 mmol/L (ref 22–32)
Calcium: 9.1 mg/dL (ref 8.9–10.3)
Chloride: 102 mmol/L (ref 98–111)
Creatinine, Ser: 0.46 mg/dL (ref 0.44–1.00)
GFR calc Af Amer: >60 mL/min (ref >60)
GFR calc non Af Amer: >60 mL/min (ref >60)
Glucose, Bld: 121 mg/dL — ABNORMAL HIGH (ref 70–99)
Potassium: 3.8 mmol/L (ref 3.5–5.1)
Sodium: 139 mmol/L (ref 135–145)
Total Bilirubin: 0.3 mg/dL (ref 0.3–1.2)
Total Protein: 7 g/dL (ref 6.5–8.1)

## 2020-03-10 LAB — CBC WITH DIFFERENTIAL/PLATELET
Abs Immature Granulocytes: 0.01 10*3/uL (ref 0.00–0.07)
Basophils Absolute: 0.1 10*3/uL (ref 0.0–0.1)
Basophils Relative: 1 %
Eosinophils Absolute: 0.4 10*3/uL (ref 0.0–0.5)
Eosinophils Relative: 8 %
HCT: 34.3 % — ABNORMAL LOW (ref 36.0–46.0)
Hemoglobin: 11.2 g/dL — ABNORMAL LOW (ref 12.0–15.0)
Immature Granulocytes: 0 %
Lymphocytes Relative: 12 %
Lymphs Abs: 0.7 10*3/uL (ref 0.7–4.0)
MCH: 32.9 pg (ref 26.0–34.0)
MCHC: 32.7 g/dL (ref 30.0–36.0)
MCV: 100.9 fL — ABNORMAL HIGH (ref 80.0–100.0)
Monocytes Absolute: 0.5 10*3/uL (ref 0.1–1.0)
Monocytes Relative: 9 %
Neutro Abs: 4 10*3/uL (ref 1.7–7.7)
Neutrophils Relative %: 70 %
Platelets: 242 10*3/uL (ref 150–400)
RBC: 3.4 MIL/uL — ABNORMAL LOW (ref 3.87–5.11)
RDW: 12.2 % (ref 11.5–15.5)
WBC: 5.7 10*3/uL (ref 4.0–10.5)
nRBC: 0 % (ref 0.0–0.2)

## 2020-03-10 LAB — CORTISOL: Cortisol, Plasma: 6.5 ug/dL

## 2020-03-10 MED ORDER — HEPARIN SOD (PORK) LOCK FLUSH 100 UNIT/ML IV SOLN
500.0000 [IU] | Freq: Once | INTRAVENOUS | Status: AC
  Administered 2020-03-10: 500 [IU] via INTRAVENOUS
  Filled 2020-03-10: qty 5

## 2020-03-10 MED ORDER — SODIUM CHLORIDE 0.9% FLUSH
10.0000 mL | Freq: Once | INTRAVENOUS | Status: AC
  Administered 2020-03-10: 10 mL via INTRAVENOUS
  Filled 2020-03-10: qty 10

## 2020-03-10 NOTE — Progress Notes (Signed)
Patient here today to be evaluated in the symptom management clinic for fatigue. Patient's sister states that she was sleeping a lot, but she talked to [employer] last week, and he told her to cut the olanzapine in half. Since she has done that, she is not sleeping as much and is feeling a bit better. She is complaining of back pain that is worse upon standing and has difficulty getting comfortable in the bed. She has mostly been sleeping in the recliner.

## 2020-03-10 NOTE — Progress Notes (Signed)
Symptom Management Consult note [facility]  [phone] [phone]  Patient Care Team: Tracie Harrier, MD as PCP - General (Internal Medicine) Telford Nab, RN as Registered Nurse Sindy Guadeloupe, MD as Consulting Physician (Hematology and Oncology)   Name of the patient: Deborah Nguyen  [id number]  01/02/1961   Date of visit: 03/10/2020   Diagnosis- Lung Cancer  Chief complaint/ Reason for visit- back pain  Heme/Onc history:  Oncology History  Malignant neoplasm of lower lobe of right lung ([place])  09/20/2019 Initial Diagnosis   Malignant neoplasm of lower lobe of right lung ([place])   10/05/2019 Cancer Staging   Staging form: Lung, AJCC 8th Edition - Clinical stage from 10/05/2019: Stage IV (cT2, cN2, cM1) - Signed by Sindy Guadeloupe, MD on 10/08/2019   11/12/2019 -  Chemotherapy   The patient had palonosetron (ALOXI) injection 0.25 mg, 0.25 mg, Intravenous,  Once, 4 of 4 cycles Administration: 0.25 mg (11/12/2019), 0.25 mg (12/24/2019), 0.25 mg (01/14/2020), 0.25 mg (12/03/2019) pegfilgrastim-cbqv (UDENYCA) injection 6 mg, 6 mg, Subcutaneous, Once, 4 of 4 cycles Administration: 6 mg (11/13/2019), 6 mg (12/26/2019), 6 mg (01/15/2020), 6 mg (12/04/2019) CARBOplatin (PARAPLATIN) 500 mg in sodium chloride 0.9 % 250 mL chemo infusion, 500 mg (100 % of original dose 500 mg), Intravenous,  Once, 4 of 4 cycles Dose modification:   (original dose 487.5 mg, Cycle 1), 500 mg (original dose 500 mg, Cycle 1, Reason: Patient Age, Comment: per md) Administration: 500 mg (12/24/2019), 500 mg (01/14/2020), 500 mg (12/03/2019), 500 mg (11/12/2019) PACLitaxel (TAXOL) 324 mg in sodium chloride 0.9 % 500 mL chemo infusion (> 80mg /m2), 200 mg/m2 = 324 mg, Intravenous,  Once, 4 of 4 cycles Dose modification: 175 mg/m2 (original dose 200 mg/m2, Cycle 3, Reason: Other (see comments), Comment: neuropathy) Administration: 324 mg (11/12/2019), 282 mg (12/24/2019), 282 mg  (01/14/2020), 324 mg (12/03/2019) pembrolizumab (KEYTRUDA) 200 mg in sodium chloride 0.9 % 50 mL chemo infusion, 200 mg, Intravenous, Once, 5 of 5 cycles Administration: 200 mg (11/12/2019), 200 mg (12/24/2019), 200 mg (01/14/2020), 200 mg (02/04/2020), 200 mg (02/26/2020) fosaprepitant (EMEND) 150 mg, dexamethasone (DECADRON) 12 mg in sodium chloride 0.9 % 145 mL IVPB, , Intravenous,  Once, 4 of 4 cycles Administration:  (11/12/2019),  (12/24/2019),  (01/14/2020),  (12/03/2019)  for chemotherapy treatment.     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  She completed 4 cycles of carbotaxol Keytruda with positive response noted on imaging from 01/28/2020.  She has completed 2 cycles of maintenance Keyruda since imaging last given on 02/26/2020.  Patient has tolerated treatments fairly well except for a rash with cycle 1 and intermittent nausea.  She was started on olanzapine approximately 2 weeks ago recently dose reduced secondary to drowsiness which appears to be improving nausea.  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    Today, she is very anxious and emotional.  She denies worsening lower back pain and states she thinks it is fairly "stable". Two weeks ago, she was able to walk a half mile around her apartment community using a rolling walker and now she is struggling to stand and complete ADL's.  She continues to take oxycodone 10 mg tablets every 4 hours for pain along with her 12 mcg/hour transdermal fentanyl patch.  She admits to feeling tired but does not believe she sleeps  more than usual.  She is drinking fluids well and eating "when she felt like it".  She is eating 1 meal daily with supplements as tolerated.  She is wanting to get strong enough to be allowed to watch her grandchildren on her own.  Her nausea is completely resolved since beginning olanzipine and she  denies any vomiting.  Bowel movements are loose. She denies any shortness of breath, chest pain, cough, constipation or urinary concerns.  She has bowel movements daily and patient notes a softer consistency recently.  She takes MiraLAX twice daily.  ECOG FS:2 - Symptomatic, <50% confined to bed  Review of systems- Review of Systems  Constitutional: Positive for malaise/fatigue. Negative for chills, fever and weight loss.  HENT: Negative for congestion, ear pain and tinnitus.   Eyes: Negative.  Negative for blurred vision and double vision.  Respiratory: Negative.  Negative for cough, sputum production and shortness of breath.   Cardiovascular: Negative.  Negative for chest pain, palpitations and leg swelling.  Gastrointestinal: Negative.  Negative for abdominal pain, constipation, diarrhea, nausea and vomiting.       Loose stools  Genitourinary: Negative for dysuria, frequency and urgency.  Musculoskeletal: Positive for back pain. Negative for falls.  Skin: Negative.  Negative for rash.  Neurological: Positive for weakness. Negative for headaches.  Endo/Heme/Allergies: Negative.  Does not bruise/bleed easily.  Psychiatric/Behavioral: Positive for depression. The patient is nervous/anxious. The patient does not have insomnia.      Current treatment-carbotaxol Keytruda x4 cycles.  Currently on surveillance Keytruda x2 cycles.  Allergies  Allergen Reactions  . Paxil [Paroxetine Hcl] Other (See Comments)    Hallucinations   . Ciprofloxacin Other (See Comments)    Unknown  . Levaquin [Levofloxacin] Other (See Comments)    Unknown   . 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 She has an increase risk of malignant hyperthermia with anesthesia. She should not receive Propofol.   Donne Hazel [Nefazodone] Other (See Comments)    Unknown   . Biaxin [Clarithromycin] Nausea Only  . Erythromycin Nausea Only  . Penicillins Rash      Past Medical History:  Diagnosis Date  . Anxiety   . Arthritis   . Chronic kidney disease   . Family history of breast cancer    aunt  . GERD (gastroesophageal reflux disease)   . History of DVT of lower extremity    Left leg  . Lung cancer ([place])    with brain mets  . Lung mass   . Malignant hyperthermia   . 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 Muscular dystrophy ([place])   . Muscular dystrophy [facility])      Past Surgical History:  Procedure Laterality Date  . CESAREAN SECTION  11/1985  . COLONOSCOPY N/A 10/27/2015   Procedure: COLONOSCOPY;  Surgeon: Manya Silvas, MD;  Location: [facility] ENDOSCOPY;  Service: Endoscopy;  Laterality: N/A;   NO Propofol - per office  . LITHOTRIPSY    . PORTA CATH INSERTION N/A 11/09/2019   Procedure: PORTA CATH INSERTION;  Surgeon: Algernon Huxley, MD;  Location: [facility] CV LAB;  Service: Cardiovascular;  Laterality: N/A;  . PORTA CATH INSERTION N/A 01/23/2020   Procedure: PORTA CATH INSERTION;  Surgeon: Algernon Huxley, MD;  Location: [place] CV LAB;  Service: Cardiovascular;  Laterality: N/A;  . PORTA CATH INSERTION N/A 02/01/2020  Procedure: PORTA CATH INSERTION;  Surgeon: Algernon Huxley, MD;  Location: [place] CV LAB;  Service: Cardiovascular;  Laterality: N/A;  . TUBAL LIGATION  03/1986    Social History   Socioeconomic History  . Marital status: Divorced    Spouse name: Not on file  . Number of children: Not on file  . Years of education: Not on file  . Highest education level: Not on file  Occupational History  . Not on file  Tobacco Use  . Smoking status: Former Smoker    Packs/day: 1.00    Types: Cigarettes    Quit date: 05/2016    Years since quitting: 3.8  . Smokeless tobacco: Never Used  Substance and Sexual Activity  . Alcohol use: No  . Drug use: No  . Sexual activity: Not Currently  Other Topics Concern  . Not on file  Social History Narrative  . Not on file    Social Determinants of Health   Financial Resource Strain:   . Difficulty of Paying Living Expenses:   Food Insecurity:   . Worried About Charity fundraiser in the Last Year:   . Arboriculturist in the Last Year:   Transportation Needs:   . Film/video editor (Medical):   Marland Kitchen Lack of Transportation (Non-Medical):   Physical Activity:   . Days of Exercise per Week:   . Minutes of Exercise per Session:   Stress:   . Feeling of Stress :   Social Connections:   . Frequency of Communication with Friends and Family:   . Frequency of Social Gatherings with Friends and Family:   . Attends Religious Services:   . Active Member of Clubs or Organizations:   . Attends Archivist Meetings:   Marland Kitchen Marital Status:   Intimate Partner Violence:   . Fear of Current or Ex-Partner:   . Emotionally Abused:   Marland Kitchen Physically Abused:   . Sexually Abused:     Family History  Problem Relation Age of Onset  . Cancer Father        bladder, lung  . Breast cancer Maternal Aunt 45     Current Outpatient Medications:  .  acetaminophen (TYLENOL) 325 MG tablet, Take 650 mg by mouth every 6 (six) hours as needed., Disp: , Rfl:  .  ALPRAZolam (XANAX) 0.25 MG tablet, Take 0.25 mg by mouth at bedtime as needed for anxiety., Disp: , Rfl:  .  apixaban (ELIQUIS) 5 MG TABS tablet, Take 1 tablet (5 mg total) by mouth 2 (two) times daily., Disp: 60 tablet, Rfl: 2 .  azithromycin (ZITHROMAX) 250 MG tablet, Take 1 tablet (250 mg total) by mouth daily., Disp: 4 each, Rfl: 0 .  citalopram (CELEXA) 40 MG tablet, Take 1 tablet (40 mg total) by mouth daily., Disp: 30 tablet, Rfl: 3 .  fentaNYL (DURAGESIC) 12 MCG/HR, PLACE 1 PATCH ONTO THE SKIN EVERY 3 DAYS, Disp: 5 patch, Rfl: 0 .  fluticasone (FLONASE) 50 MCG/ACT nasal spray, Place 2 sprays into both nostrils daily as needed. , Disp: , Rfl:  .  furosemide (LASIX) 20 MG tablet, Take 1 tablet (20 mg total) by mouth as needed., Disp: 30 tablet, Rfl: 0 .   lidocaine-prilocaine (EMLA) cream, Apply to affected area once, Disp: 30 g, Rfl: 3 .  magic mouthwash SOLN, Take 5 mLs by mouth 3 (three) times daily as needed for mouth pain., Disp: 300 mL, Rfl: 1 .  methimazole (TAPAZOLE) 5 MG tablet, Take 5  mg by mouth daily., Disp: , Rfl:  .  nystatin (MYCOSTATIN) 100000 UNIT/ML suspension, Take 5 mLs (500,000 Units total) by mouth 4 (four) times daily. Swish and spit., Disp: 300 mL, Rfl: 0 .  OLANZapine (ZYPREXA) 10 MG tablet, TAKE ONE TABLET BY MOUTH AT BEDTIME (Patient taking differently: 5 mg. ), Disp: 30 tablet, Rfl: 1 .  ondansetron (ZOFRAN) 8 MG tablet, Take 1 tablet (8 mg total) by mouth 2 (two) times daily as needed for refractory nausea / vomiting. Start on day 3 after carboplatin chemo., Disp: 30 tablet, Rfl: 1 .  ondansetron (ZOFRAN-ODT) 8 MG disintegrating tablet, Take 8 mg by mouth 3 (three) times daily., Disp: , Rfl:  .  Oxycodone HCl 10 MG TABS, Take 1 tablet (10 mg total) by mouth every 4 (four) hours as needed., Disp: 60 tablet, Rfl: 0 .  pantoprazole (PROTONIX) 20 MG tablet, TAKE 1 TABLET BY MOUTH 2 TIMES DAILY, Disp: 120 tablet, Rfl: 0 .  prochlorperazine (COMPAZINE) 10 MG tablet, Take 1 tablet (10 mg total) by mouth every 6 (six) hours as needed (Nausea or vomiting)., Disp: 30 tablet, Rfl: 1 .  sucralfate (CARAFATE) 1 g tablet, Take 1 tablet (1 g total) by mouth 3 (three) times daily. Dissolve in 3-4 tbsp warm water, swish and swallow., Disp: 90 tablet, Rfl: 3 .  vitamin B-12 (CYANOCOBALAMIN) 1000 MCG tablet, Take 1 tablet (1,000 mcg total) by mouth daily., Disp: 30 tablet, Rfl: 1  Physical exam:  Vitals:   03/10/20 0920  BP: 112/64  Pulse: 65  Resp: 18  Temp: (!) 97.1 F (36.2 C)  TempSrc: Tympanic   Physical Exam Constitutional:      Appearance: Normal appearance.     Comments: Sitting in a wheelchair  HENT:     Head: Normocephalic and atraumatic.  Eyes:     Pupils: Pupils are equal, round, and reactive to light.    Cardiovascular:     Rate and Rhythm: Normal rate and regular rhythm.     Heart sounds: Normal heart sounds. No murmur.  Pulmonary:     Effort: Pulmonary effort is normal.     Breath sounds: Normal breath sounds. No wheezing.  Abdominal:     General: Bowel sounds are normal. There is no distension.     Palpations: Abdomen is soft.     Tenderness: There is no abdominal tenderness.  Musculoskeletal:        General: Normal range of motion.     Cervical back: Normal range of motion.  Skin:    General: Skin is warm and dry.     Findings: No rash.  Neurological:     Mental Status: She is alert and oriented to person, place, and time.  Psychiatric:        Judgment: Judgment normal.      CMP Latest Ref Rng & Units 03/10/2020  Glucose 70 - 99 mg/dL 121(H)  BUN 6 - 20 mg/dL 11  Creatinine 0.44 - 1.00 mg/dL 0.46  Sodium 135 - 145 mmol/L 139  Potassium 3.5 - 5.1 mmol/L 3.8  Chloride 98 - 111 mmol/L 102  CO2 22 - 32 mmol/L 29  Calcium 8.9 - 10.3 mg/dL 9.1  Total Protein 6.5 - 8.1 g/dL 7.0  Total Bilirubin 0.3 - 1.2 mg/dL 0.3  Alkaline Phos 38 - 126 U/L 108  AST 15 - 41 U/L 22  ALT 0 - 44 U/L 16   CBC Latest Ref Rng & Units 03/10/2020  WBC 4.0 - 10.5 K/uL 5.7  Hemoglobin 12.0 - 15.0 g/dL 11.2(L)  Hematocrit 36.0 - 46.0 % 34.3(L)  Platelets 150 - 400 K/uL 242    No images are attached to the encounter.  No results found.  Assessment and plan- Patient is a 59 y.o. female who presents with above history to symptom management for concerns of drowsiness and worsening lower back pain.  Palliative care NP, Merrily Pew Borders had a televisit with her on Friday to evaluate for drowsiness believed to be related to 10 mg olanzapine for persistent nausea.  Her olanzapine was cut in half and symptoms have improved over the weekend. Each day she has noted improvement with her drowsiness and has noted more time awake.  We will hold off on imaging of brain at this time.  Patient notes slightly worsening  lower back pain with position changes, but overall is stable. I did review previous imaging from CT abdomen pelvis from 01/28/2020 which showed improvement of disease and no muscular skeletal concerns.  Patient states she went for a long walk approximately 2 weeks around the same time symptoms began.  Given imaging showed good response to treatment this likely not malignancy related. Likely multifactorial including known Muscular dystophy and possible strain with overuse.  Recommend heating pad several times daily and continuation of narcotics.  If no improvement noted, will get imaging.  We will also collect a urine sample just to rule out UTI.  Continue dose reduced olanzapine given drowsiness has improved and nausea is stable.  We will call patient with results of urinalysis.  Disposition: RTC on 03/18/2020 for consideration of cycle 3 maintenance Keytruda.   Visit Diagnosis 1. Malignant neoplasm of lower lobe of right lung [facility])     Patient expressed understanding and was in agreement with this plan. She also understands that She can call clinic at any time with any questions, concerns, or complaints.   Greater than 50% was spent in counseling and coordination of care with this patient including but not limited to discussion of the relevant topics above (See A&P) including, but not limited to diagnosis and management of acute and chronic medical conditions.   Thank you for allowing me to participate in the care of this very pleasant patient.    Jacquelin Hawking, NP [place] [facility] Cell - [phone] Pager- [phone] 03/10/2020 11:40 AM  CC: Dr. Janese Banks

## 2020-03-11 ENCOUNTER — Inpatient Hospital Stay: Payer: Medicare Other

## 2020-03-11 ENCOUNTER — Telehealth: Payer: Self-pay | Admitting: Hospice and Palliative Medicine

## 2020-03-11 ENCOUNTER — Encounter: Payer: Medicare Other | Admitting: Oncology

## 2020-03-11 ENCOUNTER — Telehealth: Payer: Self-pay | Admitting: *Deleted

## 2020-03-11 DIAGNOSIS — C7931 Secondary malignant neoplasm of brain: Secondary | ICD-10-CM

## 2020-03-11 DIAGNOSIS — C3490 Malignant neoplasm of unspecified part of unspecified bronchus or lung: Secondary | ICD-10-CM

## 2020-03-11 LAB — THYROID PANEL WITH TSH
Free Thyroxine Index: 1.5 (ref 1.2–4.9)
T3 Uptake Ratio: 23 % — ABNORMAL LOW (ref 24–39)
T4, Total: 6.4 ug/dL (ref 4.5–12.0)
TSH: 2.86 u[IU]/mL (ref 0.450–4.500)

## 2020-03-11 LAB — ACTH: C206 ACTH: 27.7 pg/mL (ref 7.2–63.3)

## 2020-03-11 NOTE — Telephone Encounter (Signed)
Pt schedule for brain MRI on 4/20 at 10am. Pt's sister has been made aware.

## 2020-03-11 NOTE — Telephone Encounter (Signed)
Pt's sister called to ask when pt will need to have her brain MRI repeated. Last brain MRI was Oct 2020. Pt's sister remembered it being mentioned at previous visits but has not been scheduled yet.   When would you like for pt to have repeat brain MRI?

## 2020-03-11 NOTE — Progress Notes (Signed)
Pharmacist Chemotherapy Monitoring - Follow Up Assessment    I verify that I have reviewed each item in the below checklist:  . Regimen for the patient is scheduled for the appropriate day and plan matches scheduled date. Marland Kitchen Appropriate non-routine labs are ordered dependent on drug ordered. . If applicable, additional medications reviewed and ordered per protocol based on lifetime cumulative doses and/or treatment regimen.   Plan for follow-up and/or issues identified: No . I-vent associated with next due treatment: No . MD and/or nursing notified: No  Jeanetta Alonzo K 03/11/2020 9:07 AM

## 2020-03-11 NOTE — Telephone Encounter (Signed)
yes

## 2020-03-11 NOTE — Telephone Encounter (Signed)
I spoke with Loree Fee, LCSW for [facility] regarding patient's home needs.

## 2020-03-17 ENCOUNTER — Other Ambulatory Visit: Payer: Self-pay | Admitting: Oncology

## 2020-03-17 NOTE — Progress Notes (Signed)
Patient is coming in for follow up

## 2020-03-18 ENCOUNTER — Inpatient Hospital Stay: Payer: Medicare Other

## 2020-03-18 ENCOUNTER — Other Ambulatory Visit: Payer: Self-pay

## 2020-03-18 ENCOUNTER — Inpatient Hospital Stay (HOSPITAL_BASED_OUTPATIENT_CLINIC_OR_DEPARTMENT_OTHER): Payer: Medicare Other | Admitting: Oncology

## 2020-03-18 VITALS — BP 112/64 | HR 62 | Temp 98.1°F | Ht 61.0 in | Wt 133.0 lb

## 2020-03-18 DIAGNOSIS — C3431 Malignant neoplasm of lower lobe, right bronchus or lung: Secondary | ICD-10-CM

## 2020-03-18 DIAGNOSIS — R5383 Other fatigue: Secondary | ICD-10-CM

## 2020-03-18 DIAGNOSIS — C7972 Secondary malignant neoplasm of left adrenal gland: Secondary | ICD-10-CM

## 2020-03-18 DIAGNOSIS — Z95828 Presence of other vascular implants and grafts: Secondary | ICD-10-CM

## 2020-03-18 DIAGNOSIS — Z5112 Encounter for antineoplastic immunotherapy: Secondary | ICD-10-CM | POA: Diagnosis not present

## 2020-03-18 DIAGNOSIS — G893 Neoplasm related pain (acute) (chronic): Secondary | ICD-10-CM

## 2020-03-18 DIAGNOSIS — C7971 Secondary malignant neoplasm of right adrenal gland: Secondary | ICD-10-CM

## 2020-03-18 DIAGNOSIS — D649 Anemia, unspecified: Secondary | ICD-10-CM

## 2020-03-18 LAB — COMPREHENSIVE METABOLIC PANEL
ALT: 22 U/L (ref 0–44)
AST: 24 U/L (ref 15–41)
Albumin: 3.4 g/dL — ABNORMAL LOW (ref 3.5–5.0)
Alkaline Phosphatase: 105 U/L (ref 38–126)
Anion gap: 11 (ref 5–15)
BUN: 13 mg/dL (ref 6–20)
CO2: 28 mmol/L (ref 22–32)
Calcium: 9.4 mg/dL (ref 8.9–10.3)
Chloride: 100 mmol/L (ref 98–111)
Creatinine, Ser: 0.4 mg/dL — ABNORMAL LOW (ref 0.44–1.00)
GFR calc Af Amer: >60 mL/min (ref >60)
GFR calc non Af Amer: >60 mL/min (ref >60)
Glucose, Bld: 91 mg/dL (ref 70–99)
Potassium: 4 mmol/L (ref 3.5–5.1)
Sodium: 139 mmol/L (ref 135–145)
Total Bilirubin: 0.3 mg/dL (ref 0.3–1.2)
Total Protein: 7.1 g/dL (ref 6.5–8.1)

## 2020-03-18 LAB — CBC WITH DIFFERENTIAL/PLATELET
Abs Immature Granulocytes: 0.01 10*3/uL (ref 0.00–0.07)
Basophils Absolute: 0 10*3/uL (ref 0.0–0.1)
Basophils Relative: 1 %
Eosinophils Absolute: 0.5 10*3/uL (ref 0.0–0.5)
Eosinophils Relative: 8 %
HCT: 33.6 % — ABNORMAL LOW (ref 36.0–46.0)
Hemoglobin: 11 g/dL — ABNORMAL LOW (ref 12.0–15.0)
Immature Granulocytes: 0 %
Lymphocytes Relative: 10 %
Lymphs Abs: 0.7 10*3/uL (ref 0.7–4.0)
MCH: 32.3 pg (ref 26.0–34.0)
MCHC: 32.7 g/dL (ref 30.0–36.0)
MCV: 98.5 fL (ref 80.0–100.0)
Monocytes Absolute: 0.7 10*3/uL (ref 0.1–1.0)
Monocytes Relative: 10 %
Neutro Abs: 4.8 10*3/uL (ref 1.7–7.7)
Neutrophils Relative %: 71 %
Platelets: 264 10*3/uL (ref 150–400)
RBC: 3.41 MIL/uL — ABNORMAL LOW (ref 3.87–5.11)
RDW: 11.7 % (ref 11.5–15.5)
WBC: 6.7 10*3/uL (ref 4.0–10.5)
nRBC: 0 % (ref 0.0–0.2)

## 2020-03-18 MED ORDER — SODIUM CHLORIDE 0.9% FLUSH
10.0000 mL | Freq: Once | INTRAVENOUS | Status: AC
  Administered 2020-03-18: 10 mL via INTRAVENOUS
  Filled 2020-03-18: qty 10

## 2020-03-18 MED ORDER — HEPARIN SOD (PORK) LOCK FLUSH 100 UNIT/ML IV SOLN
INTRAVENOUS | Status: AC
  Filled 2020-03-18: qty 5

## 2020-03-18 MED ORDER — SODIUM CHLORIDE 0.9 % IV SOLN
Freq: Once | INTRAVENOUS | Status: AC
  Filled 2020-03-18: qty 250

## 2020-03-18 MED ORDER — SODIUM CHLORIDE 0.9 % IV SOLN
200.0000 mg | Freq: Once | INTRAVENOUS | Status: AC
  Administered 2020-03-18: 200 mg via INTRAVENOUS
  Filled 2020-03-18: qty 8

## 2020-03-18 MED ORDER — HEPARIN SOD (PORK) LOCK FLUSH 100 UNIT/ML IV SOLN
500.0000 [IU] | Freq: Once | INTRAVENOUS | Status: AC | PRN
  Administered 2020-03-18: 500 [IU]
  Filled 2020-03-18: qty 5

## 2020-03-18 NOTE — Progress Notes (Signed)
Patient stated that she had been having back pain.

## 2020-03-20 ENCOUNTER — Other Ambulatory Visit: Payer: Self-pay

## 2020-03-20 ENCOUNTER — Other Ambulatory Visit: Payer: Medicare Other | Admitting: Adult Health Nurse Practitioner

## 2020-03-20 DIAGNOSIS — Z515 Encounter for palliative care: Secondary | ICD-10-CM

## 2020-03-20 DIAGNOSIS — C3431 Malignant neoplasm of lower lobe, right bronchus or lung: Secondary | ICD-10-CM

## 2020-03-20 NOTE — Progress Notes (Signed)
[facility] Consult Note Telephone: [phone]  Fax: [phone]  PATIENT NAME: Deborah Nguyen DOB: October 05, 1961 MRN: [id number]  PRIMARY CARE PROVIDER:   Tracie Harrier, MD  REFERRING PROVIDER:  Tracie Harrier, MD [address] [place],  [place] [address]  RESPONSIBLE PARTY:     ASSESSMENT:        RECOMMENDATIONS and PLAN:  1.  Advanced care planning.  Patient is a DNR.  Has MOST form filled out and is in the home.  MOST indicates DNR, limited hospital interventions, antibiotics determine use for infection, IV fluids as indicated, no feeding tube.  Copy of MOST form is in Epic.    2.  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  Patient underwent radiation October 2020 and chemo started in December 2020.  She has finished chemo and is now on immunotherapy with Keytruda.  States that unsure if radiation or chemo caused her skin to burn on her scalp, forehead, ears, and back of neck.  This is healing.  States that prior to any treatment she did require a walker.  Starting in January 2021 she started using a walker as she gets off balance and states getting shaky at times.  She does not have any shakiness today.  She does have nausea related to treatment and gets relief with olanzapine 77m half a tab (529m at night and she states taking something for nausea every 6 hours but unsure the name of it.  She has compazine 10 mg every 6 hours on her med list.  She also has zofran 78m40mvery 8 hours on her med list as well.  This regimen for nausea seems to be giving her good relief and she is able to eat and keep her weight stable.  She states that when first starting therapy she lost weight because she couldn't eat much due to mouth sores.  States she no longer has the mouth sores and eats without difficulty.  Current weight is 133 pounds.  Her sister has to help her get in the shower and wash her back and feet.  She  needs help dressing.  She goes to bathroom by herself.  She requires help to clean her home and can do light meal prep but does get tired easily.  Encouraged to get help and conserve her energy.  States that home health therapy met with her 2 times and then discharged her.  She is being followed by Dr. RaoJanese Banksr her lung cancer.  Continue follow up and recommendations by Dr. RaoJanese Banks  3.  Pain.  Patient has chronic low back pain.  Imaging has been done with no known cause for the pain.  The patient has muscular dystrophy and believed this may be causing her back pain.  She gets relief with Fentanyl 40m36mr patch and oxycodone 10 mg every 4 hours. Patient states that yesterday she did not feel pain and was able to walk without her walker.  Patient states having pain today.  Her fentanyl patch was supposed to be changed on Tuesday.  Patient has told her sister on Tuesday to leave and patient did not change her patch as her sister usually helps her with this.  Provider changed her patch today and updated the check off list her sister leaves in the cabinet with her meds.  Patient needs supervision to properly take meds, see below.  Continue current pain regimen.    4.  Support.  Her sister is her primary caregiver.  Spoke with her sister, Deborah Nguyen, via phone.  Deborah Nguyen states that she has been staying with her sister for the past 7 months but at times needs to go to her home and be with her family.  Has tried to get other family members to help with patient's care needs and supervision without much luck.  Deborah Nguyen feels nervous leaving her sister alone when she needs to leave and take care of her family.  Deborah Nguyen is getting worn out taking care of her sister and the mental toll of worrying about her safety when she can't be there is overwhelming. Patient relies on Deborah Nguyen to help with her meds as indicated by her not keeping up when to change her fentanyl patch despite her sister placing reminders. Discussed getting help in the home.   Patient does have Medicaid and we discussed personal care services that can be provided through [facility].  Reached out to SW and she forwarded form that can be sent to PCP to be filled out.  Will have this sent to her PCP's office.  Palliative will continue to monitor for symptom management/decline and make recommendations as needed.  Will follow up in 4 weeks  I spent 120 minutes providing this consultation,  from 11:00 to 1:00 including time spent with patient/family, chart review, provider coordination, documentation. More than 50% of the time in this consultation was spent coordinating communication.   HISTORY OF PRESENT ILLNESS:  Deborah Nguyen is a 59 y.o. 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 Palliative Care was asked to help address goals of care.   CODE STATUS: DNR  PPS: 40% HOSPICE ELIGIBILITY/DIAGNOSIS: TBD  PHYSICAL EXAM:   General: NAD, frail appearing Extremities: no edema, no joint deformities Skin: no rashes on exposed skin Neurological: Weakness; does have some forgetfulness and difficulty with word finding   PAST MEDICAL HISTORY:  Past Medical History:  Diagnosis Date  . Anxiety   . Arthritis   . Chronic kidney disease   . Family history of breast cancer    aunt  . GERD (gastroesophageal reflux disease)   . History of DVT of lower extremity    Left leg  . Lung cancer ([name])    with brain mets  . Lung mass   . Malignant hyperthermia   . 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 Muscular dystrophy ([name])   . Muscular dystrophy ([name])     SOCIAL HX:  Social History   Tobacco Use  . Smoking status: Former Smoker    Packs/day: 1.00    Types: Cigarettes    Quit date: 05/2016    Years since quitting: 3.8  . Smokeless tobacco: Never Used  Substance Use Topics  . Alcohol use: No    ALLERGIES:  Allergies  Allergen  Reactions  . Paxil [Paroxetine Hcl] Other (See Comments)    Hallucinations   . Ciprofloxacin Other (See Comments)    Unknown  . Levaquin [Levofloxacin] Other (See Comments)    Unknown   . Morphine And Related Other (See Comments)  . 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 She has an increase risk of malignant hyperthermia with anesthesia. She should not receive Propofol.   Donne Hazel [Nefazodone] Other (See Comments)    Unknown   . Biaxin [  Clarithromycin] Nausea Only  . Erythromycin Nausea Only  . Penicillins Rash     PERTINENT MEDICATIONS:  Outpatient Encounter Medications as of 03/20/2020  Medication Sig  . acetaminophen (TYLENOL) 325 MG tablet Take 650 mg by mouth every 6 (six) hours as needed.  . ALPRAZolam (XANAX) 0.25 MG tablet Take 0.25 mg by mouth at bedtime as needed for anxiety.  Marland Kitchen azithromycin (ZITHROMAX) 250 MG tablet Take 1 tablet (250 mg total) by mouth daily.  . citalopram (CELEXA) 40 MG tablet Take 1 tablet (40 mg total) by mouth daily.  Marland Kitchen ELIQUIS 5 MG TABS tablet TAKE 1 TABLET BY MOUTH TWICE A DAY  . fentaNYL (DURAGESIC) 12 MCG/HR PLACE 1 PATCH ONTO THE SKIN EVERY 3 DAYS  . fluticasone (FLONASE) 50 MCG/ACT nasal spray Place 2 sprays into both nostrils daily as needed.   . furosemide (LASIX) 20 MG tablet Take 1 tablet (20 mg total) by mouth as needed.  . lidocaine-prilocaine (EMLA) cream Apply to affected area once  . magic mouthwash SOLN Take 5 mLs by mouth 3 (three) times daily as needed for mouth pain.  . methimazole (TAPAZOLE) 5 MG tablet Take 5 mg by mouth daily.  Marland Kitchen nystatin (MYCOSTATIN) 100000 UNIT/ML suspension Take 5 mLs (500,000 Units total) by mouth 4 (four) times daily. Swish and spit.  Marland Kitchen OLANZapine (ZYPREXA) 10 MG tablet TAKE ONE TABLET BY MOUTH AT BEDTIME (Patient taking differently: 5 mg. )  . ondansetron (ZOFRAN) 8 MG tablet Take 1 tablet (8 mg total) by mouth 2 (two) times daily as needed for  refractory nausea / vomiting. Start on day 3 after carboplatin chemo.  . ondansetron (ZOFRAN-ODT) 8 MG disintegrating tablet Take 8 mg by mouth 3 (three) times daily.  . Oxycodone HCl 10 MG TABS Take 1 tablet (10 mg total) by mouth every 4 (four) hours as needed.  . pantoprazole (PROTONIX) 20 MG tablet TAKE 1 TABLET BY MOUTH 2 TIMES DAILY  . prochlorperazine (COMPAZINE) 10 MG tablet Take 1 tablet (10 mg total) by mouth every 6 (six) hours as needed (Nausea or vomiting).  . sucralfate (CARAFATE) 1 g tablet Take 1 tablet (1 g total) by mouth 3 (three) times daily. Dissolve in 3-4 tbsp warm water, swish and swallow.  . vitamin B-12 (CYANOCOBALAMIN) 1000 MCG tablet Take 1 tablet (1,000 mcg total) by mouth daily.  Marland Kitchen VITAMIN D, CHOLECALCIFEROL, PO Take 5,000 Units by mouth.  . [DISCONTINUED] pantoprazole (PROTONIX) 20 MG tablet TAKE 1 TABLET BY MOUTH 2 TIMES DAILY   No facility-administered encounter medications on file as of 03/20/2020.      Demarrio Menges Jenetta Downer, NP

## 2020-03-21 NOTE — Progress Notes (Signed)
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 She presented to Dr. Wilburn Cornelia symptoms of right flank pain which was radiating to her front which prompted a CT abdomen. CT abdomen pelvis with contrast showed a 3.7 x 2.8 x 5 cm right adrenal mass. 1.7 x 1.2 x 2.1 cm left adrenal nodule 1.8 cm right renal simple cyst. And a new 3.6 x 3.2 cm spiculated mass in the right lower lobe with tethering of the adjacent pleura. Prominent hilar lymph nodes. This was followed by a PET CT scan on 09/20/2019 which showed a 3.6 cm right lower lobe mass which was markedly hypermetabolic along with hypermetabolic metastatic disease in the right supraclavicular space as well as mediastinum and right hilum and hypermetabolic metastatic disease in bilateral adrenal glands. She will also noted to have bilateral hypermetabolic thyroid nodules and a possible nodule in the pelvis adjacent to the sigmoid colon.  Patient currently reports ongoing pain in her right flank which is relatively well controlled with hydrocodone. She also has occasional nausea for which she has as needed nausea medications. She is here with her twin sister today and is highly anxious Of note patient has a history of central core myopathy and follows up with neuromuscular clinic at [facility]. Patient states that her twin  sister has had episodes of hyperthermia but patient herself has not had these episodes. I did review [facility] neuromuscular clinic note and the hyperthermia has been attributed to her history of hyperthyroidism and thyroid dysfunction rather than malignant hyperthermia. However given her history of myopathy patient does carry risk of malignant hyperthermia.  Not enough tissue was available for NGS testing. Patient therefore underwentLiquid biopsy which showed evidence of PI K3 CA amplification, RIC POR amplification, T p53  Patient developed significant scalp and body rash after cycle 1 of carbotaxol Keytruda. This resolved after a trial of steroids and Keytruda was held for cycle 2. Interim scans after4 cycles showed response to treatment  Interval history-patient has chronic back pain which is relatively stable with her pain medications.  Denies any significant nausea or vomiting.Reports that she eats small frequent meals but could do better with her oral intake.  She does report chronic fatigue and has gait imbalance since her whole brain radiation which makes it difficult for her to ambulate freely.  ECOG PS- 2 Pain scale- 3 Opioid associated constipation- no  Review of systems- Review of Systems  Constitutional: Positive for malaise/fatigue. Negative for chills, fever and weight loss.  HENT: Negative for congestion, ear discharge and nosebleeds.   Eyes: Negative for blurred vision.  Respiratory: Negative for cough, hemoptysis, sputum production, shortness of breath and wheezing.   Cardiovascular: Negative for chest pain, palpitations, orthopnea and claudication.  Gastrointestinal: Negative for abdominal pain, blood in stool, constipation, diarrhea, heartburn, melena, nausea and vomiting.  Genitourinary: Negative for dysuria, flank pain, frequency, hematuria and urgency.  Musculoskeletal: Positive for back pain. Negative for  joint pain and myalgias.  Skin: Negative for rash.    Neurological: Negative for dizziness, tingling, focal weakness, seizures, weakness and headaches.  Endo/Heme/Allergies: Does not bruise/bleed easily.  Psychiatric/Behavioral: Negative for depression and suicidal ideas. The patient does not have insomnia.       Allergies  Allergen Reactions  . Paxil [Paroxetine Hcl] Other (See Comments)    Hallucinations   . Ciprofloxacin Other (See Comments)    Unknown  . Levaquin [Levofloxacin] Other (See Comments)    Unknown   . Morphine And Related Other (See Comments)  . 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 She has an increase risk of malignant hyperthermia with anesthesia. She should not receive Propofol.   Donne Hazel [Nefazodone] Other (See Comments)    Unknown   . Biaxin [Clarithromycin] Nausea Only  . Erythromycin Nausea Only  . Penicillins Rash     Past Medical History:  Diagnosis Date  . Anxiety   . Arthritis   . Chronic kidney disease   . Family history of breast cancer    aunt  . GERD (gastroesophageal reflux disease)   . History of DVT of lower extremity    Left leg  . Lung cancer ([place])    with brain mets  . Lung mass   . Malignant hyperthermia   . 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 Muscular dystrophy ([place])   . Muscular dystrophy [facility])      Past Surgical History:  Procedure Laterality Date  . CESAREAN SECTION  11/1985  . COLONOSCOPY N/A 10/27/2015   Procedure: COLONOSCOPY;  Surgeon: Manya Silvas, MD;  Location: [facility] ENDOSCOPY;  Service: Endoscopy;  Laterality: N/A;   NO Propofol - per office  . LITHOTRIPSY    . PORTA CATH INSERTION N/A 11/09/2019   Procedure: PORTA CATH INSERTION;  Surgeon: Algernon Huxley, MD;  Location: [place] CV LAB;  Service: Cardiovascular;  Laterality: N/A;  . PORTA CATH INSERTION N/A 01/23/2020   Procedure: PORTA CATH INSERTION;  Surgeon: Algernon Huxley, MD;  Location: [place] CV LAB;  Service: Cardiovascular;  Laterality: N/A;  . PORTA CATH INSERTION N/A 02/01/2020   Procedure: PORTA CATH INSERTION;  Surgeon: Algernon Huxley, MD;  Location: [place] CV LAB;  Service: Cardiovascular;  Laterality: N/A;  . TUBAL LIGATION  03/1986    Social History   Socioeconomic History  . Marital status: Divorced    Spouse name: Not on file  . Number of children: Not on file  . Years of education: Not on file  . Highest education level: Not on file  Occupational History  . Not on file  Tobacco Use  . Smoking status: Former Smoker    Packs/day: 1.00    Types: Cigarettes    Quit date: 05/2016    Years since quitting: 3.8  . Smokeless tobacco: Never Used  Substance and Sexual Activity  . Alcohol use: No  . Drug use: No  . Sexual activity: Not Currently  Other Topics Concern  . Not on file  Social History Narrative  . Not on file   Social Determinants of Health   Financial Resource Strain:   . Difficulty of Paying Living Expenses:   Food Insecurity:   . Worried About Charity fundraiser in the Last Year:   . Arboriculturist in the Last Year:   [employer]  Needs:   . Lack of Transportation (Medical):   Marland Kitchen Lack of Transportation (Non-Medical):   Physical Activity:   . Days of Exercise per Week:   . Minutes of Exercise per Session:   Stress:   . Feeling of Stress :   Social Connections:   . Frequency of Communication with Friends and Family:   . Frequency of Social Gatherings with Friends and Family:   . Attends Religious Services:   . Active Member of Clubs or Organizations:   . Attends Archivist Meetings:   Marland Kitchen Marital Status:   Intimate Partner Violence:   . Fear of Current or Ex-Partner:   . Emotionally Abused:   Marland Kitchen Physically Abused:   . Sexually Abused:     Family History  Problem Relation Age of Onset  . Cancer Father        bladder, lung  . Breast cancer Maternal Aunt 45     Current Outpatient Medications:  .   acetaminophen (TYLENOL) 325 MG tablet, Take 650 mg by mouth every 6 (six) hours as needed., Disp: , Rfl:  .  ALPRAZolam (XANAX) 0.25 MG tablet, Take 0.25 mg by mouth at bedtime as needed for anxiety., Disp: , Rfl:  .  azithromycin (ZITHROMAX) 250 MG tablet, Take 1 tablet (250 mg total) by mouth daily., Disp: 4 each, Rfl: 0 .  citalopram (CELEXA) 40 MG tablet, Take 1 tablet (40 mg total) by mouth daily., Disp: 30 tablet, Rfl: 3 .  ELIQUIS 5 MG TABS tablet, TAKE 1 TABLET BY MOUTH TWICE A DAY, Disp: 60 tablet, Rfl: 2 .  fentaNYL (DURAGESIC) 12 MCG/HR, PLACE 1 PATCH ONTO THE SKIN EVERY 3 DAYS, Disp: 5 patch, Rfl: 0 .  fluticasone (FLONASE) 50 MCG/ACT nasal spray, Place 2 sprays into both nostrils daily as needed. , Disp: , Rfl:  .  furosemide (LASIX) 20 MG tablet, Take 1 tablet (20 mg total) by mouth as needed., Disp: 30 tablet, Rfl: 0 .  lidocaine-prilocaine (EMLA) cream, Apply to affected area once, Disp: 30 g, Rfl: 3 .  magic mouthwash SOLN, Take 5 mLs by mouth 3 (three) times daily as needed for mouth pain., Disp: 300 mL, Rfl: 1 .  methimazole (TAPAZOLE) 5 MG tablet, Take 5 mg by mouth daily., Disp: , Rfl:  .  nystatin (MYCOSTATIN) 100000 UNIT/ML suspension, Take 5 mLs (500,000 Units total) by mouth 4 (four) times daily. Swish and spit., Disp: 300 mL, Rfl: 0 .  OLANZapine (ZYPREXA) 10 MG tablet, TAKE ONE TABLET BY MOUTH AT BEDTIME (Patient taking differently: 5 mg. ), Disp: 30 tablet, Rfl: 1 .  ondansetron (ZOFRAN) 8 MG tablet, Take 1 tablet (8 mg total) by mouth 2 (two) times daily as needed for refractory nausea / vomiting. Start on day 3 after carboplatin chemo., Disp: 30 tablet, Rfl: 1 .  ondansetron (ZOFRAN-ODT) 8 MG disintegrating tablet, Take 8 mg by mouth 3 (three) times daily., Disp: , Rfl:  .  Oxycodone HCl 10 MG TABS, Take 1 tablet (10 mg total) by mouth every 4 (four) hours as needed., Disp: 60 tablet, Rfl: 0 .  pantoprazole (PROTONIX) 20 MG tablet, TAKE 1 TABLET BY MOUTH 2 TIMES DAILY,  Disp: 120 tablet, Rfl: 0 .  prochlorperazine (COMPAZINE) 10 MG tablet, Take 1 tablet (10 mg total) by mouth every 6 (six) hours as needed (Nausea or vomiting)., Disp: 30 tablet, Rfl: 1 .  sucralfate (CARAFATE) 1 g tablet, Take 1 tablet (1 g total) by mouth 3 (three) times daily. Dissolve  in 3-4 tbsp warm water, swish and swallow., Disp: 90 tablet, Rfl: 3 .  vitamin B-12 (CYANOCOBALAMIN) 1000 MCG tablet, Take 1 tablet (1,000 mcg total) by mouth daily., Disp: 30 tablet, Rfl: 1 .  VITAMIN D, CHOLECALCIFEROL, PO, Take 5,000 Units by mouth., Disp: , Rfl:   Physical exam:  Vitals:   03/18/20 1104  BP: 112/64  Pulse: 62  Temp: 98.1 F (36.7 C)  TempSrc: Tympanic  Weight: 133 lb (60.3 kg)  Height: 5' 1" (1.549 m)   Physical Exam Constitutional:      Comments: Sitting in a wheelchair.  Appears in no acute distress  Cardiovascular:     Rate and Rhythm: Normal rate and regular rhythm.     Heart sounds: Normal heart sounds.  Pulmonary:     Effort: Pulmonary effort is normal.     Breath sounds: Normal breath sounds.  Abdominal:     General: Bowel sounds are normal.     Palpations: Abdomen is soft.  Skin:    General: Skin is warm and dry.  Neurological:     Mental Status: She is alert and oriented to person, place, and time.      CMP Latest Ref Rng & Units 03/18/2020  Glucose 70 - 99 mg/dL 91  BUN 6 - 20 mg/dL 13  Creatinine 0.44 - 1.00 mg/dL 0.40(L)  Sodium 135 - 145 mmol/L 139  Potassium 3.5 - 5.1 mmol/L 4.0  Chloride 98 - 111 mmol/L 100  CO2 22 - 32 mmol/L 28  Calcium 8.9 - 10.3 mg/dL 9.4  Total Protein 6.5 - 8.1 g/dL 7.1  Total Bilirubin 0.3 - 1.2 mg/dL 0.3  Alkaline Phos 38 - 126 U/L 105  AST 15 - 41 U/L 24  ALT 0 - 44 U/L 22   CBC Latest Ref Rng & Units 03/18/2020  WBC 4.0 - 10.5 K/uL 6.7  Hemoglobin 12.0 - 15.0 g/dL 11.0(L)  Hematocrit 36.0 - 46.0 % 33.6(L)  Platelets 150 - 400 K/uL 264     Assessment and plan- Patient is a 59 y.o. female withsquamous cell carcinoma  of the lung stage IV [id number] with adrenal and brain metastases.   She is here for on treatment assessment prior to cycle 3 of maintenance Keytruda  Counts okay to proceed with cycle 3 of maintenance Keytruda today.  I will see her back in 3 weeks for cycle 4.  Plan to repeat scans after 4 cycles.  Normocytic anemia: Likely secondary to prior chemotherapy and anemia of malignancy.  Hemoglobin stable between 10-11.  Continue to monitor  Chronic fatigue: Encouraged the patient to ambulate more with the help of a walker and continue some low level exercises the best she can participate.  Chronic back pain: Probably not related to her malignancy.  She does have bilateral adrenal metastases as well and has some pain secondary to it.  She is currently on oxycodone 10 mg every 4 hours as needed which she uses sparingly.  She is also on fentanyl patch 12 mcg daily  History of atrial fibrillation: On Eliquis  Disposition: She is currently a DNR and home palliative care will also be following her.   Visit Diagnosis 1. Encounter for antineoplastic immunotherapy   2. Malignant neoplasm metastatic to both adrenal glands ([place])   3. Malignant neoplasm of lower lobe of right lung ([place])   4. Neoplasm related pain   5. Normocytic anemia      Dr. Randa Evens, MD, MPH [facility] [phone]  03/21/2020 8:54 AM

## 2020-03-24 ENCOUNTER — Other Ambulatory Visit: Payer: Self-pay | Admitting: Oncology

## 2020-03-25 ENCOUNTER — Telehealth: Payer: Self-pay | Admitting: *Deleted

## 2020-03-25 ENCOUNTER — Other Ambulatory Visit: Payer: Self-pay | Admitting: *Deleted

## 2020-03-25 ENCOUNTER — Other Ambulatory Visit: Payer: Self-pay

## 2020-03-25 ENCOUNTER — Ambulatory Visit
Admission: RE | Admit: 2020-03-25 | Discharge: 2020-03-25 | Disposition: A | Discharge status: Home / Self Care | Payer: Medicare Other | Source: Ambulatory Visit | Attending: Oncology | Admitting: Oncology

## 2020-03-25 DIAGNOSIS — C3490 Malignant neoplasm of unspecified part of unspecified bronchus or lung: Secondary | ICD-10-CM | POA: Diagnosis present

## 2020-03-25 DIAGNOSIS — C7931 Secondary malignant neoplasm of brain: Secondary | ICD-10-CM | POA: Diagnosis present

## 2020-03-25 MED ORDER — OXYCODONE HCL 10 MG PO TABS: 10.0000 mg | ORAL_TABLET | ORAL | 0 refills | Status: DC | PRN

## 2020-03-25 MED ORDER — GADOBUTROL 1 MMOL/ML IV SOLN
6.0000 mL | Freq: Once | INTRAVENOUS | Status: AC | PRN
  Administered 2020-03-25: 11:00:00 6 mL via INTRAVENOUS

## 2020-03-25 NOTE — Telephone Encounter (Signed)
Patient called cancer center requesting refill of fentanyl 12 MCG's every 72 hours.  As mandated by the [place] STOP Act (Strengthen Opioid Misuse Prevention), the [place] Controlled Substance Reporting System ([place]) was reviewed for this patient. Below is the past 36-months of controlled substance prescriptions as displayed by the registry. I have personally consulted with my supervising physician, Dr. Janese Banks, who agrees that continuation of opiate therapy is medically appropriate at this time and agrees to provide continual monitoring, including urine/blood drug screens, as indicated. Refill is appropriate on or after 03/20/2020.  NCCSRS reviewed: Reviewed and appropriate    Deborah Casa, NP 03/25/2020 12:26 PM [phone]

## 2020-03-25 NOTE — Telephone Encounter (Signed)
Called report  IMPRESSION: Mixed interval treatment response with regression of bilateral temporal lobe and left posterior basal ganglia enhancing lesions, interval decrease in size of left occipital lobe and right parietal cortex lesions. Interval development of Joe right frontal lobe lesion, 1 left frontal lobe lesion and 1 right cerebellar hemisphere lesion.  These results will be called to the ordering clinician or representative by the Radiologist Assistant, and communication documented in the PACS or [employer].   Electronically Signed   By: Pedro Earls M.D.   On: 03/25/2020 16:21

## 2020-03-26 ENCOUNTER — Telehealth: Payer: Self-pay | Admitting: *Deleted

## 2020-03-26 NOTE — Telephone Encounter (Signed)
Pt's sister saw MRI results on MyChart and she would like to discuss the results over the phone so she has a better understanding.   Please advise.

## 2020-03-27 NOTE — Telephone Encounter (Signed)
Ok if she feels worse, she will call us or go to ER

## 2020-03-27 NOTE — Telephone Encounter (Signed)
Spoke to patient's sister regarding MRI results and gave MD recommendations to continue with immunotherapy and follow up MRI in 2 months. Pt's sister is concerned about pt regarding new symptoms of fever of 101.4 last night and 100 this morning. Pt refuses to be seen at the [facility] or the ED stating that she is fine and will just take Tylenol for the fever. Pt's sister wanted Dr. Janese Banks to be aware.

## 2020-03-27 NOTE — Telephone Encounter (Signed)
I reached out to dr. Debroah Loop who does not plan any radiation at this time as lesions are small. Immunotherapy can have cns penetration. So I am planning to continue [place] with repeat mri in 2 months

## 2020-04-01 NOTE — Progress Notes (Signed)
Pharmacist Chemotherapy Monitoring - Follow Up Assessment    I verify that I have reviewed each item in the below checklist:  . Regimen for the patient is scheduled for the appropriate day and plan matches scheduled date. Marland Kitchen Appropriate non-routine labs are ordered dependent on drug ordered. . If applicable, additional medications reviewed and ordered per protocol based on lifetime cumulative doses and/or treatment regimen.   Plan for follow-up and/or issues identified: Yes . I-vent associated with next due treatment: No . MD and/or nursing notified: Yes - needs orders placed  Adelina Mings 04/01/2020 8:43 AM

## 2020-04-02 ENCOUNTER — Telehealth: Payer: Self-pay | Admitting: Adult Health Nurse Practitioner

## 2020-04-02 NOTE — Telephone Encounter (Signed)
Returned call from [employer].  He has concerns that patient and family are needing help with caregiver needs in the home.  He has requested SW involvement.  Discussed PCS services through Medicaid and have forwarded him the form to fill out per his request.  Have reached out to SW and have scheduler setting up a joint visit with the patient and sister.   Jammy Stlouis K. Olena Heckle NP

## 2020-04-02 NOTE — Telephone Encounter (Signed)
Spoke with patient's sister, Jearld Fenton and have scheduled a joint visit with our Palliative NP and SW for 04/10/20 @ 9 AM.

## 2020-04-04 ENCOUNTER — Telehealth: Payer: Self-pay | Admitting: *Deleted

## 2020-04-04 NOTE — Telephone Encounter (Signed)
If she can be watched closely and family can manage her I would recommend that and re evaluate her on Tuesday. If family cannot manage her at home then take her to ER.

## 2020-04-04 NOTE — Telephone Encounter (Signed)
Pt's sister called and left message asking what is pt's prognosis now since her brain MRI revealed new lesions. Please advise.

## 2020-04-04 NOTE — Telephone Encounter (Signed)
Pt's sister has been made aware. Thank you!

## 2020-04-04 NOTE — Telephone Encounter (Signed)
Pt's sister made aware and she will discuss further on Tuesday. She would like to ask you her questions in private if possible.

## 2020-04-04 NOTE — Telephone Encounter (Signed)
Pt's sister called back concerned about pt getting more confused with weakness. Pt has episodes of confusion randomly throughout the day and pt's sister doesn't know if pt needs to be evaluated or if her confusion can just be closely watched for now. Also, pt has been progressively getting weaker and needing assistance getting up out of the chair.

## 2020-04-04 NOTE — Telephone Encounter (Signed)
Will discuss on tuesday

## 2020-04-05 ENCOUNTER — Inpatient Hospital Stay
Admission: EM | Admit: 2020-04-05 | Discharge: 2020-04-10 | DRG: 864 | Disposition: A | Discharge status: Skilled Nursing Facility | Payer: Medicare Other | Attending: Internal Medicine | Admitting: Internal Medicine

## 2020-04-05 ENCOUNTER — Emergency Department: Payer: Medicare Other

## 2020-04-05 DIAGNOSIS — Z20822 Contact with and (suspected) exposure to covid-19: Secondary | ICD-10-CM | POA: Diagnosis present

## 2020-04-05 DIAGNOSIS — C3431 Malignant neoplasm of lower lobe, right bronchus or lung: Secondary | ICD-10-CM | POA: Diagnosis present

## 2020-04-05 DIAGNOSIS — R531 Weakness: Secondary | ICD-10-CM

## 2020-04-05 DIAGNOSIS — D630 Anemia in neoplastic disease: Secondary | ICD-10-CM | POA: Diagnosis present

## 2020-04-05 DIAGNOSIS — Z923 Personal history of irradiation: Secondary | ICD-10-CM

## 2020-04-05 DIAGNOSIS — C7970 Secondary malignant neoplasm of unspecified adrenal gland: Secondary | ICD-10-CM | POA: Diagnosis present

## 2020-04-05 DIAGNOSIS — Z803 Family history of malignant neoplasm of breast: Secondary | ICD-10-CM

## 2020-04-05 DIAGNOSIS — R262 Difficulty in walking, not elsewhere classified: Secondary | ICD-10-CM | POA: Diagnosis present

## 2020-04-05 DIAGNOSIS — R509 Fever, unspecified: Secondary | ICD-10-CM | POA: Diagnosis not present

## 2020-04-05 DIAGNOSIS — F411 Generalized anxiety disorder: Secondary | ICD-10-CM | POA: Diagnosis present

## 2020-04-05 DIAGNOSIS — C7931 Secondary malignant neoplasm of brain: Secondary | ICD-10-CM | POA: Diagnosis present

## 2020-04-05 DIAGNOSIS — Z515 Encounter for palliative care: Secondary | ICD-10-CM | POA: Diagnosis present

## 2020-04-05 DIAGNOSIS — R530 Neoplastic (malignant) related fatigue: Secondary | ICD-10-CM | POA: Diagnosis present

## 2020-04-05 DIAGNOSIS — Z888 Allergy status to other drugs, medicaments and biological substances status: Secondary | ICD-10-CM

## 2020-04-05 DIAGNOSIS — Z66 Do not resuscitate: Secondary | ICD-10-CM | POA: Diagnosis present

## 2020-04-05 DIAGNOSIS — G936 Cerebral edema: Secondary | ICD-10-CM | POA: Diagnosis present

## 2020-04-05 DIAGNOSIS — Z79899 Other long term (current) drug therapy: Secondary | ICD-10-CM

## 2020-04-05 DIAGNOSIS — Z881 Allergy status to other antibiotic agents status: Secondary | ICD-10-CM

## 2020-04-05 DIAGNOSIS — M549 Dorsalgia, unspecified: Secondary | ICD-10-CM | POA: Diagnosis present

## 2020-04-05 DIAGNOSIS — Z7901 Long term (current) use of anticoagulants: Secondary | ICD-10-CM

## 2020-04-05 DIAGNOSIS — K219 Gastro-esophageal reflux disease without esophagitis: Secondary | ICD-10-CM | POA: Diagnosis present

## 2020-04-05 DIAGNOSIS — C3490 Malignant neoplasm of unspecified part of unspecified bronchus or lung: Secondary | ICD-10-CM

## 2020-04-05 DIAGNOSIS — T451X5A Adverse effect of antineoplastic and immunosuppressive drugs, initial encounter: Secondary | ICD-10-CM | POA: Diagnosis present

## 2020-04-05 DIAGNOSIS — F17210 Nicotine dependence, cigarettes, uncomplicated: Secondary | ICD-10-CM | POA: Diagnosis present

## 2020-04-05 DIAGNOSIS — Z880 Allergy status to penicillin: Secondary | ICD-10-CM

## 2020-04-05 DIAGNOSIS — Z86718 Personal history of other venous thrombosis and embolism: Secondary | ICD-10-CM

## 2020-04-05 DIAGNOSIS — G8929 Other chronic pain: Secondary | ICD-10-CM | POA: Diagnosis present

## 2020-04-05 DIAGNOSIS — R1011 Right upper quadrant pain: Secondary | ICD-10-CM | POA: Diagnosis present

## 2020-04-05 DIAGNOSIS — G9341 Metabolic encephalopathy: Secondary | ICD-10-CM | POA: Diagnosis present

## 2020-04-05 DIAGNOSIS — D6481 Anemia due to antineoplastic chemotherapy: Secondary | ICD-10-CM | POA: Diagnosis present

## 2020-04-05 DIAGNOSIS — E0590 Thyrotoxicosis, unspecified without thyrotoxic crisis or storm: Secondary | ICD-10-CM | POA: Diagnosis present

## 2020-04-05 DIAGNOSIS — Z885 Allergy status to narcotic agent status: Secondary | ICD-10-CM

## 2020-04-05 DIAGNOSIS — G7100 Muscular dystrophy, unspecified: Secondary | ICD-10-CM | POA: Diagnosis present

## 2020-04-05 DIAGNOSIS — Z87891 Personal history of nicotine dependence: Secondary | ICD-10-CM

## 2020-04-05 LAB — CBC
HCT: 32.8 % — ABNORMAL LOW (ref 36.0–46.0)
Hemoglobin: 10.4 g/dL — ABNORMAL LOW (ref 12.0–15.0)
MCH: 31.1 pg (ref 26.0–34.0)
MCHC: 31.7 g/dL (ref 30.0–36.0)
MCV: 98.2 fL (ref 80.0–100.0)
Platelets: 339 10*3/uL (ref 150–400)
RBC: 3.34 MIL/uL — ABNORMAL LOW (ref 3.87–5.11)
RDW: 11.8 % (ref 11.5–15.5)
WBC: 9.8 10*3/uL (ref 4.0–10.5)
nRBC: 0 % (ref 0.0–0.2)

## 2020-04-05 LAB — BASIC METABOLIC PANEL
Anion gap: 7 (ref 5–15)
BUN: 13 mg/dL (ref 6–20)
CO2: 31 mmol/L (ref 22–32)
Calcium: 10.3 mg/dL (ref 8.9–10.3)
Chloride: 100 mmol/L (ref 98–111)
Creatinine, Ser: 0.44 mg/dL (ref 0.44–1.00)
GFR calc Af Amer: >60 mL/min (ref >60)
GFR calc non Af Amer: >60 mL/min (ref >60)
Glucose, Bld: 101 mg/dL — ABNORMAL HIGH (ref 70–99)
Potassium: 4.5 mmol/L (ref 3.5–5.1)
Sodium: 138 mmol/L (ref 135–145)

## 2020-04-05 MED ORDER — LACTATED RINGERS IV BOLUS
1000.0000 mL | Freq: Once | INTRAVENOUS | Status: AC
  Administered 2020-04-06: 1000 mL via INTRAVENOUS

## 2020-04-05 MED ORDER — SODIUM CHLORIDE 0.9% FLUSH: 3.0000 mL | Freq: Once | INTRAVENOUS | Status: DC

## 2020-04-05 NOTE — ED Triage Notes (Signed)
Pt to ED from home via [facility] c/o weakness. 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

## 2020-04-05 NOTE — ED Provider Notes (Signed)
[facility] Emergency Department Provider Note   ____________________________________________   First MD Initiated Contact with Patient 04/05/20 2301     (approximate)  I have reviewed the triage vital signs and the nursing notes.   HISTORY  Chief Complaint Weakness    HPI Deborah Nguyen is a 59 y.o. 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  83 of history is provided by son at bedside, who states that the patient has been increasingly weak over the past 7 days.  She typically is able to get around using a walker, but has recently been unable to bear her own weight.  Patient reports feeling generally weak, especially in both legs, but denies any vision changes or speech changes.  She has not had any headaches, did have follow-up MRI performed last week that showed regression of some of her brain lesions but also development of other new lesions.  She denies any recent fevers, but per chart had a temp of 101.4 for about 10 days ago.  She denies any cough, chest pain, shortness of breath, abdominal pain, nausea, vomiting, dysuria, or hematuria.  Family also states that she has been increasingly confused and is not aware that she continues to have cancer.        Past Medical History:  Diagnosis Date  . Anxiety   . Arthritis   . Chronic kidney disease   . Family history of breast cancer    aunt  . GERD (gastroesophageal reflux disease)   . History of DVT of lower extremity    Left leg  . Lung cancer ([place])    with brain mets  . Lung mass   . Malignant hyperthermia   . 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 Muscular dystrophy ([place])   . Muscular dystrophy [facility])     Patient Active Problem List   Diagnosis Date Noted  . Generalized weakness 04/06/2020  . Acute confusion  04/06/2020  . Lung cancer metastatic to brain ([place]) 04/06/2020  . Port-A-Cath in place 01/15/2020  . Muscular dystrophy ([place]) 10/24/2019  . Thyroid nodule 10/24/2019  . Goals of care, counseling/discussion 09/20/2019  . 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 Malignant neoplasm of lower lobe of right lung ([place]) 09/20/2019  . GAD (generalized anxiety disorder) 09/01/2018  . History of kidney stones 09/01/2018  . Subclinical hyperthyroidism 01/24/2017  . Central core myopathy 09/17/2016  . Low serum vitamin D 09/17/2016  . Hx of adenomatous colonic polyps 08/01/2015  . History of DVT of lower extremity 05/23/2015  . Anxiety 05/31/2014  . GERD (gastroesophageal reflux disease) 05/31/2014  . Febrile illness 05/06/2013  . 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 CESAREAN SECTION  11/1985  . COLONOSCOPY N/A 10/27/2015   Procedure: COLONOSCOPY;  Surgeon: Manya Silvas, MD;  Location: [facility] ENDOSCOPY;  Service: Endoscopy;  Laterality: N/A;   NO Propofol - per office  . LITHOTRIPSY    . PORTA CATH INSERTION N/A 11/09/2019   Procedure: PORTA CATH INSERTION;  Surgeon: Algernon Huxley, MD;  Location: [facility] CV LAB;  Service: Cardiovascular;  Laterality: N/A;  . PORTA CATH INSERTION N/A 01/23/2020   Procedure: PORTA CATH INSERTION;  Surgeon: Algernon Huxley, MD;  Location: [facility] CV LAB;  Service: Cardiovascular;  Laterality: N/A;  . PORTA CATH INSERTION N/A 02/01/2020   Procedure: PORTA CATH INSERTION;  Surgeon: Algernon Huxley, MD;  Location: [facility] CV LAB;  Service: Cardiovascular;  Laterality: N/A;  . TUBAL LIGATION  03/1986    Prior to Admission medications   Medication Sig Start Date End Date Taking? Authorizing Provider  acetaminophen (TYLENOL) 325 MG tablet Take 650 mg by mouth every 6 (six) hours as needed.    [provider]  ALPRAZolam Duanne Moron) 0.25 MG tablet Take 0.25 mg  by mouth at bedtime as needed for anxiety.    [provider]  azithromycin (ZITHROMAX) 250 MG tablet Take 1 tablet (250 mg total) by mouth daily. 02/03/20   Paulette Blanch, MD  citalopram (CELEXA) 40 MG tablet Take 1 tablet (40 mg total) by mouth daily. 12/11/19   Borders, Kirt Boys, NP  ELIQUIS 5 MG TABS tablet TAKE 1 TABLET BY MOUTH TWICE A DAY 03/17/20   Sindy Guadeloupe, MD  fentaNYL (DURAGESIC) 12 MCG/HR PLACE 1 PATCH ONTO THE SKIN EVERY 3 DAYS 03/25/20   Jacquelin Hawking, NP  fluticasone (FLONASE) 50 MCG/ACT nasal spray Place 2 sprays into both nostrils daily as needed.     [provider]  furosemide (LASIX) 20 MG tablet Take 1 tablet (20 mg total) by mouth as needed. 02/08/20   Sindy Guadeloupe, MD  lidocaine-prilocaine (EMLA) cream Apply to affected area once 11/06/19   Sindy Guadeloupe, MD  magic mouthwash SOLN Take 5 mLs by mouth 3 (three) times daily as needed for mouth pain. 10/19/19   Sindy Guadeloupe, MD  methimazole (TAPAZOLE) 5 MG tablet Take 5 mg by mouth daily.    [provider]  nystatin (MYCOSTATIN) 100000 UNIT/ML suspension Take 5 mLs (500,000 Units total) by mouth 4 (four) times daily. Swish and spit. 10/08/19   Sindy Guadeloupe, MD  OLANZapine (ZYPREXA) 10 MG tablet TAKE ONE TABLET BY MOUTH AT BEDTIME Patient taking differently: 5 mg.  02/15/20   Jacquelin Hawking, NP  ondansetron (ZOFRAN) 8 MG tablet Take 1 tablet (8 mg total) by mouth 2 (two) times daily as needed for refractory nausea / vomiting. Start on day 3 after carboplatin chemo. 12/17/19   Sindy Guadeloupe, MD  ondansetron (ZOFRAN-ODT) 8 MG disintegrating tablet Take 8 mg by mouth 3 (three) times daily. 01/16/20   [provider]  Oxycodone HCl 10 MG TABS Take 1 tablet (10 mg total) by mouth every 4 (four) hours as needed. 03/25/20   Borders, Kirt Boys, NP  pantoprazole (PROTONIX) 20 MG tablet TAKE 1 TABLET BY MOUTH 2 TIMES DAILY 02/27/20   Sindy Guadeloupe, MD  prochlorperazine (COMPAZINE) 10 MG tablet Take  1 tablet (10 mg total) by mouth every 6 (six) hours as needed (Nausea or vomiting). 11/06/19   Sindy Guadeloupe, MD  sucralfate (CARAFATE) 1 g tablet Take 1 tablet (1 g total) by mouth 3 (three) times daily. Dissolve in 3-4 tbsp warm water, swish and swallow. 10/30/19   Noreene Filbert, MD  vitamin B-12 (CYANOCOBALAMIN) 1000 MCG tablet Take 1 tablet (1,000 mcg total) by mouth daily. 10/19/19   Sindy Guadeloupe, MD  VITAMIN D, CHOLECALCIFEROL, PO Take 5,000 Units by mouth.    [provider]  pantoprazole (PROTONIX) 20 MG tablet TAKE 1 TABLET BY MOUTH 2 TIMES DAILY 12/18/19   Sindy Guadeloupe, MD    Allergies Paxil [paroxetine hcl], Ciprofloxacin, Levaquin [levofloxacin], Morphine and related, Propofol, Serzone [  nefazodone], Biaxin [clarithromycin], Erythromycin, and Penicillins  Family History  Problem Relation Age of Onset  . Cancer Father        bladder, lung  . Breast cancer Maternal Aunt 74    Social History Social History   Tobacco Use  . Smoking status: Former Smoker    Packs/day: 1.00    Types: Cigarettes    Quit date: 05/2016    Years since quitting: 3.9  . Smokeless tobacco: Never Used  Substance Use Topics  . Alcohol use: No  . Drug use: No    Review of Systems  Constitutional: No fever/chills.  Positive for generalized weakness. Eyes: No visual changes. ENT: No sore throat. Cardiovascular: Denies chest pain. Respiratory: Denies shortness of breath. Gastrointestinal: No abdominal pain.  No nausea, no vomiting.  No diarrhea.  No constipation. Genitourinary: Negative for dysuria. Musculoskeletal: Negative for back pain. Skin: Negative for rash. Neurological: Negative for headaches, focal weakness or numbness.  ____________________________________________   PHYSICAL EXAM:  VITAL SIGNS: ED Triage Vitals  Enc Vitals Group     BP 04/05/20 2209 116/74     Pulse Rate 04/05/20 2209 76     Resp 04/05/20 2209 16     Temp 04/05/20 2209 99.1 F (37.3 C)      Temp Source 04/05/20 2209 Oral     SpO2 04/05/20 2206 95 %     Weight 04/05/20 2211 133 lb (60.3 kg)     Height 04/05/20 2211 5\' 1"  (1.549 m)     Head Circumference --      Peak Flow --      Pain Score 04/05/20 2210 4     Pain Loc --      Pain Edu? --      Excl. in [place]? --     Constitutional: Awake and alert. Eyes: Conjunctivae are normal. Head: Atraumatic. Nose: No congestion/rhinnorhea. Mouth/Throat: Mucous membranes are moist. Neck: Normal ROM Cardiovascular: Normal rate, regular rhythm. Grossly normal heart sounds. Respiratory: Normal respiratory effort.  No retractions. Lungs CTAB. Gastrointestinal: Soft and nontender. No distention. Genitourinary: deferred Musculoskeletal: No lower extremity tenderness nor edema. Neurologic:  Normal speech and language. No gross focal neurologic deficits are appreciated, global weakness noted. Skin:  Skin is warm, dry and intact. No rash noted. Psychiatric: Mood and affect are normal. Speech and behavior are normal.  ____________________________________________   LABS (all labs ordered are listed, but only abnormal results are displayed)  Labs Reviewed  BASIC METABOLIC PANEL - Abnormal; Notable for the following components:      Result Value   Glucose, Bld 101 (*)    All other components within normal limits  CBC - Abnormal; Notable for the following components:   RBC 3.34 (*)    Hemoglobin 10.4 (*)    HCT 32.8 (*)    All other components within normal limits  URINALYSIS, COMPLETE (UACMP) WITH MICROSCOPIC - Abnormal; Notable for the following components:   Color, Urine YELLOW (*)    APPearance CLEAR (*)    Hgb urine dipstick SMALL (*)    All other components within normal limits  CK - Abnormal; Notable for the following components:   Total CK 30 (*)    All other components within normal limits  HEPATIC FUNCTION PANEL - Abnormal; Notable for the following components:   Albumin 2.9 (*)    All other components within normal  limits  RESPIRATORY PANEL BY RT PCR (FLU A&B, COVID)  CULTURE, BLOOD (ROUTINE X 2)  CULTURE, BLOOD (ROUTINE X  2)  LACTIC ACID, PLASMA  LACTIC ACID, PLASMA  CBG MONITORING, ED   ____________________________________________  EKG  ED ECG REPORT I, Blake Divine, the attending physician, personally viewed and interpreted this ECG.   Date: 04/05/2020  EKG Time: 22:12  Rate: 74  Rhythm: normal sinus rhythm  Axis: Normal  Intervals:none  ST&T Change: None   PROCEDURES  Procedure(s) performed (including Critical Care):  Procedures   ____________________________________________   INITIAL IMPRESSION / ASSESSMENT AND PLAN / ED COURSE       59 year old female with history of lung cancer metastatic to brain presents to the ED with increased generalized weakness as well as frequent confusion over about the past week.  She is now having difficulty caring for herself and family is also having difficulty caring for her as she is no longer able to walk at this point.  She did have follow-up MRI performed within the past couple of weeks that showed 2 new brain lesions, which are very small at 2 mm and 3 mm.  Given no focal neurologic deficits at this time, we will hold off on repeat brain imaging.  Basic labs are unremarkable thus far, EKG with no evidence of ischemia or arrhythmia.  Plan to assess for infection with chest x-ray and UA, also add on CK as well as LFTs.  Remainder of lab work unremarkable, patient noted to have rectal temp of 100.6, but vital signs are otherwise not consistent with sepsis.  UA and chest x-ray showed no evidence of infection.  She did have some increasing confusion here in the ED, however CT head was negative for acute process.  I do not suspect meningitis as she has no headache or neck stiffness, there is no abdominal tenderness, and she has no signs of skin or soft tissue infection.  Etiology of fever is unclear at this point and COVID-19 testing is negative.   We will cover with broad-spectrum antibiotics and case was discussed with hospitalist for admission.      ____________________________________________   FINAL CLINICAL IMPRESSION(S) / ED DIAGNOSES  Final diagnoses:  Fever, unspecified fever cause  Generalized weakness     ED Discharge Orders    None       Note:  This document was prepared using Dragon voice recognition software and may include unintentional dictation errors.   Blake Divine, MD 04/06/20 [phone]

## 2020-04-06 ENCOUNTER — Inpatient Hospital Stay: Payer: Medicare Other

## 2020-04-06 ENCOUNTER — Emergency Department: Payer: Medicare Other

## 2020-04-06 ENCOUNTER — Encounter: Payer: Self-pay | Admitting: Internal Medicine

## 2020-04-06 ENCOUNTER — Other Ambulatory Visit: Payer: Self-pay

## 2020-04-06 DIAGNOSIS — E0590 Thyrotoxicosis, unspecified without thyrotoxic crisis or storm: Secondary | ICD-10-CM | POA: Diagnosis present

## 2020-04-06 DIAGNOSIS — Z66 Do not resuscitate: Secondary | ICD-10-CM | POA: Diagnosis present

## 2020-04-06 DIAGNOSIS — D630 Anemia in neoplastic disease: Secondary | ICD-10-CM

## 2020-04-06 DIAGNOSIS — Z7189 Other specified counseling: Secondary | ICD-10-CM | POA: Diagnosis not present

## 2020-04-06 DIAGNOSIS — D6481 Anemia due to antineoplastic chemotherapy: Secondary | ICD-10-CM | POA: Diagnosis present

## 2020-04-06 DIAGNOSIS — K219 Gastro-esophageal reflux disease without esophagitis: Secondary | ICD-10-CM | POA: Diagnosis present

## 2020-04-06 DIAGNOSIS — R1011 Right upper quadrant pain: Secondary | ICD-10-CM | POA: Diagnosis present

## 2020-04-06 DIAGNOSIS — Z79899 Other long term (current) drug therapy: Secondary | ICD-10-CM | POA: Diagnosis not present

## 2020-04-06 DIAGNOSIS — F17210 Nicotine dependence, cigarettes, uncomplicated: Secondary | ICD-10-CM | POA: Diagnosis present

## 2020-04-06 DIAGNOSIS — C7972 Secondary malignant neoplasm of left adrenal gland: Secondary | ICD-10-CM | POA: Diagnosis not present

## 2020-04-06 DIAGNOSIS — R531 Weakness: Secondary | ICD-10-CM | POA: Diagnosis not present

## 2020-04-06 DIAGNOSIS — R509 Fever, unspecified: Secondary | ICD-10-CM

## 2020-04-06 DIAGNOSIS — C7971 Secondary malignant neoplasm of right adrenal gland: Secondary | ICD-10-CM

## 2020-04-06 DIAGNOSIS — C7931 Secondary malignant neoplasm of brain: Secondary | ICD-10-CM

## 2020-04-06 DIAGNOSIS — F411 Generalized anxiety disorder: Secondary | ICD-10-CM | POA: Diagnosis present

## 2020-04-06 DIAGNOSIS — Z923 Personal history of irradiation: Secondary | ICD-10-CM

## 2020-04-06 DIAGNOSIS — C3490 Malignant neoplasm of unspecified part of unspecified bronchus or lung: Secondary | ICD-10-CM | POA: Diagnosis not present

## 2020-04-06 DIAGNOSIS — C3431 Malignant neoplasm of lower lobe, right bronchus or lung: Secondary | ICD-10-CM

## 2020-04-06 DIAGNOSIS — Z515 Encounter for palliative care: Secondary | ICD-10-CM | POA: Diagnosis present

## 2020-04-06 DIAGNOSIS — R530 Neoplastic (malignant) related fatigue: Secondary | ICD-10-CM

## 2020-04-06 DIAGNOSIS — C7970 Secondary malignant neoplasm of unspecified adrenal gland: Secondary | ICD-10-CM | POA: Diagnosis present

## 2020-04-06 DIAGNOSIS — Z20822 Contact with and (suspected) exposure to covid-19: Secondary | ICD-10-CM | POA: Diagnosis present

## 2020-04-06 DIAGNOSIS — T451X5A Adverse effect of antineoplastic and immunosuppressive drugs, initial encounter: Secondary | ICD-10-CM | POA: Diagnosis present

## 2020-04-06 DIAGNOSIS — Z87891 Personal history of nicotine dependence: Secondary | ICD-10-CM | POA: Diagnosis not present

## 2020-04-06 DIAGNOSIS — Z803 Family history of malignant neoplasm of breast: Secondary | ICD-10-CM | POA: Diagnosis not present

## 2020-04-06 DIAGNOSIS — Z86718 Personal history of other venous thrombosis and embolism: Secondary | ICD-10-CM | POA: Diagnosis not present

## 2020-04-06 DIAGNOSIS — Z9221 Personal history of antineoplastic chemotherapy: Secondary | ICD-10-CM | POA: Diagnosis not present

## 2020-04-06 DIAGNOSIS — G893 Neoplasm related pain (acute) (chronic): Secondary | ICD-10-CM

## 2020-04-06 DIAGNOSIS — G7100 Muscular dystrophy, unspecified: Secondary | ICD-10-CM | POA: Diagnosis present

## 2020-04-06 DIAGNOSIS — G8929 Other chronic pain: Secondary | ICD-10-CM | POA: Diagnosis present

## 2020-04-06 DIAGNOSIS — M549 Dorsalgia, unspecified: Secondary | ICD-10-CM | POA: Diagnosis present

## 2020-04-06 DIAGNOSIS — G9341 Metabolic encephalopathy: Secondary | ICD-10-CM | POA: Diagnosis present

## 2020-04-06 DIAGNOSIS — G936 Cerebral edema: Secondary | ICD-10-CM | POA: Diagnosis present

## 2020-04-06 LAB — HEPATIC FUNCTION PANEL
ALT: 24 U/L (ref 0–44)
AST: 26 U/L (ref 15–41)
Albumin: 2.9 g/dL — ABNORMAL LOW (ref 3.5–5.0)
Alkaline Phosphatase: 101 U/L (ref 38–126)
Bilirubin, Direct: <0.1 mg/dL (ref 0.0–0.2)
Total Bilirubin: 0.5 mg/dL (ref 0.3–1.2)
Total Protein: 7.2 g/dL (ref 6.5–8.1)

## 2020-04-06 LAB — RESPIRATORY PANEL BY RT PCR (FLU A&B, COVID)
Influenza A by PCR: NEGATIVE
Influenza B by PCR: NEGATIVE
SARS Coronavirus 2 by RT PCR: NEGATIVE

## 2020-04-06 LAB — GLUCOSE, CAPILLARY
Glucose-Capillary: 134 mg/dL — ABNORMAL HIGH (ref 70–99)
Glucose-Capillary: 166 mg/dL — ABNORMAL HIGH (ref 70–99)

## 2020-04-06 LAB — URINALYSIS, COMPLETE (UACMP) WITH MICROSCOPIC
Bacteria, UA: NONE SEEN
Bilirubin Urine: NEGATIVE
Glucose, UA: NEGATIVE mg/dL
Ketones, ur: NEGATIVE mg/dL
Leukocytes,Ua: NEGATIVE
Nitrite: NEGATIVE
Protein, ur: NEGATIVE mg/dL
Specific Gravity, Urine: 1.016 (ref 1.005–1.030)
pH: 6 (ref 5.0–8.0)

## 2020-04-06 LAB — LACTIC ACID, PLASMA: Lactic Acid, Venous: 1.1 mmol/L (ref 0.5–1.9)

## 2020-04-06 LAB — HIV ANTIBODY (ROUTINE TESTING W REFLEX): HIV Screen 4th Generation wRfx: NONREACTIVE

## 2020-04-06 LAB — CK: Total CK: 30 U/L — ABNORMAL LOW (ref 38–234)

## 2020-04-06 MED ORDER — SODIUM CHLORIDE 0.9 % IV SOLN
1.0000 g | Freq: Once | INTRAVENOUS | Status: AC
  Administered 2020-04-06: 1 g via INTRAVENOUS
  Filled 2020-04-06: qty 1

## 2020-04-06 MED ORDER — SODIUM CHLORIDE 0.9 % IV SOLN: INTRAVENOUS | Status: DC

## 2020-04-06 MED ORDER — METRONIDAZOLE IN NACL 5-0.79 MG/ML-% IV SOLN
500.0000 mg | Freq: Once | INTRAVENOUS | Status: AC
  Administered 2020-04-06: 500 mg via INTRAVENOUS
  Filled 2020-04-06: qty 100

## 2020-04-06 MED ORDER — CITALOPRAM HYDROBROMIDE 20 MG PO TABS
40.0000 mg | ORAL_TABLET | Freq: Every day | ORAL | Status: DC
  Administered 2020-04-06 – 2020-04-10 (×5): 40 mg via ORAL
  Filled 2020-04-06 (×5): qty 2

## 2020-04-06 MED ORDER — SODIUM CHLORIDE 0.9 % IV SOLN
2.0000 g | Freq: Three times a day (TID) | INTRAVENOUS | Status: DC
  Administered 2020-04-06 – 2020-04-08 (×6): 2 g via INTRAVENOUS
  Filled 2020-04-06 (×9): qty 2

## 2020-04-06 MED ORDER — METHIMAZOLE 5 MG PO TABS
5.0000 mg | ORAL_TABLET | Freq: Every day | ORAL | Status: DC
  Administered 2020-04-06 – 2020-04-10 (×5): 5 mg via ORAL
  Filled 2020-04-06 (×5): qty 1

## 2020-04-06 MED ORDER — SODIUM CHLORIDE 0.9 % IV SOLN: INTRAVENOUS | Status: DC | PRN

## 2020-04-06 MED ORDER — VANCOMYCIN HCL IN DEXTROSE 1-5 GM/200ML-% IV SOLN
1000.0000 mg | Freq: Once | INTRAVENOUS | Status: AC
  Administered 2020-04-06: 1000 mg via INTRAVENOUS
  Filled 2020-04-06: qty 200

## 2020-04-06 MED ORDER — DEXAMETHASONE SODIUM PHOSPHATE 4 MG/ML IJ SOLN
4.0000 mg | Freq: Two times a day (BID) | INTRAMUSCULAR | Status: DC
  Administered 2020-04-06 – 2020-04-07 (×3): 4 mg via INTRAVENOUS
  Filled 2020-04-06 (×4): qty 1

## 2020-04-06 MED ORDER — PANTOPRAZOLE SODIUM 20 MG PO TBEC
20.0000 mg | DELAYED_RELEASE_TABLET | Freq: Two times a day (BID) | ORAL | Status: DC
  Administered 2020-04-06 – 2020-04-10 (×9): 20 mg via ORAL
  Filled 2020-04-06 (×10): qty 1

## 2020-04-06 MED ORDER — ACETAMINOPHEN 325 MG PO TABS
650.0000 mg | ORAL_TABLET | Freq: Four times a day (QID) | ORAL | Status: DC | PRN
  Administered 2020-04-08: 650 mg via ORAL
  Filled 2020-04-06: qty 2

## 2020-04-06 MED ORDER — OLANZAPINE 5 MG PO TABS
5.0000 mg | ORAL_TABLET | Freq: Every day | ORAL | Status: DC
  Administered 2020-04-06 – 2020-04-09 (×4): 5 mg via ORAL
  Filled 2020-04-06 (×4): qty 1

## 2020-04-06 MED ORDER — APIXABAN 5 MG PO TABS
5.0000 mg | ORAL_TABLET | Freq: Two times a day (BID) | ORAL | Status: DC
  Administered 2020-04-06 – 2020-04-10 (×10): 5 mg via ORAL
  Filled 2020-04-06 (×10): qty 1

## 2020-04-06 MED ORDER — FENTANYL 12 MCG/HR TD PT72
1.0000 | MEDICATED_PATCH | TRANSDERMAL | Status: DC
  Administered 2020-04-06 – 2020-04-09 (×2): 1 via TRANSDERMAL
  Filled 2020-04-06 (×2): qty 1

## 2020-04-06 MED ORDER — VANCOMYCIN HCL 500 MG/100ML IV SOLN
500.0000 mg | Freq: Two times a day (BID) | INTRAVENOUS | Status: DC
  Administered 2020-04-06 – 2020-04-07 (×4): 500 mg via INTRAVENOUS
  Filled 2020-04-06 (×7): qty 100

## 2020-04-06 MED ORDER — ACETAMINOPHEN 500 MG PO TABS
1000.0000 mg | ORAL_TABLET | Freq: Once | ORAL | Status: AC
  Administered 2020-04-06: 1000 mg via ORAL
  Filled 2020-04-06: qty 2

## 2020-04-06 MED ORDER — ONDANSETRON HCL 4 MG PO TABS
4.0000 mg | ORAL_TABLET | Freq: Four times a day (QID) | ORAL | Status: DC | PRN
  Administered 2020-04-06: 4 mg via ORAL
  Filled 2020-04-06: qty 1

## 2020-04-06 MED ORDER — ONDANSETRON HCL 4 MG/2ML IJ SOLN
4.0000 mg | Freq: Four times a day (QID) | INTRAMUSCULAR | Status: DC | PRN
  Administered 2020-04-06 – 2020-04-08 (×3): 4 mg via INTRAVENOUS
  Filled 2020-04-06 (×3): qty 2

## 2020-04-06 MED ORDER — CHLORHEXIDINE GLUCONATE CLOTH 2 % EX PADS
6.0000 | MEDICATED_PAD | Freq: Every day | CUTANEOUS | Status: DC
  Administered 2020-04-06 – 2020-04-09 (×4): 6 via TOPICAL

## 2020-04-06 MED ORDER — METRONIDAZOLE IN NACL 5-0.79 MG/ML-% IV SOLN
500.0000 mg | Freq: Three times a day (TID) | INTRAVENOUS | Status: DC
  Administered 2020-04-06 – 2020-04-07 (×4): 500 mg via INTRAVENOUS
  Filled 2020-04-06 (×6): qty 100

## 2020-04-06 MED ORDER — OXYCODONE HCL 5 MG PO TABS
10.0000 mg | ORAL_TABLET | ORAL | Status: DC | PRN
  Administered 2020-04-06 – 2020-04-10 (×8): 10 mg via ORAL
  Filled 2020-04-06 (×8): qty 2

## 2020-04-06 MED ORDER — FENTANYL 12 MCG/HR TD PT72: 1.0000 | MEDICATED_PATCH | TRANSDERMAL | Status: DC

## 2020-04-06 MED ORDER — ACETAMINOPHEN 650 MG RE SUPP: 650.0000 mg | Freq: Four times a day (QID) | RECTAL | Status: DC | PRN

## 2020-04-06 NOTE — Progress Notes (Signed)
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 Fever reported at home. Admitted for fever without a source in the context of malignancy and ongoing chemotherapy.  A & P  Fever without a source.  Right upper quadrant pain.  Urinalysis unremarkable. Blood culture pending. Chest x-ray no acute disease. --Etiology unclear.  We will continue empiric antibiotics and follow culture data.  LFTs were unremarkable given right upper quadrant pain, will check right upper quadrant ultrasound to assess the gallbladder.  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 No XRT planned per last oncology note. --Dr. Janese Banks listed on treatment team, will notify tomorrow  Generalized weakness with inability to ambulate --PT evaluation  Acute metabolic encephalopathy.  Chart reports patient's been having confusion at home.  CT head negative. --Appears mild.  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  PMH DVT left leg --Continue apixaban  Chronic fatigue  Chronic back pain not felt to be related to malignancy --Continue fentanyl patch  Muscular dystrophy  Cigarette smoker  Disposition Plan:   Status is: Observation  The patient will require care spanning > 2 midnights and should be moved to inpatient because: IV treatments appropriate due to intensity of illness or inability to take PO   Patient presents with fever without a source in the context of metastatic lung cancer on chemotherapy.  She appears ill.  She will require empiric antibiotics, follow-up culture data.  Dispo: The  patient is from: Home              Anticipated d/c is to: Home              Anticipated d/c date is: 2 days              Patient currently is not medically stable to d/c.  DVT prophylaxis: apixaban Code Status: DNR confirmed w/ patient and c/w oncology note 4/13 and palliative care note 4/15 patient DNR, MOST form home palliative care following. ACP reviewed. Family Communication:     Murray Hodgkins, MD  [facility] Direct contact: see [email or web address] (further directions at bottom of note if needed) 7PM-7AM contact night coverage as at bottom of note 04/06/2020, 11:31 AM  LOS: 0 days   Significant Hospital Events   . 5/1 admitted for fever without a source   Consults:  . Oncology for 5/3   Procedures:  .   Significant Diagnostic Tests:   Chest x-ray no acute disease.  Urinalysis negative.   Micro Data:  . Blood culture pending   Antimicrobials:  . Cefepime 5/2 > . Metronidazole 5/2 > . Vancomycin 5/2 >  Interval History/Subjective  Reports she has had intermittent fever for 6 years. Currently feels better than yesterday.  Urinating okay.  No shortness of breath.  No known rash.  She reports right upper quadrant pain.  Objective   Vitals:  Vitals:   04/06/20 0858 04/06/20 1118  BP: 137/70 124/75  Pulse: 69 78  Resp: 16 20  Temp: 98.3 F (36.8 C) 99.3 F (37.4 C)  SpO2: 99% 98%    Exam:  Constitutional.  Appears mildly anxious, uncomfortable but nontoxic. Respiratory.  Clear to auscultation bilaterally.  No wheezes, rales or rhonchi.  Normal respiratory effort. Cardiovascular.  Regular rate and rhythm.  No murmur, rub or gallop.  No lower extremity edema. Abdomen.  Soft, nontender, nondistended.  No significant right upper quadrant pain with palpation. Skin.  Appears grossly unremarkable, no rash seen over the legs or abdomen.  Port site right upper chest appears unremarkable. Psychiatric.  Grossly normal mood and affect.  Speech fluent and  appropriate.  I have personally reviewed the following:   Today's Data  . Basic metabolic panel unremarkable.  LFTs unremarkable.  Lactic acid within normal limits. . CBC with normal WBC and platelets.  Hemoglobin stable at 10.4.  Scheduled Meds: . apixaban  5 mg Oral BID  . citalopram  40 mg Oral Daily  . fentaNYL  1 patch Transdermal Q72H  . methimazole  5 mg Oral Daily  . OLANZapine  5 mg Oral QHS  . pantoprazole  20 mg Oral BID  . sodium chloride flush  3 mL Intravenous Once   Continuous Infusions: . ceFEPime (MAXIPIME) IV    . metronidazole 500 mg (04/06/20 0944)  . vancomycin 500 mg (04/06/20 1047)    Principal Problem:   Fever Active Problems:   Febrile illness   History of DVT of lower extremity   Muscular dystrophy ([place])   Subclinical hyperthyroidism   Generalized weakness   Lung cancer metastatic to brain ([place])   RUQ pain   Acute metabolic encephalopathy   Anemia in neoplastic disease   LOS: 0 days   How to contact the [place] Attending or Consulting provider [place] or covering provider during after hours [place], for this patient?  1. Check the care team in [facility] and look for a) attending/consulting [facility] provider listed and b) the [facility] team listed 2. Log into [email or web address] and use 's universal password to access. If you do not have the password, please contact the hospital operator. 3. Locate the [facility] provider you are looking for under [facility] and page to a number that you can be directly reached. 4. If you still have difficulty reaching the provider, please page the [facility] (Director on Call) for the Hospitalists listed on amion for assistance.

## 2020-04-06 NOTE — Consult Note (Signed)
Hematology/Oncology Consult note [facility] [phone] [phone]  Patient Care Team: Tracie Harrier, MD as PCP - General (Internal Medicine) Telford Nab, RN as Registered Nurse Sindy Guadeloupe, MD as Consulting Physician (Hematology and Oncology)   Name of the patient: Deborah Nguyen  [id number]  05/30/1961    Reason for consult: Metastatic lung cancer with adrenal and brain metastases   Requesting physician: Dr. 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  She is currently on maintenance Keytruda after she finished 4 cycles of carbotaxol Keytruda which showed response to treatment.  Her last cycle of maintenance Keytruda was given on 03/18/2020.  At baseline patient has chronic back pain and is on as needed oxycodone.  Her Sister [name] has been her primary caregiver.  Patient is also completed whole brain radiation in the past and since she completed that she has been having problems with balance as well as some cognitive decline and mental slowing.  She was able to carry out her ADLs with some assistance and has a slow gait since radiation treatment.Her most recent MRI on 03/25/2020 showed regression of bilateral temporal lobe and basal ganglia lesions and development of 3 new lesions in the right frontal lobe and left frontal lobe and right cerebellar lesion which were 3 to 4 mm in size.  I have discussed this case with radiation oncology and they had recommended conservative management for now given that these lesions were small.    Brought to the hospital for increasing weakness and inability to ambulate.  She was also noted to have a low-grade fever at home.  CT head without contrast showed no acute intracranial abnormality and mild cerebral atrophy with microvascular disease  changes.  T-max on arrival was 38.1.  Infectious work-up including chest x-ray and urinalysis is negative.  Her labs are unremarkable.  Patient reports feeling fatigued which has worsened in the last 1 week  Pain scale- 3   Review of systems- Review of Systems  Constitutional: Positive for malaise/fatigue. Negative for chills, fever and weight loss.       Generalized weakness  HENT: Negative for congestion, ear discharge and nosebleeds.   Eyes: Negative for blurred vision.  Respiratory: Negative for cough, hemoptysis, sputum production, shortness of breath and wheezing.   Cardiovascular: Negative for chest pain, palpitations, orthopnea and claudication.  Gastrointestinal: Negative for abdominal pain, blood in stool, constipation, diarrhea, heartburn, melena, nausea and vomiting.  Genitourinary: Negative for dysuria, flank pain, frequency, hematuria and urgency.  Musculoskeletal: Negative for back pain, joint pain and myalgias.  Skin: Negative for rash.  Neurological: Negative for dizziness, tingling, focal weakness, seizures, weakness and headaches.  Endo/Heme/Allergies: Does not bruise/bleed easily.  Psychiatric/Behavioral: Negative for depression and suicidal ideas. The patient does not have insomnia.     Allergies  Allergen Reactions  . Morphine And Related Anaphylaxis  . Paxil [Paroxetine Hcl] Other (See Comments)    Hallucinations   . Ciprofloxacin Other (See Comments)    Unknown  . Levaquin [Levofloxacin] Other (See Comments)    Unknown   . 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 She has an increase risk of malignant hyperthermia with anesthesia. She should not receive Propofol.   Donne Hazel [Nefazodone] Other (See Comments)  Unknown   . Biaxin [Clarithromycin] Nausea Only  . Erythromycin Nausea Only  . Penicillins Rash    Patient Active Problem List   Diagnosis Date Noted  . Generalized weakness  04/06/2020  . Acute confusion 04/06/2020  . Lung cancer metastatic to brain ([place]) 04/06/2020  . Port-A-Cath in place 01/15/2020  . Muscular dystrophy ([place]) 10/24/2019  . Thyroid nodule 10/24/2019  . Goals of care, counseling/discussion 09/20/2019  . 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 Malignant neoplasm of lower lobe of right lung ([place]) 09/20/2019  . GAD (generalized anxiety disorder) 09/01/2018  . History of kidney stones 09/01/2018  . Subclinical hyperthyroidism 01/24/2017  . Central core myopathy 09/17/2016  . Low serum vitamin D 09/17/2016  . Hx of adenomatous colonic polyps 08/01/2015  . History of DVT of lower extremity 05/23/2015  . Anxiety 05/31/2014  . GERD (gastroesophageal reflux disease) 05/31/2014  . Febrile illness 05/06/2013  . Multinodular goiter (nontoxic) 07/05/2011     Past Medical History:  Diagnosis Date  . Anxiety   . Arthritis   . Chronic kidney disease   . Family history of breast cancer    aunt  . GERD (gastroesophageal reflux disease)   . History of DVT of lower extremity    Left leg  . Lung cancer ([place])    with brain mets  . Lung mass   . Malignant hyperthermia   . 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[place])    mild form - per patient central cord disease  . Muscular dystrophy ([place])   . Muscular dystrophy [facility])      Past Surgical History:  Procedure Laterality Date  . CESAREAN SECTION  11/1985  . COLONOSCOPY N/A 10/27/2015   Procedure: COLONOSCOPY;  Surgeon: Manya Silvas, MD;  Location: [facility] ENDOSCOPY;  Service: Endoscopy;  Laterality: N/A;   NO Propofol - per office  . LITHOTRIPSY    . PORTA CATH INSERTION N/A 11/09/2019   Procedure: PORTA CATH INSERTION;  Surgeon: Algernon Huxley, MD;  Location: [place] CV LAB;  Service: Cardiovascular;  Laterality: N/A;  . PORTA CATH INSERTION N/A 01/23/2020   Procedure: PORTA CATH INSERTION;  Surgeon: Algernon Huxley, MD;  Location: [place] CV LAB;  Service:  Cardiovascular;  Laterality: N/A;  . PORTA CATH INSERTION N/A 02/01/2020   Procedure: PORTA CATH INSERTION;  Surgeon: Algernon Huxley, MD;  Location: [place] CV LAB;  Service: Cardiovascular;  Laterality: N/A;  . TUBAL LIGATION  03/1986    Social History   Socioeconomic History  . Marital status: Divorced    Spouse name: Not on file  . Number of children: Not on file  . Years of education: Not on file  . Highest education level: Not on file  Occupational History  . Not on file  Tobacco Use  . Smoking status: Former Smoker    Packs/day: 1.00    Types: Cigarettes    Quit date: 05/2016    Years since quitting: 3.9  . Smokeless tobacco: Never Used  Substance and Sexual Activity  . Alcohol use: No  . Drug use: No  . Sexual activity: Not Currently  Other Topics Concern  . Not on file  Social History Narrative  . Not on file   Social Determinants of Health   Financial Resource Strain:   . Difficulty of Paying Living Expenses:   Food Insecurity:   . Worried About Charity fundraiser in the Last Year:   . [employer]  in the Last Year:   Transportation Needs:   . Film/video editor (Medical):   Marland Kitchen Lack of Transportation (Non-Medical):   Physical Activity:   . Days of Exercise per Week:   . Minutes of Exercise per Session:   Stress:   . Feeling of Stress :   Social Connections:   . Frequency of Communication with Friends and Family:   . Frequency of Social Gatherings with Friends and Family:   . Attends Religious Services:   . Active Member of Clubs or Organizations:   . Attends Archivist Meetings:   Marland Kitchen Marital Status:   Intimate Partner Violence:   . Fear of Current or Ex-Partner:   . Emotionally Abused:   Marland Kitchen Physically Abused:   . Sexually Abused:      Family History  Problem Relation Age of Onset  . Cancer Father        bladder, lung  . Breast cancer Maternal Aunt 45     Current Facility-Administered Medications:  .  acetaminophen  (TYLENOL) tablet 650 mg, 650 mg, Oral, Q6H PRN **OR** acetaminophen (TYLENOL) suppository 650 mg, 650 mg, Rectal, Q6H PRN, Athena Masse, MD .  apixaban (ELIQUIS) tablet 5 mg, 5 mg, Oral, BID, Athena Masse, MD, 5 mg at 04/06/20 0936 .  ceFEPIme (MAXIPIME) 2 g in sodium chloride 0.9 % 100 mL IVPB, 2 g, Intravenous, Q8H, Hall, Scott A, RPH .  citalopram (CELEXA) tablet 40 mg, 40 mg, Oral, Daily, Samuella Cota, MD .  fentaNYL (DURAGESIC) 12 MCG/HR 1 patch, 1 patch, Transdermal, Q72H, Samuella Cota, MD .  methimazole (TAPAZOLE) tablet 5 mg, 5 mg, Oral, Daily, Athena Masse, MD, 5 mg at 04/06/20 0940 .  metroNIDAZOLE (FLAGYL) IVPB 500 mg, 500 mg, Intravenous, Q8H, Athena Masse, MD, Last Rate: 100 mL/hr at 04/06/20 0944, 500 mg at 04/06/20 0944 .  OLANZapine (ZYPREXA) tablet 5 mg, 5 mg, Oral, QHS, Samuella Cota, MD .  ondansetron [facility]) tablet 4 mg, 4 mg, Oral, Q6H PRN, 4 mg at 04/06/20 0936 **OR** ondansetron (ZOFRAN) injection 4 mg, 4 mg, Intravenous, Q6H PRN, Athena Masse, MD, 4 mg at 04/06/20 0448 .  Oxycodone HCl TABS 10 mg, 10 mg, Oral, Q4H PRN, Samuella Cota, MD .  pantoprazole (PROTONIX) EC tablet 20 mg, 20 mg, Oral, BID, Samuella Cota, MD .  sodium chloride flush (NS) 0.9 % injection 3 mL, 3 mL, Intravenous, Once, Carrie Mew, MD .  vancomycin (VANCOREADY) IVPB 500 mg/100 mL, 500 mg, Intravenous, Q12H, Hart Robinsons A, RPH, Last Rate: 100 mL/hr at 04/06/20 1047, 500 mg at 04/06/20 1047   Physical exam:  Vitals:   04/06/20 0700 04/06/20 0730 04/06/20 0858 04/06/20 1118  BP: 105/63 (!) 106/44 137/70 124/75  Pulse: (!) 55 (!) 55 69 78  Resp: 15 17 16 20   Temp:   98.3 F (36.8 C) 99.3 F (37.4 C)  TempSrc:   Axillary Oral  SpO2: 95% 94% 99% 98%  Weight:      Height:       Physical Exam Constitutional:      General: She is not in acute distress. HENT:     Head: Normocephalic and atraumatic.  Cardiovascular:     Rate and Rhythm: Normal rate  and regular rhythm.     Heart sounds: Normal heart sounds.  Pulmonary:     Effort: Pulmonary effort is normal.     Breath sounds: Normal breath sounds.  Abdominal:  General: Bowel sounds are normal.     Palpations: Abdomen is soft.  Skin:    General: Skin is warm and dry.  Neurological:     General: No focal deficit present.     Mental Status: She is alert and oriented to person, place, and time.        CMP Latest Ref Rng & Units 04/05/2020  Glucose 70 - 99 mg/dL 101(H)  BUN 6 - 20 mg/dL 13  Creatinine 0.44 - 1.00 mg/dL 0.44  Sodium 135 - 145 mmol/L 138  Potassium 3.5 - 5.1 mmol/L 4.5  Chloride 98 - 111 mmol/L 100  CO2 22 - 32 mmol/L 31  Calcium 8.9 - 10.3 mg/dL 10.3  Total Protein 6.5 - 8.1 g/dL 7.2  Total Bilirubin 0.3 - 1.2 mg/dL 0.5  Alkaline Phos 38 - 126 U/L 101  AST 15 - 41 U/L 26  ALT 0 - 44 U/L 24   CBC Latest Ref Rng & Units 04/05/2020  WBC 4.0 - 10.5 K/uL 9.8  Hemoglobin 12.0 - 15.0 g/dL 10.4(L)  Hematocrit 36.0 - 46.0 % 32.8(L)  Platelets 150 - 400 K/uL 339    @IMAGES @  CT Head Wo Contrast  Result Date: 04/06/2020 CLINICAL DATA:  Weakness. EXAM: CT HEAD WITHOUT CONTRAST TECHNIQUE: Contiguous axial images were obtained from the base of the skull through the vertex without intravenous contrast. COMPARISON:  None. FINDINGS: Brain: There is mild cerebral atrophy with widening of the extra-axial spaces and ventricular dilatation. There are areas of decreased attenuation within the white matter tracts of the supratentorial brain, consistent with microvascular disease changes. Vascular: No hyperdense vessel or unexpected calcification. Skull: Normal. Negative for fracture or focal lesion. Sinuses/Orbits: No acute finding. Other: None. IMPRESSION: 1. No acute intracranial abnormality. 2. Mild cerebral atrophy and microvascular disease changes of the supratentorial brain. Electronically Signed   By: Virgina Norfolk M.D.   On: 04/06/2020 01:12   MR Brain W Wo  Contrast  Result Date: 03/25/2020 CLINICAL DATA:  Lung cancer with brain metastasis. Follow-up. EXAM: MRI HEAD WITHOUT AND WITH CONTRAST TECHNIQUE: Multiplanar, multiecho pulse sequences of the brain and surrounding structures were obtained without and with intravenous contrast. CONTRAST:  47mL GADAVIST GADOBUTROL 1 MMOL/ML IV SOLN COMPARISON:  MRI of the brain September 26, 2019. FINDINGS: Brain: No acute infarction, hemorrhage, hydrocephalus or extra-axial collection. Previously described small lesions in the temporal lobes, 2 on the left and 1 on the right, are no longer seen. Previously described lesion in the left posterior basal ganglia is no longer seen. Interval decrease in size of left occipital enhancing lesion, now measuring 2 mm (4 mm on prior). Mild decrease in size of lesion in the right parietal cortex measuring 2 mm (3 mm on prior). Interval development of a 2 mm enhancing lesion in the right superior frontal gyrus and a 6 mm rim enhancing lesion in the right middle frontal gyrus with surrounding vasogenic edema. Interval development of a 3 mm lesion in the deep white matter of the left frontal lobe. Interval development of 4.8 mm enhancing lesion in the right cerebellar hemisphere. Vascular: Normal flow voids. Skull and upper cervical spine: Normal marrow signal. Sinuses/Orbits: Negative. Other: Bilateral mastoid effusions. IMPRESSION: Mixed interval treatment response with regression of bilateral temporal lobe and left posterior basal ganglia enhancing lesions, interval decrease in size of left occipital lobe and right parietal cortex lesions. Interval development of Joe right frontal lobe lesion, 1 left frontal lobe lesion and 1 right cerebellar hemisphere lesion. These  results will be called to the ordering clinician or representative by the Radiologist Assistant, and communication documented in the PACS or [employer]. Electronically Signed   By: Pedro Earls M.D.   On:  03/25/2020 16:21   DG Chest Portable 1 View  Result Date: 04/05/2020 CLINICAL DATA:  Weakness. History of lung cancer. Progressive weakness. EXAM: PORTABLE CHEST 1 VIEW COMPARISON:  Radiograph 12/27/2019. CT 01/28/2020 FINDINGS: Tip of the right chest port in the SVC. Mild elevation of right hemidiaphragm with streaky opacity at the right lung base, corresponding to nodule on CT. Minor streaky left lung base atelectasis. The heart is normal in size. Normal mediastinal contours. No pleural fluid or pneumothorax. No acute osseous abnormalities are seen. IMPRESSION: 1. Mild elevation of the right hemidiaphragm with streaky opacity at the right lung base, corresponding to nodule on CT. 2. Minor streaky left lung base atelectasis. Electronically Signed   By: Keith Rake M.D.   On: 04/05/2020 23:55    Assessment and plan- Patient is a 59 y.o. 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  Patient's recent brain MRI on 03/25/2020 showed mixed response but resolution of some lesions but she was noted to have 3 new lesions which were between 2 to 4 mm in size.  Radiation oncology had recommended conservative management without repeat radiation at this time.  Given the patient is having weakness and difficulty ambulating it may be reasonable to start her on Decadron 4 mg twice daily.  It may help the small degree of vasogenic edema that she had on her prior MRI and we also resulted in some improvement of her fatigue.  2.  At baseline patient does have a high degree of emotional distress and symptom burden may be from anxiety and gait imbalance as well as cognitive decline she has had after whole brain radiation treatment.  She is also on celexa for depression. For now we will plan with regards to her lung cancer was to continue single agent Keytruda since recent scans on 01/28/2020 and showed continued response to treatment  systemically..  Immunotherapy also has some degree of CNS penetration and I was planning to repeat her MRI in 2 months time  3. Disposition- OOB/PT/OT    Visit Diagnosis 1. Fever, unspecified fever cause   2. Generalized weakness   3. RUQ pain     Dr. Randa Evens, MD, MPH [facility] [phone] 04/06/2020  7:04 PM

## 2020-04-06 NOTE — Plan of Care (Signed)

## 2020-04-06 NOTE — Progress Notes (Signed)
This patient has a fentanyl patch order. I assumed that she came in with a patch on and it was put on today according to the home medication list. Message to RN [facility]) to confirm if patient had patch on currently and when it was put on. Malka confirmed that patch needed to be changed.

## 2020-04-06 NOTE — Plan of Care (Signed)
Pt admitted today.  Mildly confused.  Answers appropriately. VSS. Zofran given for nausea once with improvement.  Oxycodone given for chronic back pain with good effect.  Voids without difficulty.

## 2020-04-06 NOTE — ED Notes (Signed)
Bladder scan = 533 ml.

## 2020-04-06 NOTE — Progress Notes (Signed)
Pharmacy Antibiotic Note  Deborah Nguyen is a 59 y.o. female admitted on 04/05/2020 with Fever (fuo) on chemo.  Pharmacy has been consulted for Cefepime and Vancomycin dosing.  Plan: Cefepime 2gm IV q8hrs  Vancomycin 500 mg IV Q 12 hrs. Goal AUC 400-550. (pt did not receive a full loading dose therefore will start 2nd dose a bit early) Expected AUC: 444.5, Css min 13.7 SCr used: 0.8   Height: 5\' 1"  (154.9 cm) Weight: 60.3 kg (133 lb) IBW/kg (Calculated) : 47.8  Temp (24hrs), Avg:99.7 F (37.6 C), Min:99.1 F (37.3 C), Max:100.6 F (38.1 C)  Recent Labs  Lab 04/05/20 2308 04/06/20 0120  WBC 9.8  --   CREATININE 0.44  --   LATICACIDVEN  --  1.1    Estimated Creatinine Clearance: 63.1 mL/min (by C-G formula based on SCr of 0.44 mg/dL).    Allergies  Allergen Reactions  . Paxil [Paroxetine Hcl] Other (See Comments)    Hallucinations   . Ciprofloxacin Other (See Comments)    Unknown  . Levaquin [Levofloxacin] Other (See Comments)    Unknown   . Morphine And Related Other (See Comments)  . 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 She has an increase risk of malignant hyperthermia with anesthesia. She should not receive Propofol.   Donne Hazel [Nefazodone] Other (See Comments)    Unknown   . Biaxin [Clarithromycin] Nausea Only  . Erythromycin Nausea Only  . Penicillins Rash    Antimicrobials this admission:   >>    >>   Dose adjustments this admission:   Microbiology results:  BCx:   UCx:    Sputum:    MRSA PCR:   Thank you for allowing pharmacy to be a part of this patient's care.  Hart Robinsons A 04/06/2020 6:19 AM

## 2020-04-06 NOTE — H&P (Signed)
History and Physical    Deborah Nguyen HBZ:[id number] DOB: [birth year] DOA: 04/05/2020  PCP: Tracie Harrier, MD   Patient coming from: Home  I have personally briefly reviewed patient's old medical records in [place]  Chief Complaint: Generalized weakness, confusion  HPI: Deborah Nguyen is a 58 y.o. 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  She has had novomiting or diarrhea, no dysuria.  Denies headache or neck pain.  Denied cough or shortness of breath.  Patient's son at bedside contributed to the history and he said that his mother sounds like it takes a long time to answer questions when usually she is short. ED Course: In the emergency room she had a temperature of 100.6 with otherwise normal vitals.  Blood work unremarkable.  Covid and flu test negative chest x-ray showed streaky opacity right lung base.  Head CT with no acute intracranial findings.  Urinalysis unremarkable.  Patient started on broad-spectrum antibiotics in the ER for fever of unknown source.  Hospitalist consulted for admission.  Review of Systems: As per HPI otherwise 10 point review of systems negative.    Past Medical History:  Diagnosis Date  . Anxiety   . Arthritis   . Chronic kidney disease   . Family history of breast cancer    aunt  . GERD (gastroesophageal reflux disease)   . History of DVT of lower extremity    Left leg  . Lung cancer ([place])    with brain mets  . Lung mass   . Malignant hyperthermia   . 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[place])    mild form - per patient central cord disease  . Muscular  dystrophy ([place])   . Muscular dystrophy [facility])     Past Surgical History:  Procedure Laterality Date  . CESAREAN SECTION  11/1985  . COLONOSCOPY N/A 10/27/2015   Procedure: COLONOSCOPY;  Surgeon: Manya Silvas, MD;  [facility] END[facility]COPY;  Service: Endoscopy;  Laterality: N/A;   NO Propofol - per office  . LITHOTRIPSY    . PORTA CATH INSERTION N/A 11/09/2019   Procedure: PORTA CATH INSERTION;  Surgeon: Algernon Huxley, MD;  Location: [place] CV LAB;  Service: Cardiovascular;  Laterality: N/A;  . PORTA CATH INSERTION N/A 01/23/2020   Procedure: PORTA CATH INSERTION;  Surgeon: Algernon Huxley, MD;  Location: [place] CV LAB;  Service: Cardiovascular;  Laterality: N/A;  . PORTA CATH INSERTION N/A 02/01/2020   Procedure: PORTA CATH INSERTION;  Surgeon: Algernon Huxley, MD;  Location: [place] CV LAB;  Service: Cardiovascular;  Laterality: N/A;  . TUBAL LIGATION  03/1986     reports that she quit smoking about 3 years ago. Her smoking use included cigarettes. She smoked 1.00 pack per day. She has never used smokeless tobacco. She reports that she does not drink alcohol or use drugs.  Allergies  Allergen Reactions  . Paxil [Paroxetine Hcl] Other (See Comments)    Hallucinations   . Ciprofloxacin Other (See Comments)    Unknown  . Levaquin [Levofloxacin] Other (See Comments)    Unknown   . Morphine And Related Other (See Comments)  .  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 She has an increase risk of malignant hyperthermia with anesthesia. She should not receive Propofol.   Donne  [Nefazodone] Other (See Comments)    Unknown   . Biaxin [Clarithromycin] Nausea Only  . Erythromycin Nausea Only  . Penicillins Rash    Family History  Problem Relation Age of Onset  . Cancer Father        bladder, lung  . Breast cancer Maternal Aunt 45     Prior to Admission medications   Medication Sig Start Date End Date  Taking? Authorizing Provider  acetaminophen (TYLENOL) 325 MG tablet Take 650 mg by mouth every 6 (six) hours as needed.    [provider]  ALPRAZolam Duanne Moron) 0.25 MG tablet Take 0.25 mg by mouth at bedtime as needed for anxiety.    [provider]  azithromycin (ZITHROMAX) 250 MG tablet Take 1 tablet (250 mg total) by mouth daily. 02/03/20   Paulette Blanch, MD  citalopram (CELEXA) 40 MG tablet Take 1 tablet (40 mg total) by mouth daily. 12/11/19   Borders, Kirt Boys, NP  ELIQUIS 5 MG TABS tablet TAKE 1 TABLET BY MOUTH TWICE A DAY 03/17/20   Sindy Guadeloupe, MD  fentaNYL (DURAGESIC) 12 MCG/HR PLACE 1 PATCH ONTO THE SKIN EVERY 3 DAYS 03/25/20   Jacquelin Hawking, NP  fluticasone (FLONASE) 50 MCG/ACT nasal spray Place 2 sprays into both nostrils daily as needed.     [provider]  furosemide (LASIX) 20 MG tablet Take 1 tablet (20 mg total) by mouth as needed. 02/08/20   Sindy Guadeloupe, MD  lidocaine-prilocaine (EMLA) cream Apply to affected area once 11/06/19   Sindy Guadeloupe, MD  magic mouthwash SOLN Take 5 mLs by mouth 3 (three) times daily as needed for mouth pain. 10/19/19   Sindy Guadeloupe, MD  methimazole (TAPAZOLE) 5 MG tablet Take 5 mg by mouth daily.    [provider]  nystatin (MYCOSTATIN) 100000 UNIT/ML suspension Take 5 mLs (500,000 Units total) by mouth 4 (four) times daily. Swish and spit. 10/08/19   Sindy Guadeloupe, MD  OLANZapine (ZYPREXA) 10 MG tablet TAKE ONE TABLET BY MOUTH AT BEDTIME Patient taking differently: 5 mg.  02/15/20   Jacquelin Hawking, NP  ondansetron (ZOFRAN) 8 MG tablet Take 1 tablet (8 mg total) by mouth 2 (two) times daily as needed for refractory nausea / vomiting. Start on day 3 after carboplatin chemo. 12/17/19   Sindy Guadeloupe, MD  ondansetron (ZOFRAN-ODT) 8 MG disintegrating tablet Take 8 mg by mouth 3 (three) times daily. 01/16/20   [provider]  Oxycodone HCl 10 MG TABS Take 1 tablet (10 mg total) by mouth every 4 (four) hours  as needed. 03/25/20   Borders, Kirt Boys, NP  pantoprazole (PROTONIX) 20 MG tablet TAKE 1 TABLET BY MOUTH 2 TIMES DAILY 02/27/20   Sindy Guadeloupe, MD  prochlorperazine (COMPAZINE) 10 MG tablet Take 1 tablet (10 mg total) by mouth every 6 (six) hours as needed (Nausea or vomiting). 11/06/19   Sindy Guadeloupe, MD  sucralfate (CARAFATE) 1 g tablet Take 1 tablet (1 g total) by mouth 3 (three) times daily. Dissolve in 3-4 tbsp warm water, swish and swallow. 10/30/19   Noreene Filbert, MD  vitamin B-12 (CYANOCOBALAMIN) 1000 MCG tablet Take 1 tablet (1,000 mcg total) by mouth daily. 10/19/19   Sindy Guadeloupe, MD  VITAMIN D, CHOLECALCIFEROL, PO Take 5,000 Units by mouth.    [provider]  pantoprazole (PROTONIX) 20 MG tablet TAKE 1 TABLET BY MOUTH 2 TIMES DAILY 12/18/19   Sindy Guadeloupe, MD    Physical Exam: Vitals:   04/05/20 2230 04/05/20 2358 04/06/20 0000 04/06/20 0100  BP: 104/60  116/64 (!) 117/35  Pulse: 70  75 73  Resp: (!) 22  (!) 23 (!) 22  Temp:  (!) 100.6 F (38.1 C)    TempSrc:  Rectal    SpO2: 96%  93% 95%  Weight:      Height:         Vitals:   04/05/20 2230 04/05/20 2358 04/06/20 0000 04/06/20 0100  BP: 104/60  116/64 (!) 117/35  Pulse: 70  75 73  Resp: (!) 22  (!) 23 (!) 22  Temp:  (!) 100.6 F (38.1 C)    TempSrc:  Rectal    SpO2: 96%  93% 95%  Weight:      Height:        Constitutional: Alert and awake, oriented x3, not in any acute distress. Eyes: PERLA, EOMI, irises appear normal, anicteric sclera,  ENMT: external ears and nose appear normal, normal hearing             Lips appears normal, oropharynx mucosa, tongue, posterior pharynx appear normal  Neck: neck appears normal, no masses, normal ROM, no thyromegaly, no JVD  CVS: S1-S2 clear, no murmur rubs or gallops,  , no carotid bruits, pedal pulses palpable, No LE edema Respiratory:  decreased bilaterally, no wheezing, rales or rhonchi. Respiratory effort normal. No accessory muscle use.  Abdomen: soft  nontender, nondistended, normal bowel sounds, no hepatosplenomegaly, no hernias Musculoskeletal: : no cyanosis, clubbing , no contractures or atrophy Neuro: Cranial nerves II-XII intact, sensation, reflexes normal, strength Psych: judgement and insight appear normal, stable mood and affect,  Skin: no rashes or lesions or ulcers, no induration or nodules   Labs on Admission: I have personally reviewed following labs and imaging studies  CBC: Recent Labs  Lab 04/05/20 2308  WBC 9.8  HGB 10.4*  HCT 32.8*  MCV 98.2  PLT 299   Basic Metabolic Panel: Recent Labs  Lab 04/05/20 2308  NA 138  K 4.5  CL 100  CO2 31  GLUCOSE 101*  BUN 13  CREATININE 0.44  CALCIUM 10.3   GFR: Estimated Creatinine Clearance: 63.1 mL/min (by C-G formula based on SCr of 0.44 mg/dL). Liver Function Tests: Recent Labs  Lab 04/05/20 2308  AST 26  ALT 24  ALKPHOS 101  BILITOT 0.5  PROT 7.2  ALBUMIN 2.9*   No results for input(s): LIPASE, AMYLASE in the last 168 hours. No results for input(s): AMMONIA in the last 168 hours. Coagulation Profile: No results for input(s): INR, PROTIME in the last 168 hours. Cardiac Enzymes: Recent Labs  Lab 04/05/20 2308  CKTOTAL 30*   BNP (last 3 results) No results for input(s): PROBNP in the last 8760 hours. HbA1C: No results for input(s): HGBA1C in the last 72 hours. CBG: No results for input(s): GLUCAP in the last 168 hours. Lipid Profile: No results for input(s): CHOL, HDL, LDLCALC, TRIG, CHOLHDL, LDLDIRECT in the last 72 hours. Thyroid Function Tests: No results for input(s): TSH, T4TOTAL, FREET4, T3FREE, THYROIDAB in the last 72 hours. Anemia Panel: No results for input(s): VITAMINB12, FOLATE, FERRITIN, TIBC, IRON, RETICCTPCT in the last 72 hours. Urine analysis:    Component Value Date/Time   COLORURINE YELLOW (  A) 04/05/2020 2359   APPEARANCEUR CLEAR (A) 04/05/2020 2359   APPEARANCEUR Clear 11/07/2013 2128   LABSPEC 1.016 04/05/2020 2359    LABSPEC 1.023 11/07/2013 2128   PHURINE 6.0 04/05/2020 2359   GLUCOSEU NEGATIVE 04/05/2020 2359   GLUCOSEU Negative 11/07/2013 2128   HGBUR SMALL (A) 04/05/2020 2359   BILIRUBINUR NEGATIVE 04/05/2020 2359   BILIRUBINUR Negative 11/07/2013 2128   [place] 04/05/2020 2359   PROTEINUR NEGATIVE 04/05/2020 2359   NITRITE NEGATIVE 04/05/2020 [place] 04/05/2020 2359   LEUKOCYTESUR Negative 11/07/2013 2128    Radiological Exams on Admission: CT Head Wo Contrast  Result Date: 04/06/2020 CLINICAL DATA:  Weakness. EXAM: CT HEAD WITHOUT CONTRAST TECHNIQUE: Contiguous axial images were obtained from the base of the skull through the vertex without intravenous contrast. COMPARISON:  None. FINDINGS: Brain: There is mild cerebral atrophy with widening of the extra-axial spaces and ventricular dilatation. There are areas of decreased attenuation within the white matter tracts of the supratentorial brain, consistent with microvascular disease changes. Vascular: No hyperdense vessel or unexpected calcification. Skull: Normal. Negative for fracture or focal lesion. Sinuses/Orbits: No acute finding. Other: None. IMPRESSION: 1. No acute intracranial abnormality. 2. Mild cerebral atrophy and microvascular disease changes of the supratentorial brain. Electronically Signed   By: Virgina Norfolk M.D.   On: 04/06/2020 01:12   DG Chest Portable 1 View  Result Date: 04/05/2020 CLINICAL DATA:  Weakness. History of lung cancer. Progressive weakness. EXAM: PORTABLE CHEST 1 VIEW COMPARISON:  Radiograph 12/27/2019. CT 01/28/2020 FINDINGS: Tip of the right chest port in the SVC. Mild elevation of right hemidiaphragm with streaky opacity at the right lung base, corresponding to nodule on CT. Minor streaky left lung base atelectasis. The heart is normal in size. Normal mediastinal contours. No pleural fluid or pneumothorax. No acute osseous abnormalities are seen. IMPRESSION: 1. Mild elevation of the  right hemidiaphragm with streaky opacity at the right lung base, corresponding to nodule on CT. 2. Minor streaky left lung base atelectasis. Electronically Signed   By: Keith Rake M.D.   On: 04/05/2020 23:55    EKG: Independently reviewed.   Assessment/Plan Principal Problem:   Generalized weakness   Febrile illness   Acute confusion -Suspect acute infection, source unknown, though her symptoms may be related to her malignancy -Chest x-ray not diagnostic of pneumonia, urinalysis unremarkable.  Physical exam does not support diagnosis of meningitis -Agree with broad-spectrum antibiotics administered in the ER of cefepime, vancomycin and metronidazole pending preliminary cultures -Consider further imaging to identify source -Consider ID consult to assist with management and need for continued antibiotics -IV hydration    History of DVT of lower extremity -Continue Eliquis pending med rec    Muscular dystrophy ([place]) -Increase nursing assistance    Subclinical hyperthyroidism -Continue methimazole pending med rec    Lung cancer metastatic to brain and adrenals [facility]) -Patient followed by Dr. Janese Banks.  On Keytruda cycle 3    DVT prophylaxis: Eliquis Code Status: full code  Family Communication:  none  Disposition Plan: Back to previous home environment Consults called: none  Status:obs    Athena Masse MD [facility]     04/06/2020, 2:04 AM

## 2020-04-07 ENCOUNTER — Telehealth: Payer: Self-pay | Admitting: *Deleted

## 2020-04-07 DIAGNOSIS — Z7189 Other specified counseling: Secondary | ICD-10-CM | POA: Diagnosis not present

## 2020-04-07 DIAGNOSIS — D630 Anemia in neoplastic disease: Secondary | ICD-10-CM

## 2020-04-07 DIAGNOSIS — C7931 Secondary malignant neoplasm of brain: Secondary | ICD-10-CM | POA: Diagnosis not present

## 2020-04-07 DIAGNOSIS — R530 Neoplastic (malignant) related fatigue: Secondary | ICD-10-CM | POA: Diagnosis not present

## 2020-04-07 DIAGNOSIS — R531 Weakness: Secondary | ICD-10-CM

## 2020-04-07 LAB — BASIC METABOLIC PANEL
Anion gap: 6 (ref 5–15)
BUN: 13 mg/dL (ref 6–20)
CO2: 28 mmol/L (ref 22–32)
Calcium: 10.2 mg/dL (ref 8.9–10.3)
Chloride: 106 mmol/L (ref 98–111)
Creatinine, Ser: 0.39 mg/dL — ABNORMAL LOW (ref 0.44–1.00)
GFR calc Af Amer: >60 mL/min (ref >60)
GFR calc non Af Amer: >60 mL/min (ref >60)
Glucose, Bld: 155 mg/dL — ABNORMAL HIGH (ref 70–99)
Potassium: 4.2 mmol/L (ref 3.5–5.1)
Sodium: 140 mmol/L (ref 135–145)

## 2020-04-07 LAB — GLUCOSE, CAPILLARY
Glucose-Capillary: 128 mg/dL — ABNORMAL HIGH (ref 70–99)
Glucose-Capillary: 141 mg/dL — ABNORMAL HIGH (ref 70–99)
Glucose-Capillary: 161 mg/dL — ABNORMAL HIGH (ref 70–99)

## 2020-04-07 MED ORDER — DEXAMETHASONE 4 MG PO TABS
4.0000 mg | ORAL_TABLET | Freq: Two times a day (BID) | ORAL | Status: DC
  Administered 2020-04-07 – 2020-04-10 (×6): 4 mg via ORAL
  Filled 2020-04-07 (×8): qty 1

## 2020-04-07 NOTE — Progress Notes (Signed)
PROGRESS NOTE  Deborah Nguyen DDU:[id number] DOB: 08-22-1961 DOA: 04/05/2020 PCP: Tracie Harrier, MD  Brief History   59 year old woman PMH stage IV squamous cell carcinoma of the lung with metastatic disease to adrenal glands and brain currently on chemotherapy, presented with progressive weakness, inability to ambulate, worsening confusion making care at home difficult. Fever reported at home. Admitted for fever without a source in the context of malignancy and ongoing chemotherapy.  A & P  Fever without a source.  Right upper quadrant pain.  Urinalysis unremarkable. Blood culture pending. Chest x-ray no acute disease. --Etiology and significance unclear.  Continue antibiotics.  Follow-up culture data, currently no growth to date.  Right upper quadrant pain has resolved and LFTs and ultrasound were unremarkable.    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 No XRT planned per last oncology note. --Dr. Janese Banks recommended Decadron, will change to p.o.  Generalized weakness with inability to ambulate --PT evaluation pending  Acute metabolic encephalopathy.  Chart reports patient's been having confusion at home.  CT head negative. --Appears mild.  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  PMH DVT left leg --Continue apixaban  Chronic fatigue  Chronic back pain not felt to be related to malignancy --Continue fentanyl patch  Muscular dystrophy  Cigarette smoker  Disposition Plan:   Status is: Observation  The patient will require care spanning > 2 midnights and should be moved to inpatient because: IV treatments appropriate due to intensity of illness or inability to take PO   Patient presents with fever without a source in the context of metastatic lung cancer on chemotherapy.  She appears ill.  She will require empiric antibiotics, follow-up culture data.  Overall improving, if  remains afebrile culture data is -5/4, can probably discharge.  Await therapy recommendations.  Dispo: The patient is from: Home              Anticipated d/c is to: Home              Anticipated d/c date is: 5/4              Patient currently is not medically stable to d/c.  DVT prophylaxis: apixaban Code Status: DNR  Family Communication:     Murray Hodgkins, MD  [facility] Direct contact: see [email or web address] (further directions at bottom of note if needed) 7PM-7AM contact night coverage as at bottom of note 04/07/2020, 12:24 PM  LOS: 1 day   Significant Hospital Events   . 5/1 admitted for fever without a source   Consults:  . Oncology for 5/3   Procedures:  .   Significant Diagnostic Tests:   Chest x-ray no acute disease.  Urinalysis negative.   Micro Data:  . Blood culture pending   Antimicrobials:  . Cefepime 5/2 > . Metronidazole 5/2 > . Vancomycin 5/2 >  Interval History/Subjective  Feels better today.  No nausea or vomiting or right upper quadrant pain.  Objective   Vitals:  Vitals:   04/06/20 2318 04/07/20 0835  BP: (!) 108/58 119/66  Pulse: (!) 54 (!) 54  Resp: 16 18  Temp: 98.4 F (36.9 C) 98 F (36.7 C)  SpO2: 96% 98%    Exam:  Constitutional.  Appears calm, comfortable. Respiratory.  Clear to auscultation bilaterally.  No wheezes, rales or rhonchi.  Normal respiratory effort. Cardiovascular.  Regular rate and rhythm.  No murmur, rub or gallop. Abdomen.  Soft, nontender,  nondistended right upper quadrant. Psychiatric.  Grossly normal mood and affect.  Speech fluent and appropriate.  I have personally reviewed the following:   Today's Data  . CBG stable, BMP unremarkable  Scheduled Meds: . apixaban  5 mg Oral BID  . Chlorhexidine Gluconate Cloth  6 each Topical Daily  . citalopram  40 mg Oral Daily  . dexamethasone  4 mg Oral Q12H  . fentaNYL  1 patch Transdermal Q72H  . methimazole  5 mg Oral Daily  . OLANZapine  5 mg Oral QHS    . pantoprazole  20 mg Oral BID  . sodium chloride flush  3 mL Intravenous Once   Continuous Infusions: . sodium chloride Stopped (04/06/20 1826)  . ceFEPime (MAXIPIME) IV 2 g (04/07/20 1205)  . vancomycin 500 mg (04/07/20 1050)    Principal Problem:   Fever Active Problems:   Febrile illness   History of DVT of lower extremity   Muscular dystrophy ([place])   Subclinical hyperthyroidism   Generalized weakness   Lung cancer metastatic to brain ([place])   RUQ pain   Acute metabolic encephalopathy   Anemia in neoplastic disease   Neoplastic malignant related fatigue   Brain metastases ([place])   LOS: 1 day   How to contact the [facility] Attending or Consulting provider 7A - 7P or covering provider during after hours [place], for this patient?  1. Check the care team in [facility] and look for a) attending/consulting TRH provider listed and b) the [facility] team listed 2. Log into [email or web address] and use [place]'s universal password to access. If you do not have the password, please contact the hospital operator. 3. Locate the [facility] provider you are looking for under [facility] and page to a number that you can be directly reached. 4. If you still have difficulty reaching the provider, please page the [facility] (Director on Call) for the Hospitalists listed on amion for assistance.

## 2020-04-07 NOTE — TOC Initial Note (Signed)
Transition of Care [facility]) - Initial/Assessment Note    Patient Details  Name: Deborah Nguyen MRN: [id number] Date of Birth: [birth year]  Transition of Care [facility]) CM/SW Contact:    Magnus Ivan, LCSW Phone Number: 04/07/2020, 1:12 PM  Clinical Narrative:                Per notes, patient is disoriented. CSW called patient's sister, [name]. Completed Readmission Screen. [name] reported patient lives alone and [name] provides transportation. PCP is Dr. Ginette Pitman. Pharmacy is [facility] in [place]. Patient has a walker, hospital bed, shower chair, and BSC at home. Patient has never been to a SNF. [name] reported patient has used HH short term in the past, but could not recall which agnecy was used. CSW will continue to follow.   Expected Discharge Plan: Home/Self Care Barriers to Discharge: Continued Medical Work up   Patient Goals and CMS Choice        Expected Discharge Plan and Services Expected Discharge Plan: Home/Self Care       Living arrangements for the past 2 months: Single Family Home                                      Prior Living Arrangements/Services Living arrangements for the past 2 months: Single Family Home Lives with:: Self Patient language and need for interpreter reviewed:: Yes        Need for Family Participation in Patient Care: Yes (Comment) Care giver support system in place?: Yes (comment) Current home services: DME Criminal Activity/Legal Involvement Pertinent to Current Situation/Hospitalization: No - Comment as needed  Activities of Daily Living Home Assistive Devices/Equipment: Shower chair with back, Other (Comment)(BSC and 4xwheels walker with a sit.) ADL Screening (condition at time of admission) Patient's cognitive ability adequate to safely complete daily activities?: Yes Is the patient deaf or have difficulty hearing?: No Does the patient have difficulty seeing, even when wearing glasses/contacts?: No Does the patient have  difficulty concentrating, remembering, or making decisions?: Yes Patient able to express need for assistance with ADLs?: Yes Does the patient have difficulty dressing or bathing?: Yes Independently performs ADLs?: No Communication: Independent Dressing (OT): Needs assistance Grooming: Needs assistance Feeding: Independent Bathing: Needs assistance Toileting: Needs assistance In/Out Bed: Needs assistance(Pt uses a walker at home) Walks in Home: Independent with device (comment) Does the patient have difficulty walking or climbing stairs?: Yes Weakness of Legs: Both Weakness of Arms/Hands: Both  Permission Sought/Granted                  Emotional Assessment       Orientation: : Fluctuating Orientation (Suspected and/or reported Sundowners) Alcohol / Substance Use: Not Applicable Psych Involvement: No (comment)  Admission diagnosis:  Generalized weakness [R53.1] Fever, unspecified fever cause [R50.9] Fever [R50.9] Patient Active Problem List   Diagnosis Date Noted  . Generalized weakness 04/06/2020  . Lung cancer metastatic to brain ([place]) 04/06/2020  . Fever 04/06/2020  . RUQ pain 04/06/2020  . Acute metabolic encephalopathy 32/67/1245  . 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 Neoplastic malignant related fatigue   . Brain metastases ([place])   . Port-A-Cath in place 01/15/2020  . Muscular dystrophy ([place]) 10/24/2019  . Thyroid nodule 10/24/2019  . Goals of care, counseling/discussion 09/20/2019  . 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 Malignant neoplasm of lower lobe of right lung ([place]) 09/20/2019  .  GAD (generalized anxiety disorder) 09/01/2018  . History of kidney stones 09/01/2018  . Subclinical hyperthyroidism 01/24/2017  . Central core myopathy 09/17/2016  . Low serum vitamin D 09/17/2016  . Hx of adenomatous colonic polyps 08/01/2015  . History of DVT of lower extremity 05/23/2015  . Anxiety 05/31/2014  . GERD  (gastroesophageal reflux disease) 05/31/2014  . Febrile illness 05/06/2013  . Multinodular goiter (nontoxic) 07/05/2011   PCP:  Tracie Harrier, MD Pharmacy:   [place], [place] - [address] [place] [address] Phone: [phone] Fax: [phone]     Social Determinants of Health (SDOH) Interventions    Readmission Risk Interventions Readmission Risk Prevention Plan 04/07/2020  Transportation Screening Complete  PCP or Specialist Appt within 3-5 Days Complete  HRI or Home Care Consult Complete  Medication Review (RN Care Manager) Complete  Some recent data might be hidden

## 2020-04-07 NOTE — Telephone Encounter (Signed)
Dr Janese Banks called and spoke to [place]. Pt is probably going to nursing facility. Waiting to see for sure

## 2020-04-07 NOTE — Telephone Encounter (Addendum)
Sister [name] called  Asking what is to happen next in patient care. She is in hospital and they told her that she will be going to a facility from there. She is asking for a return call [phone]

## 2020-04-07 NOTE — Progress Notes (Signed)
Hematology/Oncology Consult note [facility]  Telephone:([phone] [phone]  Patient Care Team: Tracie Harrier, MD as PCP - General (Internal Medicine) Telford Nab, RN as Registered Nurse Sindy Guadeloupe, MD as Consulting Physician (Hematology and Oncology)   Name of the patient: Deborah Nguyen  [id number]  1961/11/11   Date of visit: 04/08/20  Interval history- patient is cheerful and upbeat today. She still feels fatigued and has generalized weakness. Has chronic back pain/ right flank pain   Review of systems- Review of Systems  Constitutional: Positive for malaise/fatigue. Negative for chills, fever and weight loss.       Generalized weakness  HENT: Negative for congestion, ear discharge and nosebleeds.   Eyes: Negative for blurred vision.  Respiratory: Negative for cough, hemoptysis, sputum production, shortness of breath and wheezing.   Cardiovascular: Negative for chest pain, palpitations, orthopnea and claudication.  Gastrointestinal: Negative for abdominal pain, blood in stool, constipation, diarrhea, heartburn, melena, nausea and vomiting.  Genitourinary: Negative for dysuria, flank pain, frequency, hematuria and urgency.  Musculoskeletal: Negative for back pain, joint pain and myalgias.  Skin: Negative for rash.  Neurological: Negative for dizziness, tingling, focal weakness, seizures, weakness and headaches.  Endo/Heme/Allergies: Does not bruise/bleed easily.  Psychiatric/Behavioral: Negative for depression and suicidal ideas. The patient does not have insomnia.      Allergies  Allergen Reactions  . Morphine And Related Anaphylaxis  . Paxil [Paroxetine Hcl] Other (See Comments)    Hallucinations   . Ciprofloxacin Other (See Comments)    Unknown  . Levaquin [Levofloxacin] Other (See Comments)    Unknown   . 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 She has an  increase risk of malignant hyperthermia with anesthesia. She should not receive Propofol.   Donne Hazel [Nefazodone] Other (See Comments)    Unknown   . Biaxin [Clarithromycin] Nausea Only  . Erythromycin Nausea Only  . Penicillins Rash     Past Medical History:  Diagnosis Date  . Anxiety   . Arthritis   . Chronic kidney disease   . Family history of breast cancer    aunt  . GERD (gastroesophageal reflux disease)   . History of DVT of lower extremity    Left leg  . Lung cancer ([place])    with brain mets  . Lung mass   . Malignant hyperthermia   . 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 Muscular dystrophy ([place])   . Muscular dystrophy [facility])      Past Surgical History:  Procedure Laterality Date  . CESAREAN SECTION  11/1985  . COLONOSCOPY N/A 10/27/2015   Procedure: COLONOSCOPY;  Surgeon: Manya Silvas, MD;  Location: [facility] ENDOSCOPY;  Service: Endoscopy;  Laterality: N/A;   NO Propofol - per office  . LITHOTRIPSY    . PORTA CATH INSERTION N/A 11/09/2019   Procedure: PORTA CATH INSERTION;  Surgeon: Algernon Huxley, MD;  Location: [place] CV LAB;  Service: Cardiovascular;  Laterality: N/A;  . PORTA CATH INSERTION N/A 01/23/2020   Procedure: PORTA CATH INSERTION;  Surgeon: Algernon Huxley, MD;  Location: [place] CV LAB;  Service: Cardiovascular;  Laterality: N/A;  . PORTA CATH INSERTION N/A 02/01/2020   Procedure: PORTA CATH INSERTION;  Surgeon: Algernon Huxley, MD;  Location: [place] CV LAB;  Service: Cardiovascular;  Laterality: N/A;  . TUBAL LIGATION  03/1986    Social History   Socioeconomic History  . Marital status: Divorced    Spouse name: Not on file  . Number of children: Not on file  . Years of education: Not on file  . Highest education level: Not on file  Occupational History  . Not on file  Tobacco Use  . Smoking status: Former Smoker    Packs/day: 1.00    Types: Cigarettes    Quit date: 05/2016    Years  since quitting: 3.9  . Smokeless tobacco: Never Used  Substance and Sexual Activity  . Alcohol use: No  . Drug use: No  . Sexual activity: Not Currently  Other Topics Concern  . Not on file  Social History Narrative  . Not on file   Social Determinants of Health   Financial Resource Strain:   . Difficulty of Paying Living Expenses:   Food Insecurity:   . Worried About Charity fundraiser in the Last Year:   . Arboriculturist in the Last Year:   Transportation Needs:   . Film/video editor (Medical):   Marland Kitchen Lack of Transportation (Non-Medical):   Physical Activity:   . Days of Exercise per Week:   . Minutes of Exercise per Session:   Stress:   . Feeling of Stress :   Social Connections:   . Frequency of Communication with Friends and Family:   . Frequency of Social Gatherings with Friends and Family:   . Attends Religious Services:   . Active Member of Clubs or Organizations:   . Attends Archivist Meetings:   Marland Kitchen Marital Status:   Intimate Partner Violence:   . Fear of Current or Ex-Partner:   . Emotionally Abused:   Marland Kitchen Physically Abused:   . Sexually Abused:     Family History  Problem Relation Age of Onset  . Cancer Father        bladder, lung  . Breast cancer Maternal Aunt 45     Current Facility-Administered Medications:  .  0.9 %  sodium chloride infusion, , Intravenous, PRN, Samuella Cota, MD, Stopped at 04/06/20 1826 .  acetaminophen (TYLENOL) tablet 650 mg, 650 mg, Oral, Q6H PRN **OR** acetaminophen (TYLENOL) suppository 650 mg, 650 mg, Rectal, Q6H PRN, Athena Masse, MD .  apixaban (ELIQUIS) tablet 5 mg, 5 mg, Oral, BID, Athena Masse, MD, 5 mg at 04/07/20 0825 .  ceFEPIme (MAXIPIME) 2 g in sodium chloride 0.9 % 100 mL IVPB, 2 g, Intravenous, Q8H, Hall, Scott A, RPH, Last Rate: 200 mL/hr at 04/07/20 1205, 2 g at 04/07/20 1205 .  Chlorhexidine Gluconate Cloth 2 % PADS 6 each, 6 each, Topical, Daily, Samuella Cota, MD, 6 each at  04/07/20 1000 .  citalopram (CELEXA) tablet 40 mg, 40 mg, Oral, Daily, Samuella Cota, MD, 40 mg at 04/07/20 0826 .  dexamethasone (DECADRON) tablet 4 mg, 4 mg, Oral, Q12H, Samuella Cota, MD .  fentaNYL (DURAGESIC) 12 MCG/HR 1 patch, 1 patch, Transdermal, Q72H, Samuella Cota, MD, 1 patch at 04/06/20 1717 .  methimazole (TAPAZOLE) tablet 5 mg, 5 mg, Oral, Daily, Judd Gaudier V, MD, 5 mg at 04/07/20 0826 .  OLANZapine (ZYPREXA) tablet 5 mg, 5 mg, Oral, QHS, Samuella Cota, MD, 5 mg at 04/06/20 2139 .  ondansetron (ZOFRAN) tablet 4 mg, 4 mg, Oral, Q6H PRN, 4 mg at 04/06/20 0936 **OR** ondansetron (ZOFRAN) injection 4 mg, 4 mg, Intravenous, Q6H PRN, Athena Masse,  MD, 4 mg at 04/07/20 0824 .  oxyCODONE (Oxy IR/ROXICODONE) immediate release tablet 10 mg, 10 mg, Oral, Q4H PRN, Samuella Cota, MD, 10 mg at 04/07/20 0824 .  pantoprazole (PROTONIX) EC tablet 20 mg, 20 mg, Oral, BID, Samuella Cota, MD, 20 mg at 04/07/20 6967 .  sodium chloride flush (NS) 0.9 % injection 3 mL, 3 mL, Intravenous, Once, Carrie Mew, MD .  vancomycin (VANCOREADY) IVPB 500 mg/100 mL, 500 mg, Intravenous, Q12H, Hart Robinsons A, RPH, Last Rate: 100 mL/hr at 04/07/20 1050, 500 mg at 04/07/20 1050  Physical exam:  Vitals:   04/06/20 1712 04/06/20 2024 04/06/20 2318 04/07/20 0835  BP: (!) 113/53 116/69 (!) 108/58 119/66  Pulse: 65 62 (!) 54 (!) 54  Resp: 16 14 16 18   Temp: 98.6 F (37 C) 98.4 F (36.9 C) 98.4 F (36.9 C) 98 F (36.7 C)  TempSrc: Oral Oral Oral Oral  SpO2: 94% 97% 96% 98%  Weight:      Height:       Physical Exam Constitutional:      General: She is not in acute distress. Cardiovascular:     Rate and Rhythm: Normal rate and regular rhythm.     Heart sounds: Normal heart sounds.  Pulmonary:     Effort: Pulmonary effort is normal.     Breath sounds: Normal breath sounds.  Abdominal:     General: Bowel sounds are normal.     Palpations: Abdomen is soft.  Skin:     General: Skin is warm and dry.  Neurological:     General: No focal deficit present.     Mental Status: She is alert and oriented to person, place, and time.      CMP Latest Ref Rng & Units 04/07/2020  Glucose 70 - 99 mg/dL 155(H)  BUN 6 - 20 mg/dL 13  Creatinine 0.44 - 1.00 mg/dL 0.39(L)  Sodium 135 - 145 mmol/L 140  Potassium 3.5 - 5.1 mmol/L 4.2  Chloride 98 - 111 mmol/L 106  CO2 22 - 32 mmol/L 28  Calcium 8.9 - 10.3 mg/dL 10.2  Total Protein 6.5 - 8.1 g/dL -  Total Bilirubin 0.3 - 1.2 mg/dL -  Alkaline Phos 38 - 126 U/L -  AST 15 - 41 U/L -  ALT 0 - 44 U/L -   CBC Latest Ref Rng & Units 04/05/2020  WBC 4.0 - 10.5 K/uL 9.8  Hemoglobin 12.0 - 15.0 g/dL 10.4(L)  Hematocrit 36.0 - 46.0 % 32.8(L)  Platelets 150 - 400 K/uL 339    @IMAGES @  CT Head Wo Contrast  Result Date: 04/06/2020 CLINICAL DATA:  Weakness. EXAM: CT HEAD WITHOUT CONTRAST TECHNIQUE: Contiguous axial images were obtained from the base of the skull through the vertex without intravenous contrast. COMPARISON:  None. FINDINGS: Brain: There is mild cerebral atrophy with widening of the extra-axial spaces and ventricular dilatation. There are areas of decreased attenuation within the white matter tracts of the supratentorial brain, consistent with microvascular disease changes. Vascular: No hyperdense vessel or unexpected calcification. Skull: Normal. Negative for fracture or focal lesion. Sinuses/Orbits: No acute finding. Other: None. IMPRESSION: 1. No acute intracranial abnormality. 2. Mild cerebral atrophy and microvascular disease changes of the supratentorial brain. Electronically Signed   By: Virgina Norfolk M.D.   On: 04/06/2020 01:12   MR Brain W Wo Contrast  Result Date: 03/25/2020 CLINICAL DATA:  Lung cancer with brain metastasis. Follow-up. EXAM: MRI HEAD WITHOUT AND WITH CONTRAST TECHNIQUE: Multiplanar, multiecho pulse sequences  of the brain and surrounding structures were obtained without and with intravenous  contrast. CONTRAST:  48mL GADAVIST GADOBUTROL 1 MMOL/ML IV SOLN COMPARISON:  MRI of the brain September 26, 2019. FINDINGS: Brain: No acute infarction, hemorrhage, hydrocephalus or extra-axial collection. Previously described small lesions in the temporal lobes, 2 on the left and 1 on the right, are no longer seen. Previously described lesion in the left posterior basal ganglia is no longer seen. Interval decrease in size of left occipital enhancing lesion, now measuring 2 mm (4 mm on prior). Mild decrease in size of lesion in the right parietal cortex measuring 2 mm (3 mm on prior). Interval development of a 2 mm enhancing lesion in the right superior frontal gyrus and a 6 mm rim enhancing lesion in the right middle frontal gyrus with surrounding vasogenic edema. Interval development of a 3 mm lesion in the deep white matter of the left frontal lobe. Interval development of 4.8 mm enhancing lesion in the right cerebellar hemisphere. Vascular: Normal flow voids. Skull and upper cervical spine: Normal marrow signal. Sinuses/Orbits: Negative. Other: Bilateral mastoid effusions. IMPRESSION: Mixed interval treatment response with regression of bilateral temporal lobe and left posterior basal ganglia enhancing lesions, interval decrease in size of left occipital lobe and right parietal cortex lesions. Interval development of Joe right frontal lobe lesion, 1 left frontal lobe lesion and 1 right cerebellar hemisphere lesion. These results will be called to the ordering clinician or representative by the Radiologist Assistant, and communication documented in the PACS or [employer]. Electronically Signed   By: Pedro Earls M.D.   On: 03/25/2020 16:21   DG Chest Portable 1 View  Result Date: 04/05/2020 CLINICAL DATA:  Weakness. History of lung cancer. Progressive weakness. EXAM: PORTABLE CHEST 1 VIEW COMPARISON:  Radiograph 12/27/2019. CT 01/28/2020 FINDINGS: Tip of the right chest port in the SVC. Mild  elevation of right hemidiaphragm with streaky opacity at the right lung base, corresponding to nodule on CT. Minor streaky left lung base atelectasis. The heart is normal in size. Normal mediastinal contours. No pleural fluid or pneumothorax. No acute osseous abnormalities are seen. IMPRESSION: 1. Mild elevation of the right hemidiaphragm with streaky opacity at the right lung base, corresponding to nodule on CT. 2. Minor streaky left lung base atelectasis. Electronically Signed   By: Keith Rake M.D.   On: 04/05/2020 23:55   US Abdomen Limited RUQ  Result Date: 04/06/2020 CLINICAL DATA:  Right upper quadrant pain and nausea. EXAM: ULTRASOUND ABDOMEN LIMITED RIGHT UPPER QUADRANT COMPARISON:  CT 01/28/2020 FINDINGS: Gallbladder: No gallstones or wall thickening visualized. No sonographic Murphy sign noted by sonographer. Common bile duct: Diameter: 8 mm unchanged. Liver: Mild increased parenchymal echogenicity centrally suggesting steatosis. No focal mass. Portal vein is patent on color Doppler imaging with normal direction of blood flow towards the liver. Other: Incidental right renal cyst unchanged. IMPRESSION: No acute findings. Electronically Signed   By: Marin Olp M.D.   On: 04/06/2020 16:24     Assessment and plan- Patient is a 59 y.o. 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  I spoke to patient's Sister [name] today who was concerned that patient was confused yesterday when she called and was not sure who she had called and what was going on.  When I spoke to the patient today she seems alert oriented.  I do feel there is a component of emotional distress  and anxiety here which is sometimes playing a role in patient's confusion.  With regards to brain mets I would continue 4 mg twice daily and she can be discharged on that dose and I will slowly taper this as an outpatient.  2.  Stage IV squamous cell  lung cancer: Treatment will be on hold until rehab.  Is over following which I will repeat CT chest abdomen and pelvis with contrast as well as an MRI.  Depending on her performance status as well as response on her scans I will decide if we can continue palliative Keytruda versus consider hospice.  Patient is young and is tolerating treatment well so far and recent scans in February 2021 showed stable disease.  3.  Fever: Etiology likely noninfectious.  Patient has had low-grade fevers in the past as well.  Work-up so far has been unrevealing.  It would be okay to stop antibiotics on discharge  4.  Disposition: Plan is to send patient to a subacute rehab facility and patient prefers to go to [facility] which is very close to her house.  I will continue to follow her during this time and assess how she is doing at the rehab and make further plans accordingly.   Visit Diagnosis   Dr. Randa Evens, MD, MPH [facility] [phone] 04/07/2020 4:31 PM

## 2020-04-07 NOTE — Progress Notes (Signed)
Physical Therapy Evaluation Patient Details Name: Deborah Nguyen MRN: [id number] DOB: Jun 04, 1961 Today's Date: 04/07/2020   History of Present Illness  Deborah Nguyen is a 59 y.o. 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  She has had no vomiting or diarrhea, no dysuria.  Denies headache or neck pain.  Denied cough or shortness of breath.  Patient's son at bedside contributed to the history and he said that his mother sounds like it takes a long time to answer questions when usually she is short. In the emergency room she had a temperature of 100.6 with otherwise normal vitals.  Blood work unremarkable. Covid and flu test negative chest x-ray showed streaky opacity right lung base.  Head CT with no acute intracranial findings.  Urinalysis unremarkable.  Patient started on broad-spectrum antibiotics in the ER for fever of unknown source and is now admitted for the same. Pt also admitted for acute metabolic enceophalopathy and generalized weakness with inability to ambulate.   Clinical Impression  Pt admitted with above diagnosis. Pt currently with functional limitations due to the deficits listed below (see PT Problem List). Pt able to roll onto right side in bed but requires modA+1 to go from R sidelying to sitting. Once in sitting she requires a few minutes to gain her balance. When returning to bed she requires modA+1 for BLE assist. For transfers pt requires modA+1 from therapist to go from from sitting to standing. Once in standing she requires heavy BUE support on walker and supports the back of her legs on  the bed. She is able to perform some limited marching in place. She is too weak to safely attempt ambulation with therapist at this time. Pt is unsafe to return home at this time. Recommend SNF placement in order for patient to eventually transition safely back home. She is currently refusing SNF placement. Pt will benefit from PT services to address deficits in strength, balance, and mobility in order to return to full function at home.      Follow Up Recommendations SNF    Equipment Recommendations  None recommended by PT;Other (comment)(Pt will have to use walker/rollator after discharge). At time of PT evaluation pt is refusing SNF placement.    Recommendations for Other Services       Precautions / Restrictions Precautions Precautions: Fall Restrictions Weight Bearing Restrictions: No      Mobility  Bed Mobility Overal bed mobility: Needs Assistance Bed Mobility: Supine to Sit;Sit to Supine     Supine to sit: Min assist Sit to supine: Min assist   General bed mobility comments: Pt able to roll onto right side in bed but requires modA+1 to go from R sidelying to sitting. Once in sitting she requires a few minutes to gain her balance. When returning to bed she requires modA+1 for BLE assist  Transfers Overall transfer level: Needs assistance Equipment used: Rolling walker (2 wheeled) Transfers: Sit to/from Stand Sit to Stand: Mod assist         General transfer comment: Pt requires modA+1 from therapist to go from from supine to sitting. Once in standing she requires heavy BUE support  on walker and supports the back of her legs on the bed. She is able to perform some limited marching in place. She is too weak to safely attempt ambulation with therapist at this time.   Ambulation/Gait             General Gait Details: Unable/Unsafe to attempt  Stairs            Wheelchair Mobility    Modified Rankin (Stroke Patients Only)       Balance Overall  balance assessment: Needs assistance Sitting-balance support: No upper extremity supported Sitting balance-Leahy Scale: Fair     Standing balance support: Bilateral upper extremity supported Standing balance-Leahy Scale: Poor Standing balance comment: Pt requires BUE support on walker and min/modA+1 support from PT to maintain balance in standing                             Pertinent Vitals/Pain Pain Assessment: No/denies pain    Home Living Family/patient expects to be discharged to:: Private residence Living Arrangements: Other relatives;Other (Comment)(Lives alone but sister lives next door and can stay 24/7) Available Help at Discharge: Family;Available 24 hours/day Type of Home: Apartment Home Access: Level entry     Home Layout: One level Home Equipment: Walker - 4 wheels;Cane - single point;Bedside commode;Tub bench;Hospital bed;Other (comment)(lift recliner)      Prior Function Level of Independence: Needs assistance   Gait / Transfers Assistance Needed: Pt reports modified independent ambulation with rollator. No falls reported in the last 12 months  ADL's / Homemaking Assistance Needed: Independent with ADLs, sister as well as son/DIL assist with IADLs such as grocery shopping        Hand Dominance   Dominant Hand: Right    Extremity/Trunk Assessment   Upper Extremity Assessment Upper Extremity Assessment: Generalized weakness;RUE deficits/detail RUE Deficits / Details: Significant weakness in shoulder flexion/abduction as well as elbow extension    Lower Extremity Assessment Lower Extremity Assessment: Generalized weakness;RLE deficits/detail RLE Deficits / Details: Pt with 2/5 B hip flexion, 4-/5 B knee extension, and 2/5 B ankle dorsiflexion.        Communication   Communication: No difficulties  Cognition Arousal/Alertness: Awake/alert Behavior During Therapy: WFL for tasks assessed/performed Overall Cognitive Status: Within Functional  Limits for tasks assessed                                        General Comments      Exercises     Assessment/Plan    PT Assessment Patient needs continued PT services  PT Problem List Decreased strength;Decreased activity tolerance;Decreased balance;Decreased mobility       PT Treatment Interventions DME instruction;Functional mobility training;Therapeutic activities;Gait training;Therapeutic exercise;Balance training;Neuromuscular re-education;Patient/family education    PT Goals (Current goals can be found in the Care Plan section)  Acute Rehab PT Goals Patient Stated Goal: Return home at prior functional level PT Goal Formulation: With patient Time For Goal Achievement: 04/21/20 Potential to Achieve Goals: Fair    Frequency Min 2X/week   Barriers to discharge Decreased caregiver support Pt unable/unsafe to ambulate with therapy at this time    Co-evaluation               AM-PAC PT "6 Clicks" Mobility  Outcome Measure Help needed turning from your back to your side while in a flat bed without using bedrails?:  None Help needed moving from lying on your back to sitting on the side of a flat bed without using bedrails?: A Lot Help needed moving to and from a bed to a chair (including a wheelchair)?: A Lot Help needed standing up from a chair using your arms (e.g., wheelchair or bedside chair)?: A Lot Help needed to walk in hospital room?: Total Help needed climbing 3-5 steps with a railing? : Total 6 Click Score: 12    End of Session Equipment Utilized During Treatment: Gait belt Activity Tolerance: Patient tolerated treatment well Patient left: in bed;with call bell/phone within reach;with bed alarm set;Other (comment)(Refuses to get up to recliner)   PT Visit Diagnosis: Unsteadiness on feet (R26.81);Muscle weakness (generalized) (M62.81);Difficulty in walking, not elsewhere classified (R26.2)    Time: 1355-1415 PT Time Calculation (min)  (ACUTE ONLY): 20 min   Charges:   PT Evaluation $PT Eval Low Complexity: 1 Low          Dickey Caamano D Marygrace Sandoval PT, DPT, GCS   Leone Mobley 04/07/2020, 2:47 PM

## 2020-04-08 ENCOUNTER — Inpatient Hospital Stay: Payer: Medicare Other | Admitting: Oncology

## 2020-04-08 ENCOUNTER — Inpatient Hospital Stay: Payer: Medicare Other

## 2020-04-08 LAB — GLUCOSE, CAPILLARY
Glucose-Capillary: 109 mg/dL — ABNORMAL HIGH (ref 70–99)
Glucose-Capillary: 158 mg/dL — ABNORMAL HIGH (ref 70–99)
Glucose-Capillary: 152 mg/dL — ABNORMAL HIGH (ref 70–99)

## 2020-04-08 MED ORDER — FENTANYL 12 MCG/HR TD PT72: 1.0000 | MEDICATED_PATCH | TRANSDERMAL | 0 refills | Status: DC

## 2020-04-08 MED ORDER — DEXAMETHASONE 4 MG PO TABS: 4.0000 mg | ORAL_TABLET | Freq: Two times a day (BID) | ORAL | Status: AC

## 2020-04-08 MED ORDER — OXYCODONE HCL 10 MG PO TABS: 10.0000 mg | ORAL_TABLET | ORAL | 0 refills | Status: DC | PRN

## 2020-04-08 NOTE — NC FL2 (Signed)
[place] LEVEL OF CARE SCREENING TOOL     IDENTIFICATION  Patient Name: Deborah Nguyen Birthdate: 03/30/1961 Sex: female Admission Date (Current Location): 04/05/2020  [place] and [place] Number:  Engineering geologist and Address:  [facility], [address], [place], [place] [address]      Provider Number: [id number]  Attending Physician Name and Address:  Samuella Cota, MD  Relative Name and Phone Number:  Jearld Fenton [phone]    Current Level of Care: Hospital Recommended Level of Care: [place] Prior Approval Number:    Date Approved/Denied:   PASRR Number: [id number]  Discharge Plan: SNF    Current Diagnoses: Patient Active Problem List   Diagnosis Date Noted  . Generalized weakness 04/06/2020  . Lung cancer metastatic to brain ([place]) 04/06/2020  . Fever 04/06/2020  . RUQ pain 04/06/2020  . Acute metabolic encephalopathy 38/18/2993  . 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 Neoplastic malignant related fatigue   . Brain metastases ([place])   . Port-A-Cath in place 01/15/2020  . Muscular dystrophy ([place]) 10/24/2019  . Thyroid nodule 10/24/2019  . Goals of care, counseling/discussion 09/20/2019  . 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 Malignant neoplasm of lower lobe of right lung ([place]) 09/20/2019  . GAD (generalized anxiety disorder) 09/01/2018  . History of kidney stones 09/01/2018  . Subclinical hyperthyroidism 01/24/2017  . Central core myopathy 09/17/2016  . Low serum vitamin D 09/17/2016  . Hx of adenomatous colonic polyps 08/01/2015  . History of DVT of lower extremity 05/23/2015  . Anxiety 05/31/2014  . GERD (gastroesophageal reflux disease) 05/31/2014  . Febrile illness 05/06/2013  . Multinodular goiter (nontoxic) 07/05/2011    Orientation RESPIRATION BLADDER Height & Weight     Self, Time, Situation, Place  Normal Continent Weight: 60.3  kg Height:  5\' 1"  (154.9 cm)  BEHAVIORAL SYMPTOMS/MOOD NEUROLOGICAL BOWEL NUTRITION STATUS      Continent Diet(Regular)  AMBULATORY STATUS COMMUNICATION OF NEEDS Skin   Extensive Assist Verbally Normal                       Personal Care Assistance Level of Assistance  Bathing, Feeding, Dressing Bathing Assistance: Limited assistance Feeding assistance: Limited assistance Dressing Assistance: Limited assistance     Functional Limitations Info  Sight, Hearing, Speech Sight Info: Adequate Hearing Info: Adequate Speech Info: Adequate    SPECIAL CARE FACTORS FREQUENCY  PT (By licensed PT), OT (By licensed OT)                    Contractures Contractures Info: Not present    Additional Factors Info  Code Status, Allergies Code Status Info: DNR Allergies Info: Morphine, Paxil, Cipro, Levaquin, Propofol, Serzone, Biaxin, PCN, Erythromycin           Current Medications (04/08/2020):  This is the current hospital active medication list Current Facility-Administered Medications  Medication Dose Route Frequency Provider Last Rate Last Admin  . 0.9 %  sodium chloride infusion   Intravenous PRN Samuella Cota, MD   Stopped at 04/06/20 1826  . acetaminophen (TYLENOL) tablet 650 mg  650 mg Oral Q6H PRN Athena Masse, MD   650 mg at 04/08/20 0555   Or  . acetaminophen (TYLENOL) suppository 650 mg  650 mg Rectal Q6H PRN Athena Masse, MD      . apixaban Arne Cleveland) tablet 5 mg  5 mg Oral BID Judd Gaudier  V, MD   5 mg at 04/08/20 0853  . Chlorhexidine Gluconate Cloth 2 % PADS 6 each  6 each Topical Daily Samuella Cota, MD   6 each at 04/07/20 1000  . citalopram (CELEXA) tablet 40 mg  40 mg Oral Daily Samuella Cota, MD   40 mg at 04/08/20 5300  . dexamethasone (DECADRON) tablet 4 mg  4 mg Oral Q12H Samuella Cota, MD   4 mg at 04/08/20 5110  . fentaNYL (DURAGESIC) 12 MCG/HR 1 patch  1 patch Transdermal Q72H Samuella Cota, MD   1 patch at 04/06/20 1717   . methimazole (TAPAZOLE) tablet 5 mg  5 mg Oral Daily Athena Masse, MD   5 mg at 04/08/20 0852  . OLANZapine (ZYPREXA) tablet 5 mg  5 mg Oral QHS Samuella Cota, MD   5 mg at 04/07/20 2120  . ondansetron (ZOFRAN) tablet 4 mg  4 mg Oral Q6H PRN Athena Masse, MD   4 mg at 04/06/20 0936   Or  . ondansetron [facility]) injection 4 mg  4 mg Intravenous Q6H PRN Athena Masse, MD   4 mg at 04/07/20 0824  . oxyCODONE (Oxy IR/ROXICODONE) immediate release tablet 10 mg  10 mg Oral Q4H PRN Samuella Cota, MD   10 mg at 04/08/20 1049  . pantoprazole (PROTONIX) EC tablet 20 mg  20 mg Oral BID Samuella Cota, MD   20 mg at 04/08/20 2111  . sodium chloride flush (NS) 0.9 % injection 3 mL  3 mL Intravenous Once Carrie Mew, MD         Discharge Medications: Please see discharge summary for a list of discharge medications.  Relevant Imaging Results:  Relevant Lab Results:   Additional Information ss# [id number]  Shelbie Ammons, RN

## 2020-04-08 NOTE — Discharge Summary (Signed)
Physician Discharge Summary  Deborah Nguyen ZOX:[id number] DOB: [birth year] DOA: 04/05/2020  PCP: Tracie Harrier, MD  Admit date: 04/05/2020 Discharge date: 04/08/2020  Recommendations for Outpatient Follow-up:  1. Ongoing care for cancer.  Started on Decadron per oncology with plans for outpatient taper per Dr. Janese [place].   Follow-up Information    Tracie Harrier, MD Follow up.   Specialty: Internal Medicine Why: Please schedule PCP follow-up within 5-7 days of discharge date. Contact information: [address] [place] [address] [phone]        Sindy Guadeloupe, MD. Schedule an appointment as soon as possible for a visit in 2 week(s).   Specialty: Oncology Contact information: [place] [address] [phone]            Discharge Diagnoses: Principal diagnosis is #1 1. Fever without a source.   2. 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 Generalized weakness with inability to ambulate 4. Acute metabolic encephalopathy 5. 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 PMH DVT left leg 7. Chronic back pain not felt to be related to malignancy 8. Muscular dystrophy  Discharge Condition: improved Disposition: SNF  Diet recommendation: Regular  Filed Weights   04/05/20 2211  Weight: 60.3 kg    History of present illness:  59 year old woman PMH stage IV squamous cell carcinoma of the lung with metastatic disease to adrenal glands and brain currently on chemotherapy, presented with progressive weakness, inability to ambulate, worsening confusion making care at home difficult. Fever reported at home. Admitted for fever without a source in the context of malignancy and ongoing chemotherapy.  Hospital Course:  Patient was treated with empiric antibiotics, clinical course was unremarkable with no recurrent fever and no identification of any infection.   Abdominal pain resolved spontaneously.  Patient seen by oncology in consultation with recommendation to start Decadron.  This will be continued on discharge and closely managed by oncology as an outpatient.  Seen by physical therapy and recommendation for SNF.  Patient agreeable.  Individual issues as below.  Fever without a source.  Right upper quadrant pain.  Urinalysis unremarkable. Blood culture pending. Chest x-ray no acute disease. --Etiology and significance unclear.    No localizing symptoms.  Abdominal pain resolved.  Blood cultures no growth thus far.  Reportedly has had fevers in the past without identification of source.  Favor noninfectious etiology.  No further evaluation suggested.  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 No XRT planned per last oncology note. --Dr. Janese [place] recommended Decadron, will change to p.o. and continue current dosing.  Oncology will taper as an outpatient.  Generalized weakness with inability to ambulate --PT recommended SNF.  Acute metabolic encephalopathy.  Chart reports patient's been having confusion at home.  CT head negative. --Appears mild.  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  PMH DVT left leg --Continue apixaban  Chronic back pain not felt to be related to malignancy --Continue fentanyl patch  Muscular dystrophy  Cigarette smoker  Significant Hospital Events    5/1 admitted for fever without a source  Consults:   Oncology for 5/3  Procedures:   None  Significant Diagnostic Tests:   Chest x-ray no acute disease.  Urinalysis negative.  Micro Data:   Blood culture NGTD 2 days  Antimicrobials:   Cefepime 5/2 > 5/4  Metronidazole 5/2 > 5/3  Vancomycin 5/2 >  5/4  Today's assessment: S: Feels better except for some nausea.  No abdominal pain.  Tolerating diet. O: Vitals:  Vitals:   04/07/20 2347  04/08/20 0747  BP:  123/75  Pulse: (!) 52 (!) 52  Resp:  16  Temp:  98.3 F (36.8 C)  SpO2: 97% 96%    Constitutional:  . Appears calm and comfortable Respiratory:  . CTA bilaterally, no w/r/r.  . Respiratory effort normal.  Cardiovascular:  . RRR, no m/r/g . No LE extremity edema   Abdomen:  . Soft, nontender, nondistended Psychiatric:  . Mental status o Mood, affect appropriate  CBG stable Blood cultures no growth 2 days  Discharge Instructions   Allergies as of 04/08/2020      Reactions   Morphine And Related Anaphylaxis   Paxil [paroxetine Hcl] Other (See Comments)   Hallucinations   Ciprofloxacin Other (See Comments)   Unknown   Levaquin [levofloxacin] Other (See Comments)   Unknown   Propofol Other (See Comments)   She has central core disease which is a form of muscular dystrophy. She has an increase risk of malignant hyperthermia with anesthesia. She should not receive Propofol.    Serzone [nefazodone] Other (See Comments)   Unknown   Biaxin [clarithromycin] Nausea Only   Erythromycin Nausea Only   Penicillins Rash      Medication List    TAKE these medications   acetaminophen 325 MG tablet Commonly known as: TYLENOL Take 650 mg by mouth every 6 (six) hours as needed.   citalopram 40 MG tablet Commonly known as: CELEXA Take 1 tablet (40 mg total) by mouth daily.   Colace 100 MG capsule Generic drug: docusate sodium Take 200 mg by mouth 2 (two) times daily.   dexamethasone 4 MG tablet Commonly known as: DECADRON Take 1 tablet (4 mg total) by mouth every 12 (twelve) hours.   Eliquis 5 MG Tabs tablet Generic drug: apixaban TAKE 1 TABLET BY MOUTH TWICE A DAY   fentaNYL 12 MCG/HR Commonly known as: [place] 1 patch onto the skin every 3 (three) days. What changed: See the new instructions.   furosemide 20 MG tablet Commonly known as: LASIX Take 20 mg by mouth as needed.   methimazole 5 MG tablet Commonly known as: TAPAZOLE Take  5 mg by mouth daily.   MiraLax 17 g packet Generic drug: polyethylene glycol Take 17 g by mouth 2 (two) times daily.   OLANZapine 10 MG tablet Commonly known as: ZYPREXA TAKE ONE TABLET BY MOUTH AT BEDTIME What changed:   how much to take  how to take this  when to take this   ondansetron 8 MG disintegrating tablet Commonly known as: ZOFRAN-ODT Take 8 mg by mouth every 6 (six) hours as needed.   Oxycodone HCl 10 MG Tabs Take 1 tablet (10 mg total) by mouth every 4 (four) hours as needed. What changed: how much to take   pantoprazole 20 MG tablet Commonly known as: PROTONIX TAKE 1 TABLET BY MOUTH 2 TIMES DAILY      Allergies  Allergen Reactions  . Morphine And Related Anaphylaxis  . Paxil [Paroxetine Hcl] Other (See Comments)    Hallucinations   . Ciprofloxacin Other (See Comments)    Unknown  . Levaquin [Levofloxacin] Other (See Comments)    Unknown   . 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 She has an increase risk of malignant hyperthermia with anesthesia. She  should not receive Propofol.   Donne Hazel [Nefazodone] Other (See Comments)    Unknown   . Biaxin [Clarithromycin] Nausea Only  . Erythromycin Nausea Only  . Penicillins Rash    The results of significant diagnostics from this hospitalization (including imaging, microbiology, ancillary and laboratory) are listed below for reference.    Significant Diagnostic Studies: CT Head Wo Contrast  Result Date: 04/06/2020 CLINICAL DATA:  Weakness. EXAM: CT HEAD WITHOUT CONTRAST TECHNIQUE: Contiguous axial images were obtained from the base of the skull through the vertex without intravenous contrast. COMPARISON:  None. FINDINGS: Brain: There is mild cerebral atrophy with widening of the extra-axial spaces and ventricular dilatation. There are areas of decreased attenuation within the white matter tracts of the supratentorial brain, consistent with  microvascular disease changes. Vascular: No hyperdense vessel or unexpected calcification. Skull: Normal. Negative for fracture or focal lesion. Sinuses/Orbits: No acute finding. Other: None. IMPRESSION: 1. No acute intracranial abnormality. 2. Mild cerebral atrophy and microvascular disease changes of the supratentorial brain. Electronically Signed   By: Virgina Norfolk M.D.   On: 04/06/2020 01:12   MR Brain W Wo Contrast  Result Date: 03/25/2020 CLINICAL DATA:  Lung cancer with brain metastasis. Follow-up. EXAM: MRI HEAD WITHOUT AND WITH CONTRAST TECHNIQUE: Multiplanar, multiecho pulse sequences of the brain and surrounding structures were obtained without and with intravenous contrast. CONTRAST:  82mL GADAVIST GADOBUTROL 1 MMOL/ML IV SOLN COMPARISON:  MRI of the brain September 26, 2019. FINDINGS: Brain: No acute infarction, hemorrhage, hydrocephalus or extra-axial collection. Previously described small lesions in the temporal lobes, 2 on the left and 1 on the right, are no longer seen. Previously described lesion in the left posterior basal ganglia is no longer seen. Interval decrease in size of left occipital enhancing lesion, now measuring 2 mm (4 mm on prior). Mild decrease in size of lesion in the right parietal cortex measuring 2 mm (3 mm on prior). Interval development of a 2 mm enhancing lesion in the right superior frontal gyrus and a 6 mm rim enhancing lesion in the right middle frontal gyrus with surrounding vasogenic edema. Interval development of a 3 mm lesion in the deep white matter of the left frontal lobe. Interval development of 4.8 mm enhancing lesion in the right cerebellar hemisphere. Vascular: Normal flow voids. Skull and upper cervical spine: Normal marrow signal. Sinuses/Orbits: Negative. Other: Bilateral mastoid effusions. IMPRESSION: Mixed interval treatment response with regression of bilateral temporal lobe and left posterior basal ganglia enhancing lesions, interval decrease in  size of left occipital lobe and right parietal cortex lesions. Interval development of Joe right frontal lobe lesion, 1 left frontal lobe lesion and 1 right cerebellar hemisphere lesion. These results will be called to the ordering clinician or representative by the Radiologist Assistant, and communication documented in the PACS or [employer]. Electronically Signed   By: Pedro Earls M.D.   On: 03/25/2020 16:21   DG Chest Portable 1 View  Result Date: 04/05/2020 CLINICAL DATA:  Weakness. History of lung cancer. Progressive weakness. EXAM: PORTABLE CHEST 1 VIEW COMPARISON:  Radiograph 12/27/2019. CT 01/28/2020 FINDINGS: Tip of the right chest port in the SVC. Mild elevation of right hemidiaphragm with streaky opacity at the right lung base, corresponding to nodule on CT. Minor streaky left lung base atelectasis. The heart is normal in size. Normal mediastinal contours. No pleural fluid or pneumothorax. No acute osseous abnormalities are seen. IMPRESSION: 1. Mild elevation of the right hemidiaphragm with streaky opacity at the right lung  base, corresponding to nodule on CT. 2. Minor streaky left lung base atelectasis. Electronically Signed   By: Keith Rake M.D.   On: 04/05/2020 23:55   US Abdomen Limited RUQ  Result Date: 04/06/2020 CLINICAL DATA:  Right upper quadrant pain and nausea. EXAM: ULTRASOUND ABDOMEN LIMITED RIGHT UPPER QUADRANT COMPARISON:  CT 01/28/2020 FINDINGS: Gallbladder: No gallstones or wall thickening visualized. No sonographic Murphy sign noted by sonographer. Common bile duct: Diameter: 8 mm unchanged. Liver: Mild increased parenchymal echogenicity centrally suggesting steatosis. No focal mass. Portal vein is patent on color Doppler imaging with normal direction of blood flow towards the liver. Other: Incidental right renal cyst unchanged. IMPRESSION: No acute findings. Electronically Signed   By: Marin Olp M.D.   On: 04/06/2020 16:24     Microbiology: Recent Results (from the past 240 hour(s))  Respiratory Panel by RT PCR (Flu A&B, Covid) - Nasopharyngeal Swab     Status: None   Collection Time: 04/05/20 11:59 PM   Specimen: Nasopharyngeal Swab  Result Value Ref Range Status   SARS Coronavirus 2 by RT PCR NEGATIVE NEGATIVE Final    Comment: (NOTE) SARS-CoV-2 target nucleic acids are NOT DETECTED. The SARS-CoV-2 RNA is generally detectable in upper respiratoy specimens during the acute phase of infection. The lowest concentration of SARS-CoV-2 viral copies this assay can detect is 131 copies/mL. A negative result does not preclude SARS-Cov-2 infection and should not be used as the sole basis for treatment or other patient management decisions. A negative result may occur with  improper specimen collection/handling, submission of specimen other than nasopharyngeal swab, presence of viral mutation(s) within the areas targeted by this assay, and inadequate number of viral copies (<131 copies/mL). A negative result must be combined with clinical observations, patient history, and epidemiological information. The expected result is Negative. Fact Sheet for Patients:  PinkCheek.be Fact Sheet for Healthcare Providers:  GravelBags.it This test is not yet ap proved or cleared by the [place] FDA and  has been authorized for detection and/or diagnosis of SARS-CoV-2 by FDA under an Emergency Use Authorization (EUA). This EUA will remain  in effect (meaning this test can be used) for the duration of the COVID-19 declaration under Section 564(b)(1) of the Act, 21 U.S.C. section 360bbb-3(b)(1), unless the authorization is terminated or revoked sooner.    Influenza A by PCR NEGATIVE NEGATIVE Final   Influenza B by PCR NEGATIVE NEGATIVE Final    Comment: (NOTE) The Xpert Xpress SARS-CoV-2/FLU/RSV assay is intended as an aid in  the diagnosis of influenza from  Nasopharyngeal swab specimens and  should not be used as a sole basis for treatment. Nasal washings and  aspirates are unacceptable for Xpert Xpress SARS-CoV-2/FLU/RSV  testing. Fact Sheet for Patients: PinkCheek.be Fact Sheet for Healthcare Providers: GravelBags.it This test is not yet approved or cleared by the [place] FDA and  has been authorized for detection and/or diagnosis of SARS-CoV-2 by  FDA under an Emergency Use Authorization (EUA). This EUA will remain  in effect (meaning this test can be used) for the duration of the  Covid-19 declaration under Section 564(b)(1) of the Act, 21  U.S.C. section 360bbb-3(b)(1), unless the authorization is  terminated or revoked. Performed at [facility], [address] [place], [place] [address]   Culture, blood (routine x 2)     Status: None (Preliminary result)   Collection Time: 04/06/20  1:20 AM   Specimen: BLOOD  Result Value Ref Range Status   Specimen Description BLOOD BLOOD  RIGHT HAND  Final   Special Requests   Final    BOTTLES DRAWN AEROBIC AND ANAEROBIC Blood Culture adequate volume   Culture   Final    NO GROWTH 2 DAYS Performed at [facility], [address] [place], [place] [address]    Report Status PENDING  Incomplete     Labs: Basic Metabolic Panel: Recent Labs  Lab 04/05/20 2308 04/07/20 0442  NA 138 140  K 4.5 4.2  CL 100 106  CO2 31 28  GLUCOSE 101* 155*  BUN 13 13  CREATININE 0.44 0.39*  CALCIUM 10.3 10.2   Liver Function Tests: Recent Labs  Lab 04/05/20 2308  AST 26  ALT 24  ALKPHOS 101  BILITOT 0.5  PROT 7.2  ALBUMIN 2.9*   CBC: Recent Labs  Lab 04/05/20 2308  WBC 9.8  HGB 10.4*  HCT 32.8*  MCV 98.2  PLT 339   Cardiac Enzymes: Recent Labs  Lab 04/05/20 2308  CKTOTAL 30*    CBG: Recent Labs  Lab 04/06/20 2028 04/07/20 0737 04/07/20 1138 04/07/20 1645 04/08/20 0829  GLUCAP 166* 128*  141* 161* 109*    Principal Problem:   Fever Active Problems:   Febrile illness   History of DVT of lower extremity   Muscular dystrophy ([place])   Subclinical hyperthyroidism   Generalized weakness   Lung cancer metastatic to brain ([facility])   RUQ pain   Acute metabolic encephalopathy   Anemia in neoplastic disease   Neoplastic malignant related fatigue   Brain metastases ([place])   Time coordinating discharge: 35 minutes  Signed:  Murray Hodgkins, MD  [facility]  04/08/2020, 10:32 AM

## 2020-04-08 NOTE — TOC Progression Note (Signed)
Transition of Care [facility]) - Progression Note    Patient Details  Name: Deborah Nguyen MRN: [id number] Date of Birth: September 12, 1961  [facility] CM/SW Contact  Shelbie Ammons, RN Phone Number: 04/08/2020, 11:36 AM  Clinical Narrative:   RNCM met patient and sister at bedside to follow up on any further needs. Patient states no further needs but she hopes she is going to [facility]. Discussed that patient had refused SNF day prior but she reports that this was not her plan and she is very hopeful she can go to [facility]. Discussed that this CM would contact [facility] to discuss possible bed placement and would start insurance auth but that could take 24 hours.     Expected Discharge Plan: Home/Self Care Barriers to Discharge: Continued Medical Work up  Expected Discharge Plan and Services Expected Discharge Plan: Home/Self Care       Living arrangements for the past 2 months: Single Family Home Expected Discharge Date: 04/08/20                                     Social Determinants of Health (SDOH) Interventions    Readmission Risk Interventions Readmission Risk Prevention Plan 04/07/2020  Transportation Screening Complete  PCP or Specialist Appt within 3-5 Days Complete  HRI or Home Care Consult Complete  Medication Review (RN Care Manager) Complete  Some recent data might be hidden

## 2020-04-09 ENCOUNTER — Other Ambulatory Visit: Payer: Self-pay | Admitting: Hospice and Palliative Medicine

## 2020-04-09 DIAGNOSIS — Z515 Encounter for palliative care: Secondary | ICD-10-CM

## 2020-04-09 LAB — RESPIRATORY PANEL BY RT PCR (FLU A&B, COVID)
Influenza A by PCR: NEGATIVE
Influenza B by PCR: NEGATIVE
SARS Coronavirus 2 by RT PCR: NEGATIVE

## 2020-04-09 MED ORDER — BISACODYL 10 MG RE SUPP
10.0000 mg | Freq: Every day | RECTAL | Status: DC
  Administered 2020-04-09: 10 mg via RECTAL
  Filled 2020-04-09: qty 1

## 2020-04-09 NOTE — Progress Notes (Signed)
Patient ID: Deborah Nguyen, female   DOB: 1961-06-14, 59 y.o.   MRN: [id number]  Patient is medically best at baseline. Per TOC she will not discharge till tomorrow. Awaiting insurance authorization and [facility] availability [facility].

## 2020-04-09 NOTE — Progress Notes (Signed)
Palliative care to follow at [facility]. Report given to Natalia Leatherwood, NP.

## 2020-04-09 NOTE — TOC Progression Note (Addendum)
Transition of Care [facility]) - Progression Note    Patient Details  Name: Deborah Nguyen MRN: [id number] Date of Birth: 09-May-1961  Transition of Care [facility]) CM/SW Contact  Angeleah Labrake, Gardiner Rhyme, LCSW Phone Number: 04/09/2020, 9:54 AM  Clinical Narrative:   Spoke with AC to order rapid COVID test in anticipation of transfer to [facility] today. Awaiting insurance auth hopeful to get today. Spoke with [facility] regarding bed offer and trying to get her [place] when she receives and next treatment due. Lisa-pt's sister on the phone also. [facility] may not have a bed today but tomorrow. She will confirm with worker later  12:30 Have insurance auth for [facility] tomorrow. Ref number [id number] for three days starting 5/5 next review 5/7 CM Melanie Poteat.   Expected Discharge Plan: Home/Self Care Barriers to Discharge: Continued Medical Work up  Expected Discharge Plan and Services Expected Discharge Plan: Home/Self Care       Living arrangements for the past 2 months: Single Family Home Expected Discharge Date: 04/09/20                                     Social Determinants of Health (SDOH) Interventions    Readmission Risk Interventions Readmission Risk Prevention Plan 04/07/2020  Transportation Screening Complete  PCP or Specialist Appt within 3-5 Days Complete  HRI or Home Care Consult Complete  Medication Review (RN Care Manager) Complete  Some recent data might be hidden

## 2020-04-09 NOTE — Discharge Summary (Signed)
Physician Discharge Summary  Deborah Nguyen VFI:[id number] DOB: 1961/08/07 DOA: 04/05/2020  PCP: Tracie Harrier, MD  Admit date: 04/05/2020 Discharge date: 04/09/2020  Recommendations for Outpatient Follow-up:  1. Ongoing care for cancer.  Started on Decadron per oncology with plans for outpatient taper per Dr. Janese Banks.   Follow-up Information    Tracie Harrier, MD. Go on 04/09/2020.   Specialty: Internal Medicine Why: Please schedule PCP follow-up within 5-7 days of discharge date. Contact information: [place] [address] [phone]        Sindy Guadeloupe, MD. Go on 04/18/2020.   Specialty: Oncology Why: at 9:30 am Contact information: [place] [address] [phone]            Discharge Diagnoses: Principal diagnosis is #1 1. Fever without a source.   2. 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 Generalized weakness with inability to ambulate 4. Acute metabolic encephalopathy 5. 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 PMH DVT left leg 7. Chronic back pain not felt to be related to malignancy 8. Muscular dystrophy  Discharge Condition: improved Disposition: SNF  Diet recommendation: Regular  Filed Weights   04/05/20 2211  Weight: 60.3 kg    History of present illness:  59 year old woman PMH stage IV squamous cell carcinoma of the lung with metastatic disease to adrenal glands and brain currently on chemotherapy, presented with progressive weakness, inability to ambulate, worsening confusion making care at home difficult. Fever reported at home. Admitted for fever without a source in the context of malignancy and ongoing chemotherapy.  Hospital Course:  Patient was treated with empiric antibiotics, clinical course was unremarkable with no recurrent fever and no identification of any infection.  Abdominal pain resolved  spontaneously.  Patient seen by oncology in consultation with recommendation to start Decadron.  This will be continued on discharge and closely managed by oncology as an outpatient.  Seen by physical therapy and recommendation for SNF.  Patient agreeable. \  Fever without a source.  Right upper quadrant pain.  Urinalysis unremarkable. Blood culture pending. Chest x-ray no acute disease. --Etiology and significance unclear.    No localizing symptoms.  Abdominal pain resolved.  Blood cultures no growth thus far.  Reportedly has had fevers in the past without identification of source.  Favor noninfectious etiology.  No further evaluation suggested.  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 No XRT planned per last oncology note. --Dr. Janese Banks recommended Decadron, will change to p.o. and continue current dosing.  Oncology will taper as an outpatient.  Generalized weakness with inability to ambulate --PT recommended SNF.  Acute metabolic encephalopathy.  Chart reports patient's been having confusion at home.  CT head negative. --Appears mild.  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  PMH DVT left leg --Continue apixaban  Chronic back pain not felt to be related to malignancy --Continue fentanyl patch  Muscular dystrophy  Cigarette smoker  Significant Hospital Events    5/1 admitted for fever without a source  Consults:  Oncology Dr Janese Banks Procedures:   None  Significant Diagnostic Tests:   Chest x-ray no acute disease.  Urinalysis negative.  Micro Data:   Blood culture NGTD 2 days  Antimicrobials:   Cefepime 5/2 > 5/4  Metronidazole 5/2 > 5/3  Vancomycin 5/2 > 5/4  Today's assessment: S: Feels better except for some  nausea.  No abdominal pain.  Tolerating diet. Slept ok O: Vitals:  Vitals:   04/08/20 2336 04/09/20 0749  BP: 122/68 131/68  Pulse: (!) 45 (!)  49  Resp: 16 18  Temp: 98 F (36.7 C) 97.8 F (36.6 C)  SpO2: 98% 98%    Constitutional:  . Appears calm and comfortable Respiratory:  . CTA bilaterally, no w/r/r.  . Respiratory effort normal.  Cardiovascular:  . RRR, no m/r/g . No LE extremity edema   Abdomen:  . Soft, nontender, nondistended Psychiatric:  . 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 She has an increase risk of malignant hyperthermia with anesthesia. She should not receive Propofol.    Serzone [nefazodone] Other (See Comments)   Unknown   Biaxin [clarithromycin] Nausea Only   Erythromycin Nausea Only   Penicillins Rash      Medication List    TAKE these medications   acetaminophen 325 MG tablet Commonly known as: TYLENOL Take 650 mg by mouth every 6 (six) hours as needed.   citalopram 40 MG tablet Commonly known as: CELEXA Take 1 tablet (40 mg total) by mouth daily.   Colace 100 MG capsule Generic drug: docusate sodium Take 200 mg by mouth 2 (two) times daily.   dexamethasone 4 MG tablet Commonly known as: DECADRON Take 1 tablet (4 mg total) by mouth every 12 (twelve) hours.   Eliquis 5 MG Tabs tablet Generic drug: apixaban TAKE 1 TABLET BY MOUTH TWICE A DAY   fentaNYL 12 MCG/HR Commonly known as: [place] 1 patch onto the skin every 3 (three) days. What changed: See the new instructions.   furosemide 20 MG tablet Commonly known as: LASIX Take 20 mg by mouth as needed.   methimazole 5 MG tablet Commonly known as: TAPAZOLE Take 5 mg by mouth daily.     MiraLax 17 g packet Generic drug: polyethylene glycol Take 17 g by mouth 2 (two) times daily.   OLANZapine 10 MG tablet Commonly known as: ZYPREXA TAKE ONE TABLET BY MOUTH AT BEDTIME What changed:   how much to take  how to take this  when to take this   ondansetron 8 MG disintegrating tablet Commonly known as: ZOFRAN-ODT Take 8 mg by mouth every 6 (six) hours as needed.   Oxycodone HCl 10 MG Tabs Take 1 tablet (10 mg total) by mouth every 4 (four) hours as needed. What changed: how much to take   pantoprazole 20 MG tablet Commonly known as: PROTONIX TAKE 1 TABLET BY MOUTH 2 TIMES DAILY      Allergies  Allergen Reactions  . Morphine And Related Anaphylaxis  . Paxil [Paroxetine Hcl] Other (See Comments)    Hallucinations   . Ciprofloxacin Other (See Comments)    Unknown  . Levaquin [Levofloxacin] Other (See Comments)    Unknown   . 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 She has an increase risk of malignant hyperthermia with anesthesia. She should not receive Propofol.   Marland Kitchen  Serzone [Nefazodone] Other (See Comments)    Unknown   . Biaxin [Clarithromycin] Nausea Only  . Erythromycin Nausea Only  . Penicillins Rash    The results of significant diagnostics from this hospitalization (including imaging, microbiology, ancillary and laboratory) are listed below for reference.    Significant Diagnostic Studies: CT Head Wo Contrast  Result Date: 04/06/2020 CLINICAL DATA:  Weakness. EXAM: CT HEAD WITHOUT CONTRAST TECHNIQUE: Contiguous axial images were obtained from the base of the skull through the vertex without intravenous contrast. COMPARISON:  None. FINDINGS: Brain: There is mild cerebral atrophy with widening of the extra-axial spaces and ventricular dilatation. There are areas of decreased attenuation within the white matter tracts of the supratentorial brain, consistent with microvascular disease  changes. Vascular: No hyperdense vessel or unexpected calcification. Skull: Normal. Negative for fracture or focal lesion. Sinuses/Orbits: No acute finding. Other: None. IMPRESSION: 1. No acute intracranial abnormality. 2. Mild cerebral atrophy and microvascular disease changes of the supratentorial brain. Electronically Signed   By: Virgina Norfolk M.D.   On: 04/06/2020 01:12   MR Brain W Wo Contrast  Result Date: 03/25/2020 CLINICAL DATA:  Lung cancer with brain metastasis. Follow-up. EXAM: MRI HEAD WITHOUT AND WITH CONTRAST TECHNIQUE: Multiplanar, multiecho pulse sequences of the brain and surrounding structures were obtained without and with intravenous contrast. CONTRAST:  1mL GADAVIST GADOBUTROL 1 MMOL/ML IV SOLN COMPARISON:  MRI of the brain September 26, 2019. FINDINGS: Brain: No acute infarction, hemorrhage, hydrocephalus or extra-axial collection. Previously described small lesions in the temporal lobes, 2 on the left and 1 on the right, are no longer seen. Previously described lesion in the left posterior basal ganglia is no longer seen. Interval decrease in size of left occipital enhancing lesion, now measuring 2 mm (4 mm on prior). Mild decrease in size of lesion in the right parietal cortex measuring 2 mm (3 mm on prior). Interval development of a 2 mm enhancing lesion in the right superior frontal gyrus and a 6 mm rim enhancing lesion in the right middle frontal gyrus with surrounding vasogenic edema. Interval development of a 3 mm lesion in the deep white matter of the left frontal lobe. Interval development of 4.8 mm enhancing lesion in the right cerebellar hemisphere. Vascular: Normal flow voids. Skull and upper cervical spine: Normal marrow signal. Sinuses/Orbits: Negative. Other: Bilateral mastoid effusions. IMPRESSION: Mixed interval treatment response with regression of bilateral temporal lobe and left posterior basal ganglia enhancing lesions, interval decrease in size of left occipital  lobe and right parietal cortex lesions. Interval development of Joe right frontal lobe lesion, 1 left frontal lobe lesion and 1 right cerebellar hemisphere lesion. These results will be called to the ordering clinician or representative by the Radiologist Assistant, and communication documented in the PACS or [employer]. Electronically Signed   By: Pedro Earls M.D.   On: 03/25/2020 16:21   DG Chest Portable 1 View  Result Date: 04/05/2020 CLINICAL DATA:  Weakness. History of lung cancer. Progressive weakness. EXAM: PORTABLE CHEST 1 VIEW COMPARISON:  Radiograph 12/27/2019. CT 01/28/2020 FINDINGS: Tip of the right chest port in the SVC. Mild elevation of right hemidiaphragm with streaky opacity at the right lung base, corresponding to nodule on CT. Minor streaky left lung base atelectasis. The heart is normal in size. Normal mediastinal contours. No pleural fluid or pneumothorax. No acute osseous abnormalities are seen. IMPRESSION: 1. Mild elevation of the right hemidiaphragm with streaky opacity at the right lung base, corresponding to nodule on CT. 2.  Minor streaky left lung base atelectasis. Electronically Signed   By: Keith Rake M.D.   On: 04/05/2020 23:55   US Abdomen Limited RUQ  Result Date: 04/06/2020 CLINICAL DATA:  Right upper quadrant pain and nausea. EXAM: ULTRASOUND ABDOMEN LIMITED RIGHT UPPER QUADRANT COMPARISON:  CT 01/28/2020 FINDINGS: Gallbladder: No gallstones or wall thickening visualized. No sonographic Murphy sign noted by sonographer. Common bile duct: Diameter: 8 mm unchanged. Liver: Mild increased parenchymal echogenicity centrally suggesting steatosis. No focal mass. Portal vein is patent on color Doppler imaging with normal direction of blood flow towards the liver. Other: Incidental right renal cyst unchanged. IMPRESSION: No acute findings. Electronically Signed   By: Marin Olp M.D.   On: 04/06/2020 16:24    Microbiology: Recent Results (from the  past 240 hour(s))  Respiratory Panel by RT PCR (Flu A&B, Covid) - Nasopharyngeal Swab     Status: None   Collection Time: 04/05/20 11:59 PM   Specimen: Nasopharyngeal Swab  Result Value Ref Range Status   SARS Coronavirus 2 by RT PCR NEGATIVE NEGATIVE Final    Comment: (NOTE) SARS-CoV-2 target nucleic acids are NOT DETECTED. The SARS-CoV-2 RNA is generally detectable in upper respiratoy specimens during the acute phase of infection. The lowest concentration of SARS-CoV-2 viral copies this assay can detect is 131 copies/mL. A negative result does not preclude SARS-Cov-2 infection and should not be used as the sole basis for treatment or other patient management decisions. A negative result may occur with  improper specimen collection/handling, submission of specimen other than nasopharyngeal swab, presence of viral mutation(s) within the areas targeted by this assay, and inadequate number of viral copies (<131 copies/mL). A negative result must be combined with clinical observations, patient history, and epidemiological information. The expected result is Negative. Fact Sheet for Patients:  PinkCheek.be Fact Sheet for Healthcare Providers:  GravelBags.it This test is not yet ap proved or cleared by the [place] FDA and  has been authorized for detection and/or diagnosis of SARS-CoV-2 by FDA under an Emergency Use Authorization (EUA). This EUA will remain  in effect (meaning this test can be used) for the duration of the COVID-19 declaration under Section 564(b)(1) of the Act, 21 U.S.C. section 360bbb-3(b)(1), unless the authorization is terminated or revoked sooner.    Influenza A by PCR NEGATIVE NEGATIVE Final   Influenza B by PCR NEGATIVE NEGATIVE Final    Comment: (NOTE) The Xpert Xpress SARS-CoV-2/FLU/RSV assay is intended as an aid in  the diagnosis of influenza from Nasopharyngeal swab specimens and  should not be  used as a sole basis for treatment. Nasal washings and  aspirates are unacceptable for Xpert Xpress SARS-CoV-2/FLU/RSV  testing. Fact Sheet for Patients: PinkCheek.be Fact Sheet for Healthcare Providers: GravelBags.it This test is not yet approved or cleared by the [place] FDA and  has been authorized for detection and/or diagnosis of SARS-CoV-2 by  FDA under an Emergency Use Authorization (EUA). This EUA will remain  in effect (meaning this test can be used) for the duration of the  Covid-19 declaration under Section 564(b)(1) of the Act, 21  U.S.C. section 360bbb-3(b)(1), unless the authorization is  terminated or revoked. Performed at [facility], [address] [place],  [address]   Culture, blood (routine x 2)     Status: None (Preliminary result)   Collection Time: 04/06/20  1:20 AM   Specimen: BLOOD  Result Value Ref Range Status   Specimen Description BLOOD BLOOD RIGHT HAND  Final   Special  Requests   Final    BOTTLES DRAWN AEROBIC AND ANAEROBIC Blood Culture adequate volume   Culture   Final    NO GROWTH 3 DAYS Performed at [facility], [address] [place], [place] [address]    Report Status PENDING  Incomplete     Labs: Basic Metabolic Panel: Recent Labs  Lab 04/05/20 2308 04/07/20 0442  NA 138 140  K 4.5 4.2  CL 100 106  CO2 31 28  GLUCOSE 101* 155*  BUN 13 13  CREATININE 0.44 0.39*  CALCIUM 10.3 10.2   Liver Function Tests: Recent Labs  Lab 04/05/20 2308  AST 26  ALT 24  ALKPHOS 101  BILITOT 0.5  PROT 7.2  ALBUMIN 2.9*   CBC: Recent Labs  Lab 04/05/20 2308  WBC 9.8  HGB 10.4*  HCT 32.8*  MCV 98.2  PLT 339   Cardiac Enzymes: Recent Labs  Lab 04/05/20 2308  CKTOTAL 30*    CBG: Recent Labs  Lab 04/07/20 1138 04/07/20 1645 04/08/20 0829 04/08/20 1200 04/08/20 1649  GLUCAP 141* 161* 109* 158* 152*    Principal Problem:    Fever Active Problems:   Febrile illness   History of DVT of lower extremity   Muscular dystrophy ([place])   Subclinical hyperthyroidism   Generalized weakness   Lung cancer metastatic to brain (HCC)   RUQ pain   Acute metabolic encephalopathy   Anemia in neoplastic disease   Neoplastic malignant related fatigue   Brain metastases ([place])   Time coordinating discharge: 35 minutes  Signed: Fritzi Mandes, MD 04/09/2020, 9:45 AM

## 2020-04-09 NOTE — Care Management Important Message (Signed)
Important Message  Patient Details  Name: Deborah Nguyen MRN: [id number] Date of Birth: Jan 24, 1961   Medicare Important Message Given:  Yes     Juliann Pulse A Zebastian Carico 04/09/2020, 9:49 AM

## 2020-04-10 ENCOUNTER — Other Ambulatory Visit: Payer: Medicare Other | Admitting: Adult Health Nurse Practitioner

## 2020-04-10 MED ORDER — OXYCODONE HCL 10 MG PO TABS: 10.0000 mg | ORAL_TABLET | ORAL | 0 refills | Status: AC | PRN

## 2020-04-10 MED ORDER — FENTANYL 12 MCG/HR TD PT72: 1.0000 | MEDICATED_PATCH | TRANSDERMAL | 0 refills | Status: DC

## 2020-04-10 NOTE — TOC Transition Note (Signed)
Transition of Care [facility]) - CM/SW Discharge Note   Patient Details  Name: Deborah Nguyen MRN: [id number] Date of Birth: Feb 08, 1961  [facility] CM/SW Contact:  Elease Hashimoto, LCSW Phone Number: 04/10/2020, 8:11 AM   Clinical Narrative:   Pt medically cleared to transfer to [facility] today. Insurance auth received yesterday ref [id number] three days auth next review 5/7. Melanie Poteat CM following. Sister and pt aware of plan and are in agreement. EMS to transfer once bedside RN ready. DC packet in chart. Lisa-sister and aware of plan and wants to be called when EMS here.    Final next level of care: Skilled Nursing Facility Barriers to Discharge: Barriers Resolved   Patient Goals and CMS Choice   CMS Medicare.gov Compare Post Acute Care list provided to:: Patient Choice offered to / list presented to : Patient  Discharge Placement PASRR number recieved: 04/08/20            Patient chooses bed at: [facility] [place] [facility] Patient to be transferred to facility by: EMS Name of family member notified: Lisa-sister Patient and family notified of of transfer: 04/10/20  Discharge Plan and Services                                     Social Determinants of Health (SDOH) Interventions     Readmission Risk Interventions Readmission Risk Prevention Plan 04/07/2020  Transportation Screening Complete  PCP or Specialist Appt within 3-5 Days Complete  HRI or Home Care Consult Complete  Medication Review (RN Care Manager) Complete  Some recent data might be hidden

## 2020-04-10 NOTE — Progress Notes (Signed)
Report called to [facility] Minor LPN at [employer]

## 2020-04-10 NOTE — Discharge Summary (Signed)
Physician Discharge Summary  Deborah Nguyen ATF:[id number] DOB: 12-May-1961 DOA: 04/05/2020  PCP: Tracie Harrier, MD  Admit date: 04/05/2020 Discharge date: 04/10/2020  Recommendations for Outpatient Follow-up:  1. Ongoing care for cancer.  Started on Decadron per oncology with plans for outpatient taper per Dr. Janese Banks.   Follow-up Information    Tracie Harrier, MD. Go on 04/09/2020.   Specialty: Internal Medicine Why: Please schedule PCP follow-up within 5-7 days of discharge date. Contact information: [place] [address] [phone]        Sindy Guadeloupe, MD. Go on 04/18/2020.   Specialty: Oncology Why: at 9:30 am Contact information: [place] [address] [phone]            Discharge Diagnoses: Principal diagnosis is #1 1. Fever without a source.   2. 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 Generalized weakness with inability to ambulate 4. Acute metabolic encephalopathy 5. 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 PMH DVT left leg 7. Chronic back pain not felt to be related to malignancy 8. Muscular dystrophy  Discharge Condition: improved Disposition: SNF  Diet recommendation: Regular  Filed Weights   04/05/20 2211  Weight: 60.3 kg    History of present illness:  59 year old woman PMH stage IV squamous cell carcinoma of the lung with metastatic disease to adrenal glands and brain currently on chemotherapy, presented with progressive weakness, inability to ambulate, worsening confusion making care at home difficult. Fever reported at home. Admitted for fever without a source in the context of malignancy and ongoing chemotherapy.  Hospital Course:  Patient was treated with empiric antibiotics, clinical course was unremarkable with no recurrent fever and no identification of any infection.  Abdominal pain resolved  spontaneously.  Patient seen by oncology in consultation with recommendation to start Decadron.  This will be continued on discharge and closely managed by oncology as an outpatient.  Seen by physical therapy and recommendation for SNF.  Patient agreeable. \  Fever without a source.  Right upper quadrant pain.  Urinalysis unremarkable. Blood culture pending. Chest x-ray no acute disease. --Etiology and significance unclear.    No localizing symptoms.  Abdominal pain resolved.  Blood cultures no growth thus far.  Reportedly has had fevers in the past without identification of source.  Favor noninfectious etiology.  No further evaluation suggested.  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 No XRT planned per last oncology note. --Dr. Janese Banks recommended Decadron, will change to p.o. and continue current dosing.  Oncology will taper as an outpatient. --Palliative care to follow at [facility]  Generalized weakness with inability to ambulate --PT recommended SNF.  Acute metabolic encephalopathy.  Chart reports patient's been having confusion at home.  CT head negative. --Appears mild.  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  PMH DVT left leg --Continue apixaban  Chronic back pain not felt to be related to malignancy --Continue fentanyl patch  H/o Muscular dystrophy  Cigarette smoker -counselled cessation  Significant Hospital Events    5/1 admitted for fever without a source  Consults:  Oncology Dr Janese Banks Procedures:   None  Significant Diagnostic Tests:   Chest x-ray no acute disease.  Urinalysis negative.  Micro Data:   Blood culture NGTD 2 days  Antimicrobials:   Cefepime 5/2 > 5/4  Metronidazole 5/2 > 5/3  Vancomycin 5/2 > 5/4  Today's assessment: S: Feels better except for some nausea.  No abdominal pain.  Tolerating diet. Slept ok O: Vitals:  Vitals:    04/09/20 2341 04/10/20 0750  BP: 111/68 122/72  Pulse: (!) 56 (!) 54  Resp:  16  Temp: 97.8 F (36.6 C) 98.5 F (36.9 C)  SpO2: 99% 99%    Constitutional:  . Appears calm and comfortable Respiratory:  . CTA bilaterally, no w/r/r.  . Respiratory effort normal.  Cardiovascular:  . RRR, no m/r/g . No LE extremity edema   Abdomen:  . Soft, nontender, nondistended Psychiatric:  . 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 She has an increase risk of malignant hyperthermia with anesthesia. She should not receive Propofol.    Serzone [nefazodone] Other (See Comments)   Unknown   Biaxin [clarithromycin] Nausea Only   Erythromycin Nausea Only   Penicillins Rash      Medication List    TAKE these medications   acetaminophen 325 MG tablet Commonly known as: TYLENOL Take 650 mg by mouth every 6 (six) hours as needed.   citalopram 40 MG tablet Commonly known as: CELEXA Take 1 tablet (40 mg total) by mouth daily.   Colace 100 MG capsule Generic drug: docusate sodium Take 200 mg by mouth 2 (two) times daily.   dexamethasone 4 MG tablet Commonly known as: DECADRON Take 1 tablet (4 mg total) by mouth every 12 (twelve) hours.   Eliquis 5 MG Tabs tablet Generic drug: apixaban TAKE 1 TABLET BY MOUTH TWICE A DAY   fentaNYL 12 MCG/HR Commonly known as: [place] 1 patch onto the skin every 3 (three) days. What changed: See the new instructions.   furosemide 20 MG tablet Commonly known as: LASIX Take 20 mg by mouth as needed.   methimazole 5 MG  tablet Commonly known as: TAPAZOLE Take 5 mg by mouth daily.   MiraLax 17 g packet Generic drug: polyethylene glycol Take 17 g by mouth 2 (two) times daily.   OLANZapine 10 MG tablet Commonly known as: ZYPREXA TAKE ONE TABLET BY MOUTH AT BEDTIME What changed:   how much to take  how to take this  when to take this   ondansetron 8 MG disintegrating tablet Commonly known as: ZOFRAN-ODT Take 8 mg by mouth every 6 (six) hours as needed.   Oxycodone HCl 10 MG Tabs Take 1 tablet (10 mg total) by mouth every 4 (four) hours as needed. What changed: how much to take   pantoprazole 20 MG tablet Commonly known as: PROTONIX TAKE 1 TABLET BY MOUTH 2 TIMES DAILY      Allergies  Allergen Reactions  . Morphine And Related Anaphylaxis  . Paxil [Paroxetine Hcl] Other (See Comments)    Hallucinations   . Ciprofloxacin Other (See Comments)    Unknown  . Levaquin [Levofloxacin] Other (See Comments)    Unknown   . 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 She has an increase risk of malignant hyperthermia  with anesthesia. She should not receive Propofol.   Donne Hazel [Nefazodone] Other (See Comments)    Unknown   . Biaxin [Clarithromycin] Nausea Only  . Erythromycin Nausea Only  . Penicillins Rash    The results of significant diagnostics from this hospitalization (including imaging, microbiology, ancillary and laboratory) are listed below for reference.    Significant Diagnostic Studies: CT Head Wo Contrast  Result Date: 04/06/2020 CLINICAL DATA:  Weakness. EXAM: CT HEAD WITHOUT CONTRAST TECHNIQUE: Contiguous axial images were obtained from the base of the skull through the vertex without intravenous contrast. COMPARISON:  None. FINDINGS: Brain: There is mild cerebral atrophy with widening of the extra-axial spaces and ventricular dilatation. There are areas of decreased attenuation within the white matter tracts of the  supratentorial brain, consistent with microvascular disease changes. Vascular: No hyperdense vessel or unexpected calcification. Skull: Normal. Negative for fracture or focal lesion. Sinuses/Orbits: No acute finding. Other: None. IMPRESSION: 1. No acute intracranial abnormality. 2. Mild cerebral atrophy and microvascular disease changes of the supratentorial brain. Electronically Signed   By: Virgina Norfolk M.D.   On: 04/06/2020 01:12   MR Brain W Wo Contrast  Result Date: 03/25/2020 CLINICAL DATA:  Lung cancer with brain metastasis. Follow-up. EXAM: MRI HEAD WITHOUT AND WITH CONTRAST TECHNIQUE: Multiplanar, multiecho pulse sequences of the brain and surrounding structures were obtained without and with intravenous contrast. CONTRAST:  47mL GADAVIST GADOBUTROL 1 MMOL/ML IV SOLN COMPARISON:  MRI of the brain September 26, 2019. FINDINGS: Brain: No acute infarction, hemorrhage, hydrocephalus or extra-axial collection. Previously described small lesions in the temporal lobes, 2 on the left and 1 on the right, are no longer seen. Previously described lesion in the left posterior basal ganglia is no longer seen. Interval decrease in size of left occipital enhancing lesion, now measuring 2 mm (4 mm on prior). Mild decrease in size of lesion in the right parietal cortex measuring 2 mm (3 mm on prior). Interval development of a 2 mm enhancing lesion in the right superior frontal gyrus and a 6 mm rim enhancing lesion in the right middle frontal gyrus with surrounding vasogenic edema. Interval development of a 3 mm lesion in the deep white matter of the left frontal lobe. Interval development of 4.8 mm enhancing lesion in the right cerebellar hemisphere. Vascular: Normal flow voids. Skull and upper cervical spine: Normal marrow signal. Sinuses/Orbits: Negative. Other: Bilateral mastoid effusions. IMPRESSION: Mixed interval treatment response with regression of bilateral temporal lobe and left posterior basal ganglia  enhancing lesions, interval decrease in size of left occipital lobe and right parietal cortex lesions. Interval development of Joe right frontal lobe lesion, 1 left frontal lobe lesion and 1 right cerebellar hemisphere lesion. These results will be called to the ordering clinician or representative by the Radiologist Assistant, and communication documented in the PACS or [employer]. Electronically Signed   By: Pedro Earls M.D.   On: 03/25/2020 16:21   DG Chest Portable 1 View  Result Date: 04/05/2020 CLINICAL DATA:  Weakness. History of lung cancer. Progressive weakness. EXAM: PORTABLE CHEST 1 VIEW COMPARISON:  Radiograph 12/27/2019. CT 01/28/2020 FINDINGS: Tip of the right chest port in the SVC. Mild elevation of right hemidiaphragm with streaky opacity at the right lung base, corresponding to nodule on CT. Minor streaky left lung base atelectasis. The heart is normal in size. Normal mediastinal contours. No pleural fluid or pneumothorax. No acute osseous abnormalities are seen. IMPRESSION: 1. Mild elevation of the right hemidiaphragm with streaky opacity at  the right lung base, corresponding to nodule on CT. 2. Minor streaky left lung base atelectasis. Electronically Signed   By: Keith Rake M.D.   On: 04/05/2020 23:55   US Abdomen Limited RUQ  Result Date: 04/06/2020 CLINICAL DATA:  Right upper quadrant pain and nausea. EXAM: ULTRASOUND ABDOMEN LIMITED RIGHT UPPER QUADRANT COMPARISON:  CT 01/28/2020 FINDINGS: Gallbladder: No gallstones or wall thickening visualized. No sonographic Murphy sign noted by sonographer. Common bile duct: Diameter: 8 mm unchanged. Liver: Mild increased parenchymal echogenicity centrally suggesting steatosis. No focal mass. Portal vein is patent on color Doppler imaging with normal direction of blood flow towards the liver. Other: Incidental right renal cyst unchanged. IMPRESSION: No acute findings. Electronically Signed   By: Marin Olp M.D.   On:  04/06/2020 16:24    Microbiology: Recent Results (from the past 240 hour(s))  Respiratory Panel by RT PCR (Flu A&B, Covid) - Nasopharyngeal Swab     Status: None   Collection Time: 04/05/20 11:59 PM   Specimen: Nasopharyngeal Swab  Result Value Ref Range Status   SARS Coronavirus 2 by RT PCR NEGATIVE NEGATIVE Final    Comment: (NOTE) SARS-CoV-2 target nucleic acids are NOT DETECTED. The SARS-CoV-2 RNA is generally detectable in upper respiratoy specimens during the acute phase of infection. The lowest concentration of SARS-CoV-2 viral copies this assay can detect is 131 copies/mL. A negative result does not preclude SARS-Cov-2 infection and should not be used as the sole basis for treatment or other patient management decisions. A negative result may occur with  improper specimen collection/handling, submission of specimen other than nasopharyngeal swab, presence of viral mutation(s) within the areas targeted by this assay, and inadequate number of viral copies (<131 copies/mL). A negative result must be combined with clinical observations, patient history, and epidemiological information. The expected result is Negative. Fact Sheet for Patients:  PinkCheek.be Fact Sheet for Healthcare Providers:  GravelBags.it This test is not yet ap proved or cleared by the [place] FDA and  has been authorized for detection and/or diagnosis of SARS-CoV-2 by FDA under an Emergency Use Authorization (EUA). This EUA will remain  in effect (meaning this test can be used) for the duration of the COVID-19 declaration under Section 564(b)(1) of the Act, 21 U.S.C. section 360bbb-3(b)(1), unless the authorization is terminated or revoked sooner.    Influenza A by PCR NEGATIVE NEGATIVE Final   Influenza B by PCR NEGATIVE NEGATIVE Final    Comment: (NOTE) The Xpert Xpress SARS-CoV-2/FLU/RSV assay is intended as an aid in  the diagnosis of  influenza from Nasopharyngeal swab specimens and  should not be used as a sole basis for treatment. Nasal washings and  aspirates are unacceptable for Xpert Xpress SARS-CoV-2/FLU/RSV  testing. Fact Sheet for Patients: PinkCheek.be Fact Sheet for Healthcare Providers: GravelBags.it This test is not yet approved or cleared by the [place] FDA and  has been authorized for detection and/or diagnosis of SARS-CoV-2 by  FDA under an Emergency Use Authorization (EUA). This EUA will remain  in effect (meaning this test can be used) for the duration of the  Covid-19 declaration under Section 564(b)(1) of the Act, 21  U.S.C. section 360bbb-3(b)(1), unless the authorization is  terminated or revoked. Performed at [facility], [address] [place], [place] [address]   Culture, blood (routine x 2)     Status: None (Preliminary result)   Collection Time: 04/06/20  1:20 AM   Specimen: BLOOD  Result Value Ref Range Status   Specimen  Description BLOOD BLOOD RIGHT HAND  Final   Special Requests   Final    BOTTLES DRAWN AEROBIC AND ANAEROBIC Blood Culture adequate volume   Culture   Final    NO GROWTH 3 DAYS Performed at [facility], [address] [place], [place] [address]    Report Status PENDING  Incomplete  Respiratory Panel by RT PCR (Flu A&B, Covid) - Nasopharyngeal Swab     Status: None   Collection Time: 04/09/20 11:33 AM   Specimen: Nasopharyngeal Swab  Result Value Ref Range Status   SARS Coronavirus 2 by RT PCR NEGATIVE NEGATIVE Final    Comment: (NOTE) SARS-CoV-2 target nucleic acids are NOT DETECTED. The SARS-CoV-2 RNA is generally detectable in upper respiratoy specimens during the acute phase of infection. The lowest concentration of SARS-CoV-2 viral copies this assay can detect is 131 copies/mL. A negative result does not preclude SARS-Cov-2 infection and should not be used as the sole basis  for treatment or other patient management decisions. A negative result may occur with  improper specimen collection/handling, submission of specimen other than nasopharyngeal swab, presence of viral mutation(s) within the areas targeted by this assay, and inadequate number of viral copies (<131 copies/mL). A negative result must be combined with clinical observations, patient history, and epidemiological information. The expected result is Negative. Fact Sheet for Patients:  PinkCheek.be Fact Sheet for Healthcare Providers:  GravelBags.it This test is not yet ap proved or cleared by the [place] FDA and  has been authorized for detection and/or diagnosis of SARS-CoV-2 by FDA under an Emergency Use Authorization (EUA). This EUA will remain  in effect (meaning this test can be used) for the duration of the COVID-19 declaration under Section 564(b)(1) of the Act, 21 U.S.C. section 360bbb-3(b)(1), unless the authorization is terminated or revoked sooner.    Influenza A by PCR NEGATIVE NEGATIVE Final   Influenza B by PCR NEGATIVE NEGATIVE Final    Comment: (NOTE) The Xpert Xpress SARS-CoV-2/FLU/RSV assay is intended as an aid in  the diagnosis of influenza from Nasopharyngeal swab specimens and  should not be used as a sole basis for treatment. Nasal washings and  aspirates are unacceptable for Xpert Xpress SARS-CoV-2/FLU/RSV  testing. Fact Sheet for Patients: PinkCheek.be Fact Sheet for Healthcare Providers: GravelBags.it This test is not yet approved or cleared by the [place] FDA and  has been authorized for detection and/or diagnosis of SARS-CoV-2 by  FDA under an Emergency Use Authorization (EUA). This EUA will remain  in effect (meaning this test can be used) for the duration of the  Covid-19 declaration under Section 564(b)(1) of the Act, 21  U.S.C.  section 360bbb-3(b)(1), unless the authorization is  terminated or revoked. Performed at [facility], [address] [place], [place] [address]      Labs: Basic Metabolic Panel: Recent Labs  Lab 04/05/20 2308 04/07/20 0442  NA 138 140  K 4.5 4.2  CL 100 106  CO2 31 28  GLUCOSE 101* 155*  BUN 13 13  CREATININE 0.44 0.39*  CALCIUM 10.3 10.2   Liver Function Tests: Recent Labs  Lab 04/05/20 2308  AST 26  ALT 24  ALKPHOS 101  BILITOT 0.5  PROT 7.2  ALBUMIN 2.9*   CBC: Recent Labs  Lab 04/05/20 2308  WBC 9.8  HGB 10.4*  HCT 32.8*  MCV 98.2  PLT 339   Cardiac Enzymes: Recent Labs  Lab 04/05/20 2308  CKTOTAL 30*    CBG: Recent Labs  Lab 04/07/20 1138 04/07/20 1645 04/08/20 0829 04/08/20 1200 04/08/20 1649  GLUCAP 141* 161* 109* 158* 152*     Time coordinating discharge: 35 minutes  Signed: Fritzi Mandes, MD 04/10/2020, 8:00 AM

## 2020-04-11 LAB — CULTURE, BLOOD (ROUTINE X 2)
Culture: NO GROWTH
Special Requests: ADEQUATE

## 2020-04-16 ENCOUNTER — Other Ambulatory Visit: Payer: Self-pay

## 2020-04-16 ENCOUNTER — Telehealth: Payer: Self-pay | Admitting: Hospice and Palliative Medicine

## 2020-04-16 ENCOUNTER — Non-Acute Institutional Stay: Payer: Medicare Other | Admitting: Primary Care

## 2020-04-16 DIAGNOSIS — Z515 Encounter for palliative care: Secondary | ICD-10-CM

## 2020-04-16 NOTE — Telephone Encounter (Signed)
I received a call from patient's sister.  Reportedly patient is having some constipation at the nursing facility.  It sounds like the SNF has given her mag citrate and it has been ineffective so far.  Family is unclear if patient is receiving her daily bowel regimen of MiraLAX and Colace.  Message sent to Ralene Bathe, NP with palliative care to see if she can help clarify patient's medication regimen when she sees patient at the facility.

## 2020-04-16 NOTE — Progress Notes (Signed)
[facility] Consult Note Telephone: [phone]  Fax: [phone]  PATIENT NAME: Deborah Nguyen [address] [place] [address] [phone] (home)  DOB: 04-Oct-1961 MRN: [id number]  PRIMARY CARE PROVIDER:    Juluis Pitch MD  REFERRING PROVIDER:   Juluis Pitch MD  RESPONSIBLE PARTY:   Extended Emergency Contact Information Primary Emergency Contact: [place] Phone: [phone] Relation: Sister Secondary Emergency Contact: Rondel Jumbo [place] Phone: [phone] Relation: Other  I met with patient in facility.I met with Deborah Nguyen today for the first time on referral from [place]:   1. Advance Care Planning/Goals of Care: Goals include to maximize quality of life and symptom management. Son is POA [place]. Sister requested a call back, which was made. No answer, message left. We discussed her MOST form and reviewed. She states that she does not want to be kept alive by machines and that if her heart stops to let her go.She also spoke very fondly of her family including son and grandchildren and an identical twin sister who cares for her at home. She states her plan is to be able to go back home with the care of her sister.  Our advance care planning conversation included a discussion about:   . The value and importance of advance care planning  . Exploration of goals of care in the event of a sudden injury or illness  . Identification and preparation of a healthcare agent  . Review of an advance directive document   2. Symptom Management:    Constipation: States she has not had a bm in 2 weeks. Was taking miralax and stool softeners at home.  Recommend changing colace to senna S 2 tabs BID.  Please administer laxative until BM is obtained. Recommend Lactulose 30 m q 2 hrs or MOM 30-60 ml. Usually takes 60 ml at home. Staff  reports some bm but patient endorses bloat and pain in abdomen.   Intake: Reports early saiety and bloatedness from constipation.She  States that she has been drinking the supplements. I would encourage weekly weights to track her intake.   Mobility: Walks with walker, denies falls.   Pain: Has fentanyl patch and po prn. States pain in back. States finished brain radiation, chemotherapy and immune therapy now on  Keytruda. She was relaxed and denied pain today she has a fentanyl patch and also PO for breakthrough which seems to be managing her pain well.  3. Family /Caregiver/Community Supports: Has twin sister and brother, son and grandchildren.   4. Cognitive / Functional decline:  Alert and oriented, forgetful at times.  5. Follow up Palliative Care Visit: Palliative care will continue to follow for goals of care clarification and symptom management. Return 4 weeks or prn.  I spent 35 minutes providing this consultation,  from 1300 to 1335. More than 50% of the time in this consultation was spent coordinating communication.   HISTORY OF PRESENT ILLNESS:  Deborah Nguyen is a 59 y.o. year old female with multiple medical problems including lung cancer, with brain mets, mild cognitive impairment, constipation, MD. Palliative Care was asked to follow this patient by consultation request of Juluis Pitch MD to help address advance care planning and goals of care. This is the initial visit.  CODE STATUS: DNR, has MOST   PPS: 30% HOSPICE ELIGIBILITY/DIAGNOSIS: TBD  PAST MEDICAL HISTORY:  Past Medical History:  Diagnosis  Date  . Anxiety   . Arthritis   . Chronic kidney disease   . Family history of breast cancer    aunt  . GERD (gastroesophageal reflux disease)   . History of DVT of lower extremity    Left leg  . Lung cancer ([place])    with brain mets  . Lung mass   . Malignant hyperthermia   . 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  Muscular dystrophy ([place])   . Muscular dystrophy ([place])     SOCIAL HX:  Social History   Tobacco Use  . Smoking status: Former Smoker    Packs/day: 1.00    Types: Cigarettes    Quit date: 05/2016    Years since quitting: 3.9  . Smokeless tobacco: Never Used  Substance Use Topics  . Alcohol use: No    ALLERGIES:  Allergies  Allergen Reactions  . Morphine And Related Anaphylaxis  . Paxil [Paroxetine Hcl] Other (See Comments)    Hallucinations   . Ciprofloxacin Other (See Comments)    Unknown  . Levaquin [Levofloxacin] Other (See Comments)    Unknown   . 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 She has an increase risk of malignant hyperthermia with anesthesia. She should not receive Propofol.   Donne Hazel [Nefazodone] Other (See Comments)    Unknown   . Biaxin [Clarithromycin] Nausea Only  . Erythromycin Nausea Only  . Penicillins Rash     PERTINENT MEDICATIONS:  Outpatient Encounter Medications as of 04/16/2020  Medication Sig  . acetaminophen (TYLENOL) 325 MG tablet Take 650 mg by mouth every 6 (six) hours as needed.  . citalopram (CELEXA) 40 MG tablet Take 1 tablet (40 mg total) by mouth daily.  Marland Kitchen dexamethasone (DECADRON) 4 MG tablet Take 1 tablet (4 mg total) by mouth every 12 (twelve) hours.  . docusate sodium (COLACE) 100 MG capsule Take 200 mg by mouth 2 (two) times daily.  Marland Kitchen ELIQUIS 5 MG TABS tablet TAKE 1 TABLET BY MOUTH TWICE A DAY  . fentaNYL (DURAGESIC) 12 MCG/HR Place 1 patch onto the skin every 3 (three) days.  . furosemide (LASIX) 20 MG tablet Take 20 mg by mouth as needed.  . methimazole (TAPAZOLE) 5 MG tablet Take 5 mg by mouth daily.  Marland Kitchen OLANZapine (ZYPREXA) 10 MG tablet TAKE ONE TABLET BY MOUTH AT BEDTIME (Patient taking differently: 5 mg. )  . ondansetron (ZOFRAN-ODT) 8 MG disintegrating tablet Take 8 mg by mouth every 6 (six) hours as needed.   . Oxycodone HCl 10 MG TABS Take 1 tablet (10 mg  total) by mouth every 4 (four) hours as needed.  . pantoprazole (PROTONIX) 20 MG tablet TAKE 1 TABLET BY MOUTH 2 TIMES DAILY  . polyethylene glycol (MIRALAX) 17 g packet Take 17 g by mouth 2 (two) times daily.  . [DISCONTINUED] pantoprazole (PROTONIX) 20 MG tablet TAKE 1 TABLET BY MOUTH 2 TIMES DAILY   No facility-administered encounter medications on file as of 04/16/2020.    PHYSICAL EXAM / ROS:   Current and past weights: 133 lbs per hospital record General: NAD, frail appearing,WNWD Cardiovascular: no chest pain reported, no edema  Pulmonary: no cough, no increased SOB, room air  Abdomen: appetite fair, endorses constipation, continent of bowel GU: denies dysuria, continent of urine MSK:  no joint and ROM abnormalities,  ambulatory, needs assist Skin: no rashes or  wounds reported Neurological: Weakness, forgetful, interactive and appropriate,  denies pain currently  Jason Coop, NP [facility]  COVID-19 PATIENT SCREENING TOOL  Person answering questions: ____________Staff_______ _____   1.  Is the patient or any family member in the home showing any signs or symptoms regarding respiratory infection?               Person with Symptom- __________NA_________________  a. Fever                                                                          Yes___ No___          ___________________  b. Shortness of breath                                                    Yes___ No___          ___________________ c. Cough/congestion                                       Yes___  No___         ___________________ d. Body aches/pains                                                         Yes___ No___        ____________________ e. Gastrointestinal symptoms (diarrhea, nausea)           Yes___ No___        ____________________  2. Within the past 14 days, has anyone living in the home had any contact with someone with or under investigation for COVID-19?    Yes___ No_X_   Person  __________________

## 2020-04-17 ENCOUNTER — Telehealth: Payer: Self-pay | Admitting: Primary Care

## 2020-04-17 NOTE — Telephone Encounter (Signed)
Tc from Jearld Fenton, pt sister. She is concerned RE constipation. Patient has reported feeling of bloat and discomfort to sister but not to SNF staff per staff report.  I reviewed my assessment from yesterday and staff at SNF reported a mod. Stool 2 days ago. This of course is only one measure and I told Ms Rolena Infante I'd call and discuss with SNF. Spoke with Berton Bon at [facility] and asked for further assessment of possible obstipation (e.g. KUB, clinical exam by PCP) and suitable intervention such as enema. May need medication targeting OIC. They have increased functional constipation medications as of yesterday. SNF LPN to f/u with PCP [facility] to address bowel management.

## 2020-04-18 ENCOUNTER — Other Ambulatory Visit: Payer: Self-pay

## 2020-04-18 ENCOUNTER — Encounter: Payer: Self-pay | Admitting: Oncology

## 2020-04-18 ENCOUNTER — Inpatient Hospital Stay: Payer: Medicare Other | Attending: Oncology | Admitting: Oncology

## 2020-04-18 VITALS — BP 110/75 | HR 71 | Temp 97.4°F | Resp 16 | Wt 117.5 lb

## 2020-04-18 DIAGNOSIS — C7931 Secondary malignant neoplasm of brain: Secondary | ICD-10-CM | POA: Insufficient documentation

## 2020-04-18 DIAGNOSIS — C7971 Secondary malignant neoplasm of right adrenal gland: Secondary | ICD-10-CM

## 2020-04-18 DIAGNOSIS — G893 Neoplasm related pain (acute) (chronic): Secondary | ICD-10-CM | POA: Diagnosis not present

## 2020-04-18 DIAGNOSIS — Z7189 Other specified counseling: Secondary | ICD-10-CM | POA: Diagnosis not present

## 2020-04-18 DIAGNOSIS — Z79899 Other long term (current) drug therapy: Secondary | ICD-10-CM | POA: Insufficient documentation

## 2020-04-18 DIAGNOSIS — C7972 Secondary malignant neoplasm of left adrenal gland: Secondary | ICD-10-CM | POA: Diagnosis not present

## 2020-04-18 DIAGNOSIS — C3431 Malignant neoplasm of lower lobe, right bronchus or lung: Secondary | ICD-10-CM | POA: Diagnosis present

## 2020-04-18 NOTE — Progress Notes (Signed)
Patient here for oncology follow-up appointment, questions about missing last treatment otherwise expresses no complaints or concerns at this time.

## 2020-04-21 ENCOUNTER — Other Ambulatory Visit: Payer: Self-pay | Admitting: Oncology

## 2020-04-21 ENCOUNTER — Other Ambulatory Visit: Payer: Self-pay | Admitting: Hospice and Palliative Medicine

## 2020-04-21 ENCOUNTER — Telehealth: Payer: Self-pay | Admitting: Primary Care

## 2020-04-21 ENCOUNTER — Telehealth: Payer: Self-pay | Admitting: *Deleted

## 2020-04-21 MED ORDER — FENTANYL 25 MCG/HR TD PT72: 1.0000 | MEDICATED_PATCH | TRANSDERMAL | 0 refills | Status: DC

## 2020-04-21 MED ORDER — FENTANYL 25 MCG/HR TD PT72: 1.0000 | MEDICATED_PATCH | TRANSDERMAL | 0 refills | Status: AC

## 2020-04-21 NOTE — Progress Notes (Signed)
Sister reports that patient's pain is increased while at the SNF. Will increase fentanyl to 33mcg Q72H. #5 patches.

## 2020-04-21 NOTE — Telephone Encounter (Signed)
Deborah Nguyen- can we increase her fentanyl patch to 25 mcg please?

## 2020-04-21 NOTE — Telephone Encounter (Signed)
Call received from Jearld Fenton sister of Deborah Nguyen. She states her sister is in a lot of pain and needs more pain medicine. I directed her to call the nursing home and speak to the nurses there, that their medical director did the prescribing of that at the nursing home. [name] stated that patient has wanted to come home and so will be coming home this Wednesday. I will alert [place] our admin team that she will be returning to her home address.

## 2020-04-21 NOTE — Progress Notes (Signed)
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 She presented to Dr. Wilburn Cornelia symptoms of right flank pain which was radiating to her front which prompted a CT abdomen. CT abdomen pelvis with contrast showed a 3.7 x 2.8 x 5 cm right adrenal mass. 1.7 x 1.2 x 2.1 cm left adrenal nodule 1.8 cm right renal simple cyst. And a new 3.6 x 3.2 cm spiculated mass in the right lower lobe with tethering of the adjacent pleura. Prominent hilar lymph nodes. This was followed by a PET CT scan on 09/20/2019 which showed a 3.6 cm right lower lobe mass which was markedly hypermetabolic along with hypermetabolic metastatic disease in the right supraclavicular space as well as mediastinum and right hilum and hypermetabolic metastatic disease in bilateral adrenal glands. She will also noted to have bilateral hypermetabolic thyroid nodules and a possible nodule in the pelvis adjacent to the sigmoid colon.  Patient currently reports ongoing pain in her right flank which is relatively well controlled with hydrocodone. She also has occasional nausea for which she has as needed nausea medications. She is here with her twin sister today and is highly anxious Of note patient has a history of central core myopathy and follows up with neuromuscular clinic at [facility]. Patient states that her twin sister has had episodes of  hyperthermia but patient herself has not had these episodes. I did review [facility] neuromuscular clinic note and the hyperthermia has been attributed to her history of hyperthyroidism and thyroid dysfunction rather than malignant hyperthermia. However given her history of myopathy patient does carry risk of malignant hyperthermia.  Not enough tissue was available for NGS testing. Patient therefore underwentLiquid biopsy which showed evidence of PI K3 CA amplification, RIC POR amplification, T p53  Patient developed significant scalp and body rash after cycle 1 of carbotaxol Keytruda. This resolved after a trial of steroids and Keytruda was held for cycle 2. Interim scans after4 cycles showed response to treatment.  Patient is currently on maintenance Keytruda   Interval history-patient was recently admitted to the hospital for generalized weakness and discharged to a subacute rehab.  She has been at the rehab for about a week.  Patient reports she is doing well and is able to ambulate around her room as well as outside with the help of a walker.  She has not had any falls.  Reports that she is having a good oral intake.  She has ongoing right flank pain for which she uses pain medicines.  Bowel movements have been irregular and patient finally had a good bowel movement after a week of constipation  ECOG PS- 2 Pain scale- 3 Opioid associated constipation- yes  Review of systems- Review of Systems  Constitutional: Positive for malaise/fatigue. Negative for chills, fever and weight loss.  HENT: Negative for congestion, ear discharge and nosebleeds.   Eyes: Negative for blurred vision.  Respiratory: Negative for cough, hemoptysis, sputum production, shortness of breath and wheezing.  Cardiovascular: Negative for chest pain, palpitations, orthopnea and claudication.  Gastrointestinal: Positive for constipation. Negative for abdominal pain, blood in stool, diarrhea, heartburn, melena, nausea and  vomiting.  Genitourinary: Negative for dysuria, flank pain, frequency, hematuria and urgency.  Musculoskeletal: Negative for back pain, joint pain and myalgias.       Right flank pain  Skin: Negative for rash.  Neurological: Negative for dizziness, tingling, focal weakness, seizures, weakness and headaches.  Endo/Heme/Allergies: Does not bruise/bleed easily.  Psychiatric/Behavioral: Negative for depression and suicidal ideas. The patient does not have insomnia.       Allergies  Allergen Reactions  . Morphine And Related Anaphylaxis  . Paxil [Paroxetine Hcl] Other (See Comments)    Hallucinations   . Ciprofloxacin Other (See Comments)    Unknown  . Levaquin [Levofloxacin] Other (See Comments)    Unknown   . 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 She has an increase risk of malignant hyperthermia with anesthesia. She should not receive Propofol.   Donne Hazel [Nefazodone] Other (See Comments)    Unknown   . Biaxin [Clarithromycin] Nausea Only  . Erythromycin Nausea Only  . Penicillins Rash     Past Medical History:  Diagnosis Date  . Anxiety   . Arthritis   . Chronic kidney disease   . Family history of breast cancer    aunt  . GERD (gastroesophageal reflux disease)   . History of DVT of lower extremity    Left leg  . Lung cancer ([place])    with brain mets  . Lung mass   . Malignant hyperthermia   . 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 Muscular dystrophy ([place])   . Muscular dystrophy [facility])      Past Surgical History:  Procedure Laterality Date  . CESAREAN SECTION  11/1985  . COLONOSCOPY N/A 10/27/2015   Procedure: COLONOSCOPY;  Surgeon: Manya Silvas, MD;  Location: [facility] ENDOSCOPY;  Service: Endoscopy;  Laterality: N/A;   NO Propofol - per office  . LITHOTRIPSY    . PORTA CATH INSERTION N/A 11/09/2019   Procedure: PORTA CATH INSERTION;  Surgeon: Algernon Huxley, MD;  Location: [place] CV LAB;  Service: Cardiovascular;  Laterality: N/A;  . PORTA CATH INSERTION N/A 01/23/2020   Procedure: PORTA CATH INSERTION;  Surgeon: Algernon Huxley, MD;  Location: [place] CV LAB;  Service: Cardiovascular;  Laterality: N/A;  . PORTA CATH INSERTION N/A 02/01/2020   Procedure: PORTA CATH INSERTION;  Surgeon: Algernon Huxley, MD;  Location: [place] CV LAB;  Service: Cardiovascular;  Laterality: N/A;  . TUBAL LIGATION  03/1986    Social History   Socioeconomic History  . Marital status: Divorced    Spouse name: Not on file  . Number of children: Not on file  . Years of education: Not on file  . Highest education level: Not on file  Occupational History  . Not on file  Tobacco Use  . Smoking status: Former Smoker    Packs/day: 1.00    Types: Cigarettes    Quit date: 05/2016    Years since quitting: 3.9  . Smokeless tobacco: Never Used  Substance and Sexual Activity  . Alcohol use: No  . Drug use: No  . Sexual activity: Not Currently  Other Topics Concern  . Not on file  Social History Narrative  . Not on file  Social Determinants of Health   Financial Resource Strain:   . Difficulty of Paying Living Expenses:   Food Insecurity:   . Worried About Charity fundraiser in the Last Year:   . Arboriculturist in the Last Year:   Transportation Needs:   . Film/video editor (Medical):   Marland Kitchen Lack of Transportation (Non-Medical):   Physical Activity:   . Days of Exercise per Week:   . Minutes of Exercise per Session:   Stress:   . Feeling of Stress :   Social Connections:   . Frequency of Communication with Friends and Family:   . Frequency of Social Gatherings with Friends and Family:   . Attends Religious Services:   . Active Member of Clubs or Organizations:   . Attends Archivist Meetings:   Marland Kitchen Marital Status:   Intimate Partner Violence:   . Fear of Current or Ex-Partner:   . Emotionally Abused:   Marland Kitchen Physically Abused:    . Sexually Abused:     Family History  Problem Relation Age of Onset  . Cancer Father        bladder, lung  . Breast cancer Maternal Aunt 45     Current Outpatient Medications:  .  acetaminophen (TYLENOL) 325 MG tablet, Take 650 mg by mouth every 6 (six) hours as needed., Disp: , Rfl:  .  citalopram (CELEXA) 40 MG tablet, Take 1 tablet (40 mg total) by mouth daily., Disp: 30 tablet, Rfl: 3 .  docusate sodium (COLACE) 100 MG capsule, Take 200 mg by mouth 2 (two) times daily., Disp: , Rfl:  .  ELIQUIS 5 MG TABS tablet, TAKE 1 TABLET BY MOUTH TWICE A DAY, Disp: 60 tablet, Rfl: 2 .  fentaNYL (DURAGESIC) 12 MCG/HR, Place 1 patch onto the skin every 3 (three) days., Disp: 1 patch, Rfl: 0 .  methimazole (TAPAZOLE) 5 MG tablet, Take 5 mg by mouth daily., Disp: , Rfl:  .  OLANZapine (ZYPREXA) 10 MG tablet, TAKE ONE TABLET BY MOUTH AT BEDTIME (Patient taking differently: 5 mg. ), Disp: 30 tablet, Rfl: 1 .  ondansetron (ZOFRAN-ODT) 8 MG disintegrating tablet, Take 8 mg by mouth every 6 (six) hours as needed. , Disp: , Rfl:  .  Oxycodone HCl 10 MG TABS, Take 1 tablet (10 mg total) by mouth every 4 (four) hours as needed., Disp: 12 tablet, Rfl: 0 .  pantoprazole (PROTONIX) 20 MG tablet, TAKE 1 TABLET BY MOUTH 2 TIMES DAILY, Disp: 120 tablet, Rfl: 0 .  polyethylene glycol (MIRALAX) 17 g packet, Take 17 g by mouth 2 (two) times daily., Disp: , Rfl:  .  dexamethasone (DECADRON) 4 MG tablet, Take 1 tablet (4 mg total) by mouth every 12 (twelve) hours. (Patient not taking: Reported on 04/18/2020), Disp:  , Rfl:  .  furosemide (LASIX) 20 MG tablet, Take 20 mg by mouth as needed., Disp: , Rfl:   Physical exam:  Vitals:   04/18/20 0925  BP: 110/75  Pulse: 71  Resp: 16  Temp: (!) 97.4 F (36.3 C)  TempSrc: Tympanic  SpO2: 99%  Weight: 117 lb 8 oz (53.3 kg)   Physical Exam Constitutional:      Comments: Patient is sitting in a wheelchair.  Appears in no acute distress  Cardiovascular:     Rate  and Rhythm: Normal rate and regular rhythm.     Heart sounds: Normal heart sounds.  Pulmonary:     Effort: Pulmonary effort is normal.  Breath sounds: Normal breath sounds.  Abdominal:     General: Bowel sounds are normal.     Palpations: Abdomen is soft.  Skin:    General: Skin is warm and dry.  Neurological:     Mental Status: She is alert and oriented to person, place, and time.      CMP Latest Ref Rng & Units 04/07/2020  Glucose 70 - 99 mg/dL 155(H)  BUN 6 - 20 mg/dL 13  Creatinine 0.44 - 1.00 mg/dL 0.39(L)  Sodium 135 - 145 mmol/L 140  Potassium 3.5 - 5.1 mmol/L 4.2  Chloride 98 - 111 mmol/L 106  CO2 22 - 32 mmol/L 28  Calcium 8.9 - 10.3 mg/dL 10.2  Total Protein 6.5 - 8.1 g/dL -  Total Bilirubin 0.3 - 1.2 mg/dL -  Alkaline Phos 38 - 126 U/L -  AST 15 - 41 U/L -  ALT 0 - 44 U/L -   CBC Latest Ref Rng & Units 04/05/2020  WBC 4.0 - 10.5 K/uL 9.8  Hemoglobin 12.0 - 15.0 g/dL 10.4(L)  Hematocrit 36.0 - 46.0 % 32.8(L)  Platelets 150 - 400 K/uL 339    No images are attached to the encounter.  CT Head Wo Contrast  Result Date: 04/06/2020 CLINICAL DATA:  Weakness. EXAM: CT HEAD WITHOUT CONTRAST TECHNIQUE: Contiguous axial images were obtained from the base of the skull through the vertex without intravenous contrast. COMPARISON:  None. FINDINGS: Brain: There is mild cerebral atrophy with widening of the extra-axial spaces and ventricular dilatation. There are areas of decreased attenuation within the white matter tracts of the supratentorial brain, consistent with microvascular disease changes. Vascular: No hyperdense vessel or unexpected calcification. Skull: Normal. Negative for fracture or focal lesion. Sinuses/Orbits: No acute finding. Other: None. IMPRESSION: 1. No acute intracranial abnormality. 2. Mild cerebral atrophy and microvascular disease changes of the supratentorial brain. Electronically Signed   By: Virgina Norfolk M.D.   On: 04/06/2020 01:12   MR Brain W Wo  Contrast  Result Date: 03/25/2020 CLINICAL DATA:  Lung cancer with brain metastasis. Follow-up. EXAM: MRI HEAD WITHOUT AND WITH CONTRAST TECHNIQUE: Multiplanar, multiecho pulse sequences of the brain and surrounding structures were obtained without and with intravenous contrast. CONTRAST:  27m GADAVIST GADOBUTROL 1 MMOL/ML IV SOLN COMPARISON:  MRI of the brain September 26, 2019. FINDINGS: Brain: No acute infarction, hemorrhage, hydrocephalus or extra-axial collection. Previously described small lesions in the temporal lobes, 2 on the left and 1 on the right, are no longer seen. Previously described lesion in the left posterior basal ganglia is no longer seen. Interval decrease in size of left occipital enhancing lesion, now measuring 2 mm (4 mm on prior). Mild decrease in size of lesion in the right parietal cortex measuring 2 mm (3 mm on prior). Interval development of a 2 mm enhancing lesion in the right superior frontal gyrus and a 6 mm rim enhancing lesion in the right middle frontal gyrus with surrounding vasogenic edema. Interval development of a 3 mm lesion in the deep white matter of the left frontal lobe. Interval development of 4.8 mm enhancing lesion in the right cerebellar hemisphere. Vascular: Normal flow voids. Skull and upper cervical spine: Normal marrow signal. Sinuses/Orbits: Negative. Other: Bilateral mastoid effusions. IMPRESSION: Mixed interval treatment response with regression of bilateral temporal lobe and left posterior basal ganglia enhancing lesions, interval decrease in size of left occipital lobe and right parietal cortex lesions. Interval development of Joe right frontal lobe lesion, 1 left frontal lobe lesion and  1 right cerebellar hemisphere lesion. These results will be called to the ordering clinician or representative by the Radiologist Assistant, and communication documented in the PACS or [employer]. Electronically Signed   By: Pedro Earls M.D.   On:  03/25/2020 16:21   DG Chest Portable 1 View  Result Date: 04/05/2020 CLINICAL DATA:  Weakness. History of lung cancer. Progressive weakness. EXAM: PORTABLE CHEST 1 VIEW COMPARISON:  Radiograph 12/27/2019. CT 01/28/2020 FINDINGS: Tip of the right chest port in the SVC. Mild elevation of right hemidiaphragm with streaky opacity at the right lung base, corresponding to nodule on CT. Minor streaky left lung base atelectasis. The heart is normal in size. Normal mediastinal contours. No pleural fluid or pneumothorax. No acute osseous abnormalities are seen. IMPRESSION: 1. Mild elevation of the right hemidiaphragm with streaky opacity at the right lung base, corresponding to nodule on CT. 2. Minor streaky left lung base atelectasis. Electronically Signed   By: Keith Rake M.D.   On: 04/05/2020 23:55   US Abdomen Limited RUQ  Result Date: 04/06/2020 CLINICAL DATA:  Right upper quadrant pain and nausea. EXAM: ULTRASOUND ABDOMEN LIMITED RIGHT UPPER QUADRANT COMPARISON:  CT 01/28/2020 FINDINGS: Gallbladder: No gallstones or wall thickening visualized. No sonographic Murphy sign noted by sonographer. Common bile duct: Diameter: 8 mm unchanged. Liver: Mild increased parenchymal echogenicity centrally suggesting steatosis. No focal mass. Portal vein is patent on color Doppler imaging with normal direction of blood flow towards the liver. Other: Incidental right renal cyst unchanged. IMPRESSION: No acute findings. Electronically Signed   By: Marin Olp M.D.   On: 04/06/2020 16:24     Assessment and plan- Patient is a 59 y.o. 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  She is here for post hospital discharge follow-up  1.  Patient's treatment has been on hold since 03/18/2020.  When she was due for her next treatment she was hospitalized and currently in a rehab.  Her last systemic scans showed stable disease and I will plan to get  repeat set of scans In 1 to 2 weeks time.  If patient's performance status remains stable and scans show ongoing response to treatment it would be reasonable to continue Keytruda.  If scans show progression we will have to see if patient is strong enough to tolerate second line chemotherapy.  2.  Also patient's recent brain MRI showed 3 of her old brain mets had improved but patient had 3 subcentimeter new brain metastases.  I had gotten in touch with radiation oncology and there is no plan for reirradiation at this time.  I will plan to repeat her MRI again in 2 months time  Further plans to restart Beryle Flock will depend on when patient comes out of the rehab.  Patient understands that if her performance status is poor and she is unable to remain independent at home hospice is a consideration.  For now plan is for patient to come out of the rehab and go home and will be cared for by her sister   Visit Diagnosis 1. Malignant neoplasm of lower lobe of right lung ([place])   2. Malignant neoplasm metastatic to both adrenal glands ([place])   3. Neoplasm related pain   4. Goals of care, counseling/discussion      Dr. Randa Evens, MD, MPH [facility] [phone] 04/21/2020 8:39 AM

## 2020-04-21 NOTE — Telephone Encounter (Signed)
Sister called reporting that patient is in [employer] and that she is having pain and is requesting to increase her Fentanyl patch dose. Please advise

## 2020-04-23 ENCOUNTER — Other Ambulatory Visit: Payer: Self-pay | Admitting: Oncology

## 2020-04-24 ENCOUNTER — Telehealth: Payer: Self-pay | Admitting: *Deleted

## 2020-04-24 ENCOUNTER — Telehealth: Payer: Self-pay | Admitting: Hospice and Palliative Medicine

## 2020-04-24 MED ORDER — PREDNISONE 10 MG PO TABS: 10.0000 mg | ORAL_TABLET | Freq: Two times a day (BID) | ORAL | 0 refills | Status: AC

## 2020-04-24 NOTE — Telephone Encounter (Signed)
Deborah Nguyen called asking if t should be restarted on Steroids since her pain is out of control. Please advise

## 2020-04-24 NOTE — Telephone Encounter (Signed)
[place] she can take prednisone 8 mg twice daily for 1 week. Can you send the prescription?

## 2020-04-24 NOTE — Telephone Encounter (Signed)
Daughter in Com[employer] called asking for a referral to [facility] for this patient. Please send if appropriate. Patient was to discharge to home from facility yesterday

## 2020-04-24 NOTE — Telephone Encounter (Signed)
Sorry prednisone 10 mg BID is good

## 2020-04-24 NOTE — Telephone Encounter (Signed)
Spoke with patient sister by phone.  She reports that patient had increased pain overnight that improved some after she gave 2 tablets of oxycodone.  She says today patient has been weaker and more dependent upon family/friends for care.  Sister spoke with patient about the option of coming to the hospital and patient refused.  Patient is scheduled for follow-up tomorrow in the clinic with plan to check labs and for her to see Dr. Janese Banks.  Patient was reportedly referred to home health PT when she discharged home from the SNF.  We have previously pursued trying to get her caps services through [facility].  We will have RN follow-up regarding that referral.

## 2020-04-24 NOTE — Addendum Note (Signed)
Addended by: Betti Cruz on: 04/24/2020 03:15 PM   Modules accepted: Orders

## 2020-04-24 NOTE — Telephone Encounter (Signed)
This is a Press photographer patient

## 2020-04-24 NOTE — Telephone Encounter (Signed)
Prednisone comes in 5 and 10 mg dosing, do you want Decadron 8 mg?

## 2020-04-24 NOTE — Telephone Encounter (Signed)
I called the daughter and spoke to her about the PCS-it was put on hold while she was in nursing home. I called today and they said they would have to talk to pt to set it back up. The daughter states that they ae coming out Monday to eval. Then I called [employer] and spoke to [place] and she states that will go in but are waiting on insurance approval for them to begin. Both companies can come in and take care of her, I am not sure the start date. Daughter is glad that someone is helping.

## 2020-04-25 ENCOUNTER — Inpatient Hospital Stay: Payer: Medicare Other | Admitting: Hospice and Palliative Medicine

## 2020-04-25 ENCOUNTER — Telehealth: Payer: Self-pay | Admitting: Adult Health Nurse Practitioner

## 2020-04-25 ENCOUNTER — Inpatient Hospital Stay: Payer: Medicare Other | Admitting: Oncology

## 2020-04-25 ENCOUNTER — Emergency Department: Payer: Medicare Other

## 2020-04-25 ENCOUNTER — Encounter: Payer: Self-pay | Admitting: Emergency Medicine

## 2020-04-25 ENCOUNTER — Other Ambulatory Visit: Payer: Self-pay

## 2020-04-25 ENCOUNTER — Inpatient Hospital Stay: Payer: Medicare Other

## 2020-04-25 ENCOUNTER — Inpatient Hospital Stay
Admission: EM | Admit: 2020-04-25 | Discharge: 2020-05-06 | DRG: 871 | Disposition: E | Payer: Medicare Other | Attending: Internal Medicine | Admitting: Internal Medicine

## 2020-04-25 DIAGNOSIS — F418 Other specified anxiety disorders: Secondary | ICD-10-CM

## 2020-04-25 DIAGNOSIS — K219 Gastro-esophageal reflux disease without esophagitis: Secondary | ICD-10-CM | POA: Diagnosis present

## 2020-04-25 DIAGNOSIS — E0590 Thyrotoxicosis, unspecified without thyrotoxic crisis or storm: Secondary | ICD-10-CM | POA: Diagnosis present

## 2020-04-25 DIAGNOSIS — Z7401 Bed confinement status: Secondary | ICD-10-CM

## 2020-04-25 DIAGNOSIS — Z9221 Personal history of antineoplastic chemotherapy: Secondary | ICD-10-CM | POA: Diagnosis not present

## 2020-04-25 DIAGNOSIS — D649 Anemia, unspecified: Secondary | ICD-10-CM

## 2020-04-25 DIAGNOSIS — Y95 Nosocomial condition: Secondary | ICD-10-CM | POA: Diagnosis present

## 2020-04-25 DIAGNOSIS — R531 Weakness: Secondary | ICD-10-CM | POA: Diagnosis not present

## 2020-04-25 DIAGNOSIS — Z803 Family history of malignant neoplasm of breast: Secondary | ICD-10-CM | POA: Diagnosis not present

## 2020-04-25 DIAGNOSIS — Z880 Allergy status to penicillin: Secondary | ICD-10-CM | POA: Diagnosis not present

## 2020-04-25 DIAGNOSIS — G7100 Muscular dystrophy, unspecified: Secondary | ICD-10-CM | POA: Diagnosis present

## 2020-04-25 DIAGNOSIS — Z20822 Contact with and (suspected) exposure to covid-19: Secondary | ICD-10-CM | POA: Diagnosis present

## 2020-04-25 DIAGNOSIS — C787 Secondary malignant neoplasm of liver and intrahepatic bile duct: Secondary | ICD-10-CM | POA: Diagnosis present

## 2020-04-25 DIAGNOSIS — Z7901 Long term (current) use of anticoagulants: Secondary | ICD-10-CM | POA: Diagnosis not present

## 2020-04-25 DIAGNOSIS — Z79899 Other long term (current) drug therapy: Secondary | ICD-10-CM

## 2020-04-25 DIAGNOSIS — Z86718 Personal history of other venous thrombosis and embolism: Secondary | ICD-10-CM

## 2020-04-25 DIAGNOSIS — Z66 Do not resuscitate: Secondary | ICD-10-CM | POA: Diagnosis present

## 2020-04-25 DIAGNOSIS — C7801 Secondary malignant neoplasm of right lung: Secondary | ICD-10-CM | POA: Diagnosis not present

## 2020-04-25 DIAGNOSIS — J180 Bronchopneumonia, unspecified organism: Secondary | ICD-10-CM | POA: Diagnosis present

## 2020-04-25 DIAGNOSIS — R54 Age-related physical debility: Secondary | ICD-10-CM | POA: Diagnosis present

## 2020-04-25 DIAGNOSIS — C7972 Secondary malignant neoplasm of left adrenal gland: Secondary | ICD-10-CM | POA: Diagnosis present

## 2020-04-25 DIAGNOSIS — C3431 Malignant neoplasm of lower lobe, right bronchus or lung: Secondary | ICD-10-CM | POA: Diagnosis present

## 2020-04-25 DIAGNOSIS — Z87891 Personal history of nicotine dependence: Secondary | ICD-10-CM

## 2020-04-25 DIAGNOSIS — Z95828 Presence of other vascular implants and grafts: Secondary | ICD-10-CM

## 2020-04-25 DIAGNOSIS — Z86010 Personal history of colonic polyps: Secondary | ICD-10-CM

## 2020-04-25 DIAGNOSIS — D630 Anemia in neoplastic disease: Secondary | ICD-10-CM | POA: Diagnosis present

## 2020-04-25 DIAGNOSIS — J9621 Acute and chronic respiratory failure with hypoxia: Secondary | ICD-10-CM | POA: Diagnosis present

## 2020-04-25 DIAGNOSIS — E039 Hypothyroidism, unspecified: Secondary | ICD-10-CM | POA: Diagnosis present

## 2020-04-25 DIAGNOSIS — Z888 Allergy status to other drugs, medicaments and biological substances status: Secondary | ICD-10-CM | POA: Diagnosis not present

## 2020-04-25 DIAGNOSIS — Z923 Personal history of irradiation: Secondary | ICD-10-CM

## 2020-04-25 DIAGNOSIS — A419 Sepsis, unspecified organism: Principal | ICD-10-CM

## 2020-04-25 DIAGNOSIS — K5900 Constipation, unspecified: Secondary | ICD-10-CM | POA: Diagnosis present

## 2020-04-25 DIAGNOSIS — J189 Pneumonia, unspecified organism: Secondary | ICD-10-CM | POA: Diagnosis not present

## 2020-04-25 DIAGNOSIS — Z87442 Personal history of urinary calculi: Secondary | ICD-10-CM

## 2020-04-25 DIAGNOSIS — Z515 Encounter for palliative care: Secondary | ICD-10-CM | POA: Diagnosis not present

## 2020-04-25 DIAGNOSIS — T451X5A Adverse effect of antineoplastic and immunosuppressive drugs, initial encounter: Secondary | ICD-10-CM | POA: Diagnosis present

## 2020-04-25 DIAGNOSIS — F411 Generalized anxiety disorder: Secondary | ICD-10-CM | POA: Diagnosis present

## 2020-04-25 DIAGNOSIS — F329 Major depressive disorder, single episode, unspecified: Secondary | ICD-10-CM | POA: Diagnosis present

## 2020-04-25 DIAGNOSIS — Y929 Unspecified place or not applicable: Secondary | ICD-10-CM

## 2020-04-25 DIAGNOSIS — R109 Unspecified abdominal pain: Secondary | ICD-10-CM

## 2020-04-25 DIAGNOSIS — C7971 Secondary malignant neoplasm of right adrenal gland: Secondary | ICD-10-CM | POA: Diagnosis present

## 2020-04-25 DIAGNOSIS — C3490 Malignant neoplasm of unspecified part of unspecified bronchus or lung: Secondary | ICD-10-CM

## 2020-04-25 DIAGNOSIS — C7931 Secondary malignant neoplasm of brain: Secondary | ICD-10-CM | POA: Diagnosis present

## 2020-04-25 DIAGNOSIS — R1011 Right upper quadrant pain: Secondary | ICD-10-CM | POA: Diagnosis present

## 2020-04-25 DIAGNOSIS — Z885 Allergy status to narcotic agent status: Secondary | ICD-10-CM

## 2020-04-25 LAB — URINALYSIS, COMPLETE (UACMP) WITH MICROSCOPIC
Bacteria, UA: NONE SEEN
Bilirubin Urine: NEGATIVE
Glucose, UA: NEGATIVE mg/dL
Hgb urine dipstick: NEGATIVE
Ketones, ur: NEGATIVE mg/dL
Leukocytes,Ua: NEGATIVE
Nitrite: NEGATIVE
Protein, ur: NEGATIVE mg/dL
Specific Gravity, Urine: 1.021 (ref 1.005–1.030)
pH: 5 (ref 5.0–8.0)

## 2020-04-25 LAB — CBC WITH DIFFERENTIAL/PLATELET
Abs Immature Granulocytes: 0.22 10*3/uL — ABNORMAL HIGH (ref 0.00–0.07)
Basophils Absolute: 0.1 10*3/uL (ref 0.0–0.1)
Basophils Relative: 0 %
Eosinophils Absolute: 0.1 10*3/uL (ref 0.0–0.5)
Eosinophils Relative: 0 %
HCT: 35.7 % — ABNORMAL LOW (ref 36.0–46.0)
Hemoglobin: 11.4 g/dL — ABNORMAL LOW (ref 12.0–15.0)
Immature Granulocytes: 1 %
Lymphocytes Relative: 3 %
Lymphs Abs: 0.6 10*3/uL — ABNORMAL LOW (ref 0.7–4.0)
MCH: 30.4 pg (ref 26.0–34.0)
MCHC: 31.9 g/dL (ref 30.0–36.0)
MCV: 95.2 fL (ref 80.0–100.0)
Monocytes Absolute: 0.9 10*3/uL (ref 0.1–1.0)
Monocytes Relative: 4 %
Neutro Abs: 19.5 10*3/uL — ABNORMAL HIGH (ref 1.7–7.7)
Neutrophils Relative %: 92 %
Platelets: 279 10*3/uL (ref 150–400)
RBC: 3.75 MIL/uL — ABNORMAL LOW (ref 3.87–5.11)
RDW: 13.7 % (ref 11.5–15.5)
WBC: 21.3 10*3/uL — ABNORMAL HIGH (ref 4.0–10.5)
nRBC: 0 % (ref 0.0–0.2)

## 2020-04-25 LAB — PROTIME-INR
INR: 1.5 — ABNORMAL HIGH (ref 0.8–1.2)
Prothrombin Time: 17.8 seconds — ABNORMAL HIGH (ref 11.4–15.2)

## 2020-04-25 LAB — COMPREHENSIVE METABOLIC PANEL
ALT: 35 U/L (ref 0–44)
AST: 34 U/L (ref 15–41)
Albumin: 2.7 g/dL — ABNORMAL LOW (ref 3.5–5.0)
Alkaline Phosphatase: 151 U/L — ABNORMAL HIGH (ref 38–126)
Anion gap: 10 (ref 5–15)
BUN: 25 mg/dL — ABNORMAL HIGH (ref 6–20)
CO2: 28 mmol/L (ref 22–32)
Calcium: 11 mg/dL — ABNORMAL HIGH (ref 8.9–10.3)
Chloride: 97 mmol/L — ABNORMAL LOW (ref 98–111)
Creatinine, Ser: 0.37 mg/dL — ABNORMAL LOW (ref 0.44–1.00)
GFR calc Af Amer: >60 mL/min (ref >60)
GFR calc non Af Amer: >60 mL/min (ref >60)
Glucose, Bld: 112 mg/dL — ABNORMAL HIGH (ref 70–99)
Potassium: 5 mmol/L (ref 3.5–5.1)
Sodium: 135 mmol/L (ref 135–145)
Total Bilirubin: 0.9 mg/dL (ref 0.3–1.2)
Total Protein: 7.7 g/dL (ref 6.5–8.1)

## 2020-04-25 LAB — PROCALCITONIN: Procalcitonin: 0.58 ng/mL

## 2020-04-25 LAB — LACTIC ACID, PLASMA: Lactic Acid, Venous: 1.3 mmol/L (ref 0.5–1.9)

## 2020-04-25 LAB — SARS CORONAVIRUS 2 BY RT PCR (HOSPITAL ORDER, PERFORMED IN ~~LOC~~ HOSPITAL LAB): SARS Coronavirus 2: NEGATIVE

## 2020-04-25 MED ORDER — DOCUSATE SODIUM 100 MG PO CAPS
200.0000 mg | ORAL_CAPSULE | Freq: Two times a day (BID) | ORAL | Status: DC
  Administered 2020-04-25 (×2): 200 mg via ORAL
  Filled 2020-04-25 (×3): qty 2

## 2020-04-25 MED ORDER — OLANZAPINE 5 MG PO TABS
5.0000 mg | ORAL_TABLET | Freq: Every day | ORAL | Status: DC
  Administered 2020-04-25: 5 mg via ORAL
  Filled 2020-04-25 (×2): qty 1

## 2020-04-25 MED ORDER — SODIUM CHLORIDE 0.9 % IV SOLN
2.0000 g | Freq: Once | INTRAVENOUS | Status: AC
  Administered 2020-04-25: 2 g via INTRAVENOUS
  Filled 2020-04-25: qty 2

## 2020-04-25 MED ORDER — LACTATED RINGERS IV BOLUS (SEPSIS)
1000.0000 mL | Freq: Once | INTRAVENOUS | Status: AC
  Administered 2020-04-25: 1000 mL via INTRAVENOUS

## 2020-04-25 MED ORDER — SODIUM CHLORIDE 0.9 % IV SOLN: INTRAVENOUS | Status: DC

## 2020-04-25 MED ORDER — APIXABAN 5 MG PO TABS
5.0000 mg | ORAL_TABLET | Freq: Two times a day (BID) | ORAL | Status: DC
  Administered 2020-04-25 (×2): 5 mg via ORAL
  Filled 2020-04-25 (×2): qty 1

## 2020-04-25 MED ORDER — VANCOMYCIN HCL 500 MG/100ML IV SOLN
500.0000 mg | Freq: Two times a day (BID) | INTRAVENOUS | Status: DC
  Filled 2020-04-25 (×4): qty 100

## 2020-04-25 MED ORDER — ACETAMINOPHEN 325 MG PO TABS: 650.0000 mg | ORAL_TABLET | Freq: Four times a day (QID) | ORAL | Status: DC | PRN

## 2020-04-25 MED ORDER — PREDNISONE 20 MG PO TABS: 10.0000 mg | ORAL_TABLET | Freq: Two times a day (BID) | ORAL | Status: DC

## 2020-04-25 MED ORDER — HYDROMORPHONE HCL 1 MG/ML IJ SOLN
0.5000 mg | INTRAMUSCULAR | Status: DC | PRN
  Administered 2020-04-25 – 2020-04-26 (×4): 0.5 mg via INTRAVENOUS
  Filled 2020-04-25 (×3): qty 0.5
  Filled 2020-04-25: qty 1

## 2020-04-25 MED ORDER — DM-GUAIFENESIN ER 30-600 MG PO TB12: 1.0000 | ORAL_TABLET | Freq: Two times a day (BID) | ORAL | Status: DC | PRN

## 2020-04-25 MED ORDER — SODIUM CHLORIDE 0.9 % IV SOLN
2.0000 g | Freq: Three times a day (TID) | INTRAVENOUS | Status: DC
  Filled 2020-04-25 (×3): qty 2

## 2020-04-25 MED ORDER — IOHEXOL 300 MG/ML  SOLN
100.0000 mL | Freq: Once | INTRAMUSCULAR | Status: AC | PRN
  Administered 2020-04-25: 100 mL via INTRAVENOUS

## 2020-04-25 MED ORDER — OXYCODONE HCL 5 MG PO TABS: 10.0000 mg | ORAL_TABLET | ORAL | Status: DC | PRN

## 2020-04-25 MED ORDER — CITALOPRAM HYDROBROMIDE 20 MG PO TABS
40.0000 mg | ORAL_TABLET | Freq: Every day | ORAL | Status: DC
  Administered 2020-04-25: 40 mg via ORAL
  Filled 2020-04-25: qty 2

## 2020-04-25 MED ORDER — SENNA 8.6 MG PO TABS
1.0000 | ORAL_TABLET | Freq: Every day | ORAL | Status: DC
  Filled 2020-04-25 (×2): qty 1

## 2020-04-25 MED ORDER — ONDANSETRON HCL 4 MG/2ML IJ SOLN: 4.0000 mg | Freq: Three times a day (TID) | INTRAMUSCULAR | Status: DC | PRN

## 2020-04-25 MED ORDER — VANCOMYCIN HCL IN DEXTROSE 1-5 GM/200ML-% IV SOLN
1000.0000 mg | Freq: Once | INTRAVENOUS | Status: AC
  Administered 2020-04-25: 1000 mg via INTRAVENOUS
  Filled 2020-04-25: qty 200

## 2020-04-25 MED ORDER — METHIMAZOLE 5 MG PO TABS
5.0000 mg | ORAL_TABLET | Freq: Every day | ORAL | Status: DC
  Filled 2020-04-25 (×4): qty 1

## 2020-04-25 MED ORDER — METRONIDAZOLE IN NACL 5-0.79 MG/ML-% IV SOLN
500.0000 mg | Freq: Once | INTRAVENOUS | Status: AC
  Administered 2020-04-25: 500 mg via INTRAVENOUS
  Filled 2020-04-25: qty 100

## 2020-04-25 MED ORDER — ALBUTEROL SULFATE HFA 108 (90 BASE) MCG/ACT IN AERS
2.0000 | INHALATION_SPRAY | RESPIRATORY_TRACT | Status: DC | PRN
  Filled 2020-04-25: qty 6.7

## 2020-04-25 MED ORDER — POLYETHYLENE GLYCOL 3350 17 G PO PACK: 17.0000 g | PACK | Freq: Every day | ORAL | Status: DC

## 2020-04-25 MED ORDER — IPRATROPIUM BROMIDE 0.02 % IN SOLN
2.5000 mL | Freq: Four times a day (QID) | RESPIRATORY_TRACT | Status: DC
  Administered 2020-04-25: 0.5 mg via RESPIRATORY_TRACT
  Filled 2020-04-25 (×2): qty 2.5

## 2020-04-25 MED ORDER — FENTANYL 25 MCG/HR TD PT72: 1.0000 | MEDICATED_PATCH | TRANSDERMAL | Status: DC

## 2020-04-25 MED ORDER — PANTOPRAZOLE SODIUM 20 MG PO TBEC
20.0000 mg | DELAYED_RELEASE_TABLET | Freq: Two times a day (BID) | ORAL | Status: DC
  Filled 2020-04-25 (×3): qty 1

## 2020-04-25 NOTE — ED Notes (Signed)
Family at bedside. 

## 2020-04-25 NOTE — Progress Notes (Signed)
Chaplain met patient in ED just prior to being transferred to 2-C. Chaplain provided emotional support, pray for calm and peace, and rest.  

## 2020-04-25 NOTE — Consult Note (Addendum)
[facility]  [phone] [phone]   Name: Deborah Nguyen Date: 04/30/2020 MRN: [id number]  DOB: January 13, 1961  Patient Care Team: Tracie Harrier, MD as PCP - General (Internal Medicine) Telford Nab, RN as Registered Nurse Sindy Guadeloupe, MD as Consulting Physician (Hematology and Oncology)   REASON FOR CONSULTATION: Deborah Nguyen is a 59 y.o. 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   Patient is status post whole brain radiation and chemotherapy and is most recently treated with pembrolizumab.  Patient was recently hospitalized 04/05/2020 to 04/10/2020 with fever of unknown origin.  She was subsequently discharged to rehab.  Patient was discharged home from rehab earlier this week and presented to the ER on 04/20/2020 with weakness and shortness of breath.  She was found to have HCAP.  CTs of chest, abdomen, and pelvis revealed progressive metastatic disease with significant tumor involvement of the liver and thyroid.  Palliative care was consulted help address goals.    SOCIAL HISTORY:     reports that she quit smoking about 3 years ago. Her smoking use included cigarettes. She smoked 1.00 pack per day. She has never used smokeless tobacco. She reports that she does not drink alcohol or use drugs.  Patient is unmarried.  She lives at home in an apartment with her twin sister as her primary caregiver.  Patient has a son and two grandchildren.  Patient is disabled from her muscular dystrophy but worked in a [employer] earlier in life.  ADVANCE DIRECTIVES:  On file.  Son is healthcare power of attorney.  CODE STATUS: DNR  PAST MEDICAL HISTORY: Past Medical History:    Diagnosis Date   Anxiety    Arthritis    Chronic kidney disease    Family history of breast cancer    aunt   GERD (gastroesophageal reflux disease)    History of DVT of lower extremity    Left leg   Lung cancer ([place])    with brain mets   Lung mass    Malignant hyperthermia    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[place])    mild form - per patient central cord disease   Muscular dystrophy ([place])    Muscular dystrophy ([place])     PAST SURGICAL HISTORY:  Past Surgical History:  Procedure Laterality Date   CESAREAN SECTION  11/1985   COLONOSCOPY N/A 10/27/2015   Procedure: COLONOSCOPY;  Surgeon: Manya Silvas, MD;  Location: [place];  Service: Endoscopy;  Laterality: N/A;   NO Propofol - per office   LITHOTRIPSY     PORTA CATH INSERTION N/A 11/09/2019   Procedure: PORTA CATH INSERTION;  Surgeon: Algernon Huxley, MD;  Location: [place] [facility];  Service: Cardiovascular;  Laterality: N/A;   PORTA CATH INSERTION N/A 01/23/2020   Procedure: PORTA CATH INSERTION;  Surgeon: Algernon Huxley, MD;  Location: [place] [facility];  Service: Cardiovascular;  Laterality: N/A;   PORTA CATH INSERTION N/A 02/01/2020   Procedure: PORTA CATH INSERTION;  Surgeon: Algernon Huxley, MD;  Location: [place] [facility];  Service: Cardiovascular;  Laterality: N/A;   TUBAL LIGATION  03/1986    HEMATOLOGY/ONCOLOGY HISTORY:  Oncology History  Malignant neoplasm of lower lobe of right lung ([place])  09/20/2019 Initial Diagnosis  Malignant neoplasm of lower lobe of right lung ([place])   10/05/2019 Cancer Staging   Staging form: Lung, AJCC 8th Edition - Clinical stage from 10/05/2019: Stage IV (cT2, cN2, cM1) - Signed by Sindy Guadeloupe, MD on 10/08/2019   11/12/2019 -  Chemotherapy   The patient had palonosetron (ALOXI) injection 0.25 mg, 0.25 mg, Intravenous,  Once, 4 of 4 cycles Administration: 0.25 mg (11/12/2019), 0.25 mg (12/24/2019), 0.25 mg (01/14/2020), 0.25 mg (12/03/2019) pegfilgrastim-cbqv  (UDENYCA) injection 6 mg, 6 mg, Subcutaneous, Once, 4 of 4 cycles Administration: 6 mg (11/13/2019), 6 mg (12/26/2019), 6 mg (01/15/2020), 6 mg (12/04/2019) CARBOplatin (PARAPLATIN) 500 mg in sodium chloride 0.9 % 250 mL chemo infusion, 500 mg (100 % of original dose 500 mg), Intravenous,  Once, 4 of 4 cycles Dose modification:   (original dose 487.5 mg, Cycle 1), 500 mg (original dose 500 mg, Cycle 1, Reason: Patient Age, Comment: per md) Administration: 500 mg (12/24/2019), 500 mg (01/14/2020), 500 mg (12/03/2019), 500 mg (11/12/2019) PACLitaxel (TAXOL) 324 mg in sodium chloride 0.9 % 500 mL chemo infusion (> 77m/m2), 200 mg/m2 = 324 mg, Intravenous,  Once, 4 of 4 cycles Dose modification: 175 mg/m2 (original dose 200 mg/m2, Cycle 3, Reason: Other (see comments), Comment: neuropathy) Administration: 324 mg (11/12/2019), 282 mg (12/24/2019), 282 mg (01/14/2020), 324 mg (12/03/2019) pembrolizumab (KEYTRUDA) 200 mg in sodium chloride 0.9 % 50 mL chemo infusion, 200 mg, Intravenous, Once, 6 of 10 cycles Administration: 200 mg (11/12/2019), 200 mg (12/24/2019), 200 mg (01/14/2020), 200 mg (02/04/2020), 200 mg (02/26/2020), 200 mg (03/18/2020) fosaprepitant (EMEND) 150 mg, dexamethasone (DECADRON) 12 mg in sodium chloride 0.9 % 145 mL IVPB, , Intravenous,  Once, 4 of 4 cycles Administration:  (11/12/2019),  (12/24/2019),  (01/14/2020),  (12/03/2019)  for chemotherapy treatment.      ALLERGIES:  is allergic to morphine and related; paxil [paroxetine hcl]; ciprofloxacin; levaquin [levofloxacin]; propofol; serzone [nefazodone]; biaxin [clarithromycin]; erythromycin; and penicillins.  MEDICATIONS:  Current Facility-Administered Medications  Medication Dose Route Frequency Provider Last Rate Last Admin   0.9 %  sodium chloride infusion   Intravenous Continuous NIvor Costa MD 125 mL/hr at 04/24/2020 1342 New Bag at 04/26/2020 1342   acetaminophen (TYLENOL) tablet 650 mg  650 mg Oral Q6H PRN NIvor Costa MD       albuterol  (VENTOLIN HFA) 108 (90 Base) MCG/ACT inhaler 2 puff  2 puff Inhalation Q4H PRN NIvor Costa MD       apixaban (Arne Cleveland tablet 5 mg  5 mg Oral BID NIvor Costa MD   5 mg at 04/10/2020 1417   ceFEPIme (MAXIPIME) 2 g in sodium chloride 0.9 % 100 mL IVPB  2 g Intravenous Q8H Wang, Hannah L, RPH       citalopram (CELEXA) tablet 40 mg  40 mg Oral Daily NIvor Costa MD   40 mg at 04/27/2020 1417   dextromethorphan-guaiFENesin (MUCINEX DM) 30-600 MG per 12 hr tablet 1 tablet  1 tablet Oral BID PRN NIvor Costa MD       docusate sodium (COLACE) capsule 200 mg  200 mg Oral BID NIvor Costa MD   200 mg at 04/08/2020 1417   HYDROmorphone (DILAUDID) injection 0.5 mg  0.5 mg Intravenous Q2H PRN Jenavieve Freda, JKirt Boys NP   0.5 mg at 04/23/2020 1644   ipratropium (ATROVENT) nebulizer solution 0.5 mg  2.5 mL Inhalation Q6H NIvor Costa MD       methimazole (TAPAZOLE) tablet 5 mg  5 mg Oral Daily NIvor Costa MD  OLANZapine (ZYPREXA) tablet 5 mg  5 mg Oral QHS Ivor Costa, MD       ondansetron [facility]) injection 4 mg  4 mg Intravenous Q8H PRN Ivor Costa, MD       oxyCODONE (Oxy IR/ROXICODONE) immediate release tablet 10 mg  10 mg Oral Q4H PRN Ivor Costa, MD       pantoprazole (PROTONIX) EC tablet 20 mg  20 mg Oral BID AC Ivor Costa, MD       predniSONE (DELTASONE) tablet 10 mg  10 mg Oral BID WC Ivor Costa, MD       vancomycin (VANCOREADY) IVPB 500 mg/100 mL  500 mg Intravenous Q12H Rocky Morel, [facility]       Current Outpatient Medications  Medication Sig Dispense Refill   citalopram (CELEXA) 40 MG tablet Take 1 tablet (40 mg total) by mouth daily. 30 tablet 3   docusate sodium (COLACE) 100 MG capsule Take 200 mg by mouth 2 (two) times daily.     ELIQUIS 5 MG TABS tablet TAKE 1 TABLET BY MOUTH TWICE A DAY (Patient taking differently: Take 5 mg by mouth 2 (two) times daily. ) 60 tablet 2   fentaNYL (DURAGESIC) 25 MCG/HR Place 1 patch onto the skin every 3 (three) days. 5 patch 0   methimazole (TAPAZOLE) 5 MG  tablet Take 5 mg by mouth daily.     OLANZapine (ZYPREXA) 10 MG tablet TAKE ONE TABLET BY MOUTH AT BEDTIME (Patient taking differently: Take 5 mg by mouth at bedtime. ) 30 tablet 1   ondansetron (ZOFRAN-ODT) 8 MG disintegrating tablet Take 8 mg by mouth every 6 (six) hours as needed.      Oxycodone HCl 10 MG TABS Take 1 tablet (10 mg total) by mouth every 4 (four) hours as needed. 12 tablet 0   pantoprazole (PROTONIX) 20 MG tablet TAKE 1 TABLET BY MOUTH 2 TIMES DAILY (Patient taking differently: Take 20 mg by mouth 2 (two) times daily before a meal. ) 120 tablet 0   polyethylene glycol (MIRALAX) 17 g packet Take 17 g by mouth 2 (two) times daily.     acetaminophen (TYLENOL) 325 MG tablet Take 650 mg by mouth every 6 (six) hours as needed.     dexamethasone (DECADRON) 4 MG tablet Take 1 tablet (4 mg total) by mouth every 12 (twelve) hours. (Patient not taking: Reported on 04/18/2020)     furosemide (LASIX) 20 MG tablet Take 20 mg by mouth as needed.     predniSONE (DELTASONE) 10 MG tablet Take 1 tablet (10 mg total) by mouth 2 (two) times daily with a meal. 14 tablet 0    VITAL SIGNS: BP (!) 100/57    Pulse 76    Temp 99.9 F (37.7 C) (Oral)    Resp 19    Ht '5\' 1"'  (1.549 m)    Wt 134 lb (60.8 kg)    LMP 12/15/2010 (Approximate)    SpO2 96%    BMI 25.32 kg/m  Filed Weights   04/15/2020 0910  Weight: 134 lb (60.8 kg)    Estimated body mass index is 25.32 kg/m as calculated from the following:   Height as of this encounter: '5\' 1"'  (1.549 m).   Weight as of this encounter: 134 lb (60.8 kg).  LABS: CBC:    Component Value Date/Time   WBC 21.3 (H) 04/19/2020 0924   HGB 11.4 (L) 04/07/2020 0924   HGB 13.5 11/07/2013 2128   HCT 35.7 (L) 04/16/2020 0924   HCT  39.2 11/07/2013 2128   PLT 279 04/05/2020 0924   PLT 282 11/07/2013 2128   MCV 95.2 04/06/2020 0924   MCV 96 11/07/2013 2128   NEUTROABS 19.5 (H) 04/26/2020 0924   NEUTROABS 6.8 (H) 05/24/2013 1340   LYMPHSABS 0.6 (L)  05/03/2020 0924   LYMPHSABS 1.6 05/24/2013 1340   MONOABS 0.9 04/19/2020 0924   MONOABS 0.6 05/24/2013 1340   EOSABS 0.1 05/03/2020 0924   EOSABS 0.0 05/24/2013 1340   BASOSABS 0.1 04/26/2020 0924   BASOSABS 0.0 05/24/2013 1340   Comprehensive Metabolic Panel:    Component Value Date/Time   NA 135 04/20/2020 0924   NA 140 11/07/2013 2128   K 5.0 04/23/2020 0924   K 4.2 11/07/2013 2128   CL 97 (L) 04/30/2020 0924   CL 107 11/07/2013 2128   CO2 28 05/02/2020 0924   CO2 29 11/07/2013 2128   BUN 25 (H) 04/28/2020 0924   BUN 21 (H) 11/07/2013 2128   CREATININE 0.37 (L) 05/03/2020 0924   CREATININE 0.50 (L) 11/07/2013 2128   GLUCOSE 112 (H) 05/02/2020 0924   GLUCOSE 107 (H) 11/07/2013 2128   CALCIUM 11.0 (H) 05/03/2020 0924   CALCIUM 9.3 11/07/2013 2128   AST 34 04/23/2020 0924   AST 48 (H) 11/07/2013 2128   ALT 35 04/12/2020 0924   ALT 60 11/07/2013 2128   ALKPHOS 151 (H) 04/16/2020 0924   ALKPHOS 92 11/07/2013 2128   BILITOT 0.9 04/09/2020 0924   BILITOT 0.2 11/07/2013 2128   PROT 7.7 04/18/2020 0924   PROT 7.5 11/07/2013 2128   ALBUMIN 2.7 (L) 05/03/2020 0924   ALBUMIN 3.6 11/07/2013 2128    RADIOGRAPHIC STUDIES: CT Head Wo Contrast  Result Date: 04/06/2020 CLINICAL DATA:  Weakness. EXAM: CT HEAD WITHOUT CONTRAST TECHNIQUE: Contiguous axial images were obtained from the base of the skull through the vertex without intravenous contrast. COMPARISON:  None. FINDINGS: Brain: There is mild cerebral atrophy with widening of the extra-axial spaces and ventricular dilatation. There are areas of decreased attenuation within the white matter tracts of the supratentorial brain, consistent with microvascular disease changes. Vascular: No hyperdense vessel or unexpected calcification. Skull: Normal. Negative for fracture or focal lesion. Sinuses/Orbits: No acute finding. Other: None. IMPRESSION: 1. No acute intracranial abnormality. 2. Mild cerebral atrophy and microvascular disease  changes of the supratentorial brain. Electronically Signed   By: Virgina Norfolk M.D.   On: 04/06/2020 01:12   CT CHEST W CONTRAST  Result Date: 04/15/2020 CLINICAL DATA:  Altered mental status and right upper quadrant abdominal pain. History of stage IV non-small cell lung cancer. EXAM: CT CHEST, ABDOMEN, AND PELVIS WITH CONTRAST TECHNIQUE: Multidetector CT imaging of the chest, abdomen and pelvis was performed following the standard protocol during bolus administration of intravenous contrast. CONTRAST:  160m OMNIPAQUE IOHEXOL 300 MG/ML  SOLN COMPARISON:  01/28/2020 FINDINGS: CT CHEST FINDINGS Cardiovascular: The heart is normal in size. No pericardial effusion. The aorta is normal in caliber. No dissection. Stable minimal calcifications. The branch vessels are patent. No definite coronary artery calcifications. Mediastinum/Nodes: Slightly progressive soft tissue density noted in the subcarinal region with maximum measurement of 9 mm. This previously measured 6.5 mm. I do not see any other enlarged mediastinal or hilar lymph nodes. The esophagus is grossly normal. Lungs/Pleura: The right lower lobe lesion is grossly stable. It is a little hard to measure because of new eventration/elevation of the right hemidiaphragm. Right lower lobe and right middle lobe peribronchial thickening and patchy airspace opacity the suggesting bronchopneumonia.  A few scattered tiny pulmonary nodules are noted some of which are calcified granulomas. New 5 mm peripheral nodule in the right upper lobe on image number 78/4 could be inflammatory but metastatic disease is not excluded. Subpleural density in the left lower lobe on image 77/4 measures 9 mm and is likely inflammatory or focus of atelectasis. Musculoskeletal: Interval marked enlargement of the thyroid gland multiple large nodules. I am not sure hydro explain this rapid progression. Could not exclude the possibility of metastatic disease although that would be pretty  unusual. No axillary or supraclavicular adenopathy. No breast masses. The bony structures are intact. No worrisome bone lesions. CT ABDOMEN PELVIS FINDINGS Hepatobiliary: Interval development of extensive hepatic metastatic disease. I do not see any lesions even in retrospect on the prior study from February 22nd. This would suggest very aggressive metastatic disease. 4.6 cm lesion in segment 4A. Coalescent lesions in the right hepatic lobe in and around the caudate lobe measures a maximum of 7.7 cm. 2.7 cm lesion in segment 6 on image 53/2. 2.5 cm segment 6 lesion on image 59/2. The gallbladder is contracted. No intra or extrahepatic biliary dilatation. Pancreas: No mass, inflammation or ductal dilatation. Spleen: Normal size. No focal lesions. Adrenals/Urinary Tract: Bilateral adrenal gland metastasis appear relatively stable. The the kidneys are unremarkable and stable. Stable large right renal cyst. The bladder appears normal. Stomach/Bowel: The stomach, duodenum, small bowel and colon are grossly normal. Large amount of stool throughout the colon and down into the rectum suggesting constipation. Vascular/Lymphatic: Stable age advanced atherosclerotic calcifications involving the distal aorta and iliac arteries but no aneurysm or dissection. The branch vessels are patent. The major venous structures are patent. New right-sided retroperitoneal lymph nodes with a intra aortocaval lymph node measuring 9.5 mm on image 62/2. Reproductive: The uterus and ovaries are normal. Other: No pelvic mass or pelvic adenopathy. No free pelvic fluid collections. No inguinal mass or adenopathy. Musculoskeletal: No significant bony findings. IMPRESSION: 1. Stable right lower lobe lung lesion. 2. Right lower lobe and right middle lobe bronchopneumonia. 3. New 5 mm peripheral nodule in the right upper lobe could be inflammatory but metastatic disease is not excluded. Recommend attention on follow-up. 4. Interval development of  extensive hepatic metastatic disease. 5. Stable bilateral adrenal gland metastasis. 6. New right-sided retroperitoneal lymph nodes. 7. Interval marked enlargement of the thyroid gland with multiple large nodules. Could not exclude the possibility of metastatic disease although that would be pretty unusual. 8. PET-CT may be helpful for further evaluation, if necessary. 9. Large amount of stool throughout the colon and down into the rectum suggesting constipation. Aortic Atherosclerosis (ICD10-I70.0). Electronically Signed   By: Marijo Sanes M.D.   On: 04/28/2020 11:25   CT ABDOMEN PELVIS W CONTRAST  Result Date: 04/18/2020 CLINICAL DATA:  Altered mental status and right upper quadrant abdominal pain. History of stage IV non-small cell lung cancer. EXAM: CT CHEST, ABDOMEN, AND PELVIS WITH CONTRAST TECHNIQUE: Multidetector CT imaging of the chest, abdomen and pelvis was performed following the standard protocol during bolus administration of intravenous contrast. CONTRAST:  127m OMNIPAQUE IOHEXOL 300 MG/ML  SOLN COMPARISON:  01/28/2020 FINDINGS: CT CHEST FINDINGS Cardiovascular: The heart is normal in size. No pericardial effusion. The aorta is normal in caliber. No dissection. Stable minimal calcifications. The branch vessels are patent. No definite coronary artery calcifications. Mediastinum/Nodes: Slightly progressive soft tissue density noted in the subcarinal region with maximum measurement of 9 mm. This previously measured 6.5 mm. I do not see any  other enlarged mediastinal or hilar lymph nodes. The esophagus is grossly normal. Lungs/Pleura: The right lower lobe lesion is grossly stable. It is a little hard to measure because of new eventration/elevation of the right hemidiaphragm. Right lower lobe and right middle lobe peribronchial thickening and patchy airspace opacity the suggesting bronchopneumonia. A few scattered tiny pulmonary nodules are noted some of which are calcified granulomas. New 5 mm  peripheral nodule in the right upper lobe on image number 78/4 could be inflammatory but metastatic disease is not excluded. Subpleural density in the left lower lobe on image 77/4 measures 9 mm and is likely inflammatory or focus of atelectasis. Musculoskeletal: Interval marked enlargement of the thyroid gland multiple large nodules. I am not sure hydro explain this rapid progression. Could not exclude the possibility of metastatic disease although that would be pretty unusual. No axillary or supraclavicular adenopathy. No breast masses. The bony structures are intact. No worrisome bone lesions. CT ABDOMEN PELVIS FINDINGS Hepatobiliary: Interval development of extensive hepatic metastatic disease. I do not see any lesions even in retrospect on the prior study from February 22nd. This would suggest very aggressive metastatic disease. 4.6 cm lesion in segment 4A. Coalescent lesions in the right hepatic lobe in and around the caudate lobe measures a maximum of 7.7 cm. 2.7 cm lesion in segment 6 on image 53/2. 2.5 cm segment 6 lesion on image 59/2. The gallbladder is contracted. No intra or extrahepatic biliary dilatation. Pancreas: No mass, inflammation or ductal dilatation. Spleen: Normal size. No focal lesions. Adrenals/Urinary Tract: Bilateral adrenal gland metastasis appear relatively stable. The the kidneys are unremarkable and stable. Stable large right renal cyst. The bladder appears normal. Stomach/Bowel: The stomach, duodenum, small bowel and colon are grossly normal. Large amount of stool throughout the colon and down into the rectum suggesting constipation. Vascular/Lymphatic: Stable age advanced atherosclerotic calcifications involving the distal aorta and iliac arteries but no aneurysm or dissection. The branch vessels are patent. The major venous structures are patent. New right-sided retroperitoneal lymph nodes with a intra aortocaval lymph node measuring 9.5 mm on image 62/2. Reproductive: The uterus  and ovaries are normal. Other: No pelvic mass or pelvic adenopathy. No free pelvic fluid collections. No inguinal mass or adenopathy. Musculoskeletal: No significant bony findings. IMPRESSION: 1. Stable right lower lobe lung lesion. 2. Right lower lobe and right middle lobe bronchopneumonia. 3. New 5 mm peripheral nodule in the right upper lobe could be inflammatory but metastatic disease is not excluded. Recommend attention on follow-up. 4. Interval development of extensive hepatic metastatic disease. 5. Stable bilateral adrenal gland metastasis. 6. New right-sided retroperitoneal lymph nodes. 7. Interval marked enlargement of the thyroid gland with multiple large nodules. Could not exclude the possibility of metastatic disease although that would be pretty unusual. 8. PET-CT may be helpful for further evaluation, if necessary. 9. Large amount of stool throughout the colon and down into the rectum suggesting constipation. Aortic Atherosclerosis (ICD10-I70.0). Electronically Signed   By: Marijo Sanes M.D.   On: 04/05/2020 11:25   DG Chest Portable 1 View  Result Date: 04/05/2020 CLINICAL DATA:  Weakness. History of lung cancer. Progressive weakness. EXAM: PORTABLE CHEST 1 VIEW COMPARISON:  Radiograph 12/27/2019. CT 01/28/2020 FINDINGS: Tip of the right chest port in the SVC. Mild elevation of right hemidiaphragm with streaky opacity at the right lung base, corresponding to nodule on CT. Minor streaky left lung base atelectasis. The heart is normal in size. Normal mediastinal contours. No pleural fluid or pneumothorax. No acute osseous  abnormalities are seen. IMPRESSION: 1. Mild elevation of the right hemidiaphragm with streaky opacity at the right lung base, corresponding to nodule on CT. 2. Minor streaky left lung base atelectasis. Electronically Signed   By: Keith Rake M.D.   On: 04/05/2020 23:55   US Abdomen Limited RUQ  Result Date: 04/06/2020 CLINICAL DATA:  Right upper quadrant pain and nausea.  EXAM: ULTRASOUND ABDOMEN LIMITED RIGHT UPPER QUADRANT COMPARISON:  CT 01/28/2020 FINDINGS: Gallbladder: No gallstones or wall thickening visualized. No sonographic Murphy sign noted by sonographer. Common bile duct: Diameter: 8 mm unchanged. Liver: Mild increased parenchymal echogenicity centrally suggesting steatosis. No focal mass. Portal vein is patent on color Doppler imaging with normal direction of blood flow towards the liver. Other: Incidental right renal cyst unchanged. IMPRESSION: No acute findings. Electronically Signed   By: Marin Olp M.D.   On: 04/06/2020 16:24    PERFORMANCE STATUS (ECOG) : 4 - Bedbound  Review of Systems Unless otherwise noted, a complete review of systems is negative.  Physical Exam General: Frail appearing Pulmonary: Unlabored Extremities: no edema, no joint deformities Skin: no rashes Neurological: Weakness but otherwise nonfocal  IMPRESSION: Work-up reveals progressive metastatic disease.  Dr. Janese Banks met with patient and family, who have decided to forego further work-up and treatment and instead pursue hospice.  Family do not feel that patient can safely return home.  I spoke with the hospice liaison who will coordinate admission to hospice [facility].  Unfortunately, there will not be a bed available at the [facility] until early next week.  Family are in agreement with continued treatment of her pneumonia until a time that a hospice bed is available.  Emotional support provided to patient's son and sister.  Symptomatically, patient complains of significant R. Sided flank. Suspect there could be a component of capsular pain. Continue prednisone BID. TD fentanyl was increased earlier this week. Will resume at home dose. Will start IV hydromorphone PRN for BTP. Will start bowel regimen due to evidence of constipation on CT.   *Patient is at end of life with probable life expectancy of days-weeks. D[facility] policy with [facility]. Please liberalize the  visitation policy to accommodate patient/family.  PLAN: -Best supportive care -Hospice Home when a bed is available -TOC consult to help coordinate discharge planning -Continue prednisone BID -Resume TD fentanyl -Start hydromorphone IV PRN for BTP -Start daily senna with PRN Miralax -Chaplain consult -Please liberalize visitation policy  Case and plan discussed with Drs. Janese Banks and Blaine Hamper  Time Total: 30 minutes  Visit consisted of counseling and education dealing with the complex and emotionally intense issues of symptom management and palliative care in the setting of serious and potentially life-threatening illness.Greater than 50%  of this time was spent counseling and coordinating care related to the above assessment and plan.  Signed by: Altha Harm, PhD, NP-C

## 2020-04-25 NOTE — Telephone Encounter (Signed)
Called patient's sister [name], to schedule a Palliative f/u visit in the home, no answer - left message with reason for call along with my contact information.  I noticed that the patient is currently at ED at [facility], I have notified the [facility] Liaison Team for follow-up.

## 2020-04-25 NOTE — ED Notes (Signed)
Admitting MD at bedside.

## 2020-04-25 NOTE — Progress Notes (Signed)
CODE SEPSIS - PHARMACY COMMUNICATION  **Broad Spectrum Antibiotics should be administered within 1 hour of Sepsis diagnosis**  Time Code Sepsis Called/Page Received: 7127  Antibiotics Ordered: vancomycin/cefepime/metronidazole  Time of 1st antibiotic administration: 1014  Additional action taken by pharmacy: NA  If necessary, Name of Provider/Nurse Contacted: NA    Tawnya Crook ,PharmD Clinical Pharmacist  04/10/2020  11:28 AM

## 2020-04-25 NOTE — ED Notes (Signed)
Pain meds given

## 2020-04-25 NOTE — ED Notes (Signed)
Assigned bed @ 1735, spoke with RN Amy

## 2020-04-25 NOTE — ED Triage Notes (Signed)
Pt arrived via [facility] from with reports of altered mental status and c/o RUQ pain. Pt released from [facility] on Wednesday.  Pt has hx of CA,   Pt had oxygen sat initially at 85%, placed onNRB 95-96%.

## 2020-04-25 NOTE — ED Notes (Signed)
Dr. Corky Downs notified patient is possible sepsis patient.

## 2020-04-25 NOTE — ED Notes (Signed)
Report called to Zara Chess floor nurse

## 2020-04-25 NOTE — TOC Initial Note (Addendum)
Transition of Care [facility]) - Initial/Assessment Note    Patient Details  Name: Deborah Nguyen MRN: [id number] Date of Birth: 1961/11/14  Transition of Care [facility]) CM/SW Contact:    Anselm Pancoast, RN Phone Number: 04/10/2020, 4:27 PM  Clinical Narrative:                 RN CM spoke with Olivia Mackie @ [facility] updating that family/patient had discussed care with MD regarding Hospice services. Family is interested in [facility] however no bed will be available until Monday of next week. Left VM for son requesting callback to review Hospice choice. Spoke with sister who was en route to hospital and  waiting for patient to move to room upstairs. Sister was very emotional and call was disconnected.        Patient Goals and CMS Choice        Expected Discharge Plan and Services                                                Prior Living Arrangements/Services                       Activities of Daily Living      Permission Sought/Granted                  Emotional Assessment              Admission diagnosis:  HCAP (healthcare-associated pneumonia) [J18.9] Patient Active Problem List   Diagnosis Date Noted  . HCAP (healthcare-associated pneumonia) 04/13/2020  . Sepsis ([place]) 04/10/2020  . Right flank pain 04/26/2020  . Hypercalcemia 04/11/2020  . Generalized weakness 04/06/2020  . Lung cancer metastatic to brain ([place]) 04/06/2020  . Fever 04/06/2020  . RUQ pain 04/06/2020  . Acute metabolic encephalopathy 75/64/3329  . 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 Neoplastic malignant related fatigue   . Brain metastases ([place])   . Port-A-Cath in place 01/15/2020  . 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 Thyroid nodule 10/24/2019  . Goals of care, counseling/discussion 09/20/2019  . 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 Malignant neoplasm of lower lobe of right lung ([place]) 09/20/2019  . GAD  (generalized anxiety disorder) 09/01/2018  . History of kidney stones 09/01/2018  . Subclinical hyperthyroidism 01/24/2017  . Central core myopathy 09/17/2016  . Low serum vitamin D 09/17/2016  . Hx of adenomatous colonic polyps 08/01/2015  . History of DVT of lower extremity 05/23/2015  . Depression with anxiety 05/31/2014  . GERD (gastroesophageal reflux disease) 05/31/2014  . Febrile illness 05/06/2013  . Multinodular goiter (nontoxic) 07/05/2011   PCP:  Tracie Harrier, MD Pharmacy:   [place], [place] - [address] [address] Phone: [phone] Fax: [phone]     Social Determinants of Health (SDOH) Interventions    Readmission Risk Interventions Readmission Risk Prevention Plan 04/07/2020  Transportation Screening Complete  PCP or Specialist Appt within 3-5 Days Complete  HRI or Home Care Consult Complete  Medication Review (RN Care Manager) Complete  Some recent data might be hidden

## 2020-04-25 NOTE — H&P (Signed)
History and Physical    Deborah Nguyen:[id number] DOB: [birth year] DOA: 04/09/2020  Referring MD/NP/PA:   PCP: Tracie Harrier, MD   Patient coming from:  The patient is coming from home.  At baseline, pt is independent for most of ADL.        Chief Complaint: Generalized weakness, shortness of breath, cough, right upper quadrant abdominal pain  HPI: Deborah Nguyen is a 59 y.o. 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  Patient states that she has been having cough, shortness of breath in the past several days.  Denies chest pain, fever or chills.  She has had right upper quadrant abdominal pain, which is constant, moderate, nonradiating.  She has nausea, no vomiting, diarrhea.  No symptoms of UTI or unilateral weakness.  ED Course: pt was found to have WBC 21.3, INR 1.5, lactic acid of 1.3, negative COVID-19 PCR, negative urinalysis, electrolytes renal function okay, temperature 99.9, soft blood pressure, tachycardia, tachypnea, oxygen saturation 85% on room air, initially needed nonrebreather, later on improved to 97% on room air.  Patient is admitted to [facility] bed as inpatient.  Oncologist, Dr. Janese Banks is consulted by EDP.  C# T-chest and abd/pelvis showed: 1. Stable right lower lobe lung lesion. 2. Right lower lobe and right middle lobe bronchopneumonia. 3. New 5 mm peripheral nodule in the right upper lobe could be inflammatory but metastatic disease is not excluded. Recommend attention on follow-up. 4. Interval development of extensive hepatic metastatic disease. 5. Stable bilateral adrenal gland metastasis. 6. New right-sided retroperitoneal lymph nodes. 7. Interval marked enlargement of the thyroid gland with multiple large nodules.  Could not exclude the possibility of metastatic disease although that would be pretty unusual. 8. PET-CT may be helpful for further evaluation, if necessary. 9. Large amount of stool throughout the colon and down into the rectum suggesting constipation.   Review of Systems:   General: no fevers, chills, no body weight gain, has poor appetite, has fatigue HEENT: no blurry vision, hearing changes or sore throat Respiratory: has dyspnea, coughing, no wheezing CV: no chest pain, no palpitations GI: has nausea, abdominal pain, no diarrhea, constipation, vomiting,  GU: no dysuria, burning on urination, increased urinary frequency, hematuria  Ext: has trace leg edema Neuro: no unilateral weakness, numbness, or tingling, no vision change or hearing loss Skin: no rash, no skin tear. MSK: No muscle spasm, no deformity, no limitation of range of movement in spin Heme: No easy bruising.  Travel history: No recent long distant travel.  Allergy:  Allergies  Allergen Reactions  . Morphine And Related Anaphylaxis  . Paxil [Paroxetine Hcl] Other (See Comments)    Hallucinations   . Ciprofloxacin Other (See Comments)    Unknown  . Levaquin [Levofloxacin] Other (See Comments)    Unknown   . 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 She has an increase risk of malignant hyperthermia with anesthesia. She should not receive Propofol.   Donne Hazel [Nefazodone] Other (See Comments)    Unknown   . Biaxin [Clarithromycin] Nausea Only  . Erythromycin Nausea Only  . Penicillins Rash    Past Medical History:  Diagnosis Date  . Anxiety   . Arthritis   . Chronic kidney disease   . Family history  of breast cancer    aunt  . GERD (gastroesophageal reflux disease)   . History of DVT of lower extremity    Left leg  . Lung cancer ([place])    with brain mets  . Lung mass   . Malignant hyperthermia   . 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 Muscular dystrophy ([place])   . Muscular dystrophy [facility])     Past Surgical History:  Procedure Laterality Date  . CESAREAN SECTION  11/1985  . COLONOSCOPY N/A 10/27/2015   Procedure: COLONOSCOPY;  Surgeon: Manya Silvas, MD;  Location: [facility] ENDOSCOPY;  Service: Endoscopy;  Laterality: N/A;   NO Propofol - per office  . LITHOTRIPSY    . PORTA CATH INSERTION N/A 11/09/2019   Procedure: PORTA CATH INSERTION;  Surgeon: Algernon Huxley, MD;  Location: [facility] CV LAB;  Service: Cardiovascular;  Laterality: N/A;  . PORTA CATH INSERTION N/A 01/23/2020   Procedure: PORTA CATH INSERTION;  Surgeon: Algernon Huxley, MD;  Location: [facility] LAB;  Service: Cardiovascular;  Laterality: N/A;  . PORTA CATH INSERTION N/A 02/01/2020   Procedure: PORTA CATH INSERTION;  Surgeon: Algernon Huxley, MD;  Location: [facility] CV LAB;  Service: Cardiovascular;  Laterality: N/A;  . TUBAL LIGATION  03/1986    Social History:  reports that she quit smoking about 3 years ago. Her smoking use included cigarettes. She smoked 1.00 pack per day. She has never used smokeless tobacco. She reports that she does not drink alcohol or use drugs.  Family History:  Family History  Problem Relation Age of Onset  . Cancer Father        bladder, lung  . Breast cancer Maternal Aunt 45     Prior to Admission medications   Medication Sig Start Date End Date Taking? Authorizing Provider  citalopram (CELEXA) 40 MG tablet Take 1 tablet (40 mg total) by mouth daily. 12/11/19  Yes Borders, Kirt Boys, NP  docusate sodium (COLACE) 100 MG capsule Take 200 mg by mouth 2 (two) times daily.   Yes [provider]  ELIQUIS 5 MG TABS tablet TAKE 1 TABLET BY MOUTH TWICE A DAY Patient taking differently: Take 5 mg by mouth 2 (two) times daily.  03/17/20  Yes Sindy Guadeloupe, MD  fentaNYL (DURAGESIC) 25 MCG/HR Place 1 patch onto the skin every 3 (three) days. 04/21/20  Yes Borders, Kirt Boys, NP    methimazole (TAPAZOLE) 5 MG tablet Take 5 mg by mouth daily.   Yes [provider]  OLANZapine (ZYPREXA) 10 MG tablet TAKE ONE TABLET BY MOUTH AT BEDTIME Patient taking differently: Take 5 mg by mouth at bedtime.  02/15/20  Yes Burns, Wandra Feinstein, NP  ondansetron (ZOFRAN-ODT) 8 MG disintegrating tablet Take 8 mg by mouth every 6 (six) hours as needed.  01/16/20  Yes [provider]  Oxycodone HCl 10 MG TABS Take 1 tablet (10 mg total) by mouth every 4 (four) hours as needed. 04/10/20  Yes Fritzi Mandes, MD  pantoprazole (PROTONIX) 20 MG tablet TAKE 1 TABLET BY MOUTH 2 TIMES DAILY Patient taking differently: Take 20 mg by mouth 2 (two) times daily before a meal.  02/27/20  Yes Sindy Guadeloupe, MD  polyethylene glycol (MIRALAX) 17 g packet Take 17 g by mouth 2 (two) times daily.   Yes [provider]  acetaminophen (TYLENOL) 325 MG tablet Take 650 mg by mouth every 6 (six) hours  as needed.    [provider]  dexamethasone (DECADRON) 4 MG tablet Take 1 tablet (4 mg total) by mouth every 12 (twelve) hours. Patient not taking: Reported on 04/18/2020 04/08/20   Samuella Cota, MD  furosemide (LASIX) 20 MG tablet Take 20 mg by mouth as needed.    [provider]  predniSONE (DELTASONE) 10 MG tablet Take 1 tablet (10 mg total) by mouth 2 (two) times daily with a meal. 04/24/20   Sindy Guadeloupe, MD  pantoprazole (PROTONIX) 20 MG tablet TAKE 1 TABLET BY MOUTH 2 TIMES DAILY 12/18/19   Sindy Guadeloupe, MD    Physical Exam: Vitals:   04/24/2020 1330 04/21/2020 1342 05/05/2020 1400 04/26/2020 1703  BP: 98/62 106/64 (!) 100/57 93/61  Pulse: 88 92 76 90  Resp: (!) 21 18 19 18   Temp:    98.2 F (36.8 C)  TempSrc:    Oral  SpO2: 97% 99% 96% 98%  Weight:      Height:       General: Not in acute distress HEENT:       Eyes: PERRL, EOMI, no scleral icterus.       ENT: No discharge from the ears and nose, no pharynx injection, no tonsillar enlargement.        Neck: No JVD, no  bruit, no mass felt. Heme: No neck lymph node enlargement. Cardiac: S1/S2, RRR, No murmurs, No gallops or rubs. Respiratory:  No rales, wheezing, rhonchi or rubs. GI: Soft, nondistended, has tenderness in RUQ, no rebound pain, no organomegaly, BS present. GU: No hematuria Ext: has trace leg edema bilaterally. 2+DP/PT pulse bilaterally. Musculoskeletal: No joint deformities, No joint redness or warmth, no limitation of ROM in spin. Skin: No rashes.  Neuro: Alert, oriented X3, cranial nerves II-XII grossly intact, moves all extremities. Psych: Patient is not psychotic, no suicidal or hemocidal ideation.  Labs on Admission: I have personally reviewed following labs and imaging studies  CBC: Recent Labs  Lab 04/11/2020 0924  WBC 21.3*  NEUTROABS 19.5*  HGB 11.4*  HCT 35.7*  MCV 95.2  PLT 622   Basic Metabolic Panel: Recent Labs  Lab 04/05/2020 0924  NA 135  K 5.0  CL 97*  CO2 28  GLUCOSE 112*  BUN 25*  CREATININE 0.37*  CALCIUM 11.0*   GFR: Estimated Creatinine Clearance: 63.4 mL/min (A) (by C-G formula based on SCr of 0.37 mg/dL (L)). Liver Function Tests: Recent Labs  Lab 04/08/2020 0924  AST 34  ALT 35  ALKPHOS 151*  BILITOT 0.9  PROT 7.7  ALBUMIN 2.7*   No results for input(s): LIPASE, AMYLASE in the last 168 hours. No results for input(s): AMMONIA in the last 168 hours. Coagulation Profile: Recent Labs  Lab 05/05/2020 0924  INR 1.5*   Cardiac Enzymes: No results for input(s): CKTOTAL, CKMB, CKMBINDEX, TROPONINI in the last 168 hours. BNP (last 3 results) No results for input(s): PROBNP in the last 8760 hours. HbA1C: No results for input(s): HGBA1C in the last 72 hours. CBG: No results for input(s): GLUCAP in the last 168 hours. Lipid Profile: No results for input(s): CHOL, HDL, LDLCALC, TRIG, CHOLHDL, LDLDIRECT in the last 72 hours. Thyroid Function Tests: No results for input(s): TSH, T4TOTAL, FREET4, T3FREE, THYROIDAB in the last 72 hours. Anemia  Panel: No results for input(s): VITAMINB12, FOLATE, FERRITIN, TIBC, IRON, RETICCTPCT in the last 72 hours. Urine analysis:    Component Value Date/Time   COLORURINE YELLOW (A) 04/07/2020 0924   APPEARANCEUR HAZY (  A) 04/28/2020 0924   APPEARANCEUR Clear 11/07/2013 2128   LABSPEC 1.021 04/23/2020 0924   LABSPEC 1.023 11/07/2013 2128   PHURINE 5.0 04/26/2020 0924   GLUCOSEU NEGATIVE 05/02/2020 0924   GLUCOSEU Negative 11/07/2013 2128   HGBUR NEGATIVE 04/22/2020 [place] 04/19/2020 0924   BILIRUBINUR Negative 11/07/2013 2128   KETONESUR NEGATIVE 04/30/2020 0924   PROTEINUR NEGATIVE 04/15/2020 0924   NITRITE NEGATIVE 04/24/2020 0924   LEUKOCYTESUR NEGATIVE 04/05/2020 0924   LEUKOCYTESUR Negative 11/07/2013 2128   Sepsis Labs: @LABRCNTIP (procalcitonin:4,lacticidven:4) ) Recent Results (from the past 240 hour(s))  SARS Coronavirus 2 by RT PCR (hospital order, performed in [facility] hospital lab) Nasopharyngeal Nasopharyngeal Swab     Status: None   Collection Time: 04/20/2020 12:09 PM   Specimen: Nasopharyngeal Swab  Result Value Ref Range Status   SARS Coronavirus 2 NEGATIVE NEGATIVE Final    Comment: (NOTE) SARS-CoV-2 target nucleic acids are NOT DETECTED. The SARS-CoV-2 RNA is generally detectable in upper and lower respiratory specimens during the acute phase of infection. The lowest concentration of SARS-CoV-2 viral copies this assay can detect is 250 copies / mL. A negative result does not preclude SARS-CoV-2 infection and should not be used as the sole basis for treatment or other patient management decisions.  A negative result may occur with improper specimen collection / handling, submission of specimen other than nasopharyngeal swab, presence of viral mutation(s) within the areas targeted by this assay, and inadequate number of viral copies (<250 copies / mL). A negative result must be combined with clinical observations, patient history, and  epidemiological information. Fact Sheet for Patients:   StrictlyIdeas.no Fact Sheet for Healthcare Providers: BankingDealers.co.za This test is not yet approved or cleared  by the [place] FDA and has been authorized for detection and/or diagnosis of SARS-CoV-2 by FDA under an Emergency Use Authorization (EUA).  This EUA will remain in effect (meaning this test can be used) for the duration of the COVID-19 declaration under Section 564(b)(1) of the Act, 21 U.S.C. section 360bbb-3(b)(1), unless the authorization is terminated or revoked sooner. Performed at [facility], [address] [place], [place] [address]      Radiological Exams on Admission: CT CHEST W CONTRAST  Result Date: 05/02/2020 CLINICAL DATA:  Altered mental status and right upper quadrant abdominal pain. History of stage IV non-small cell lung cancer. EXAM: CT CHEST, ABDOMEN, AND PELVIS WITH CONTRAST TECHNIQUE: Multidetector CT imaging of the chest, abdomen and pelvis was performed following the standard protocol during bolus administration of intravenous contrast. CONTRAST:  163mL OMNIPAQUE IOHEXOL 300 MG/ML  SOLN COMPARISON:  01/28/2020 FINDINGS: CT CHEST FINDINGS Cardiovascular: The heart is normal in size. No pericardial effusion. The aorta is normal in caliber. No dissection. Stable minimal calcifications. The branch vessels are patent. No definite coronary artery calcifications. Mediastinum/Nodes: Slightly progressive soft tissue density noted in the subcarinal region with maximum measurement of 9 mm. This previously measured 6.5 mm. I do not see any other enlarged mediastinal or hilar lymph nodes. The esophagus is grossly normal. Lungs/Pleura: The right lower lobe lesion is grossly stable. It is a little hard to measure because of new eventration/elevation of the right hemidiaphragm. Right lower lobe and right middle lobe peribronchial thickening and patchy  airspace opacity the suggesting bronchopneumonia. A few scattered tiny pulmonary nodules are noted some of which are calcified granulomas. New 5 mm peripheral nodule in the right upper lobe on image number 78/4 could be inflammatory but metastatic disease is not  excluded. Subpleural density in the left lower lobe on image 77/4 measures 9 mm and is likely inflammatory or focus of atelectasis. Musculoskeletal: Interval marked enlargement of the thyroid gland multiple large nodules. I am not sure hydro explain this rapid progression. Could not exclude the possibility of metastatic disease although that would be pretty unusual. No axillary or supraclavicular adenopathy. No breast masses. The bony structures are intact. No worrisome bone lesions. CT ABDOMEN PELVIS FINDINGS Hepatobiliary: Interval development of extensive hepatic metastatic disease. I do not see any lesions even in retrospect on the prior study from February 22nd. This would suggest very aggressive metastatic disease. 4.6 cm lesion in segment 4A. Coalescent lesions in the right hepatic lobe in and around the caudate lobe measures a maximum of 7.7 cm. 2.7 cm lesion in segment 6 on image 53/2. 2.5 cm segment 6 lesion on image 59/2. The gallbladder is contracted. No intra or extrahepatic biliary dilatation. Pancreas: No mass, inflammation or ductal dilatation. Spleen: Normal size. No focal lesions. Adrenals/Urinary Tract: Bilateral adrenal gland metastasis appear relatively stable. The the kidneys are unremarkable and stable. Stable large right renal cyst. The bladder appears normal. Stomach/Bowel: The stomach, duodenum, small bowel and colon are grossly normal. Large amount of stool throughout the colon and down into the rectum suggesting constipation. Vascular/Lymphatic: Stable age advanced atherosclerotic calcifications involving the distal aorta and iliac arteries but no aneurysm or dissection. The branch vessels are patent. The major venous structures  are patent. New right-sided retroperitoneal lymph nodes with a intra aortocaval lymph node measuring 9.5 mm on image 62/2. Reproductive: The uterus and ovaries are normal. Other: No pelvic mass or pelvic adenopathy. No free pelvic fluid collections. No inguinal mass or adenopathy. Musculoskeletal: No significant bony findings. IMPRESSION: 1. Stable right lower lobe lung lesion. 2. Right lower lobe and right middle lobe bronchopneumonia. 3. New 5 mm peripheral nodule in the right upper lobe could be inflammatory but metastatic disease is not excluded. Recommend attention on follow-up. 4. Interval development of extensive hepatic metastatic disease. 5. Stable bilateral adrenal gland metastasis. 6. New right-sided retroperitoneal lymph nodes. 7. Interval marked enlargement of the thyroid gland with multiple large nodules. Could not exclude the possibility of metastatic disease although that would be pretty unusual. 8. PET-CT may be helpful for further evaluation, if necessary. 9. Large amount of stool throughout the colon and down into the rectum suggesting constipation. Aortic Atherosclerosis (ICD10-I70.0). Electronically Signed   By: Marijo Sanes M.D.   On: 04/13/2020 11:25   CT ABDOMEN PELVIS W CONTRAST  Result Date: 04/09/2020 CLINICAL DATA:  Altered mental status and right upper quadrant abdominal pain. History of stage IV non-small cell lung cancer. EXAM: CT CHEST, ABDOMEN, AND PELVIS WITH CONTRAST TECHNIQUE: Multidetector CT imaging of the chest, abdomen and pelvis was performed following the standard protocol during bolus administration of intravenous contrast. CONTRAST:  182mL OMNIPAQUE IOHEXOL 300 MG/ML  SOLN COMPARISON:  01/28/2020 FINDINGS: CT CHEST FINDINGS Cardiovascular: The heart is normal in size. No pericardial effusion. The aorta is normal in caliber. No dissection. Stable minimal calcifications. The branch vessels are patent. No definite coronary artery calcifications. Mediastinum/Nodes:  Slightly progressive soft tissue density noted in the subcarinal region with maximum measurement of 9 mm. This previously measured 6.5 mm. I do not see any other enlarged mediastinal or hilar lymph nodes. The esophagus is grossly normal. Lungs/Pleura: The right lower lobe lesion is grossly stable. It is a little hard to measure because of new eventration/elevation of the right hemidiaphragm.  Right lower lobe and right middle lobe peribronchial thickening and patchy airspace opacity the suggesting bronchopneumonia. A few scattered tiny pulmonary nodules are noted some of which are calcified granulomas. New 5 mm peripheral nodule in the right upper lobe on image number 78/4 could be inflammatory but metastatic disease is not excluded. Subpleural density in the left lower lobe on image 77/4 measures 9 mm and is likely inflammatory or focus of atelectasis. Musculoskeletal: Interval marked enlargement of the thyroid gland multiple large nodules. I am not sure hydro explain this rapid progression. Could not exclude the possibility of metastatic disease although that would be pretty unusual. No axillary or supraclavicular adenopathy. No breast masses. The bony structures are intact. No worrisome bone lesions. CT ABDOMEN PELVIS FINDINGS Hepatobiliary: Interval development of extensive hepatic metastatic disease. I do not see any lesions even in retrospect on the prior study from February 22nd. This would suggest very aggressive metastatic disease. 4.6 cm lesion in segment 4A. Coalescent lesions in the right hepatic lobe in and around the caudate lobe measures a maximum of 7.7 cm. 2.7 cm lesion in segment 6 on image 53/2. 2.5 cm segment 6 lesion on image 59/2. The gallbladder is contracted. No intra or extrahepatic biliary dilatation. Pancreas: No mass, inflammation or ductal dilatation. Spleen: Normal size. No focal lesions. Adrenals/Urinary Tract: Bilateral adrenal gland metastasis appear relatively stable. The the kidneys  are unremarkable and stable. Stable large right renal cyst. The bladder appears normal. Stomach/Bowel: The stomach, duodenum, small bowel and colon are grossly normal. Large amount of stool throughout the colon and down into the rectum suggesting constipation. Vascular/Lymphatic: Stable age advanced atherosclerotic calcifications involving the distal aorta and iliac arteries but no aneurysm or dissection. The branch vessels are patent. The major venous structures are patent. New right-sided retroperitoneal lymph nodes with a intra aortocaval lymph node measuring 9.5 mm on image 62/2. Reproductive: The uterus and ovaries are normal. Other: No pelvic mass or pelvic adenopathy. No free pelvic fluid collections. No inguinal mass or adenopathy. Musculoskeletal: No significant bony findings. IMPRESSION: 1. Stable right lower lobe lung lesion. 2. Right lower lobe and right middle lobe bronchopneumonia. 3. New 5 mm peripheral nodule in the right upper lobe could be inflammatory but metastatic disease is not excluded. Recommend attention on follow-up. 4. Interval development of extensive hepatic metastatic disease. 5. Stable bilateral adrenal gland metastasis. 6. New right-sided retroperitoneal lymph nodes. 7. Interval marked enlargement of the thyroid gland with multiple large nodules. Could not exclude the possibility of metastatic disease although that would be pretty unusual. 8. PET-CT may be helpful for further evaluation, if necessary. 9. Large amount of stool throughout the colon and down into the rectum suggesting constipation. Aortic Atherosclerosis (ICD10-I70.0). Electronically Signed   By: Marijo Sanes M.D.   On: 04/24/2020 11:25     EKG: Independently reviewed.  Sinus rhythm, tachycardia, QTC 410, low voltage, LAE, nonspecific T wave change  Assessment/Plan Principal Problem:   HCAP (healthcare-associated pneumonia) Active Problems:   Malignant neoplasm of lower lobe of right lung (HCC)   Depression  with anxiety   GERD (gastroesophageal reflux disease)   History of DVT of lower extremity   Subclinical hyperthyroidism   Generalized weakness   RUQ pain   Sepsis ([place])   Hypercalcemia   Sepsis due to HCAP (healthcare-associated pneumonia): CT scan showed right lower lobe and right middle lobe infiltration.  Patient meets critical for sepsis with leukocytosis, tachycardia and tachypnea.  Blood pressures are soft.  Lactic acid is  normal.  Currently hemodynamically stable  - Will admit to med-surg bed as inpt - IV Vancomycin and cefepime (patient received 1 dose of Flagyl in ED) - Mucinex for cough  - Bronchodilators - Urine legionella and S. pneumococcal antigen - Follow up blood culture x2, sputum culture - will get Procalcitonin  - IVF: 2L of LR, then 125 mL per hour of NS  Malignant neoplasm of lower lobe of right lung ([place]): Metastasized to the brain, adrenal and liver.  Patient was on chemotherapy, followed up with Dr. Janese Banks -Dr. Janese Banks is consulted by EDP  -possible hospice facility placement  Depression and anxiety: Stable, no suicidal or homicidal ideations. -Continue home medications   GERD (gastroesophageal reflux disease) -protonix  History of DVT of lower extremity -on Eliquis  Subclinical hyperthyroidism -on methimazole  Generalized weakness: -pt/ot  RUQ pain: Likely due to metastasized to liver disease -prn oxycodone  Hypercalcemia: Ca 11.0, most likely due to malignancy -On IVF -check PTH and PTH-peptid -f/u by BMP --> if persistently elevated, may need to give pamidronate     DVT ppx: on Eliquis Code Status: DNR (I discussed with patient and explained the meaning of CODE STATUS. Patient wants sto be DNR) Family Communication: not done, no family member is at bed side.      Disposition Plan:  To be determined, likely hospice facility Consults called:  Dr. Janese Banks of oncology Admission status: Med-surg bed as inpt       Status is: Inpatient  Remains  inpatient appropriate because:Inpatient level of care appropriate due to severity of illness.  Patient has multiple comorbidities, now presents with sepsis due to HCAP.  Patient's presentation is highly complicated.  Will need to be treated in hospital for at least 2 days.   Dispo: The patient is from: Home              Anticipated d/c is to: To be determined, likely hospice facility              Anticipated d/c date is: 2 days              Patient currently is not medically stable to d/c.            Date of Service 04/18/2020    Ivor Costa [facility]   If 7PM-7AM, please contact night-coverage [email or web address] 04/30/2020, 6:45 PM

## 2020-04-25 NOTE — ED Notes (Signed)
Pt has Fentanyl patch applied to left posterior upper arm 60mcg.  Pt had old 67mcg patch that was stuck to shirt, that was discarded. Witnessed by Alwyn Pea and Airline pilot.

## 2020-04-25 NOTE — Progress Notes (Signed)
Patient son, [name] called requesting if patient twin sister which is Deborah Nguyen can come to sit with patient tomorrow.

## 2020-04-25 NOTE — Consult Note (Signed)
Hematology/Oncology Consult note [facility] [phone] [phone]  Patient Care Team: Tracie Harrier, MD as PCP - General (Internal Medicine) Telford Nab, RN as Registered Nurse Sindy Guadeloupe, MD as Consulting Physician (Hematology and Oncology)   Name of the patient: Deborah Nguyen  [id number]  1960/12/18    Reason for referral-history of stage IV lung cancer admitted for acute hypoxic respiratory failure   Referring physician- Dr. 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  She received 4 cycles of carbotaxol and Keytruda and was on maintenance Keytruda with last cycle given on 03/18/2020.  Her scans in February 2021 had shown stable disease.  She was admitted to the hospital for progressive weakness on 04/06/2020 and was discharged to a rehab.  He has received whole brain radiation for brain metastases.  MRI brain on 03/25/2020 showed mixed response with 3 new lesions and improvement in the 3 old lesions.  Following her hospitalization in early May patient was discharged to a rehab and plan was to get repeat scans as an outpatient and her treatment was on hold  Patient was supposed to see me as an outpatient tomorrow but was admitted to the hospital with progressive weakness and shortness of breath.  She is currently being treated for healthcare associated pneumonia.She had repeat CT chest abdomen and pelvis done in the hospital which showed stable right lower lobe lesion but interval development of extensive hepatic metastatic disease with the largest lesion measuring 7.7 cm as well as marked enlargement of the thyroid gland with multiple large nodules  ECOG PS- 4  Pain scale- 5   Review of systems- Review of Systems  Constitutional: Positive for malaise/fatigue  and weight loss. Negative for chills and fever.  HENT: Negative for congestion, ear discharge and nosebleeds.   Eyes: Negative for blurred vision.  Respiratory: Negative for cough, hemoptysis, sputum production, shortness of breath and wheezing.   Cardiovascular: Negative for chest pain, palpitations, orthopnea and claudication.  Gastrointestinal: Positive for abdominal pain. Negative for blood in stool, constipation, diarrhea, heartburn, melena, nausea and vomiting.  Genitourinary: Negative for dysuria, flank pain, frequency, hematuria and urgency.  Musculoskeletal: Negative for back pain, joint pain and myalgias.  Skin: Negative for rash.  Neurological: Negative for dizziness, tingling, focal weakness, seizures, weakness and headaches.  Endo/Heme/Allergies: Does not bruise/bleed easily.  Psychiatric/Behavioral: Negative for depression and suicidal ideas. The patient does not have insomnia.     Allergies  Allergen Reactions  . Morphine And Related Anaphylaxis  . Paxil [Paroxetine Hcl] Other (See Comments)    Hallucinations   . Ciprofloxacin Other (See Comments)    Unknown  . Levaquin [Levofloxacin] Other (See Comments)    Unknown   . 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 She has an increase risk of malignant hyperthermia with anesthesia. She should not receive Propofol.   Donne Hazel [Nefazodone] Other (See Comments)    Unknown   . Biaxin [Clarithromycin] Nausea Only  . Erythromycin Nausea Only  . Penicillins Rash    Patient Active Problem List   Diagnosis Date Noted  . HCAP (healthcare-associated pneumonia) 05/01/2020  . Sepsis ([place]) 04/05/2020  . Right flank pain 04/18/2020  . Hypercalcemia 04/19/2020  . Generalized weakness 04/06/2020  . Lung cancer  metastatic to brain ([place]) 04/06/2020  . Fever 04/06/2020  . RUQ pain 04/06/2020  . Acute metabolic encephalopathy 94/[id number]/5929  . Anemia in neoplastic disease  04/06/2020  . Neoplastic malignant related fatigue   . Brain metastases ([place])   . Port-A-Cath in place 01/15/2020  . Muscular dystrophy ([place]) 10/24/2019  . Thyroid nodule 10/24/2019  . Goals of care, counseling/discussion 09/20/2019  . 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 Malignant neoplasm of lower lobe of right lung ([place]) 09/20/2019  . GAD (generalized anxiety disorder) 09/01/2018  . History of kidney stones 09/01/2018  . Subclinical hyperthyroidism 01/24/2017  . Central core myopathy 09/17/2016  . Low serum vitamin D 09/17/2016  . Hx of adenomatous colonic polyps 08/01/2015  . History of DVT of lower extremity 05/23/2015  . Depression with anxiety 05/31/2014  . GERD (gastroesophageal reflux disease) 05/31/2014  . Febrile illness 05/06/2013  . Multinodular goiter (nontoxic) 07/05/2011     Past Medical History:  Diagnosis Date  . Anxiety   . Arthritis   . Chronic kidney disease   . Family history of breast cancer    aunt  . GERD (gastroesophageal reflux disease)   . History of DVT of lower extremity    Left leg  . Lung cancer ([place])    with brain mets  . Lung mass   . Malignant hyperthermia   . 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[place])    mild form - per patient central cord disease  . Muscular dystrophy ([place])   . Muscular dystrophy [facility])      Past Surgical History:  Procedure Laterality Date  . CESAREAN SECTION  11/1985  . COLONOSCOPY N/A 10/27/2015   Procedure: COLONOSCOPY;  Surgeon: Manya Silvas, MD;  Location[facility] ENDOSCOPY;  Service: Endoscopy;  Laterality: N/A;   NO Propofol - per office  . LITHOTRIPSY    . PORTA CATH INSERTION N/A 11/09/2019   Procedure: PORTA CATH INSERTION;  Surgeon: Algernon Huxley, MD;  Location: [place] [facility];  Service: Cardiovascular;  Laterality: N/A;  . PORTA CATH INSERTION N/A 01/23/2020   Procedure: PORTA CATH INSERTION;  Surgeon: Algernon Huxley, MD;  Location: [place] [facility];  Service:  Cardiovascular;  Laterality: N/A;  . PORTA CATH INSERTION N/A 02/01/2020   Procedure: PORTA CATH INSERTION;  Surgeon: Algernon Huxley, MD;  Location: [place] [facility];  Service: Cardiovascular;  Laterality: N/A;  . TUBAL LIGATION  03/1986    Social History   Socioeconomic History  . Marital status: Divorced    Spouse name: Not on file  . Number of children: Not on file  . Years of education: Not on file  . Highest education level: Not on file  Occupational History  . Not on file  Tobacco Use  . Smoking status: Former Smoker    Packs/day: 1.00    Types: Cigarettes    Quit date: 05/2016    Years since quitting: 3.9  . Smokeless tobacco: Never Used  Substance and Sexual Activity  . Alcohol use: No  . Drug use: No  . Sexual activity: Not Currently  Other Topics Concern  . Not on file  Social History Narrative  . Not on file   Social Determinants of Health   Financial Resource Strain:   . Difficulty of Paying Living Expenses:   Food Insecurity:   . Worried About Charity fundraiser in the Last Year:   . [place] in the Last Year:  Transportation Needs:   . Film/video editor (Medical):   Marland Kitchen Lack of Transportation (Non-Medical):   Physical Activity:   . Days of Exercise per Week:   . Minutes of Exercise per Session:   Stress:   . Feeling of Stress :   Social Connections:   . Frequency of Communication with Friends and Family:   . Frequency of Social Gatherings with Friends and Family:   . Attends Religious Services:   . Active Member of Clubs or Organizations:   . Attends Archivist Meetings:   Marland Kitchen Marital Status:   Intimate Partner Violence:   . Fear of Current or Ex-Partner:   . Emotionally Abused:   Marland Kitchen Physically Abused:   . Sexually Abused:      Family History  Problem Relation Age of Onset  . Cancer Father        bladder, lung  . Breast cancer Maternal Aunt 45     Current Facility-Administered Medications:  .  0.9 %  sodium  chloride infusion, , Intravenous, Continuous, Ivor Costa, MD .  acetaminophen (TYLENOL) tablet 650 mg, 650 mg, Oral, Q6H PRN, Ivor Costa, MD .  albuterol (VENTOLIN HFA) 108 (90 Base) MCG/ACT inhaler 2 puff, 2 puff, Inhalation, Q4H PRN, Ivor Costa, MD .  dextromethorphan-guaiFENesin ([place] DM) 30-600 MG per 12 hr tablet 1 tablet, 1 tablet, Oral, BID PRN, Ivor Costa, MD .  ipratropium (ATROVENT) nebulizer solution 0.5 mg, 2.5 mL, Inhalation, Q6H, Ivor Costa, MD .  ondansetron (ZOFRAN) injection 4 mg, 4 mg, Intravenous, Q8H PRN, Ivor Costa, MD  Current Outpatient Medications:  .  citalopram (CELEXA) 40 MG tablet, Take 1 tablet (40 mg total) by mouth daily., Disp: 30 tablet, Rfl: 3 .  docusate sodium (COLACE) 100 MG capsule, Take 200 mg by mouth 2 (two) times daily., Disp: , Rfl:  .  ELIQUIS 5 MG TABS tablet, TAKE 1 TABLET BY MOUTH TWICE A DAY (Patient taking differently: Take 5 mg by mouth 2 (two) times daily. ), Disp: 60 tablet, Rfl: 2 .  fentaNYL (DURAGESIC) 25 MCG/HR, Place 1 patch onto the skin every 3 (three) days., Disp: 5 patch, Rfl: 0 .  methimazole (TAPAZOLE) 5 MG tablet, Take 5 mg by mouth daily., Disp: , Rfl:  .  OLANZapine (ZYPREXA) 10 MG tablet, TAKE ONE TABLET BY MOUTH AT BEDTIME (Patient taking differently: Take 5 mg by mouth at bedtime. ), Disp: 30 tablet, Rfl: 1 .  ondansetron (ZOFRAN-ODT) 8 MG disintegrating tablet, Take 8 mg by mouth every 6 (six) hours as needed. , Disp: , Rfl:  .  Oxycodone HCl 10 MG TABS, Take 1 tablet (10 mg total) by mouth every 4 (four) hours as needed., Disp: 12 tablet, Rfl: 0 .  pantoprazole (PROTONIX) 20 MG tablet, TAKE 1 TABLET BY MOUTH 2 TIMES DAILY (Patient taking differently: Take 20 mg by mouth 2 (two) times daily before a meal. ), Disp: 120 tablet, Rfl: 0 .  polyethylene glycol (MIRALAX) 17 g packet, Take 17 g by mouth 2 (two) times daily., Disp: , Rfl:  .  acetaminophen (TYLENOL) 325 MG tablet, Take 650 mg by mouth every 6 (six) hours as needed.,  Disp: , Rfl:  .  dexamethasone (DECADRON) 4 MG tablet, Take 1 tablet (4 mg total) by mouth every 12 (twelve) hours. (Patient not taking: Reported on 04/18/2020), Disp:  , Rfl:  .  furosemide (LASIX) 20 MG tablet, Take 20 mg by mouth as needed., Disp: , Rfl:  .  predniSONE (DELTASONE) 10  MG tablet, Take 1 tablet (10 mg total) by mouth 2 (two) times daily with a meal., Disp: 14 tablet, Rfl: 0   Physical exam:  Vitals:   04/14/2020 1030 04/12/2020 1130 05/05/2020 1200 04/17/2020 1230  BP: 106/67 113/73 115/68 110/62  Pulse: 92 89 85 84  Resp: '14 17 16 18  ' Temp:      TempSrc:      SpO2: 98% 97% 97% 97%  Weight:      Height:       Physical Exam Constitutional:      Comments: Fatigued emotional  Cardiovascular:     Rate and Rhythm: Regular rhythm. Tachycardia present.  Pulmonary:     Effort: Pulmonary effort is normal.     Breath sounds: Normal breath sounds.  Abdominal:     Comments: Diffuse TTP  Skin:    General: Skin is warm and dry.  Neurological:     General: No focal deficit present.     Mental Status: She is alert.        CMP Latest Ref Rng & Units 04/27/2020  Glucose 70 - 99 mg/dL 112(H)  BUN 6 - 20 mg/dL 25(H)  Creatinine 0.44 - 1.00 mg/dL 0.37(L)  Sodium 135 - 145 mmol/L 135  Potassium 3.5 - 5.1 mmol/L 5.0  Chloride 98 - 111 mmol/L 97(L)  CO2 22 - 32 mmol/L 28  Calcium 8.9 - 10.3 mg/dL 11.0(H)  Total Protein 6.5 - 8.1 g/dL 7.7  Total Bilirubin 0.3 - 1.2 mg/dL 0.9  Alkaline Phos 38 - 126 U/L 151(H)  AST 15 - 41 U/L 34  ALT 0 - 44 U/L 35   CBC Latest Ref Rng & Units 04/05/2020  WBC 4.0 - 10.5 K/uL 21.3(H)  Hemoglobin 12.0 - 15.0 g/dL 11.4(L)  Hematocrit 36.0 - 46.0 % 35.7(L)  Platelets 150 - 400 K/uL 279    '@IMAGES' @  CT Head Wo Contrast  Result Date: 04/06/2020 CLINICAL DATA:  Weakness. EXAM: CT HEAD WITHOUT CONTRAST TECHNIQUE: Contiguous axial images were obtained from the base of the skull through the vertex without intravenous contrast. COMPARISON:  None.  FINDINGS: Brain: There is mild cerebral atrophy with widening of the extra-axial spaces and ventricular dilatation. There are areas of decreased attenuation within the white matter tracts of the supratentorial brain, consistent with microvascular disease changes. Vascular: No hyperdense vessel or unexpected calcification. Skull: Normal. Negative for fracture or focal lesion. Sinuses/Orbits: No acute finding. Other: None. IMPRESSION: 1. No acute intracranial abnormality. 2. Mild cerebral atrophy and microvascular disease changes of the supratentorial brain. Electronically Signed   By: Virgina Norfolk M.D.   On: 04/06/2020 01:12   CT CHEST W CONTRAST  Result Date: 04/09/2020 CLINICAL DATA:  Altered mental status and right upper quadrant abdominal pain. History of stage IV non-small cell lung cancer. EXAM: CT CHEST, ABDOMEN, AND PELVIS WITH CONTRAST TECHNIQUE: Multidetector CT imaging of the chest, abdomen and pelvis was performed following the standard protocol during bolus administration of intravenous contrast. CONTRAST:  140m OMNIPAQUE IOHEXOL 300 MG/ML  SOLN COMPARISON:  01/28/2020 FINDINGS: CT CHEST FINDINGS Cardiovascular: The heart is normal in size. No pericardial effusion. The aorta is normal in caliber. No dissection. Stable minimal calcifications. The branch vessels are patent. No definite coronary artery calcifications. Mediastinum/Nodes: Slightly progressive soft tissue density noted in the subcarinal region with maximum measurement of 9 mm. This previously measured 6.5 mm. I do not see any other enlarged mediastinal or hilar lymph nodes. The esophagus is grossly normal. Lungs/Pleura: The right lower  lobe lesion is grossly stable. It is a little hard to measure because of new eventration/elevation of the right hemidiaphragm. Right lower lobe and right middle lobe peribronchial thickening and patchy airspace opacity the suggesting bronchopneumonia. A few scattered tiny pulmonary nodules are noted  some of which are calcified granulomas. New 5 mm peripheral nodule in the right upper lobe on image number 78/4 could be inflammatory but metastatic disease is not excluded. Subpleural density in the left lower lobe on image 77/4 measures 9 mm and is likely inflammatory or focus of atelectasis. Musculoskeletal: Interval marked enlargement of the thyroid gland multiple large nodules. I am not sure hydro explain this rapid progression. Could not exclude the possibility of metastatic disease although that would be pretty unusual. No axillary or supraclavicular adenopathy. No breast masses. The bony structures are intact. No worrisome bone lesions. CT ABDOMEN PELVIS FINDINGS Hepatobiliary: Interval development of extensive hepatic metastatic disease. I do not see any lesions even in retrospect on the prior study from February 22nd. This would suggest very aggressive metastatic disease. 4.6 cm lesion in segment 4A. Coalescent lesions in the right hepatic lobe in and around the caudate lobe measures a maximum of 7.7 cm. 2.7 cm lesion in segment 6 on image 53/2. 2.5 cm segment 6 lesion on image 59/2. The gallbladder is contracted. No intra or extrahepatic biliary dilatation. Pancreas: No mass, inflammation or ductal dilatation. Spleen: Normal size. No focal lesions. Adrenals/Urinary Tract: Bilateral adrenal gland metastasis appear relatively stable. The the kidneys are unremarkable and stable. Stable large right renal cyst. The bladder appears normal. Stomach/Bowel: The stomach, duodenum, small bowel and colon are grossly normal. Large amount of stool throughout the colon and down into the rectum suggesting constipation. Vascular/Lymphatic: Stable age advanced atherosclerotic calcifications involving the distal aorta and iliac arteries but no aneurysm or dissection. The branch vessels are patent. The major venous structures are patent. New right-sided retroperitoneal lymph nodes with a intra aortocaval lymph node measuring  9.5 mm on image 62/2. Reproductive: The uterus and ovaries are normal. Other: No pelvic mass or pelvic adenopathy. No free pelvic fluid collections. No inguinal mass or adenopathy. Musculoskeletal: No significant bony findings. IMPRESSION: 1. Stable right lower lobe lung lesion. 2. Right lower lobe and right middle lobe bronchopneumonia. 3. New 5 mm peripheral nodule in the right upper lobe could be inflammatory but metastatic disease is not excluded. Recommend attention on follow-up. 4. Interval development of extensive hepatic metastatic disease. 5. Stable bilateral adrenal gland metastasis. 6. New right-sided retroperitoneal lymph nodes. 7. Interval marked enlargement of the thyroid gland with multiple large nodules. Could not exclude the possibility of metastatic disease although that would be pretty unusual. 8. PET-CT may be helpful for further evaluation, if necessary. 9. Large amount of stool throughout the colon and down into the rectum suggesting constipation. Aortic Atherosclerosis (ICD10-I70.0). Electronically Signed   By: Marijo Sanes M.D.   On: 04/24/2020 11:25   CT ABDOMEN PELVIS W CONTRAST  Result Date: 04/19/2020 CLINICAL DATA:  Altered mental status and right upper quadrant abdominal pain. History of stage IV non-small cell lung cancer. EXAM: CT CHEST, ABDOMEN, AND PELVIS WITH CONTRAST TECHNIQUE: Multidetector CT imaging of the chest, abdomen and pelvis was performed following the standard protocol during bolus administration of intravenous contrast. CONTRAST:  119m OMNIPAQUE IOHEXOL 300 MG/ML  SOLN COMPARISON:  01/28/2020 FINDINGS: CT CHEST FINDINGS Cardiovascular: The heart is normal in size. No pericardial effusion. The aorta is normal in caliber. No dissection. Stable minimal calcifications. The  branch vessels are patent. No definite coronary artery calcifications. Mediastinum/Nodes: Slightly progressive soft tissue density noted in the subcarinal region with maximum measurement of 9 mm.  This previously measured 6.5 mm. I do not see any other enlarged mediastinal or hilar lymph nodes. The esophagus is grossly normal. Lungs/Pleura: The right lower lobe lesion is grossly stable. It is a little hard to measure because of new eventration/elevation of the right hemidiaphragm. Right lower lobe and right middle lobe peribronchial thickening and patchy airspace opacity the suggesting bronchopneumonia. A few scattered tiny pulmonary nodules are noted some of which are calcified granulomas. New 5 mm peripheral nodule in the right upper lobe on image number 78/4 could be inflammatory but metastatic disease is not excluded. Subpleural density in the left lower lobe on image 77/4 measures 9 mm and is likely inflammatory or focus of atelectasis. Musculoskeletal: Interval marked enlargement of the thyroid gland multiple large nodules. I am not sure hydro explain this rapid progression. Could not exclude the possibility of metastatic disease although that would be pretty unusual. No axillary or supraclavicular adenopathy. No breast masses. The bony structures are intact. No worrisome bone lesions. CT ABDOMEN PELVIS FINDINGS Hepatobiliary: Interval development of extensive hepatic metastatic disease. I do not see any lesions even in retrospect on the prior study from February 22nd. This would suggest very aggressive metastatic disease. 4.6 cm lesion in segment 4A. Coalescent lesions in the right hepatic lobe in and around the caudate lobe measures a maximum of 7.7 cm. 2.7 cm lesion in segment 6 on image 53/2. 2.5 cm segment 6 lesion on image 59/2. The gallbladder is contracted. No intra or extrahepatic biliary dilatation. Pancreas: No mass, inflammation or ductal dilatation. Spleen: Normal size. No focal lesions. Adrenals/Urinary Tract: Bilateral adrenal gland metastasis appear relatively stable. The the kidneys are unremarkable and stable. Stable large right renal cyst. The bladder appears normal. Stomach/Bowel:  The stomach, duodenum, small bowel and colon are grossly normal. Large amount of stool throughout the colon and down into the rectum suggesting constipation. Vascular/Lymphatic: Stable age advanced atherosclerotic calcifications involving the distal aorta and iliac arteries but no aneurysm or dissection. The branch vessels are patent. The major venous structures are patent. New right-sided retroperitoneal lymph nodes with a intra aortocaval lymph node measuring 9.5 mm on image 62/2. Reproductive: The uterus and ovaries are normal. Other: No pelvic mass or pelvic adenopathy. No free pelvic fluid collections. No inguinal mass or adenopathy. Musculoskeletal: No significant bony findings. IMPRESSION: 1. Stable right lower lobe lung lesion. 2. Right lower lobe and right middle lobe bronchopneumonia. 3. New 5 mm peripheral nodule in the right upper lobe could be inflammatory but metastatic disease is not excluded. Recommend attention on follow-up. 4. Interval development of extensive hepatic metastatic disease. 5. Stable bilateral adrenal gland metastasis. 6. New right-sided retroperitoneal lymph nodes. 7. Interval marked enlargement of the thyroid gland with multiple large nodules. Could not exclude the possibility of metastatic disease although that would be pretty unusual. 8. PET-CT may be helpful for further evaluation, if necessary. 9. Large amount of stool throughout the colon and down into the rectum suggesting constipation. Aortic Atherosclerosis (ICD10-I70.0). Electronically Signed   By: Marijo Sanes M.D.   On: 04/21/2020 11:25   DG Chest Portable 1 View  Result Date: 04/05/2020 CLINICAL DATA:  Weakness. History of lung cancer. Progressive weakness. EXAM: PORTABLE CHEST 1 VIEW COMPARISON:  Radiograph 12/27/2019. CT 01/28/2020 FINDINGS: Tip of the right chest port in the SVC. Mild elevation of right hemidiaphragm  with streaky opacity at the right lung base, corresponding to nodule on CT. Minor streaky left  lung base atelectasis. The heart is normal in size. Normal mediastinal contours. No pleural fluid or pneumothorax. No acute osseous abnormalities are seen. IMPRESSION: 1. Mild elevation of the right hemidiaphragm with streaky opacity at the right lung base, corresponding to nodule on CT. 2. Minor streaky left lung base atelectasis. Electronically Signed   By: Keith Rake M.D.   On: 04/05/2020 23:55   US Abdomen Limited RUQ  Result Date: 04/06/2020 CLINICAL DATA:  Right upper quadrant pain and nausea. EXAM: ULTRASOUND ABDOMEN LIMITED RIGHT UPPER QUADRANT COMPARISON:  CT 01/28/2020 FINDINGS: Gallbladder: No gallstones or wall thickening visualized. No sonographic Murphy sign noted by sonographer. Common bile duct: Diameter: 8 mm unchanged. Liver: Mild increased parenchymal echogenicity centrally suggesting steatosis. No focal mass. Portal vein is patent on color Doppler imaging with normal direction of blood flow towards the liver. Other: Incidental right renal cyst unchanged. IMPRESSION: No acute findings. Electronically Signed   By: Marin Olp M.D.   On: 04/06/2020 16:24    Assessment and plan- Patient is a 59 y.o. female with stage IV squamous cell carcinoma of the lung s/p 4 cycles of carbotaxol chemotherapy and most recently on maintenance immunotherapy last cycle given on 03/18/2020.  She has been admitted for progressive weakness secondary to healthcare associated pneumonia.  CT scan showed progression of disease   I have reviewed CT chest abdomen and pelvis images independently and discussed with patient and her son  Met with patient and her son at the bedside. Her performance status has declined significantly. She is dependent on others for her ADL's. She is not a candidate for further systemic therapy. She is a DNR. Patient and son agreeable to hospice. Home hospice is likely not an option as her sister [name] who is her primary caregiver cannot care for her singlehandedly. Hospice home  versus ECF with hospice are potential options. Prognosis is likely in days- weeks.  Also discussed case with NP Altha Harm from palliative care. He will try to get hospice to see her to facilitate further goals of care.  Continue present management of pneumonia and eventual transition to comfort measures  Recommend prn iv morphine 4 mg Q2-3 hours prn for pain control. Continue fentanyl patch 25 mcg  Hypercalcemia- likely from dehydration and malignancy. Continue IVF   Total face to face encounter time for this patient visit was 40 min.      Visit Diagnosis 1. Community acquired pneumonia of right lower lobe of lung   2. Malignant neoplasm of lung, unspecified laterality, unspecified part of lung (HCC)                                                                                 3. Hypercalcemia  4. Goals of care counseling/discussion Dr. Randa Evens, MD, MPH [facility] [phone] 04/24/2020 4:13 PM

## 2020-04-25 NOTE — ED Notes (Signed)
Provider outside of room speaking w/ pt's son.

## 2020-04-25 NOTE — ED Provider Notes (Signed)
[facility] Emergency Department Provider Note   ____________________________________________    I have reviewed the triage vital signs and the nursing notes.   HISTORY  Chief Complaint Altered Mental Status     HPI Deborah Nguyen is a 59 y.o. 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  Patient reports this morning she was too weak to get out of bed.  She denies dysuria, reports mild cough.  No chemotherapy in over a week.  Complains of flank pain on the right which is chronic for her and related to her adrenal mass.  No vomiting.  No rash.  No dysuria  Past Medical History:  Diagnosis Date  . Anxiety   . Arthritis   . Chronic kidney disease   . Family history of breast cancer    aunt  . GERD (gastroesophageal reflux disease)   . History of DVT of lower extremity    Left leg  . Lung cancer ([place])    with brain mets  . Lung mass   . Malignant hyperthermia   . 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 Muscular dystrophy ([place])   . Muscular dystrophy [facility])     Patient Active Problem List   Diagnosis Date Noted  . HCAP (healthcare-associated pneumonia) 05/04/2020  . Sepsis ([place]) 04/17/2020  . Right flank pain 04/24/2020  . Hypercalcemia 04/15/2020  . Generalized weakness 04/06/2020  . Lung cancer metastatic to brain ([place]) 04/06/2020  . Fever 04/06/2020  . RUQ pain 04/06/2020  . Acute metabolic encephalopathy 25/49/8264  . 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 Neoplastic malignant related fatigue   . Brain metastases ([place])   . Port-A-Cath in place 01/15/2020  . Muscular dystrophy ([place]) 10/24/2019  . Thyroid nodule 10/24/2019  . Goals of care, counseling/discussion 09/20/2019  . 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 Malignant neoplasm of lower lobe of right lung ([place]) 09/20/2019  . GAD  (generalized anxiety disorder) 09/01/2018  . History of kidney stones 09/01/2018  . Subclinical hyperthyroidism 01/24/2017  . Central core myopathy 09/17/2016  . Low serum vitamin D 09/17/2016  . Hx of adenomatous colonic polyps 08/01/2015  . History of DVT of lower extremity 05/23/2015  . Depression with anxiety 05/31/2014  . GERD (gastroesophageal reflux disease) 05/31/2014  . Febrile illness 05/06/2013  . 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 CESAREAN SECTION  11/1985  . COLONOSCOPY N/A 10/27/2015   Procedure: COLONOSCOPY;  Surgeon: Manya Silvas, MD;  [facility]COPY;  Service: Endoscopy;  Laterality: N/A;   NO Propofol - per office  . LITHOTRIPSY    . PORTA CATH INSERTION N/A 11/09/2019   Procedure: PORTA CATH INSERTION;  Surgeon: Algernon Huxley, MD;  Location: [place] CV LAB;  Service: Cardiovascular;  Laterality: N/A;  . PORTA CATH INSERTION N/A 01/23/2020   Procedure: PORTA CATH INSERTION;  Surgeon: Algernon Huxley, MD;  Location: [place] CV LAB;  Service: Cardiovascular;  Laterality: N/A;  . PORTA CATH INSERTION N/A 02/01/2020   Procedure: PORTA CATH INSERTION;  Surgeon: Algernon Huxley, MD;  Location: [place] CV LAB;  Service: Cardiovascular;  Laterality: N/A;  . TUBAL LIGATION  03/1986    Prior to Admission medications   Medication Sig Start Date End Date Taking? Authorizing Provider  citalopram (CELEXA)  40 MG tablet Take 1 tablet (40 mg total) by mouth daily. 12/11/19  Yes Borders, Kirt Boys, NP  docusate sodium (COLACE) 100 MG capsule Take 200 mg by mouth 2 (two) times daily.   Yes [provider]  ELIQUIS 5 MG TABS tablet TAKE 1 TABLET BY MOUTH TWICE A DAY Patient taking differently: Take 5 mg by mouth 2 (two) times daily.  03/17/20  Yes Sindy Guadeloupe, MD  fentaNYL (DURAGESIC) 25 MCG/HR Place 1 patch onto the skin every 3 (three) days. 04/21/20  Yes Borders, Kirt Boys, NP  methimazole  (TAPAZOLE) 5 MG tablet Take 5 mg by mouth daily.   Yes [provider]  OLANZapine (ZYPREXA) 10 MG tablet TAKE ONE TABLET BY MOUTH AT BEDTIME Patient taking differently: Take 5 mg by mouth at bedtime.  02/15/20  Yes Burns, Wandra Feinstein, NP  ondansetron (ZOFRAN-ODT) 8 MG disintegrating tablet Take 8 mg by mouth every 6 (six) hours as needed.  01/16/20  Yes [provider]  Oxycodone HCl 10 MG TABS Take 1 tablet (10 mg total) by mouth every 4 (four) hours as needed. 04/10/20  Yes Fritzi Mandes, MD  pantoprazole (PROTONIX) 20 MG tablet TAKE 1 TABLET BY MOUTH 2 TIMES DAILY Patient taking differently: Take 20 mg by mouth 2 (two) times daily before a meal.  02/27/20  Yes Sindy Guadeloupe, MD  polyethylene glycol (MIRALAX) 17 g packet Take 17 g by mouth 2 (two) times daily.   Yes [provider]  acetaminophen (TYLENOL) 325 MG tablet Take 650 mg by mouth every 6 (six) hours as needed.    [provider]  dexamethasone (DECADRON) 4 MG tablet Take 1 tablet (4 mg total) by mouth every 12 (twelve) hours. Patient not taking: Reported on 04/18/2020 04/08/20   Samuella Cota, MD  furosemide (LASIX) 20 MG tablet Take 20 mg by mouth as needed.    [provider]  predniSONE (DELTASONE) 10 MG tablet Take 1 tablet (10 mg total) by mouth 2 (two) times daily with a meal. 04/24/20   Sindy Guadeloupe, MD  pantoprazole (PROTONIX) 20 MG tablet TAKE 1 TABLET BY MOUTH 2 TIMES DAILY 12/18/19   Sindy Guadeloupe, MD     Allergies Morphine and related, Paxil [paroxetine hcl], Ciprofloxacin, Levaquin [levofloxacin], Propofol, Serzone [nefazodone], Biaxin [clarithromycin], Erythromycin, and Penicillins  Family History  Problem Relation Age of Onset  . Cancer Father        bladder, lung  . Breast cancer Maternal Aunt 22    Social History Social History   Tobacco Use  . Smoking status: Former Smoker    Packs/day: 1.00    Types: Cigarettes    Quit date: 05/2016    Years since quitting:  3.9  . Smokeless tobacco: Never Used  Substance Use Topics  . Alcohol use: No  . Drug use: No    Review of Systems  Constitutional: Some chills Eyes: No visual changes.  ENT: No sore throat. Cardiovascular: Denies chest pain. Respiratory: As above Gastrointestinal: As above Genitourinary: As above Musculoskeletal: Negative for back pain. Skin: Negative for rash. Neurological: Negative for headaches or weakness   ____________________________________________   PHYSICAL EXAM:  VITAL SIGNS: ED Triage Vitals  Enc Vitals Group     BP 04/11/2020 0908 94/66     Pulse Rate 04/07/2020 0908 (!) 109     Resp 05/03/2020 0908 16     Temp 05/05/2020 0908 99.9 F (37.7 C)     Temp Source 04/14/2020 0908  Oral     SpO2 04/26/2020 0908 95 %     Weight 04/12/2020 0910 60.8 kg (134 lb)     Height 04/09/2020 0910 1.549 m (5\' 1" )     Head Circumference --      Peak Flow --      Pain Score 04/24/2020 0910 8     Pain Loc --      Pain Edu? --      Excl. in [place]? --     Constitutional: Alert and oriented.  Eyes: Conjunctivae are normal.   Nose: No congestion/rhinnorhea. Mouth/Throat: Mucous membranes are moist.   Neck:  Painless ROM Cardiovascular: Tachycardia, regular rhythm. Grossly normal heart sounds.  Good peripheral circulation.  Right chest port Respiratory: Normal respiratory effort.  No retractions.  Gastrointestinal: Soft and nontender. No distention.  No CVA tenderness.  Musculoskeletal: No lower extremity tenderness nor edema.  Warm and well perfused Neurologic:  Normal speech and language. No gross focal neurologic deficits are appreciated.  Skin:  Skin is warm, dry and intact. No rash noted. Psychiatric: Mood and affect are normal. Speech and behavior are normal.  ____________________________________________   LABS (all labs ordered are listed, but only abnormal results are displayed)  Labs Reviewed  COMPREHENSIVE METABOLIC PANEL - Abnormal; Notable for the following components:       Result Value   Chloride 97 (*)    Glucose, Bld 112 (*)    BUN 25 (*)    Creatinine, Ser 0.37 (*)    Calcium 11.0 (*)    Albumin 2.7 (*)    Alkaline Phosphatase 151 (*)    All other components within normal limits  CBC WITH DIFFERENTIAL/PLATELET - Abnormal; Notable for the following components:   WBC 21.3 (*)    RBC 3.75 (*)    Hemoglobin 11.4 (*)    HCT 35.7 (*)    Neutro Abs 19.5 (*)    Lymphs Abs 0.6 (*)    Abs Immature Granulocytes 0.22 (*)    All other components within normal limits  PROTIME-INR - Abnormal; Notable for the following components:   Prothrombin Time 17.8 (*)    INR 1.5 (*)    All other components within normal limits  URINALYSIS, COMPLETE (UACMP) WITH MICROSCOPIC - Abnormal; Notable for the following components:   Color, Urine YELLOW (*)    APPearance HAZY (*)    All other components within normal limits  CULTURE, BLOOD (ROUTINE X 2)  CULTURE, BLOOD (ROUTINE X 2)  URINE CULTURE  SARS CORONAVIRUS 2 BY RT PCR (HOSPITAL ORDER, [place] [facility])  LACTIC ACID, PLASMA   ____________________________________________  EKG  ED ECG REPORT I, Lavonia Drafts, the attending physician, personally viewed and interpreted this ECG.  Date: 04/23/2020  Rhythm: Sinus tachycardia QRS Axis: normal Intervals: normal ST/T Wave abnormalities: normal Narrative Interpretation: no evidence of acute ischemia  ____________________________________________  RADIOLOGY  CT chest abdomen pelvis demonstrates changes in malignancy but also right-sided pneumonia ____________________________________________   PROCEDURES  Procedure(s) performed: No  Procedures   Critical Care performed: No ____________________________________________   INITIAL IMPRESSION / ASSESSMENT AND PLAN / ED COURSE  Pertinent labs & imaging results that were available during my care of the patient were reviewed by me and considered in my medical decision making (see chart for  details).  Patient presents with complaints of weakness found to be tachycardic with elevated temperature and borderline blood pressure here.  Highly concerning for sepsis.  No recent chemotherapy, differential also includes electrolyte abnormalities,  anemia.  No shortness of breath or pleurisy to suggest PE.  Code sepsis activated, will cover with broad-spectrum antibiotics, she is high risk for pneumonia.  Communicated with Dr. Janese Banks her oncologist, Dr. Janese Banks request CT abdomen pelvis and chest as the patient was supposed to have that recently but missed appointment  CT scan demonstrates right-sided pneumonia, have discussed with the hospitalist.  Notify Dr. Janese Banks at the patient will be admitted    ____________________________________________   FINAL CLINICAL IMPRESSION(S) / ED DIAGNOSES  Final diagnoses:  Community acquired pneumonia of right lower lobe of lung  Malignant neoplasm of lung, unspecified laterality, unspecified part of lung ([place])        Note:  This document was prepared using Dragon voice recognition software and may include unintentional dictation errors.   Lavonia Drafts, MD 04/08/2020 1228

## 2020-04-25 NOTE — Progress Notes (Signed)
Pharmacy Antibiotic Note  Deborah Nguyen is a 59 y.o. female admitted on 04/24/2020 with pneumonia.  Pharmacy has been consulted for Vancomycin/cefepime  Dosing.  Pt received vancomycin 1 g IV x1 and cefepime 2g IV x1 in the ED.  Plan: Cefepime 2 g IV q8h  Vancomycin 500 mg IV q12h per [place] [facility]   Will need to follow up with SCr in AM MRSA PCR ordered to help guide therapy   Height: 5\' 1"  (154.9 cm) Weight: 60.8 kg (134 lb) IBW/kg (Calculated) : 47.8  Temp (24hrs), Avg:99.9 F (37.7 C), Min:99.9 F (37.7 C), Max:99.9 F (37.7 C)  Recent Labs  Lab 05/05/2020 0913 04/11/2020 0924  WBC  --  21.3*  CREATININE  --  0.37*  LATICACIDVEN 1.3  --     Estimated Creatinine Clearance: 63.4 mL/min (A) (by C-G formula based on SCr of 0.37 mg/dL (L)).    Allergies  Allergen Reactions  . Morphine And Related Anaphylaxis  . Paxil [Paroxetine Hcl] Other (See Comments)    Hallucinations   . Ciprofloxacin Other (See Comments)    Unknown  . Levaquin [Levofloxacin] Other (See Comments)    Unknown   . 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 She has an increase risk of malignant hyperthermia with anesthesia. She should not receive Propofol.   Donne Hazel [Nefazodone] Other (See Comments)    Unknown   . Biaxin [Clarithromycin] Nausea Only  . Erythromycin Nausea Only  . Penicillins Rash    Antimicrobials this admission: Cefepime  5/21 >>  vancomycin 5/21 >>  Flagyl 5/21 x1  Dose adjustments this admission:   Microbiology results: 5/21 BCx: collected 5/21 UCx: collected  5/21 Sputum: ordered  5/21 MRSA PCR: ordered  Thank you for allowing pharmacy to be a part of this patient's care.  Rocky Morel 04/09/2020 1:51 PM

## 2020-04-25 NOTE — Progress Notes (Signed)
PHARMACY -  BRIEF ANTIBIOTIC NOTE   Pharmacy has received consult(s) for vancomycin and cefepime from an ED provider.  The patient's profile has been reviewed for ht/wt/allergies/indication/available labs.    One time order(s) placed for vanc 1 g + cefepime 2 g  Further antibiotics/pharmacy consults should be ordered by admitting physician if indicated.                       Thank you,  Tawnya Crook, PharmD 04/23/2020  9:51 AM

## 2020-04-26 DIAGNOSIS — C7801 Secondary malignant neoplasm of right lung: Secondary | ICD-10-CM

## 2020-04-26 LAB — BASIC METABOLIC PANEL
Anion gap: 7 (ref 5–15)
BUN: 12 mg/dL (ref 6–20)
CO2: 29 mmol/L (ref 22–32)
Calcium: 11.1 mg/dL — ABNORMAL HIGH (ref 8.9–10.3)
Chloride: 98 mmol/L (ref 98–111)
Creatinine, Ser: <0.3 mg/dL — ABNORMAL LOW (ref 0.44–1.00)
Glucose, Bld: 87 mg/dL (ref 70–99)
Potassium: 4.7 mmol/L (ref 3.5–5.1)
Sodium: 134 mmol/L — ABNORMAL LOW (ref 135–145)

## 2020-04-26 LAB — CBC
HCT: 30.1 % — ABNORMAL LOW (ref 36.0–46.0)
Hemoglobin: 9.3 g/dL — ABNORMAL LOW (ref 12.0–15.0)
MCH: 29.7 pg (ref 26.0–34.0)
MCHC: 30.9 g/dL (ref 30.0–36.0)
MCV: 96.2 fL (ref 80.0–100.0)
Platelets: 246 10*3/uL (ref 150–400)
RBC: 3.13 MIL/uL — ABNORMAL LOW (ref 3.87–5.11)
RDW: 13.8 % (ref 11.5–15.5)
WBC: 17.2 10*3/uL — ABNORMAL HIGH (ref 4.0–10.5)
nRBC: 0 % (ref 0.0–0.2)

## 2020-04-26 LAB — URINE CULTURE: Culture: NO GROWTH

## 2020-04-26 LAB — HIV ANTIBODY (ROUTINE TESTING W REFLEX): HIV Screen 4th Generation wRfx: NONREACTIVE

## 2020-04-26 MED ORDER — SCOPOLAMINE 1 MG/3DAYS TD PT72
1.0000 | MEDICATED_PATCH | TRANSDERMAL | Status: DC
  Administered 2020-04-26 – 2020-04-29 (×2): 1.5 mg via TRANSDERMAL
  Filled 2020-04-26 (×2): qty 1

## 2020-04-26 MED ORDER — ATROPINE SULFATE 1 % OP SOLN
2.0000 [drp] | Freq: Four times a day (QID) | OPHTHALMIC | Status: DC | PRN
  Administered 2020-04-26: 2 [drp] via SUBLINGUAL
  Filled 2020-04-26 (×2): qty 2

## 2020-04-26 MED ORDER — HYDROMORPHONE HCL 1 MG/ML IJ SOLN
0.5000 mg | INTRAMUSCULAR | Status: DC | PRN
  Administered 2020-04-26 – 2020-04-27 (×8): 0.5 mg via INTRAVENOUS
  Filled 2020-04-26 (×8): qty 0.5

## 2020-04-26 MED ORDER — IPRATROPIUM BROMIDE 0.02 % IN SOLN
2.5000 mL | Freq: Three times a day (TID) | RESPIRATORY_TRACT | Status: DC
  Administered 2020-04-26: 0.5 mg via RESPIRATORY_TRACT
  Filled 2020-04-26: qty 2.5

## 2020-04-26 MED ORDER — GLYCOPYRROLATE 0.2 MG/ML IJ SOLN
0.2000 mg | INTRAMUSCULAR | Status: DC | PRN
  Administered 2020-04-26 – 2020-04-28 (×6): 0.2 mg via INTRAVENOUS
  Filled 2020-04-26 (×9): qty 1

## 2020-04-26 MED ORDER — ACETAMINOPHEN 650 MG RE SUPP: 325.0000 mg | RECTAL | Status: DC | PRN

## 2020-04-26 NOTE — Progress Notes (Signed)
Nutrition Brief Note  Chart reviewed. Pt now transitioning to comfort care, plans to discharge to residential hospice Monday or Tuesday. No nutrition interventions warranted at this time.  Please consult as needed.   Lajuan Lines, RD, LDN Clinical Nutrition After Hours/Weekend Pager # in [place]

## 2020-04-26 NOTE — Hospital Course (Addendum)
Deborah Nguyen is a 59 y.o. 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  In the ED, low grade fever 99.9 F, borderline BP 94/66, hypoxic with O2 sat 85% on room air which resolved after short use of nonrebreather mask, later 97% on room air.  Labs notable for leukocytosis 21.3k, INR 1.5, lactic acid 1.3, negative COVID-19, negative UA.   CT's of chest/abdomen/pelvis were obtained, showed right middle lobe bronchopneumonia and several new metastatic lesions in the liver, right upper lobe, bilateral adrenals, thyroid and new retroperitoneal lymphadenopathy. Admitted to hospitalist service with oncology consulted.    Palliative care met with patient and family upon admission, and plan is for patient to go to hospice house once a bed is available.  In the meantime, comfort care meaures have been initiated in the hospital after speak with patient's son who decided against continuing antibiotics for pneumonia pending hospice bed, as patient appears to be declining quite rapidly.

## 2020-04-26 NOTE — Progress Notes (Signed)
[place] visited pt. as follow-up from [place] and [place] visit yesterday evening in ED; RN shared pt. is nonverbal, family present in rm.  Pt. is allegedly on comfort care, but order had not yet been placed by MD. [place] entered rm. and met pt.'s twin sister [name] @ bedside as well as pt.'s niece.  Sister shared pt. was diagnosed w/cancer 50mo ago in October; medical team said she might live 1-2 yrs with treatment and pt. decided to fight the cancer via various treatments.  [facility] underwent chemo and immunotherapy; [facility] CT scan revealed cancer spread throughout body, stage IV.  This news was difficult to hear when medical team shared it yesterday, sister said.  Sister and pt. are very close and have lived most of their lives in close proximity to one another.  Sister's husband died four years ago; she moved into pt.'s apartment complex earlier this month to be better able to care for pt.  Unfortunately, pt. has been in rehab and hospital ever since sister moved.  Sister views this move as divine provision, however, and feels that she was allowed to make this shift for a reason.  Pt./sister's parents both died within the last few years.  Sister expressed, 'I don't know what I'm going to do without [Pt.]; I can't imagine life without her.'  Plan is [place]. [facility] facilitated life review w/sister and provided supportive listening for pt. and her cousin.  [facility] [place] [facility] [place] [facility] [facility]; no further needs at this time.    04/26/20 1100  Clinical Encounter Type  Visited With Patient and family together;Health care provider  Visit Type Follow-up;Social support;Psychological support;Patient actively dying  Referral From Nurse;Chaplain  Spiritual Encounters  Spiritual Needs Emotional;Grief support  Stress Factors  Family Stress Factors Health changes;Loss;Major life changes;Loss of  control

## 2020-04-26 NOTE — Progress Notes (Signed)
PROGRESS NOTE    Deborah Nguyen   KTG:[id number]  DOB: 01-24-1961  PCP: Tracie Harrier, MD    DOA: 04/06/2020 LOS: 1   Brief Narrative   Deborah Nguyen is a 59 y.o. 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  In the ED, low grade fever 99.9 F, borderline BP 94/66, hypoxic with O2 sat 85% on room air which resolved after short use of nonrebreather mask, later 97% on room air.  Labs notable for leukocytosis 21.3k, INR 1.5, lactic acid 1.3, negative COVID-19, negative UA.   CT's of chest/abdomen/pelvis were obtained, showed right middle lobe bronchopneumonia and several new metastatic lesions in the liver, right upper lobe, bilateral adrenals, thyroid and new retroperitoneal lymphadenopathy. Admitted to hospitalist service with oncology consulted.    Palliative care met with patient and family upon admission, and plan is for patient to go to hospice house once a bed is available.  In the meantime, comfort care meaures have been initiated in the hospital after speak with patient's son who decided against continuing antibiotics for pneumonia pending hospice bed, as patient appears to be declining quite rapidly.     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  Full comfort measures in the meantime. --comfort care per orders --notify attending if any signs of discomfort/pain/distress   Metastatic squamous cell carcinoma of the right lung - now with widespread, progressive development of new metastatic lesions as outlined above.   --oncology and palliative care following --PO meds for chronic conditions stopped as patient now aspirating with swallow  Port-A-Cath in place - no acute issues. Monitor.  Sepsis due to HCAP - present on admission with leukocytosis, tachycardia, and RML pneumonia.  Normal lactic acid.  Antibiotics stopped per family request to pursue full comfort measures.   Problems this admission no longer actively treating: Hypercalcemia RUQ pain Depression with anxiety  GERD (gastroesophageal reflux disease) History of DVT of lower extremity  Subclinical hyperthyroidism Generalized weakness - due to malignancy and chemotherapy   DVT prophylaxis: none (comfort care status)  Diet:  Diet Orders (From admission, onward)    Start     Ordered   04/21/2020 1334  Diet regular Room service appropriate? Yes; Fluid consistency: Thin  Diet effective now    Question Answer Comment  Room service appropriate? Yes   Fluid consistency: Thin      04/23/2020 1334            Code Status: DNR    Subjective 04/26/20    Patient seen with twin sister, Deborah Nguyen, at bedside.  Patient was able to vocal feeling little better initially, but later in encounter was not able to vocalize answers to questions.  Sister reports patient choked earlier with attempt to drink  some water.  Starting to have some secretion sounds.  No other acute events.     Disposition Plan & Communication   Status is: Inpatient  Remains inpatient appropriate because:she is comfort care status awaiting hospice bed.   Dispo: The patient is from: Home              Anticipated d/c is to: Hospice house              Anticipated d/c date is: 2  days              Patient currently is not medically stable to d/c.   Family Communication: twin sister, Deborah Nguyen, was at bedside during encounter.  Son, Deborah Nguyen, patient's surrogate decision-maker updated by phone.  Discussed full comfort vs continuing to treat the pneumonia.  Given her decline over past 24 hours, he decided to stop the antibitiocs, does not want to prolong her suffering.  Agrees with full comfort care measures.    Consults, Procedures, Significant Events   Consultants:   Oncology  Palliative Care  Hospice  Procedures:   none  Antimicrobials:   Vanc & Cefepime 5/21 >> 5/22     Objective   Vitals:   04/10/2020 1342 04/16/2020 1400 05/04/2020 1703 04/23/2020 1959  BP: 106/64 (!) 100/57 93/61 (!) 92/54  Pulse: 92 76 90 92  Resp: '18 19 18   ' Temp:   98.2 F (36.8 C) 99.4 F (37.4 C)  TempSrc:   Oral Oral  SpO2: 99% 96% 98% 98%  Weight:      Height:        Intake/Output Summary (Last 24 hours) at 04/26/2020 1341 Last data filed at 04/26/2020 1224 Gross per 24 hour  Intake 0 ml  Output 950 ml  Net -950 ml   Filed Weights   04/18/2020 0910  Weight: 60.8 kg    Physical Exam:  General exam: awake, alert, no acute distress Respiratory system: upper airway secretion sounds but lungs otherwise clear on anterior auscultation, no wheezes, rales or rhonchi, normal respiratory effort. Cardiovascular system: normal S1/S2, RRR, no pedal edema.   Skin: dry, intact, normal temperature  Labs   Data Reviewed: I have personally reviewed following labs and imaging studies  CBC: Recent Labs  Lab 04/09/2020 0924 04/26/20 0425  WBC 21.3* 17.2*  NEUTROABS 19.5*  --   HGB 11.4* 9.3*  HCT 35.7* 30.1*  MCV 95.2 96.2  PLT 279 630   Basic Metabolic Panel: Recent Labs  Lab 04/18/2020 0924 04/26/20 0425  NA 135 134*  K 5.0 4.7  CL 97* 98  CO2 28 29  GLUCOSE 112* 87  BUN 25* 12  CREATININE 0.37* <0.30*  CALCIUM 11.0* 11.1*   GFR: CrCl cannot be calculated (This lab  value cannot be used to calculate CrCl because it is not a number: <0.30). Liver Function Tests: Recent Labs  Lab 04/09/2020 0924  AST 34  ALT 35  ALKPHOS 151*  BILITOT 0.9  PROT 7.7  ALBUMIN 2.7*   No results for input(s): LIPASE, AMYLASE in the last 168 hours. No results for input(s): AMMONIA in the last 168 hours. Coagulation Profile: Recent Labs  Lab 04/06/2020 0924  INR 1.5*   Cardiac Enzymes: No results for input(s): CKTOTAL, CKMB, CKMBINDEX, TROPONINI in the last 168 hours. BNP (last 3 results) No results for input(s): PROBNP in the last 8760 hours. HbA1C: No results for input(s): HGBA1C in the last 72 hours. CBG: No results for input(s): GLUCAP in the last 168  hours. Lipid Profile: No results for input(s): CHOL, HDL, LDLCALC, TRIG, CHOLHDL, LDLDIRECT in the last 72 hours. Thyroid Function Tests: No results for input(s): TSH, T4TOTAL, FREET4, T3FREE, THYROIDAB in the last 72 hours. Anemia Panel: No results for input(s): VITAMINB12, FOLATE, FERRITIN, TIBC, IRON, RETICCTPCT in the last 72 hours. Sepsis Labs: Recent Labs  Lab 04/13/2020 0913 04/21/2020 0924  PROCALCITON  --  0.58  LATICACIDVEN 1.3  --     Recent Results (from the past 240 hour(s))  Culture, blood (Routine x 2)     Status: None (Preliminary result)   Collection Time: 04/14/2020  9:24 AM   Specimen: BLOOD  Result Value Ref Range Status   Specimen Description BLOOD LAC  Final   Special Requests   Final    BOTTLES DRAWN AEROBIC AND ANAEROBIC Blood Culture adequate volume   Culture   Final    NO GROWTH < 24 HOURS Performed at [facility], [address] [place], [place] [address]    Report Status PENDING  Incomplete  Urine culture     Status: None   Collection Time: 04/30/2020  9:24 AM   Specimen: In/Out Cath Urine  Result Value Ref Range Status   Specimen Description   Final    IN/OUT CATH URINE Performed at [facility], [address] [place], [place] [address]    Special  Requests   Final    NONE Performed at [facility], [address], [place], [place] [address]    Culture   Final    NO GROWTH Performed at [facility], [place] [address] [place], [place] [address]    Report Status 04/26/2020 FINAL  Final  Culture, blood (Routine x 2)     Status: None (Preliminary result)   Collection Time: 04/23/2020  9:47 AM   Specimen: BLOOD  Result Value Ref Range Status   Specimen Description BLOOD RIGHT AC  Final   Special Requests   Final    BOTTLES DRAWN AEROBIC AND ANAEROBIC Blood Culture results may not be optimal due to an excessive volume of blood received in culture bottles   Culture   Final    NO GROWTH < 24 HOURS Performed at [facility], [address], [place], [place] [address]    Report Status PENDING  Incomplete  SARS Coronavirus 2 by RT PCR (hospital order, performed in [place] hospital lab) Nasopharyngeal Nasopharyngeal Swab     Status: None   Collection Time: 04/17/2020 12:09 PM   Specimen: Nasopharyngeal Swab  Result Value Ref Range Status   SARS Coronavirus 2 NEGATIVE NEGATIVE Final    Comment: (NOTE) SARS-CoV-2 target nucleic acids are NOT DETECTED. The SARS-CoV-2 RNA is generally detectable in upper and lower respiratory specimens during the acute phase of infection. The lowest concentration of SARS-CoV-2 viral copies this assay can detect is 250 copies / mL. A negative result does not preclude SARS-CoV-2 infection and should not be used as the sole basis for treatment or other patient management decisions.  A negative result may occur with improper specimen collection / handling, submission of specimen other than nasopharyngeal swab, presence of viral mutation(s) within the areas targeted by this assay, and inadequate number of viral copies (<250 copies / mL). A negative result must be combined with clinical observations, patient history, and epidemiological information. Fact Sheet for Patients:     StrictlyIdeas.no Fact Sheet for Healthcare Providers: BankingDealers.co.za This test is not yet approved or cleared  by the [place]  FDA and has been authorized for detection and/or diagnosis of SARS-CoV-2 by FDA under an Emergency Use Authorization (EUA).  This EUA will remain in effect (meaning this test can be used) for the duration of the COVID-19 declaration under Section 564(b)(1) of the Act, 21 U.S.C. section 360bbb-3(b)(1), unless the authorization is terminated or revoked sooner. Performed at [facility], [address] [place], [place] [address]       Imaging Studies   CT CHEST W CONTRAST  Result Date: 04/20/2020 CLINICAL DATA:  Altered mental status and right upper quadrant abdominal pain. History of stage IV non-small cell lung cancer. EXAM: CT CHEST, ABDOMEN, AND PELVIS WITH CONTRAST TECHNIQUE: Multidetector CT imaging of the chest, abdomen and pelvis was performed following the standard protocol during bolus administration of intravenous contrast. CONTRAST:  159m OMNIPAQUE IOHEXOL 300 MG/ML  SOLN COMPARISON:  01/28/2020 FINDINGS: CT CHEST FINDINGS Cardiovascular: The heart is normal in size. No pericardial effusion. The aorta is normal in caliber. No dissection. Stable minimal calcifications. The branch vessels are patent. No definite coronary artery calcifications. Mediastinum/Nodes: Slightly progressive soft tissue density noted in the subcarinal region with maximum measurement of 9 mm. This previously measured 6.5 mm. I do not see any other enlarged mediastinal or hilar lymph nodes. The esophagus is grossly normal. Lungs/Pleura: The right lower lobe lesion is grossly stable. It is a little hard to measure because of new eventration/elevation of the right hemidiaphragm. Right lower lobe and right middle lobe peribronchial thickening and patchy airspace opacity the suggesting bronchopneumonia. A few scattered tiny  pulmonary nodules are noted some of which are calcified granulomas. New 5 mm peripheral nodule in the right upper lobe on image number 78/4 could be inflammatory but metastatic disease is not excluded. Subpleural density in the left lower lobe on image 77/4 measures 9 mm and is likely inflammatory or focus of atelectasis. Musculoskeletal: Interval marked enlargement of the thyroid gland multiple large nodules. I am not sure hydro explain this rapid progression. Could not exclude the possibility of metastatic disease although that would be pretty unusual. No axillary or supraclavicular adenopathy. No breast masses. The bony structures are intact. No worrisome bone lesions. CT ABDOMEN PELVIS FINDINGS Hepatobiliary: Interval development of extensive hepatic metastatic disease. I do not see any lesions even in retrospect on the prior study from February 22nd. This would suggest very aggressive metastatic disease. 4.6 cm lesion in segment 4A. Coalescent lesions in the right hepatic lobe in and around the caudate lobe measures a maximum of 7.7 cm. 2.7 cm lesion in segment 6 on image 53/2. 2.5 cm segment 6 lesion on image 59/2. The gallbladder is contracted. No intra or extrahepatic biliary dilatation. Pancreas: No mass, inflammation or ductal dilatation. Spleen: Normal size. No focal lesions. Adrenals/Urinary Tract: Bilateral adrenal gland metastasis appear relatively stable. The the kidneys are unremarkable and stable. Stable large right renal cyst. The bladder appears normal. Stomach/Bowel: The stomach, duodenum, small bowel and colon are grossly normal. Large amount of stool throughout the colon and down into the rectum suggesting constipation. Vascular/Lymphatic: Stable age advanced atherosclerotic calcifications involving the distal aorta and iliac arteries but no aneurysm or dissection. The branch vessels are patent. The major venous structures are patent. New right-sided retroperitoneal lymph nodes with a intra  aortocaval lymph node measuring 9.5 mm on image 62/2. Reproductive: The uterus and ovaries are normal. Other: No pelvic mass or pelvic adenopathy. No free pelvic fluid collections. No inguinal mass or adenopathy. Musculoskeletal: No significant bony findings. IMPRESSION:  1. Stable right lower lobe lung lesion. 2. Right lower lobe and right middle lobe bronchopneumonia. 3. New 5 mm peripheral nodule in the right upper lobe could be inflammatory but metastatic disease is not excluded. Recommend attention on follow-up. 4. Interval development of extensive hepatic metastatic disease. 5. Stable bilateral adrenal gland metastasis. 6. New right-sided retroperitoneal lymph nodes. 7. Interval marked enlargement of the thyroid gland with multiple large nodules. Could not exclude the possibility of metastatic disease although that would be pretty unusual. 8. PET-CT may be helpful for further evaluation, if necessary. 9. Large amount of stool throughout the colon and down into the rectum suggesting constipation. Aortic Atherosclerosis (ICD10-I70.0). Electronically Signed   By: Marijo Sanes M.D.   On: 04/28/2020 11:25   CT ABDOMEN PELVIS W CONTRAST  Result Date: 05/04/2020 CLINICAL DATA:  Altered mental status and right upper quadrant abdominal pain. History of stage IV non-small cell lung cancer. EXAM: CT CHEST, ABDOMEN, AND PELVIS WITH CONTRAST TECHNIQUE: Multidetector CT imaging of the chest, abdomen and pelvis was performed following the standard protocol during bolus administration of intravenous contrast. CONTRAST:  129m OMNIPAQUE IOHEXOL 300 MG/ML  SOLN COMPARISON:  01/28/2020 FINDINGS: CT CHEST FINDINGS Cardiovascular: The heart is normal in size. No pericardial effusion. The aorta is normal in caliber. No dissection. Stable minimal calcifications. The branch vessels are patent. No definite coronary artery calcifications. Mediastinum/Nodes: Slightly progressive soft tissue density noted in the subcarinal region  with maximum measurement of 9 mm. This previously measured 6.5 mm. I do not see any other enlarged mediastinal or hilar lymph nodes. The esophagus is grossly normal. Lungs/Pleura: The right lower lobe lesion is grossly stable. It is a little hard to measure because of new eventration/elevation of the right hemidiaphragm. Right lower lobe and right middle lobe peribronchial thickening and patchy airspace opacity the suggesting bronchopneumonia. A few scattered tiny pulmonary nodules are noted some of which are calcified granulomas. New 5 mm peripheral nodule in the right upper lobe on image number 78/4 could be inflammatory but metastatic disease is not excluded. Subpleural density in the left lower lobe on image 77/4 measures 9 mm and is likely inflammatory or focus of atelectasis. Musculoskeletal: Interval marked enlargement of the thyroid gland multiple large nodules. I am not sure hydro explain this rapid progression. Could not exclude the possibility of metastatic disease although that would be pretty unusual. No axillary or supraclavicular adenopathy. No breast masses. The bony structures are intact. No worrisome bone lesions. CT ABDOMEN PELVIS FINDINGS Hepatobiliary: Interval development of extensive hepatic metastatic disease. I do not see any lesions even in retrospect on the prior study from February 22nd. This would suggest very aggressive metastatic disease. 4.6 cm lesion in segment 4A. Coalescent lesions in the right hepatic lobe in and around the caudate lobe measures a maximum of 7.7 cm. 2.7 cm lesion in segment 6 on image 53/2. 2.5 cm segment 6 lesion on image 59/2. The gallbladder is contracted. No intra or extrahepatic biliary dilatation. Pancreas: No mass, inflammation or ductal dilatation. Spleen: Normal size. No focal lesions. Adrenals/Urinary Tract: Bilateral adrenal gland metastasis appear relatively stable. The the kidneys are unremarkable and stable. Stable large right renal cyst. The bladder  appears normal. Stomach/Bowel: The stomach, duodenum, small bowel and colon are grossly normal. Large amount of stool throughout the colon and down into the rectum suggesting constipation. Vascular/Lymphatic: Stable age advanced atherosclerotic calcifications involving the distal aorta and iliac arteries but no aneurysm or dissection. The branch vessels are patent. The  major venous structures are patent. New right-sided retroperitoneal lymph nodes with a intra aortocaval lymph node measuring 9.5 mm on image 62/2. Reproductive: The uterus and ovaries are normal. Other: No pelvic mass or pelvic adenopathy. No free pelvic fluid collections. No inguinal mass or adenopathy. Musculoskeletal: No significant bony findings. IMPRESSION: 1. Stable right lower lobe lung lesion. 2. Right lower lobe and right middle lobe bronchopneumonia. 3. New 5 mm peripheral nodule in the right upper lobe could be inflammatory but metastatic disease is not excluded. Recommend attention on follow-up. 4. Interval development of extensive hepatic metastatic disease. 5. Stable bilateral adrenal gland metastasis. 6. New right-sided retroperitoneal lymph nodes. 7. Interval marked enlargement of the thyroid gland with multiple large nodules. Could not exclude the possibility of metastatic disease although that would be pretty unusual. 8. PET-CT may be helpful for further evaluation, if necessary. 9. Large amount of stool throughout the colon and down into the rectum suggesting constipation. Aortic Atherosclerosis (ICD10-I70.0). Electronically Signed   By: Marijo Sanes M.D.   On: 04/19/2020 11:25     Medications   Scheduled Meds: . docusate sodium  200 mg Oral BID  . fentaNYL  1 patch Transdermal Q72H  . ipratropium  2.5 mL Inhalation TID  . methimazole  5 mg Oral Daily  . polyethylene glycol  17 g Oral Daily  . scopolamine  1 patch Transdermal Q72H  . senna  1 tablet Oral Daily   Continuous Infusions:     LOS: 1 day    Time  spent: 45 minutes total, with > 50% spent in coordination of care and direct patient contact.    Ezekiel Slocumb, DO [facility]  04/26/2020, 1:41 PM    If 7PM-7AM, please contact night-coverage. How to contact the [facility] Attending or Consulting provider [place] or covering provider during after hours [place], for this patient?    1. Check the care team in [facility] and look for a) attending/consulting [facility] provider listed and b) the [facility] team listed 2. Log into [email or web address] and use [place]'s universal password to access. If you do not have the password, please contact the hospital operator. 3. Locate the [facility] provider you are looking for under [facility] and page to a number that you can be directly reached. 4. If you still have difficulty reaching the provider, please page the [facility] (Director on Call) for the Hospitalists listed on amion for assistance.

## 2020-04-27 LAB — PARATHYROID HORMONE, INTACT (NO CA): PTH: 7 pg/mL — ABNORMAL LOW (ref 15–65)

## 2020-04-27 MED ORDER — HYDROMORPHONE HCL 1 MG/ML IJ SOLN
1.0000 mg | INTRAMUSCULAR | Status: DC | PRN
  Administered 2020-04-27 – 2020-04-28 (×9): 1 mg via INTRAVENOUS
  Filled 2020-04-27 (×9): qty 1

## 2020-04-27 MED ORDER — HYDROMORPHONE HCL 1 MG/ML IJ SOLN
1.0000 mg | Freq: Once | INTRAMUSCULAR | Status: AC
  Administered 2020-04-27: 1 mg via INTRAVENOUS
  Filled 2020-04-27: qty 1

## 2020-04-27 MED ORDER — BISACODYL 10 MG RE SUPP: 10.0000 mg | Freq: Every day | RECTAL | Status: DC | PRN

## 2020-04-27 NOTE — Progress Notes (Signed)
Family at bedside  Stating patient yelled out that she couldn't breathe, Patient assessed, vital signs taken, patient repositioned in bed. Oxygen increased to 5 liters with nasal cannula with saturation in the 70's. Dilaudid given for pain per family request. Terrial Rhodes

## 2020-04-27 NOTE — Progress Notes (Signed)
PROGRESS NOTE    Deborah Nguyen   HYQ:[id number]  DOB: [birth year]  PCP: Tracie Harrier, MD    DOA: 04/21/2020 LOS: 2   Brief Narrative   Deborah Nguyen is a 59 y.o. 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  In the ED, low grade fever 99.9 F, borderline BP 94/66, hypoxic with O2 sat 85% on room air which resolved after short use of nonrebreather mask, later 97% on room air.  Labs notable for leukocytosis 21.3k, INR 1.5, lactic acid 1.3, negative COVID-19, negative UA.   CT's of chest/abdomen/pelvis were obtained, showed right middle lobe bronchopneumonia and several new metastatic lesions in the liver, right upper lobe, bilateral adrenals, thyroid and new retroperitoneal lymphadenopathy. Admitted to hospitalist service with oncology consulted.    Palliative care met with patient and family upon admission, and plan is for patient to go to hospice house once a bed is available.  In the meantime, comfort care meaures have been initiated in the hospital after speak with patient's son who decided against continuing antibiotics for pneumonia pending hospice bed, as patient appears to be declining quite rapidly.     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  Full comfort measures in the meantime. --comfort care per orders --notify attending if any signs of discomfort/pain/distress --suppository ordered if signs of abdominal discomfort (otherwise would hold given pain that would be caused by bed pan use and clean up)  Metastatic squamous cell carcinoma of the right lung - now with widespread, progressive development of new metastatic lesions as outlined above.   --oncology and palliative care following --PO meds for chronic conditions stopped as patient now aspirating with swallow  Port-A-Cath in place - no acute issues. Monitor.  Sepsis due to HCAP - present on admission with leukocytosis, tachycardia, and RML pneumonia.  Normal lactic acid.  Antibiotics stopped per family request to pursue full comfort measures.   Problems this admission no longer actively treating: Hypercalcemia RUQ pain Depression with anxiety  GERD (gastroesophageal reflux disease) History of DVT of lower extremity  Subclinical hyperthyroidism Generalized weakness - due to malignancy and chemotherapy   DVT prophylaxis: none (comfort care status)  Diet:  Diet Orders (From admission, onward)    Start     Ordered   04/05/2020 1334  Diet regular Room service appropriate? Yes; Fluid consistency: Thin  Diet effective now    Question Answer Comment  Room service appropriate? Yes   Fluid consistency: Thin      04/21/2020 1334            Code Status: DNR    Subjective 04/27/20    Patient seen with twin sister, and daughter-in-law at bedside this AM.  Daughter-in-law stayed overnight with patient.  Reports the gasping for air this morning is new.  Patient has been nonverbal, not really able to express herself.  She also reports patient seems to have very painful right arm when she wakes up.  No bowel movements.  No acute events reported.  Disposition Plan & Communication   Status is: Inpatient  Remains inpatient appropriate  because:she is comfort care status awaiting hospice bed.   Dispo: The patient is from: Home              Anticipated d/c is to: Hospice house              Anticipated d/c date is: 2 days              Patient currently is not medically stable to d/c.   Family Communication: twin sister, [name], and patient' daughter-in-law were both at bedside during encounter.    Consults, Procedures, Significant Events   Consultants:   Oncology  Palliative Care  Hospice  Procedures:   none  Antimicrobials:   Vanc & Cefepime 5/21 >> 5/22     Objective   Vitals:   05/03/2020 1959 04/26/20 1403 04/26/20 1823 04/26/20 2334  BP: (!) 92/54  98/74 90/64  Pulse: 92 (!) 103 (!) 119 (!) 110  Resp:  (!) '24 18 17  ' Temp: 99.4 F (37.4 C)  (!) 101.7 F (38.7 C) 99 F (37.2 C)  TempSrc: Oral  Oral Oral  SpO2: 98% (!) 78% (!) 85% (!) 87%  Weight:      Height:        Intake/Output Summary (Last 24 hours) at 04/27/2020 0738 Last data filed at 04/26/2020 2335 Gross per 24 hour  Intake 74.84 ml  Output 700 ml  Net -625.16 ml   Filed Weights   04/23/2020 0910  Weight: 60.8 kg    Physical Exam:  General exam: sleeping, no acute distress Respiratory system: upper airway secretion sounds, rhonchi, increased respiratory effort with accessory muscle use. Cardiovascular system: normal S1/S2, RRR, no pedal edema.   Skin: dry, intact, normal temperature  Labs   Data Reviewed: I have personally reviewed following labs and imaging studies  CBC: Recent Labs  Lab 04/19/2020 0924 04/26/20 0425  WBC 21.3* 17.2*  NEUTROABS 19.5*  --   HGB 11.4* 9.3*  HCT 35.7* 30.1*  MCV 95.2 96.2  PLT 279 443   Basic Metabolic Panel: Recent Labs  Lab 04/30/2020 0924 04/26/20 0425  NA 135 134*  K 5.0 4.7  CL 97* 98  CO2 28 29  GLUCOSE 112* 87  BUN 25* 12  CREATININE 0.37* <0.30*  CALCIUM 11.0* 11.1*   GFR: CrCl cannot be calculated (This lab value cannot be used to calculate CrCl because it is not a  number: <0.30). Liver Function Tests: Recent Labs  Lab 05/04/2020 0924  AST 34  ALT 35  ALKPHOS 151*  BILITOT 0.9  PROT 7.7  ALBUMIN 2.7*   No results for input(s): LIPASE, AMYLASE in the last 168 hours. No results for input(s): AMMONIA in the last 168 hours. Coagulation Profile: Recent Labs  Lab 05/01/2020 0924  INR 1.5*   Cardiac Enzymes: No results for input(s): CKTOTAL, CKMB, CKMBINDEX, TROPONINI in the last 168 hours. BNP (last 3 results) No results for input(s): PROBNP in the last 8760 hours. HbA1C: No results for input(s): HGBA1C in the last 72 hours. CBG: No results for input(s): GLUCAP in the last 168 hours. Lipid Profile: No results for input(s): CHOL, HDL, LDLCALC, TRIG, CHOLHDL, LDLDIRECT  in the last 72 hours. Thyroid Function Tests: No results for input(s): TSH, T4TOTAL, FREET4, T3FREE, THYROIDAB in the last 72 hours. Anemia Panel: No results for input(s): VITAMINB12, FOLATE, FERRITIN, TIBC, IRON, RETICCTPCT in the last 72 hours. Sepsis Labs: Recent Labs  Lab 04/15/2020 0913 04/21/2020 0924  PROCALCITON  --  0.58  LATICACIDVEN 1.3  --     Recent Results (from the past 240 hour(s))  Culture, blood (Routine x 2)     Status: None (Preliminary result)   Collection Time: 05/02/2020  9:24 AM   Specimen: BLOOD  Result Value Ref Range Status   Specimen Description BLOOD LAC  Final   Special Requests   Final    BOTTLES DRAWN AEROBIC AND ANAEROBIC Blood Culture adequate volume   Culture   Final    NO GROWTH 2 DAYS Performed at [facility], [address] [place], [place] [address]    Report Status PENDING  Incomplete  Urine culture     Status: None   Collection Time: 04/19/2020  9:24 AM   Specimen: In/Out Cath Urine  Result Value Ref Range Status   Specimen Description   Final    IN/OUT CATH URINE Performed at [facility], [address], [place], [place] [address]    Special Requests   Final    NONE Performed at [facility],  [address] [place], [place] [address]    Culture   Final    NO GROWTH Performed at [facility], [place] [address] [place], [place] [address]    Report Status 04/26/2020 FINAL  Final  Culture, blood (Routine x 2)     Status: None (Preliminary result)   Collection Time: 04/10/2020  9:47 AM   Specimen: BLOOD  Result Value Ref Range Status   Specimen Description BLOOD RIGHT AC  Final   Special Requests   Final    BOTTLES DRAWN AEROBIC AND ANAEROBIC Blood Culture results may not be optimal due to an excessive volume of blood received in culture bottles   Culture   Final    NO GROWTH 2 DAYS Performed at [facility], [address], [place], [place] [address]    Report Status PENDING  Incomplete  SARS Coronavirus 2 by RT PCR (hospital order, performed in [place] hospital lab) Nasopharyngeal Nasopharyngeal Swab     Status: None   Collection Time: 04/30/2020 12:09 PM   Specimen: Nasopharyngeal Swab  Result Value Ref Range Status   SARS Coronavirus 2 NEGATIVE NEGATIVE Final    Comment: (NOTE) SARS-CoV-2 target nucleic acids are NOT DETECTED. The SARS-CoV-2 RNA is generally detectable in upper and lower respiratory specimens during the acute phase of infection. The lowest concentration of SARS-CoV-2 viral copies this assay can detect is 250 copies / mL. A negative result does not preclude SARS-CoV-2 infection and should not be used as the sole basis for treatment or other patient management decisions.  A negative result may occur with improper specimen collection / handling, submission of specimen other than nasopharyngeal swab, presence of viral mutation(s) within the areas targeted by this assay, and inadequate number of viral copies (<250 copies / mL). A negative result must be combined with clinical observations, patient history, and epidemiological information. Fact Sheet for Patients:   StrictlyIdeas.no Fact Sheet for Healthcare  Providers: BankingDealers.co.za This test is not yet approved or cleared  by the [place] FDA and has been authorized for detection and/or diagnosis of SARS-CoV-2 by FDA under an Emergency  Use Authorization (EUA).  This EUA will remain in effect (meaning this test can be used) for the duration of the COVID-19 declaration under Section 564(b)(1) of the Act, 21 U.S.C. section 360bbb-3(b)(1), unless the authorization is terminated or revoked sooner. Performed at [facility], [address], [place], [place] [address]       Imaging Studies   CT CHEST W CONTRAST  Result Date: 04/24/2020 CLINICAL DATA:  Altered mental status and right upper quadrant abdominal pain. History of stage IV non-small cell lung cancer. EXAM: CT CHEST, ABDOMEN, AND PELVIS WITH CONTRAST TECHNIQUE: Multidetector CT imaging of the chest, abdomen and pelvis was performed following the standard protocol during bolus administration of intravenous contrast. CONTRAST:  111m OMNIPAQUE IOHEXOL 300 MG/ML  SOLN COMPARISON:  01/28/2020 FINDINGS: CT CHEST FINDINGS Cardiovascular: The heart is normal in size. No pericardial effusion. The aorta is normal in caliber. No dissection. Stable minimal calcifications. The branch vessels are patent. No definite coronary artery calcifications. Mediastinum/Nodes: Slightly progressive soft tissue density noted in the subcarinal region with maximum measurement of 9 mm. This previously measured 6.5 mm. I do not see any other enlarged mediastinal or hilar lymph nodes. The esophagus is grossly normal. Lungs/Pleura: The right lower lobe lesion is grossly stable. It is a little hard to measure because of new eventration/elevation of the right hemidiaphragm. Right lower lobe and right middle lobe peribronchial thickening and patchy airspace opacity the suggesting bronchopneumonia. A few scattered tiny pulmonary nodules are noted some of which are calcified granulomas. New 5  mm peripheral nodule in the right upper lobe on image number 78/4 could be inflammatory but metastatic disease is not excluded. Subpleural density in the left lower lobe on image 77/4 measures 9 mm and is likely inflammatory or focus of atelectasis. Musculoskeletal: Interval marked enlargement of the thyroid gland multiple large nodules. I am not sure hydro explain this rapid progression. Could not exclude the possibility of metastatic disease although that would be pretty unusual. No axillary or supraclavicular adenopathy. No breast masses. The bony structures are intact. No worrisome bone lesions. CT ABDOMEN PELVIS FINDINGS Hepatobiliary: Interval development of extensive hepatic metastatic disease. I do not see any lesions even in retrospect on the prior study from February 22nd. This would suggest very aggressive metastatic disease. 4.6 cm lesion in segment 4A. Coalescent lesions in the right hepatic lobe in and around the caudate lobe measures a maximum of 7.7 cm. 2.7 cm lesion in segment 6 on image 53/2. 2.5 cm segment 6 lesion on image 59/2. The gallbladder is contracted. No intra or extrahepatic biliary dilatation. Pancreas: No mass, inflammation or ductal dilatation. Spleen: Normal size. No focal lesions. Adrenals/Urinary Tract: Bilateral adrenal gland metastasis appear relatively stable. The the kidneys are unremarkable and stable. Stable large right renal cyst. The bladder appears normal. Stomach/Bowel: The stomach, duodenum, small bowel and colon are grossly normal. Large amount of stool throughout the colon and down into the rectum suggesting constipation. Vascular/Lymphatic: Stable age advanced atherosclerotic calcifications involving the distal aorta and iliac arteries but no aneurysm or dissection. The branch vessels are patent. The major venous structures are patent. New right-sided retroperitoneal lymph nodes with a intra aortocaval lymph node measuring 9.5 mm on image 62/2. Reproductive: The  uterus and ovaries are normal. Other: No pelvic mass or pelvic adenopathy. No free pelvic fluid collections. No inguinal mass or adenopathy. Musculoskeletal: No significant bony findings. IMPRESSION: 1. Stable right lower lobe lung lesion. 2. Right lower lobe and right middle lobe bronchopneumonia.  3. New 5 mm peripheral nodule in the right upper lobe could be inflammatory but metastatic disease is not excluded. Recommend attention on follow-up. 4. Interval development of extensive hepatic metastatic disease. 5. Stable bilateral adrenal gland metastasis. 6. New right-sided retroperitoneal lymph nodes. 7. Interval marked enlargement of the thyroid gland with multiple large nodules. Could not exclude the possibility of metastatic disease although that would be pretty unusual. 8. PET-CT may be helpful for further evaluation, if necessary. 9. Large amount of stool throughout the colon and down into the rectum suggesting constipation. Aortic Atherosclerosis (ICD10-I70.0). Electronically Signed   By: Marijo Sanes M.D.   On: 04/30/2020 11:25   CT ABDOMEN PELVIS W CONTRAST  Result Date: 04/15/2020 CLINICAL DATA:  Altered mental status and right upper quadrant abdominal pain. History of stage IV non-small cell lung cancer. EXAM: CT CHEST, ABDOMEN, AND PELVIS WITH CONTRAST TECHNIQUE: Multidetector CT imaging of the chest, abdomen and pelvis was performed following the standard protocol during bolus administration of intravenous contrast. CONTRAST:  135m OMNIPAQUE IOHEXOL 300 MG/ML  SOLN COMPARISON:  01/28/2020 FINDINGS: CT CHEST FINDINGS Cardiovascular: The heart is normal in size. No pericardial effusion. The aorta is normal in caliber. No dissection. Stable minimal calcifications. The branch vessels are patent. No definite coronary artery calcifications. Mediastinum/Nodes: Slightly progressive soft tissue density noted in the subcarinal region with maximum measurement of 9 mm. This previously measured 6.5 mm. I do not  see any other enlarged mediastinal or hilar lymph nodes. The esophagus is grossly normal. Lungs/Pleura: The right lower lobe lesion is grossly stable. It is a little hard to measure because of new eventration/elevation of the right hemidiaphragm. Right lower lobe and right middle lobe peribronchial thickening and patchy airspace opacity the suggesting bronchopneumonia. A few scattered tiny pulmonary nodules are noted some of which are calcified granulomas. New 5 mm peripheral nodule in the right upper lobe on image number 78/4 could be inflammatory but metastatic disease is not excluded. Subpleural density in the left lower lobe on image 77/4 measures 9 mm and is likely inflammatory or focus of atelectasis. Musculoskeletal: Interval marked enlargement of the thyroid gland multiple large nodules. I am not sure hydro explain this rapid progression. Could not exclude the possibility of metastatic disease although that would be pretty unusual. No axillary or supraclavicular adenopathy. No breast masses. The bony structures are intact. No worrisome bone lesions. CT ABDOMEN PELVIS FINDINGS Hepatobiliary: Interval development of extensive hepatic metastatic disease. I do not see any lesions even in retrospect on the prior study from February 22nd. This would suggest very aggressive metastatic disease. 4.6 cm lesion in segment 4A. Coalescent lesions in the right hepatic lobe in and around the caudate lobe measures a maximum of 7.7 cm. 2.7 cm lesion in segment 6 on image 53/2. 2.5 cm segment 6 lesion on image 59/2. The gallbladder is contracted. No intra or extrahepatic biliary dilatation. Pancreas: No mass, inflammation or ductal dilatation. Spleen: Normal size. No focal lesions. Adrenals/Urinary Tract: Bilateral adrenal gland metastasis appear relatively stable. The the kidneys are unremarkable and stable. Stable large right renal cyst. The bladder appears normal. Stomach/Bowel: The stomach, duodenum, small bowel and colon  are grossly normal. Large amount of stool throughout the colon and down into the rectum suggesting constipation. Vascular/Lymphatic: Stable age advanced atherosclerotic calcifications involving the distal aorta and iliac arteries but no aneurysm or dissection. The branch vessels are patent. The major venous structures are patent. New right-sided retroperitoneal lymph nodes with a intra aortocaval lymph node  measuring 9.5 mm on image 62/2. Reproductive: The uterus and ovaries are normal. Other: No pelvic mass or pelvic adenopathy. No free pelvic fluid collections. No inguinal mass or adenopathy. Musculoskeletal: No significant bony findings. IMPRESSION: 1. Stable right lower lobe lung lesion. 2. Right lower lobe and right middle lobe bronchopneumonia. 3. New 5 mm peripheral nodule in the right upper lobe could be inflammatory but metastatic disease is not excluded. Recommend attention on follow-up. 4. Interval development of extensive hepatic metastatic disease. 5. Stable bilateral adrenal gland metastasis. 6. New right-sided retroperitoneal lymph nodes. 7. Interval marked enlargement of the thyroid gland with multiple large nodules. Could not exclude the possibility of metastatic disease although that would be pretty unusual. 8. PET-CT may be helpful for further evaluation, if necessary. 9. Large amount of stool throughout the colon and down into the rectum suggesting constipation. Aortic Atherosclerosis (ICD10-I70.0). Electronically Signed   By: Marijo Sanes M.D.   On: 04/24/2020 11:25     Medications   Scheduled Meds: . docusate sodium  200 mg Oral BID  . fentaNYL  1 patch Transdermal Q72H  . methimazole  5 mg Oral Daily  . polyethylene glycol  17 g Oral Daily  . scopolamine  1 patch Transdermal Q72H  . senna  1 tablet Oral Daily   Continuous Infusions:     LOS: 2 days    Time spent: 32 minutes    Ezekiel Slocumb, DO [facility]  04/27/2020, 7:38 AM    If 7PM-7AM, please  contact night-coverage. How to contact the [facility] Attending or Consulting provider [place] or covering provider during after hours [place], for this patient?    1. Check the care team in [facility] and look for a) attending/consulting [facility] provider listed and b) the [facility] team listed 2. Log into [email or web address] and use [place]'s universal password to access. If you do not have the password, please contact the hospital operator. 3. Locate the [facility] provider you are looking for under [facility] and page to a number that you can be directly reached. 4. If you still have difficulty reaching the provider, please page the [facility] (Director on Call) for the Hospitalists listed on amion for assistance.

## 2020-04-27 NOTE — TOC Transition Note (Signed)
Transition of Care [facility]) - CM/SW Discharge Note   Patient Details  Name: Deborah Nguyen MRN: [id number] Date of Birth: 06-25-1961  Transition of [facility]) CM/SW Contact:  Meriel Flavors, LCSW Phone Number: 04/27/2020, 12:36 PM   Clinical Narrative:    5/23: Pt was accepted by [facility] for inpatient hospice, they will not have a bed available until possibly 5/25 or after. Pt and family have agreed to wait for bed.  5/22: Pt was referred for hospice/palliative consult.            Patient Goals and CMS Choice        Discharge Placement                       Discharge Plan and Services                                     Social Determinants of Health (SDOH) Interventions     Readmission Risk Interventions Readmission Risk Prevention Plan 04/07/2020  Transportation Screening Complete  PCP or Specialist Appt within 3-5 Days Complete  HRI or Home Care Consult Complete  Medication Review (RN Care Manager) Complete  Some recent data might be hidden

## 2020-04-28 ENCOUNTER — Ambulatory Visit: Admission: RE | Admit: 2020-04-28 | Payer: Medicare Other | Source: Ambulatory Visit

## 2020-04-28 DIAGNOSIS — C7931 Secondary malignant neoplasm of brain: Secondary | ICD-10-CM

## 2020-04-28 DIAGNOSIS — Z515 Encounter for palliative care: Secondary | ICD-10-CM

## 2020-04-28 MED ORDER — LORAZEPAM 2 MG/ML IJ SOLN
1.0000 mg | INTRAMUSCULAR | Status: DC | PRN
  Administered 2020-04-29: 2 mg via INTRAVENOUS
  Filled 2020-04-28: qty 1

## 2020-04-28 MED ORDER — HYDROMORPHONE HCL 1 MG/ML IJ SOLN
1.0000 mg | INTRAMUSCULAR | Status: AC
  Administered 2020-04-28: 1 mg via INTRAVENOUS

## 2020-04-28 MED ORDER — GLYCOPYRROLATE 0.2 MG/ML IJ SOLN
0.2000 mg | INTRAMUSCULAR | Status: DC | PRN
  Administered 2020-04-28 – 2020-04-29 (×7): 0.2 mg via INTRAVENOUS
  Filled 2020-04-28 (×8): qty 1

## 2020-04-28 MED ORDER — SODIUM CHLORIDE 0.9 % IV SOLN
0.5000 mg/h | INTRAVENOUS | Status: DC
  Administered 2020-04-28: 0.5 mg/h via INTRAVENOUS
  Filled 2020-04-28: qty 5

## 2020-04-28 NOTE — Progress Notes (Signed)
Hematology/Oncology Consult note [facility]  Telephone:([phone] [phone]  Patient Care Team: Tracie Harrier, MD as PCP - General (Internal Medicine) Telford Nab, RN as Registered Nurse Sindy Guadeloupe, MD as Consulting Physician (Hematology and Oncology)   Name of the patient: Deborah Nguyen  [id number]  04-27-1961    Interval history- patient is on comfort care measures and actively dying. Appears comfortable    Allergies  Allergen Reactions  . Morphine And Related Anaphylaxis  . Paxil [Paroxetine Hcl] Other (See Comments)    Hallucinations   . Ciprofloxacin Other (See Comments)    Unknown  . Levaquin [Levofloxacin] Other (See Comments)    Unknown   . 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 She has an increase risk of malignant hyperthermia with anesthesia. She should not receive Propofol.   Donne Hazel [Nefazodone] Other (See Comments)    Unknown   . Biaxin [Clarithromycin] Nausea Only  . Erythromycin Nausea Only  . Penicillins Rash     Past Medical History:  Diagnosis Date  . Anxiety   . Arthritis   . Chronic kidney disease   . Family history of breast cancer    aunt  . GERD (gastroesophageal reflux disease)   . History of DVT of lower extremity    Left leg  . Lung cancer ([place])    with brain mets  . Lung mass   . Malignant hyperthermia   . 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 Muscular dystrophy ([place])   . Muscular dystrophy [facility])      Past Surgical History:  Procedure Laterality Date  . CESAREAN SECTION  11/1985  . COLONOSCOPY N/A 10/27/2015   Procedure: COLONOSCOPY;  Surgeon: Manya Silvas, MD;  Location: [facility] ENDOSCOPY;  Service: Endoscopy;  Laterality: N/A;   NO Propofol - per office  . LITHOTRIPSY    . PORTA CATH INSERTION N/A 11/09/2019   Procedure: PORTA CATH INSERTION;  Surgeon: Algernon Huxley, MD;  Location: [place] CV LAB;  Service: Cardiovascular;  Laterality: N/A;  . PORTA CATH INSERTION N/A 01/23/2020   Procedure: PORTA CATH INSERTION;  Surgeon: Algernon Huxley, MD;  Location: [place] [facility] LAB;  Service: Cardiovascular;  Laterality: N/A;  . PORTA CATH INSERTION N/A 02/01/2020   Procedure: PORTA CATH INSERTION;  Surgeon: Algernon Huxley, MD;  Location: [place] [facility] LAB;  Service: Cardiovascular;  Laterality: N/A;  . TUBAL LIGATION  03/1986    Social History   Socioeconomic History  . Marital status: Divorced    Spouse name: Not on file  . Number of children: Not on file  . Years of education: Not on file  . Highest education level: Not on file  Occupational History  . Not on file  Tobacco Use  . Smoking status: Former Smoker    Packs/day: 1.00    Types: Cigarettes    Quit date: 05/2016    Years since quitting: 3.9  . Smokeless tobacco: Never Used  Substance and Sexual Activity  . Alcohol use: No  . Drug use: No  . Sexual activity: Not Currently  Other Topics Concern  . Not on file  Social History Narrative  . Not on file   Social Determinants of Health   Financial Resource Strain:   . Difficulty of Paying Living Expenses:   Food Insecurity:   .  Worried About Charity fundraiser in the Last Year:   . Arboriculturist in the Last Year:   Transportation Needs:   . Film/video editor (Medical):   Marland Kitchen Lack of Transportation (Non-Medical):   Physical Activity:   . Days of Exercise per Week:   . Minutes of Exercise per Session:   Stress:   . Feeling of Stress :   Social Connections:   . Frequency of Communication with Friends and Family:   . Frequency of Social Gatherings with Friends and Family:   . Attends Religious Services:   . Active Member of Clubs or Organizations:   . Attends Archivist Meetings:   Marland Kitchen Marital Status:   Intimate Partner Violence:   . Fear of Current or Ex-Partner:   . Emotionally Abused:   Marland Kitchen Physically Abused:    . Sexually Abused:     Family History  Problem Relation Age of Onset  . Cancer Father        bladder, lung  . Breast cancer Maternal Aunt 45     Current Facility-Administered Medications:  .  acetaminophen (TYLENOL) suppository 325 mg, 325 mg, Rectal, Q4H PRN, Nicole Kindred A, DO .  atropine 1 % ophthalmic solution 2 drop, 2 drop, Sublingual, QID PRN, Nicole Kindred A, DO, 2 drop at 04/26/20 1255 .  bisacodyl (DULCOLAX) suppository 10 mg, 10 mg, Rectal, Daily PRN, Nicole Kindred A, DO .  glycopyrrolate (ROBINUL) injection 0.2 mg, 0.2 mg, Intravenous, Q2H PRN, Sharion Settler, NP, 0.2 mg at 04/28/20 1232 .  HYDROmorphone (DILAUDID) 50 mg in sodium chloride 0.9 % 100 mL (0.5 mg/mL) infusion, 0.5 mg/hr, Intravenous, Continuous, Borders, Joshua R, NP, Last Rate: 1 mL/hr at 04/28/20 1129, 0.5 mg/hr at 04/28/20 1129 .  HYDROmorphone (DILAUDID) injection 1 mg, 1 mg, Intravenous, Q1H PRN, Nicole Kindred A, DO, 1 mg at 04/28/20 3818 .  LORazepam (ATIVAN) injection 1-2 mg, 1-2 mg, Intravenous, Q4H PRN, Borders, Kirt Boys, NP .  ondansetron (ZOFRAN) injection 4 mg, 4 mg, Intravenous, Q8H PRN, Ivor Costa, MD .  scopolamine (TRANSDERM-SCOP) 1 MG/3DAYS 1.5 mg, 1 patch, Transdermal, Q72H, Nicole Kindred A, DO, 1.5 mg at 04/26/20 1255  Physical exam:  Vitals:   04/27/20 2351 04/27/20 2352 04/28/20 0352 04/28/20 0356  BP:    94/62  Pulse: 97 97 98 98  Resp: 12 12 13 13   Temp:    98 F (36.7 C)  TempSrc:    Oral  SpO2: (!) 89% 91% (!) 89% 95%  Weight:      Height:        CMP Latest Ref Rng & Units 04/26/2020  Glucose 70 - 99 mg/dL 87  BUN 6 - 20 mg/dL 12  Creatinine 0.44 - 1.00 mg/dL <0.30(L)  Sodium 135 - 145 mmol/L 134(L)  Potassium 3.5 - 5.1 mmol/L 4.7  Chloride 98 - 111 mmol/L 98  CO2 22 - 32 mmol/L 29  Calcium 8.9 - 10.3 mg/dL 11.1(H)  Total Protein 6.5 - 8.1 g/dL -  Total Bilirubin 0.3 - 1.2 mg/dL -  Alkaline Phos 38 - 126 U/L -  AST 15 - 41 U/L -  ALT 0 - 44 U/L -    CBC Latest Ref Rng & Units 04/26/2020  WBC 4.0 - 10.5 K/uL 17.2(H)  Hemoglobin 12.0 - 15.0 g/dL 9.3(L)  Hematocrit 36.0 - 46.0 % 30.1(L)  Platelets 150 - 400 K/uL 246    @IMAGES @  CT Head Wo Contrast  Result Date: 04/06/2020 CLINICAL DATA:  Weakness. EXAM: CT HEAD WITHOUT CONTRAST TECHNIQUE: Contiguous axial images were obtained from the base of the skull through the vertex without intravenous contrast. COMPARISON:  None. FINDINGS: Brain: There is mild cerebral atrophy with widening of the extra-axial spaces and ventricular dilatation. There are areas of decreased attenuation within the white matter tracts of the supratentorial brain, consistent with microvascular disease changes. Vascular: No hyperdense vessel or unexpected calcification. Skull: Normal. Negative for fracture or focal lesion. Sinuses/Orbits: No acute finding. Other: None. IMPRESSION: 1. No acute intracranial abnormality. 2. Mild cerebral atrophy and microvascular disease changes of the supratentorial brain. Electronically Signed   By: Virgina Norfolk M.D.   On: 04/06/2020 01:12   CT CHEST W CONTRAST  Result Date: 04/11/2020 CLINICAL DATA:  Altered mental status and right upper quadrant abdominal pain. History of stage IV non-small cell lung cancer. EXAM: CT CHEST, ABDOMEN, AND PELVIS WITH CONTRAST TECHNIQUE: Multidetector CT imaging of the chest, abdomen and pelvis was performed following the standard protocol during bolus administration of intravenous contrast. CONTRAST:  16mL OMNIPAQUE IOHEXOL 300 MG/ML  SOLN COMPARISON:  01/28/2020 FINDINGS: CT CHEST FINDINGS Cardiovascular: The heart is normal in size. No pericardial effusion. The aorta is normal in caliber. No dissection. Stable minimal calcifications. The branch vessels are patent. No definite coronary artery calcifications. Mediastinum/Nodes: Slightly progressive soft tissue density noted in the subcarinal region with maximum measurement of 9 mm. This previously measured  6.5 mm. I do not see any other enlarged mediastinal or hilar lymph nodes. The esophagus is grossly normal. Lungs/Pleura: The right lower lobe lesion is grossly stable. It is a little hard to measure because of new eventration/elevation of the right hemidiaphragm. Right lower lobe and right middle lobe peribronchial thickening and patchy airspace opacity the suggesting bronchopneumonia. A few scattered tiny pulmonary nodules are noted some of which are calcified granulomas. New 5 mm peripheral nodule in the right upper lobe on image number 78/4 could be inflammatory but metastatic disease is not excluded. Subpleural density in the left lower lobe on image 77/4 measures 9 mm and is likely inflammatory or focus of atelectasis. Musculoskeletal: Interval marked enlargement of the thyroid gland multiple large nodules. I am not sure hydro explain this rapid progression. Could not exclude the possibility of metastatic disease although that would be pretty unusual. No axillary or supraclavicular adenopathy. No breast masses. The bony structures are intact. No worrisome bone lesions. CT ABDOMEN PELVIS FINDINGS Hepatobiliary: Interval development of extensive hepatic metastatic disease. I do not see any lesions even in retrospect on the prior study from February 22nd. This would suggest very aggressive metastatic disease. 4.6 cm lesion in segment 4A. Coalescent lesions in the right hepatic lobe in and around the caudate lobe measures a maximum of 7.7 cm. 2.7 cm lesion in segment 6 on image 53/2. 2.5 cm segment 6 lesion on image 59/2. The gallbladder is contracted. No intra or extrahepatic biliary dilatation. Pancreas: No mass, inflammation or ductal dilatation. Spleen: Normal size. No focal lesions. Adrenals/Urinary Tract: Bilateral adrenal gland metastasis appear relatively stable. The the kidneys are unremarkable and stable. Stable large right renal cyst. The bladder appears normal. Stomach/Bowel: The stomach, duodenum,  small bowel and colon are grossly normal. Large amount of stool throughout the colon and down into the rectum suggesting constipation. Vascular/Lymphatic: Stable age advanced atherosclerotic calcifications involving the distal aorta and iliac arteries but no aneurysm or dissection. The branch vessels are patent. The major venous structures are patent. New right-sided retroperitoneal lymph nodes with a intra  aortocaval lymph node measuring 9.5 mm on image 62/2. Reproductive: The uterus and ovaries are normal. Other: No pelvic mass or pelvic adenopathy. No free pelvic fluid collections. No inguinal mass or adenopathy. Musculoskeletal: No significant bony findings. IMPRESSION: 1. Stable right lower lobe lung lesion. 2. Right lower lobe and right middle lobe bronchopneumonia. 3. New 5 mm peripheral nodule in the right upper lobe could be inflammatory but metastatic disease is not excluded. Recommend attention on follow-up. 4. Interval development of extensive hepatic metastatic disease. 5. Stable bilateral adrenal gland metastasis. 6. New right-sided retroperitoneal lymph nodes. 7. Interval marked enlargement of the thyroid gland with multiple large nodules. Could not exclude the possibility of metastatic disease although that would be pretty unusual. 8. PET-CT may be helpful for further evaluation, if necessary. 9. Large amount of stool throughout the colon and down into the rectum suggesting constipation. Aortic Atherosclerosis (ICD10-I70.0). Electronically Signed   By: Marijo Sanes M.D.   On: 04/15/2020 11:25   CT ABDOMEN PELVIS W CONTRAST  Result Date: 04/16/2020 CLINICAL DATA:  Altered mental status and right upper quadrant abdominal pain. History of stage IV non-small cell lung cancer. EXAM: CT CHEST, ABDOMEN, AND PELVIS WITH CONTRAST TECHNIQUE: Multidetector CT imaging of the chest, abdomen and pelvis was performed following the standard protocol during bolus administration of intravenous contrast. CONTRAST:   128mL OMNIPAQUE IOHEXOL 300 MG/ML  SOLN COMPARISON:  01/28/2020 FINDINGS: CT CHEST FINDINGS Cardiovascular: The heart is normal in size. No pericardial effusion. The aorta is normal in caliber. No dissection. Stable minimal calcifications. The branch vessels are patent. No definite coronary artery calcifications. Mediastinum/Nodes: Slightly progressive soft tissue density noted in the subcarinal region with maximum measurement of 9 mm. This previously measured 6.5 mm. I do not see any other enlarged mediastinal or hilar lymph nodes. The esophagus is grossly normal. Lungs/Pleura: The right lower lobe lesion is grossly stable. It is a little hard to measure because of new eventration/elevation of the right hemidiaphragm. Right lower lobe and right middle lobe peribronchial thickening and patchy airspace opacity the suggesting bronchopneumonia. A few scattered tiny pulmonary nodules are noted some of which are calcified granulomas. New 5 mm peripheral nodule in the right upper lobe on image number 78/4 could be inflammatory but metastatic disease is not excluded. Subpleural density in the left lower lobe on image 77/4 measures 9 mm and is likely inflammatory or focus of atelectasis. Musculoskeletal: Interval marked enlargement of the thyroid gland multiple large nodules. I am not sure hydro explain this rapid progression. Could not exclude the possibility of metastatic disease although that would be pretty unusual. No axillary or supraclavicular adenopathy. No breast masses. The bony structures are intact. No worrisome bone lesions. CT ABDOMEN PELVIS FINDINGS Hepatobiliary: Interval development of extensive hepatic metastatic disease. I do not see any lesions even in retrospect on the prior study from February 22nd. This would suggest very aggressive metastatic disease. 4.6 cm lesion in segment 4A. Coalescent lesions in the right hepatic lobe in and around the caudate lobe measures a maximum of 7.7 cm. 2.7 cm lesion  in segment 6 on image 53/2. 2.5 cm segment 6 lesion on image 59/2. The gallbladder is contracted. No intra or extrahepatic biliary dilatation. Pancreas: No mass, inflammation or ductal dilatation. Spleen: Normal size. No focal lesions. Adrenals/Urinary Tract: Bilateral adrenal gland metastasis appear relatively stable. The the kidneys are unremarkable and stable. Stable large right renal cyst. The bladder appears normal. Stomach/Bowel: The stomach, duodenum, small bowel and colon are grossly  normal. Large amount of stool throughout the colon and down into the rectum suggesting constipation. Vascular/Lymphatic: Stable age advanced atherosclerotic calcifications involving the distal aorta and iliac arteries but no aneurysm or dissection. The branch vessels are patent. The major venous structures are patent. New right-sided retroperitoneal lymph nodes with a intra aortocaval lymph node measuring 9.5 mm on image 62/2. Reproductive: The uterus and ovaries are normal. Other: No pelvic mass or pelvic adenopathy. No free pelvic fluid collections. No inguinal mass or adenopathy. Musculoskeletal: No significant bony findings. IMPRESSION: 1. Stable right lower lobe lung lesion. 2. Right lower lobe and right middle lobe bronchopneumonia. 3. New 5 mm peripheral nodule in the right upper lobe could be inflammatory but metastatic disease is not excluded. Recommend attention on follow-up. 4. Interval development of extensive hepatic metastatic disease. 5. Stable bilateral adrenal gland metastasis. 6. New right-sided retroperitoneal lymph nodes. 7. Interval marked enlargement of the thyroid gland with multiple large nodules. Could not exclude the possibility of metastatic disease although that would be pretty unusual. 8. PET-CT may be helpful for further evaluation, if necessary. 9. Large amount of stool throughout the colon and down into the rectum suggesting constipation. Aortic Atherosclerosis (ICD10-I70.0). Electronically Signed    By: Marijo Sanes M.D.   On: 05/05/2020 11:25   DG Chest Portable 1 View  Result Date: 04/05/2020 CLINICAL DATA:  Weakness. History of lung cancer. Progressive weakness. EXAM: PORTABLE CHEST 1 VIEW COMPARISON:  Radiograph 12/27/2019. CT 01/28/2020 FINDINGS: Tip of the right chest port in the SVC. Mild elevation of right hemidiaphragm with streaky opacity at the right lung base, corresponding to nodule on CT. Minor streaky left lung base atelectasis. The heart is normal in size. Normal mediastinal contours. No pleural fluid or pneumothorax. No acute osseous abnormalities are seen. IMPRESSION: 1. Mild elevation of the right hemidiaphragm with streaky opacity at the right lung base, corresponding to nodule on CT. 2. Minor streaky left lung base atelectasis. Electronically Signed   By: Keith Rake M.D.   On: 04/05/2020 23:55   US Abdomen Limited RUQ  Result Date: 04/06/2020 CLINICAL DATA:  Right upper quadrant pain and nausea. EXAM: ULTRASOUND ABDOMEN LIMITED RIGHT UPPER QUADRANT COMPARISON:  CT 01/28/2020 FINDINGS: Gallbladder: No gallstones or wall thickening visualized. No sonographic Murphy sign noted by sonographer. Common bile duct: Diameter: 8 mm unchanged. Liver: Mild increased parenchymal echogenicity centrally suggesting steatosis. No focal mass. Portal vein is patent on color Doppler imaging with normal direction of blood flow towards the liver. Other: Incidental right renal cyst unchanged. IMPRESSION: No acute findings. Electronically Signed   By: Marin Olp M.D.   On: 04/06/2020 16:24     Assessment and plan- Patient is a 59 y.o. 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  Her prognosis is likely hours- days at this time. She will remain inpatient for the duration and will not be transitioned  to hospice home given the rapidity of her decline. She had a chance to meet and speak to her family members over the weekend. She is currently on dilaudid drip and appears comfortable.   Visit Diagnosis 1. Community acquired pneumonia of right lower lobe of lung   2. Malignant neoplasm of lung, unspecified laterality, unspecified part of lung ([place])   3. Right flank pain   4. Lung cancer metastatic to brain [facility])  5. Constipation, unspecified constipation type   6. Palliative care encounter   7. Metastatic squamous cell carcinoma to lung, right ([place])      Dr. Randa Evens, MD, MPH [facility] at [facility] [phone] 04/28/2020 1:15 PM

## 2020-04-28 NOTE — Care Management Important Message (Signed)
Important Message  Patient Details  Name: Deborah Nguyen MRN: [id number] Date of Birth: Feb 01, 1961   Medicare Important Message Given:  Yes     Dannette Barbara 04/28/2020, 12:13 PM

## 2020-04-28 NOTE — Progress Notes (Signed)
PROGRESS NOTE    Deborah Nguyen   YIR:[id number]  DOB: August 06, 1961  PCP: Tracie Harrier, MD    DOA: 04/16/2020 LOS: 3   Brief Narrative   Deborah Nguyen is a 59 y.o. 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  In the ED, low grade fever 99.9 F, borderline BP 94/66, hypoxic with O2 sat 85% on room air which resolved after short use of nonrebreather mask, later 97% on room air.  Labs notable for leukocytosis 21.3k, INR 1.5, lactic acid 1.3, negative COVID-19, negative UA.   CT's of chest/abdomen/pelvis were obtained, showed right middle lobe bronchopneumonia and several new metastatic lesions in the liver, right upper lobe, bilateral adrenals, thyroid and new retroperitoneal lymphadenopathy. Admitted to hospitalist service with oncology consulted.    Palliative care met with patient and family upon admission, and plan is for patient to go to hospice house once a bed is available.  In the meantime, comfort care meaures have been initiated in the hospital after speak with patient's son who decided against continuing antibiotics for pneumonia pending hospice bed, as patient appears to be declining quite rapidly.    5/24: anticipate in hospital death, patient actively in dying process.  Dilaudid infusion started this AM.  Several family members at bedside.    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 Expect imminent passing. --comfort care per orders --notify attending or hospice if any signs of discomfort/pain/distress   Problems this admission no longer active:  Sepsis due to HCAP - present on admission with leukocytosis, tachycardia, and RML pneumonia.  Normal lactic acid.  Antibiotics stopped per family request to pursue full comfort measures  Metastatic squamous cell carcinoma of the right lung - now with widespread, progressive development of new metastatic lesions as outlined above.   --oncology and palliative care following --PO meds for chronic conditions stopped as patient now aspirating with swallow  Port-A-Cath in place - no acute issues. Monitor.   Hypercalcemia RUQ pain Depression with anxiety  GERD (gastroesophageal reflux disease) History of DVT of lower extremity  Subclinical hyperthyroidism Generalized weakness - due to malignancy and chemotherapy   DVT prophylaxis: none (comfort care status)  Diet:  Diet Orders (From admission, onward)    Start     Ordered   04/18/2020 1334  Diet regular Room service appropriate? Yes; Fluid consistency: Thin  Diet effective now    Question Answer Comment  Room service appropriate? Yes   Fluid consistency: Thin      04/06/2020 1334            Code Status: DNR    Subjective 04/28/20    Patient seen with several family members present this AM.  Dilaudid infusion started for better  comfort.  Patient appears resting comfortably.  Currently having minimal audible secretion sounds.  Disposition Plan & Communication   Status is: Inpatient  Remains inpatient appropriate because: she is full comfort care, actively dying.  Anticipate in hospital death.   Dispo: The patient is from: Home              Anticipated d/c is to:  funeral home              Anticipated d/c date is: hours to 1 day              Patient currently is not medically stable to d/c.   Family Communication: several family members including sister and niece were at bedside during encounter.    Consults, Procedures, Significant Events   Consultants:   Oncology  Palliative Care  Hospice  Procedures:   none  Antimicrobials:   Vanc & Cefepime 5/21 >> 5/22     Objective   Vitals:   04/27/20 2351 04/27/20 2352 04/28/20 0352 04/28/20 0356  BP:    94/62  Pulse: 97 97 98 98  Resp: '12 12 13 13  ' Temp:    98 F (36.7 C)  TempSrc:    Oral  SpO2: (!) 89% 91% (!) 89% 95%  Weight:      Height:        Intake/Output Summary (Last 24 hours) at 04/28/2020 1552 Last data filed at 04/28/2020 1341 Gross per 24 hour  Intake 240 ml  Output 450 ml  Net -210 ml   Filed Weights   04/13/2020 0910  Weight: 60.8 kg    Physical Exam:  General exam: sleeping, no acute distress Respiratory system: diffuse rhonchi, increased respiratory effort with accessory muscle use. Cardiovascular system: normal S1/S2, RRR, no pedal edema.   Skin: dry, intact, normal temperature and color  Labs   Data Reviewed: I have personally reviewed following labs and imaging studies  CBC: Recent Labs  Lab 05/05/2020 0924 04/26/20 0425  WBC 21.3* 17.2*  NEUTROABS 19.5*  --   HGB 11.4* 9.3*  HCT 35.7* 30.1*  MCV 95.2 96.2  PLT 279 615   Basic Metabolic Panel: Recent Labs  Lab 04/20/2020 0924 04/26/20 0425  NA 135 134*  K 5.0 4.7  CL 97* 98  CO2 28 29  GLUCOSE 112* 87  BUN 25* 12  CREATININE 0.37* <0.30*  CALCIUM 11.0* 11.1*   GFR: CrCl cannot be calculated (This lab value cannot be used to calculate CrCl because it is not a number: <0.30). Liver Function Tests: Recent Labs  Lab 04/16/2020 0924  AST 34  ALT 35  ALKPHOS 151*  BILITOT 0.9  PROT 7.7  ALBUMIN 2.7*   No results for input(s): LIPASE, AMYLASE in the last 168 hours. No results  for input(s): AMMONIA in the last 168 hours. Coagulation Profile: Recent Labs  Lab 04/17/2020 0924  INR 1.5*   Cardiac Enzymes: No results for input(s): CKTOTAL, CKMB, CKMBINDEX, TROPONINI in the last 168 hours. BNP (last 3 results) No results for input(s): PROBNP in the last 8760 hours. HbA1C: No results for input(s): HGBA1C in the last 72 hours. CBG: No results for input(s): GLUCAP in the last 168 hours. Lipid Profile: No results for input(s): CHOL, HDL, LDLCALC, TRIG, CHOLHDL, LDLDIRECT in the last 72 hours. Thyroid Function Tests: No results for input(s): TSH, T4TOTAL, FREET4, T3FREE, THYROIDAB in the last 72 hours. Anemia Panel: No results for input(s): VITAMINB12, FOLATE, FERRITIN, TIBC, IRON, RETICCTPCT in the last 72 hours.  Sepsis Labs: Recent Labs  Lab 04/14/2020 0913 04/23/2020 0924  PROCALCITON  --  0.58  LATICACIDVEN 1.3  --     Recent Results (from the past 240 hour(s))  Culture, blood (Routine x 2)     Status: None (Preliminary result)   Collection Time: 04/09/2020  9:24 AM   Specimen: BLOOD  Result Value Ref Range Status   Specimen Description BLOOD LAC  Final   Special Requests   Final    BOTTLES DRAWN AEROBIC AND ANAEROBIC Blood Culture adequate volume   Culture   Final    NO GROWTH 3 DAYS Performed at [facility], [address], [place], [place] [address]    Report Status PENDING  Incomplete  Urine culture     Status: None   Collection Time: 05/02/2020  9:24 AM   Specimen: In/Out Cath Urine  Result Value Ref Range Status   Specimen Description   Final    IN/OUT CATH URINE Performed at [facility], [address] [place], [place] [address]    Special Requests   Final    NONE Performed at [facility], [address], [place], [place] [address]    Culture   Final    NO GROWTH Performed at [facility], [place] [address], [place], [place] [address]    Report Status 04/26/2020 FINAL  Final  Culture, blood (Routine x  2)     Status: None (Preliminary result)   Collection Time: 04/23/2020  9:47 AM   Specimen: BLOOD  Result Value Ref Range Status   Specimen Description BLOOD RIGHT AC  Final   Special Requests   Final    BOTTLES DRAWN AEROBIC AND ANAEROBIC Blood Culture results may not be optimal due to an excessive volume of blood received in culture bottles   Culture   Final    NO GROWTH 3 DAYS Performed at [facility], [address] [place], [place] [address]    Report Status PENDING  Incomplete  SARS Coronavirus 2 by RT PCR (hospital order, performed in [place] hospital lab) Nasopharyngeal Nasopharyngeal Swab     Status: None   Collection Time: 04/30/2020 12:09 PM   Specimen: Nasopharyngeal Swab  Result Value Ref Range Status   SARS Coronavirus 2 NEGATIVE NEGATIVE Final    Comment: (NOTE) SARS-CoV-2 target nucleic acids are NOT DETECTED. The SARS-CoV-2 RNA is generally detectable in upper and lower respiratory specimens during the acute phase of infection. The lowest concentration of SARS-CoV-2 viral copies this assay can detect is 250 copies / mL. A negative result does not preclude SARS-CoV-2 infection and should not be used as the sole basis for treatment or other patient management decisions.  A negative result may occur with improper specimen collection / handling, submission of specimen other than nasopharyngeal swab, presence of viral mutation(s) within the areas targeted by this assay, and inadequate number of viral copies (<250 copies / mL). A negative result must be combined with clinical observations, patient history, and epidemiological information. Fact Sheet for Patients:   StrictlyIdeas.no Fact Sheet for Healthcare Providers: BankingDealers.co.za This test is not yet approved or cleared  by the [place] FDA and has been authorized for detection and/or diagnosis of SARS-CoV-2 by FDA under an Emergency Use  Authorization (EUA).  This EUA will remain in effect (meaning this test can be used) for the duration of the COVID-19 declaration under Section 564(b)(1) of the Act, 21 U.S.C. section 360bbb-3(b)(1), unless the authorization is terminated or  revoked sooner. Performed at [facility], [address], [place], [place] [address]       Imaging Studies   No results found.   Medications   Scheduled Meds: . scopolamine  1 patch Transdermal Q72H   Continuous Infusions: . HYDROmorphone 0.5 mg/hr (04/28/20 1129)       LOS: 3 days    Time spent: 32 minutes    Ezekiel Slocumb, DO [facility]  04/28/2020, 3:52 PM    If 7PM-7AM, please contact night-coverage. How to contact the [facility] Attending or Consulting provider [place] or covering provider during after hours [place], for this patient?    1. Check the care team in [facility] and look for a) attending/consulting [facility] provider listed and b) the [facility]) team listed 2. Log into [email or web address] and use [place]'s universal password to access. If you do not have the password, please contact the hospital operator. 3. Locate the [facility] provider you are looking for under [facility] and page to a number that you can be directly reached. 4. If you still have difficulty reaching the provider, please page the [facility] (Director on Call) for the Hospitalists listed on amion for assistance.

## 2020-04-28 NOTE — Progress Notes (Signed)
[place]  Telephone:([phone] [phone]   Name: Deborah Nguyen Date: 04/28/2020 MRN: [id number]  DOB: [birth year]  Patient Care Team: Tracie Harrier, MD as PCP - General (Internal Medicine) Telford Nab, RN as Registered Nurse Sindy Guadeloupe, MD as Consulting Physician (Hematology and Oncology)    REASON FOR CONSULTATION: Deborah Nguyen is a 59 y.o. 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 PMH is also notable for muscular dystrophy followed by the neuromuscular clinic at [facility], anxiety, history of hypothyroidism, recurrent hyperthermia, and history of lower extremity DVT. Patient is status post whole brain radiation and chemotherapy and is most recently treated with pembrolizumab.  Patient was recently hospitalized 04/05/2020 to 04/10/2020 with fever of unknown origin.  She was subsequently discharged to rehab.  Patient was discharged home from rehab earlier this week and presented to the ER on 05/01/2020 with weakness and shortness of breath.  She was found to have HCAP.  CTs of chest, abdomen, and pelvis revealed progressive metastatic disease with significant tumor involvement of the liver and thyroid.  Palliative care was consulted help address goals.    CODE STATUS: DNR  PAST MEDICAL HISTORY: Past Medical History:  Diagnosis Date  . Anxiety   . Arthritis   . Chronic kidney disease   . Family history of breast cancer    aunt  . GERD (gastroesophageal reflux disease)   . History of DVT of lower extremity    Left leg  . Lung cancer ([place])    with brain mets  . Lung mass   . Malignant hyperthermia   . 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[place])    mild form - per patient central cord disease  . Muscular dystrophy ([place])   . Muscular dystrophy ([place])     PAST SURGICAL HISTORY:  Past Surgical History:  Procedure Laterality Date  . CESAREAN SECTION   11/1985  . COLONOSCOPY N/A 10/27/2015   Procedure: COLONOSCOPY;  Surgeon: Manya Silvas, MD;  Location: [facility] ENDOSCOPY;  Service: Endoscopy;  Laterality: N/A;   NO Propofol - per office  . LITHOTRIPSY    . PORTA CATH INSERTION N/A 11/09/2019   Procedure: PORTA CATH INSERTION;  Surgeon: Algernon Huxley, MD;  Location: [place] [facility];  Service: Cardiovascular;  Laterality: N/A;  . PORTA CATH INSERTION N/A 01/23/2020   Procedure: PORTA CATH INSERTION;  Surgeon: Algernon Huxley, MD;  Location: [place] [facility];  Service: Cardiovascular;  Laterality: N/A;  . PORTA CATH INSERTION N/A 02/01/2020   Procedure: PORTA CATH INSERTION;  Surgeon: Algernon Huxley, MD;  Location: [place] [facility];  Service: Cardiovascular;  Laterality: N/A;  . TUBAL LIGATION  03/1986    HEMATOLOGY/ONCOLOGY HISTORY:  Oncology History  Malignant neoplasm of lower lobe of right lung ([place])  09/20/2019 Initial Diagnosis   Malignant neoplasm of lower lobe of right lung ([place])   10/05/2019 Cancer Staging   Staging form: Lung, AJCC 8th Edition - Clinical stage from 10/05/2019: Stage IV (cT2, cN2, cM1) - Signed by Sindy Guadeloupe, MD on 10/08/2019   11/12/2019 -  Chemotherapy   The patient had palonosetron (ALOXI) injection 0.25 mg, 0.25 mg, Intravenous,  Once, 4 of 4 cycles Administration: 0.25 mg (11/12/2019), 0.25 mg (12/24/2019), 0.25 mg (01/14/2020), 0.25 mg (12/03/2019) pegfilgrastim-cbqv (UDENYCA) injection 6 mg, 6 mg, Subcutaneous, Once, 4 of 4 cycles Administration: 6 mg (11/13/2019), 6 mg (12/26/2019), 6 mg (01/15/2020),  6 mg (12/04/2019) CARBOplatin (PARAPLATIN) 500 mg in sodium chloride 0.9 % 250 mL chemo infusion, 500 mg (100 % of original dose 500 mg), Intravenous,  Once, 4 of 4 cycles Dose modification:   (original dose 487.5 mg, Cycle 1), 500 mg (original dose 500 mg, Cycle 1, Reason: Patient Age, Comment: per md) Administration: 500 mg (12/24/2019), 500 mg (01/14/2020), 500 mg (12/03/2019), 500 mg (11/12/2019) PACLitaxel  (TAXOL) 324 mg in sodium chloride 0.9 % 500 mL chemo infusion (> 80mg /m2), 200 mg/m2 = 324 mg, Intravenous,  Once, 4 of 4 cycles Dose modification: 175 mg/m2 (original dose 200 mg/m2, Cycle 3, Reason: Other (see comments), Comment: neuropathy) Administration: 324 mg (11/12/2019), 282 mg (12/24/2019), 282 mg (01/14/2020), 324 mg (12/03/2019) pembrolizumab (KEYTRUDA) 200 mg in sodium chloride 0.9 % 50 mL chemo infusion, 200 mg, Intravenous, Once, 6 of 10 cycles Administration: 200 mg (11/12/2019), 200 mg (12/24/2019), 200 mg (01/14/2020), 200 mg (02/04/2020), 200 mg (02/26/2020), 200 mg (03/18/2020) fosaprepitant (EMEND) 150 mg, dexamethasone (DECADRON) 12 mg in sodium chloride 0.9 % 145 mL IVPB, , Intravenous,  Once, 4 of 4 cycles Administration:  (11/12/2019),  (12/24/2019),  (01/14/2020),  (12/03/2019)  for chemotherapy treatment.      ALLERGIES:  is allergic to morphine and related; paxil [paroxetine hcl]; ciprofloxacin; levaquin [levofloxacin]; propofol; serzone [nefazodone]; biaxin [clarithromycin]; erythromycin; and penicillins.  MEDICATIONS:  Current Facility-Administered Medications  Medication Dose Route Frequency Provider Last Rate Last Admin  . acetaminophen (TYLENOL) suppository 325 mg  325 mg Rectal Q4H PRN Nicole Kindred A, DO      . atropine 1 % ophthalmic solution 2 drop  2 drop Sublingual QID PRN Nicole Kindred A, DO   2 drop at 04/26/20 1255  . bisacodyl (DULCOLAX) suppository 10 mg  10 mg Rectal Daily PRN Nicole Kindred A, DO      . fentaNYL (DURAGESIC) 25 MCG/HR 1 patch  1 patch Transdermal Q72H Cyrilla Durkin, Kirt Boys, NP      . glycopyrrolate (ROBINUL) injection 0.2 mg  0.2 mg Intravenous Q2H PRN Sharion Settler, NP   0.2 mg at 04/28/20 9381  . HYDROmorphone (DILAUDID) 50 mg in sodium chloride 0.9 % 100 mL (0.5 mg/mL) infusion  0.5 mg/hr Intravenous Continuous Alahna Dunne, Vonna Kotyk R, NP      . HYDROmorphone (DILAUDID) injection 1 mg  1 mg Intravenous Q1H PRN Nicole Kindred A, DO   1 mg at  04/28/20 8299  . LORazepam (ATIVAN) injection 1-2 mg  1-2 mg Intravenous Q4H PRN Rico Massar, Kirt Boys, NP      . ondansetron (ZOFRAN) injection 4 mg  4 mg Intravenous Q8H PRN Ivor Costa, MD      . scopolamine (TRANSDERM-SCOP) 1 MG/3DAYS 1.5 mg  1 patch Transdermal Q72H Nicole Kindred A, DO   1.5 mg at 04/26/20 1255    VITAL SIGNS: BP 94/62 (BP Location: Left Arm)   Pulse 98   Temp 98 F (36.7 C) (Oral)   Resp 13   Ht 5\' 1"  (1.549 m)   Wt 134 lb (60.8 kg)   LMP 12/15/2010 (Approximate)   SpO2 95%   BMI 25.32 kg/m  Filed Weights   04/24/2020 0910  Weight: 134 lb (60.8 kg)    Estimated body mass index is 25.32 kg/m as calculated from the following:   Height as of this encounter: 5\' 1"  (1.549 m).   Weight as of this encounter: 134 lb (60.8 kg).  LABS: CBC:    Component Value Date/Time   WBC 17.2 (H) 04/26/2020 0425  HGB 9.3 (L) 04/26/2020 0425   HGB 13.5 11/07/2013 2128   HCT 30.1 (L) 04/26/2020 0425   HCT 39.2 11/07/2013 2128   PLT 246 04/26/2020 0425   PLT 282 11/07/2013 2128   MCV 96.2 04/26/2020 0425   MCV 96 11/07/2013 2128   NEUTROABS 19.5 (H) 04/17/2020 0924   NEUTROABS 6.8 (H) 05/24/2013 1340   LYMPHSABS 0.6 (L) 04/08/2020 0924   LYMPHSABS 1.6 05/24/2013 1340   MONOABS 0.9 04/22/2020 0924   MONOABS 0.6 05/24/2013 1340   EOSABS 0.1 04/17/2020 0924   EOSABS 0.0 05/24/2013 1340   BASOSABS 0.1 04/28/2020 0924   BASOSABS 0.0 05/24/2013 1340   Comprehensive Metabolic Panel:    Component Value Date/Time   NA 134 (L) 04/26/2020 0425   NA 140 11/07/2013 2128   K 4.7 04/26/2020 0425   K 4.2 11/07/2013 2128   CL 98 04/26/2020 0425   CL 107 11/07/2013 2128   CO2 29 04/26/2020 0425   CO2 29 11/07/2013 2128   BUN 12 04/26/2020 0425   BUN 21 (H) 11/07/2013 2128   CREATININE <0.30 (L) 04/26/2020 0425   CREATININE 0.50 (L) 11/07/2013 2128   GLUCOSE 87 04/26/2020 0425   GLUCOSE 107 (H) 11/07/2013 2128   CALCIUM 11.1 (H) 04/26/2020 0425   CALCIUM 9.3 11/07/2013 2128    AST 34 04/06/2020 0924   AST 48 (H) 11/07/2013 2128   ALT 35 04/23/2020 0924   ALT 60 11/07/2013 2128   ALKPHOS 151 (H) 04/08/2020 0924   ALKPHOS 92 11/07/2013 2128   BILITOT 0.9 04/23/2020 0924   BILITOT 0.2 11/07/2013 2128   PROT 7.7 04/28/2020 0924   PROT 7.5 11/07/2013 2128   ALBUMIN 2.7 (L) 04/10/2020 0924   ALBUMIN 3.6 11/07/2013 2128    RADIOGRAPHIC STUDIES: CT Head Wo Contrast  Result Date: 04/06/2020 CLINICAL DATA:  Weakness. EXAM: CT HEAD WITHOUT CONTRAST TECHNIQUE: Contiguous axial images were obtained from the base of the skull through the vertex without intravenous contrast. COMPARISON:  None. FINDINGS: Brain: There is mild cerebral atrophy with widening of the extra-axial spaces and ventricular dilatation. There are areas of decreased attenuation within the white matter tracts of the supratentorial brain, consistent with microvascular disease changes. Vascular: No hyperdense vessel or unexpected calcification. Skull: Normal. Negative for fracture or focal lesion. Sinuses/Orbits: No acute finding. Other: None. IMPRESSION: 1. No acute intracranial abnormality. 2. Mild cerebral atrophy and microvascular disease changes of the supratentorial brain. Electronically Signed   By: Virgina Norfolk M.D.   On: 04/06/2020 01:12   CT CHEST W CONTRAST  Result Date: 04/11/2020 CLINICAL DATA:  Altered mental status and right upper quadrant abdominal pain. History of stage IV non-small cell lung cancer. EXAM: CT CHEST, ABDOMEN, AND PELVIS WITH CONTRAST TECHNIQUE: Multidetector CT imaging of the chest, abdomen and pelvis was performed following the standard protocol during bolus administration of intravenous contrast. CONTRAST:  164mL OMNIPAQUE IOHEXOL 300 MG/ML  SOLN COMPARISON:  01/28/2020 FINDINGS: CT CHEST FINDINGS Cardiovascular: The heart is normal in size. No pericardial effusion. The aorta is normal in caliber. No dissection. Stable minimal calcifications. The branch vessels are patent.  No definite coronary artery calcifications. Mediastinum/Nodes: Slightly progressive soft tissue density noted in the subcarinal region with maximum measurement of 9 mm. This previously measured 6.5 mm. I do not see any other enlarged mediastinal or hilar lymph nodes. The esophagus is grossly normal. Lungs/Pleura: The right lower lobe lesion is grossly stable. It is a little hard to measure because of new eventration/elevation of  the right hemidiaphragm. Right lower lobe and right middle lobe peribronchial thickening and patchy airspace opacity the suggesting bronchopneumonia. A few scattered tiny pulmonary nodules are noted some of which are calcified granulomas. New 5 mm peripheral nodule in the right upper lobe on image number 78/4 could be inflammatory but metastatic disease is not excluded. Subpleural density in the left lower lobe on image 77/4 measures 9 mm and is likely inflammatory or focus of atelectasis. Musculoskeletal: Interval marked enlargement of the thyroid gland multiple large nodules. I am not sure hydro explain this rapid progression. Could not exclude the possibility of metastatic disease although that would be pretty unusual. No axillary or supraclavicular adenopathy. No breast masses. The bony structures are intact. No worrisome bone lesions. CT ABDOMEN PELVIS FINDINGS Hepatobiliary: Interval development of extensive hepatic metastatic disease. I do not see any lesions even in retrospect on the prior study from February 22nd. This would suggest very aggressive metastatic disease. 4.6 cm lesion in segment 4A. Coalescent lesions in the right hepatic lobe in and around the caudate lobe measures a maximum of 7.7 cm. 2.7 cm lesion in segment 6 on image 53/2. 2.5 cm segment 6 lesion on image 59/2. The gallbladder is contracted. No intra or extrahepatic biliary dilatation. Pancreas: No mass, inflammation or ductal dilatation. Spleen: Normal size. No focal lesions. Adrenals/Urinary Tract: Bilateral  adrenal gland metastasis appear relatively stable. The the kidneys are unremarkable and stable. Stable large right renal cyst. The bladder appears normal. Stomach/Bowel: The stomach, duodenum, small bowel and colon are grossly normal. Large amount of stool throughout the colon and down into the rectum suggesting constipation. Vascular/Lymphatic: Stable age advanced atherosclerotic calcifications involving the distal aorta and iliac arteries but no aneurysm or dissection. The branch vessels are patent. The major venous structures are patent. New right-sided retroperitoneal lymph nodes with a intra aortocaval lymph node measuring 9.5 mm on image 62/2. Reproductive: The uterus and ovaries are normal. Other: No pelvic mass or pelvic adenopathy. No free pelvic fluid collections. No inguinal mass or adenopathy. Musculoskeletal: No significant bony findings. IMPRESSION: 1. Stable right lower lobe lung lesion. 2. Right lower lobe and right middle lobe bronchopneumonia. 3. New 5 mm peripheral nodule in the right upper lobe could be inflammatory but metastatic disease is not excluded. Recommend attention on follow-up. 4. Interval development of extensive hepatic metastatic disease. 5. Stable bilateral adrenal gland metastasis. 6. New right-sided retroperitoneal lymph nodes. 7. Interval marked enlargement of the thyroid gland with multiple large nodules. Could not exclude the possibility of metastatic disease although that would be pretty unusual. 8. PET-CT may be helpful for further evaluation, if necessary. 9. Large amount of stool throughout the colon and down into the rectum suggesting constipation. Aortic Atherosclerosis (ICD10-I70.0). Electronically Signed   By: Marijo Sanes M.D.   On: 04/21/2020 11:25   CT ABDOMEN PELVIS W CONTRAST  Result Date: 05/01/2020 CLINICAL DATA:  Altered mental status and right upper quadrant abdominal pain. History of stage IV non-small cell lung cancer. EXAM: CT CHEST, ABDOMEN, AND  PELVIS WITH CONTRAST TECHNIQUE: Multidetector CT imaging of the chest, abdomen and pelvis was performed following the standard protocol during bolus administration of intravenous contrast. CONTRAST:  163mL OMNIPAQUE IOHEXOL 300 MG/ML  SOLN COMPARISON:  01/28/2020 FINDINGS: CT CHEST FINDINGS Cardiovascular: The heart is normal in size. No pericardial effusion. The aorta is normal in caliber. No dissection. Stable minimal calcifications. The branch vessels are patent. No definite coronary artery calcifications. Mediastinum/Nodes: Slightly progressive soft tissue density noted in  the subcarinal region with maximum measurement of 9 mm. This previously measured 6.5 mm. I do not see any other enlarged mediastinal or hilar lymph nodes. The esophagus is grossly normal. Lungs/Pleura: The right lower lobe lesion is grossly stable. It is a little hard to measure because of new eventration/elevation of the right hemidiaphragm. Right lower lobe and right middle lobe peribronchial thickening and patchy airspace opacity the suggesting bronchopneumonia. A few scattered tiny pulmonary nodules are noted some of which are calcified granulomas. New 5 mm peripheral nodule in the right upper lobe on image number 78/4 could be inflammatory but metastatic disease is not excluded. Subpleural density in the left lower lobe on image 77/4 measures 9 mm and is likely inflammatory or focus of atelectasis. Musculoskeletal: Interval marked enlargement of the thyroid gland multiple large nodules. I am not sure hydro explain this rapid progression. Could not exclude the possibility of metastatic disease although that would be pretty unusual. No axillary or supraclavicular adenopathy. No breast masses. The bony structures are intact. No worrisome bone lesions. CT ABDOMEN PELVIS FINDINGS Hepatobiliary: Interval development of extensive hepatic metastatic disease. I do not see any lesions even in retrospect on the prior study from February 22nd. This  would suggest very aggressive metastatic disease. 4.6 cm lesion in segment 4A. Coalescent lesions in the right hepatic lobe in and around the caudate lobe measures a maximum of 7.7 cm. 2.7 cm lesion in segment 6 on image 53/2. 2.5 cm segment 6 lesion on image 59/2. The gallbladder is contracted. No intra or extrahepatic biliary dilatation. Pancreas: No mass, inflammation or ductal dilatation. Spleen: Normal size. No focal lesions. Adrenals/Urinary Tract: Bilateral adrenal gland metastasis appear relatively stable. The the kidneys are unremarkable and stable. Stable large right renal cyst. The bladder appears normal. Stomach/Bowel: The stomach, duodenum, small bowel and colon are grossly normal. Large amount of stool throughout the colon and down into the rectum suggesting constipation. Vascular/Lymphatic: Stable age advanced atherosclerotic calcifications involving the distal aorta and iliac arteries but no aneurysm or dissection. The branch vessels are patent. The major venous structures are patent. New right-sided retroperitoneal lymph nodes with a intra aortocaval lymph node measuring 9.5 mm on image 62/2. Reproductive: The uterus and ovaries are normal. Other: No pelvic mass or pelvic adenopathy. No free pelvic fluid collections. No inguinal mass or adenopathy. Musculoskeletal: No significant bony findings. IMPRESSION: 1. Stable right lower lobe lung lesion. 2. Right lower lobe and right middle lobe bronchopneumonia. 3. New 5 mm peripheral nodule in the right upper lobe could be inflammatory but metastatic disease is not excluded. Recommend attention on follow-up. 4. Interval development of extensive hepatic metastatic disease. 5. Stable bilateral adrenal gland metastasis. 6. New right-sided retroperitoneal lymph nodes. 7. Interval marked enlargement of the thyroid gland with multiple large nodules. Could not exclude the possibility of metastatic disease although that would be pretty unusual. 8. PET-CT may be  helpful for further evaluation, if necessary. 9. Large amount of stool throughout the colon and down into the rectum suggesting constipation. Aortic Atherosclerosis (ICD10-I70.0). Electronically Signed   By: Marijo Sanes M.D.   On: 04/12/2020 11:25   DG Chest Portable 1 View  Result Date: 04/05/2020 CLINICAL DATA:  Weakness. History of lung cancer. Progressive weakness. EXAM: PORTABLE CHEST 1 VIEW COMPARISON:  Radiograph 12/27/2019. CT 01/28/2020 FINDINGS: Tip of the right chest port in the SVC. Mild elevation of right hemidiaphragm with streaky opacity at the right lung base, corresponding to nodule on CT. Minor streaky left lung  base atelectasis. The heart is normal in size. Normal mediastinal contours. No pleural fluid or pneumothorax. No acute osseous abnormalities are seen. IMPRESSION: 1. Mild elevation of the right hemidiaphragm with streaky opacity at the right lung base, corresponding to nodule on CT. 2. Minor streaky left lung base atelectasis. Electronically Signed   By: Keith Rake M.D.   On: 04/05/2020 23:55   US Abdomen Limited RUQ  Result Date: 04/06/2020 CLINICAL DATA:  Right upper quadrant pain and nausea. EXAM: ULTRASOUND ABDOMEN LIMITED RIGHT UPPER QUADRANT COMPARISON:  CT 01/28/2020 FINDINGS: Gallbladder: No gallstones or wall thickening visualized. No sonographic Murphy sign noted by sonographer. Common bile duct: Diameter: 8 mm unchanged. Liver: Mild increased parenchymal echogenicity centrally suggesting steatosis. No focal mass. Portal vein is patent on color Doppler imaging with normal direction of blood flow towards the liver. Other: Incidental right renal cyst unchanged. IMPRESSION: No acute findings. Electronically Signed   By: Marin Olp M.D.   On: 04/06/2020 16:24    PERFORMANCE STATUS (ECOG) : 4 - Bedbound  Review of Systems Unable to complete  Physical Exam General: critically ill appearing Pulmonary: coarse, shallow breathing Extremities: no edema, no joint  deformities Skin: no rashes Neurological: unresponsive  IMPRESSION: Patient transitioned to full comfort care over the weekend. Patient is currently comfortable appearing but patient is receiving frequent dosing of hydromorphone. Family reports that patient is uncomfortable appearing when the hydromorphone wears off. Spoke with son who is in agreement with [place] starting a continuous infusion.   Patient is actively declining. I do not think she would be stable for transfer to the [facility]. I anticipate in-hospital death. I spoke with patient's son who confirmed a desire to keep patient here and comfortable.   PLAN: -Comfort care -D/C non-comfort and oral meds -Start hydromorphone infusion 0.5mg /hr and titrate for comfort  Case and plan discussed with Dr. Arbutus Ped and Dr. Janese Banks  Time Total: 30 minutes  Visit consisted of counseling and education dealing with the complex and emotionally intense issues of symptom management and palliative care in the setting of serious and potentially life-threatening illness.Greater than 50%  of this time was spent counseling and coordinating care related to the above assessment and plan.  Signed by: Altha Harm, PhD, NP-C

## 2020-04-28 NOTE — Progress Notes (Signed)
[facility] Room [facility] Liaison RN note  Notified by Billey Chang, NP with PMT of family interest for [facility] Friday. At that time, patient had not been offered choice by [facility] and Hospice Home did not have a room available to offer.   Met in room this morning with [employer]. Patient has had significant decline over the weekend and at this time we are concerned about transfer to the [facility] and possibility of patient passing during transport. Decision was made to not transfer patient.  Discussed with sister on the phone.  Discussed with Dr. Arbutus Ped outside the room. Anticipated hospital death.  Please call for any hospice related questions or concerns.  Thank you. Margaretmary Eddy, BSN, RN [facility] Liaison [phone]

## 2020-04-30 LAB — CULTURE, BLOOD (ROUTINE X 2)
Culture: NO GROWTH
Culture: NO GROWTH
Special Requests: ADEQUATE

## 2020-05-02 ENCOUNTER — Ambulatory Visit: Payer: Medicare Other

## 2020-05-03 LAB — PTH-RELATED PEPTIDE: PTH-related peptide: <2 pmol/L

## 2020-05-06 NOTE — Progress Notes (Signed)
[place]  [phone] [phone]   Name: Deborah Nguyen Date: 05-10-2020 MRN: [id number]  DOB: July 14, 1961  Patient Care Team: Tracie Harrier, MD as PCP - General (Internal Medicine) Telford Nab, RN as Registered Nurse Sindy Guadeloupe, MD as Consulting Physician (Hematology and Oncology)    REASON FOR CONSULTATION: Deborah Nguyen is a 59 y.o. 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 PMH is also notable for muscular dystrophy followed by the neuromuscular clinic at [facility], anxiety, history of hypothyroidism, recurrent hyperthermia, and history of lower extremity DVT. Patient is status post whole brain radiation and chemotherapy and is most recently treated with pembrolizumab.  Patient was recently hospitalized 04/05/2020 to 04/10/2020 with fever of unknown origin.  She was subsequently discharged to rehab.  Patient was discharged home from rehab earlier this week and presented to the ER on 04/22/2020 with weakness and shortness of breath.  She was found to have HCAP.  CTs of chest, abdomen, and pelvis revealed progressive metastatic disease with significant tumor involvement of the liver and thyroid.  Palliative care was consulted help address goals.    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   Muscular dystrophy ([place])    Muscular dystrophy ([place])     PAST SURGICAL HISTORY:  Past Surgical History:  Procedure Laterality Date   CESAREAN SECTION   11/1985   COLONOSCOPY N/A 10/27/2015   Procedure: COLONOSCOPY;  Surgeon: Manya Silvas, MD;  Location: [facility];  Service: Endoscopy;  Laterality: N/A;   NO Propofol - per office   LITHOTRIPSY     PORTA CATH INSERTION N/A 11/09/2019   Procedure: PORTA CATH INSERTION;  Surgeon: Algernon Huxley, MD;  Location: [facility];  Service: Cardiovascular;  Laterality: N/A;   PORTA CATH INSERTION N/A 01/23/2020   Procedure: PORTA CATH INSERTION;  Surgeon: Algernon Huxley, MD;  Location: [facility];  Service: Cardiovascular;  Laterality: N/A;   PORTA CATH INSERTION N/A 02/01/2020   Procedure: PORTA CATH INSERTION;  Surgeon: Algernon Huxley, MD;  Location: [facility];  Service: Cardiovascular;  Laterality: N/A;   TUBAL LIGATION  03/1986    HEMATOLOGY/ONCOLOGY HISTORY:  Oncology History  Malignant neoplasm of lower lobe of right lung ([place])  09/20/2019 Initial Diagnosis   Malignant neoplasm of lower lobe of right lung ([place])   10/05/2019 Cancer Staging   Staging form: Lung, AJCC 8th Edition - Clinical stage from 10/05/2019: Stage IV (cT2, cN2, cM1) - Signed by Sindy Guadeloupe, MD on 10/08/2019   11/12/2019 -  Chemotherapy   The patient had palonosetron (ALOXI) injection 0.25 mg, 0.25 mg, Intravenous,  Once, 4 of 4 cycles Administration: 0.25 mg (11/12/2019), 0.25 mg (12/24/2019), 0.25 mg (01/14/2020), 0.25 mg (12/03/2019) pegfilgrastim-cbqv (UDENYCA) injection 6 mg, 6 mg, Subcutaneous, Once, 4 of 4 cycles Administration: 6 mg (11/13/2019), 6 mg (12/26/2019), 6 mg (01/15/2020),  6 mg (12/04/2019) CARBOplatin (PARAPLATIN) 500 mg in sodium chloride 0.9 % 250 mL chemo infusion, 500 mg (100 % of original dose 500 mg), Intravenous,  Once, 4 of 4 cycles Dose modification:   (original dose 487.5 mg, Cycle 1), 500 mg (original dose 500 mg, Cycle 1, Reason: Patient Age, Comment: per md) Administration: 500 mg (12/24/2019), 500 mg (01/14/2020), 500 mg (12/03/2019), 500 mg (11/12/2019) PACLitaxel  (TAXOL) 324 mg in sodium chloride 0.9 % 500 mL chemo infusion (> 80mg /m2), 200 mg/m2 = 324 mg, Intravenous,  Once, 4 of 4 cycles Dose modification: 175 mg/m2 (original dose 200 mg/m2, Cycle 3, Reason: Other (see comments), Comment: neuropathy) Administration: 324 mg (11/12/2019), 282 mg (12/24/2019), 282 mg (01/14/2020), 324 mg (12/03/2019) pembrolizumab (KEYTRUDA) 200 mg in sodium chloride 0.9 % 50 mL chemo infusion, 200 mg, Intravenous, Once, 6 of 10 cycles Administration: 200 mg (11/12/2019), 200 mg (12/24/2019), 200 mg (01/14/2020), 200 mg (02/04/2020), 200 mg (02/26/2020), 200 mg (03/18/2020) fosaprepitant (EMEND) 150 mg, dexamethasone (DECADRON) 12 mg in sodium chloride 0.9 % 145 mL IVPB, , Intravenous,  Once, 4 of 4 cycles Administration:  (11/12/2019),  (12/24/2019),  (01/14/2020),  (12/03/2019)  for chemotherapy treatment.      ALLERGIES:  is allergic to morphine and related; paxil [paroxetine hcl]; ciprofloxacin; levaquin [levofloxacin]; propofol; serzone [nefazodone]; biaxin [clarithromycin]; erythromycin; and penicillins.  MEDICATIONS:  Current Facility-Administered Medications  Medication Dose Route Frequency Provider Last Rate Last Admin   acetaminophen (TYLENOL) suppository 325 mg  325 mg Rectal Q4H PRN Nicole Kindred A, DO       atropine 1 % ophthalmic solution 2 drop  2 drop Sublingual QID PRN Nicole Kindred A, DO   2 drop at 04/26/20 1255   bisacodyl (DULCOLAX) suppository 10 mg  10 mg Rectal Daily PRN Nicole Kindred A, DO       glycopyrrolate (ROBINUL) injection 0.2 mg  0.2 mg Intravenous Q2H PRN Sharion Settler, NP   0.2 mg at 2020/05/27 0531   HYDROmorphone (DILAUDID) 50 mg in sodium chloride 0.9 % 100 mL (0.5 mg/mL) infusion  0.5 mg/hr Intravenous Continuous Rendon Howell, Kirt Boys, NP 1 mL/hr at 05-27-2020 0531 0.5 mg/hr at May 27, 2020 0531   HYDROmorphone (DILAUDID) injection 1 mg  1 mg Intravenous Q1H PRN Nicole Kindred A, DO   1 mg at 04/28/20 9518   LORazepam (ATIVAN) injection 1-2 mg   1-2 mg Intravenous Q4H PRN Eyvette Cordon, Kirt Boys, NP       ondansetron (ZOFRAN) injection 4 mg  4 mg Intravenous Q8H PRN Ivor Costa, MD       scopolamine (TRANSDERM-SCOP) 1 MG/3DAYS 1.5 mg  1 patch Transdermal Q72H Nicole Kindred A, DO   1.5 mg at 04/26/20 1255    VITAL SIGNS: BP (!) 75/47 (BP Location: Left Arm)    Pulse (!) 111    Temp (!) 97.3 F (36.3 C) (Oral)    Resp 19    Ht 5\' 1"  (1.549 m)    Wt 134 lb (60.8 kg)    LMP 12/15/2010 (Approximate)    SpO2 (!) 87%    BMI 25.32 kg/m  Filed Weights   04/21/2020 0910  Weight: 134 lb (60.8 kg)    Estimated body mass index is 25.32 kg/m as calculated from the following:   Height as of this encounter: 5\' 1"  (1.549 m).   Weight as of this encounter: 134 lb (60.8 kg).  LABS: CBC:    Component Value Date/Time   WBC 17.2 (H) 04/26/2020 0425   HGB 9.3 (L)  04/26/2020 0425   HGB 13.5 11/07/2013 2128   HCT 30.1 (L) 04/26/2020 0425   HCT 39.2 11/07/2013 2128   PLT 246 04/26/2020 0425   PLT 282 11/07/2013 2128   MCV 96.2 04/26/2020 0425   MCV 96 11/07/2013 2128   NEUTROABS 19.5 (H) 05/05/2020 0924   NEUTROABS 6.8 (H) 05/24/2013 1340   LYMPHSABS 0.6 (L) 04/27/2020 0924   LYMPHSABS 1.6 05/24/2013 1340   MONOABS 0.9 04/09/2020 0924   MONOABS 0.6 05/24/2013 1340   EOSABS 0.1 04/07/2020 0924   EOSABS 0.0 05/24/2013 1340   BASOSABS 0.1 04/05/2020 0924   BASOSABS 0.0 05/24/2013 1340   Comprehensive Metabolic Panel:    Component Value Date/Time   NA 134 (L) 04/26/2020 0425   NA 140 11/07/2013 2128   K 4.7 04/26/2020 0425   K 4.2 11/07/2013 2128   CL 98 04/26/2020 0425   CL 107 11/07/2013 2128   CO2 29 04/26/2020 0425   CO2 29 11/07/2013 2128   BUN 12 04/26/2020 0425   BUN 21 (H) 11/07/2013 2128   CREATININE <0.30 (L) 04/26/2020 0425   CREATININE 0.50 (L) 11/07/2013 2128   GLUCOSE 87 04/26/2020 0425   GLUCOSE 107 (H) 11/07/2013 2128   CALCIUM 11.1 (H) 04/26/2020 0425   CALCIUM 9.3 11/07/2013 2128   AST 34 04/11/2020 0924   AST  48 (H) 11/07/2013 2128   ALT 35 04/07/2020 0924   ALT 60 11/07/2013 2128   ALKPHOS 151 (H) 04/09/2020 0924   ALKPHOS 92 11/07/2013 2128   BILITOT 0.9 05/05/2020 0924   BILITOT 0.2 11/07/2013 2128   PROT 7.7 04/20/2020 0924   PROT 7.5 11/07/2013 2128   ALBUMIN 2.7 (L) 04/12/2020 0924   ALBUMIN 3.6 11/07/2013 2128    RADIOGRAPHIC STUDIES: CT Head Wo Contrast  Result Date: 04/06/2020 CLINICAL DATA:  Weakness. EXAM: CT HEAD WITHOUT CONTRAST TECHNIQUE: Contiguous axial images were obtained from the base of the skull through the vertex without intravenous contrast. COMPARISON:  None. FINDINGS: Brain: There is mild cerebral atrophy with widening of the extra-axial spaces and ventricular dilatation. There are areas of decreased attenuation within the white matter tracts of the supratentorial brain, consistent with microvascular disease changes. Vascular: No hyperdense vessel or unexpected calcification. Skull: Normal. Negative for fracture or focal lesion. Sinuses/Orbits: No acute finding. Other: None. IMPRESSION: 1. No acute intracranial abnormality. 2. Mild cerebral atrophy and microvascular disease changes of the supratentorial brain. Electronically Signed   By: Virgina Norfolk M.D.   On: 04/06/2020 01:12   CT CHEST W CONTRAST  Result Date: 04/18/2020 CLINICAL DATA:  Altered mental status and right upper quadrant abdominal pain. History of stage IV non-small cell lung cancer. EXAM: CT CHEST, ABDOMEN, AND PELVIS WITH CONTRAST TECHNIQUE: Multidetector CT imaging of the chest, abdomen and pelvis was performed following the standard protocol during bolus administration of intravenous contrast. CONTRAST:  158mL OMNIPAQUE IOHEXOL 300 MG/ML  SOLN COMPARISON:  01/28/2020 FINDINGS: CT CHEST FINDINGS Cardiovascular: The heart is normal in size. No pericardial effusion. The aorta is normal in caliber. No dissection. Stable minimal calcifications. The branch vessels are patent. No definite coronary artery  calcifications. Mediastinum/Nodes: Slightly progressive soft tissue density noted in the subcarinal region with maximum measurement of 9 mm. This previously measured 6.5 mm. I do not see any other enlarged mediastinal or hilar lymph nodes. The esophagus is grossly normal. Lungs/Pleura: The right lower lobe lesion is grossly stable. It is a little hard to measure because of new eventration/elevation of the right hemidiaphragm.  Right lower lobe and right middle lobe peribronchial thickening and patchy airspace opacity the suggesting bronchopneumonia. A few scattered tiny pulmonary nodules are noted some of which are calcified granulomas. New 5 mm peripheral nodule in the right upper lobe on image number 78/4 could be inflammatory but metastatic disease is not excluded. Subpleural density in the left lower lobe on image 77/4 measures 9 mm and is likely inflammatory or focus of atelectasis. Musculoskeletal: Interval marked enlargement of the thyroid gland multiple large nodules. I am not sure hydro explain this rapid progression. Could not exclude the possibility of metastatic disease although that would be pretty unusual. No axillary or supraclavicular adenopathy. No breast masses. The bony structures are intact. No worrisome bone lesions. CT ABDOMEN PELVIS FINDINGS Hepatobiliary: Interval development of extensive hepatic metastatic disease. I do not see any lesions even in retrospect on the prior study from February 22nd. This would suggest very aggressive metastatic disease. 4.6 cm lesion in segment 4A. Coalescent lesions in the right hepatic lobe in and around the caudate lobe measures a maximum of 7.7 cm. 2.7 cm lesion in segment 6 on image 53/2. 2.5 cm segment 6 lesion on image 59/2. The gallbladder is contracted. No intra or extrahepatic biliary dilatation. Pancreas: No mass, inflammation or ductal dilatation. Spleen: Normal size. No focal lesions. Adrenals/Urinary Tract: Bilateral adrenal gland metastasis appear  relatively stable. The the kidneys are unremarkable and stable. Stable large right renal cyst. The bladder appears normal. Stomach/Bowel: The stomach, duodenum, small bowel and colon are grossly normal. Large amount of stool throughout the colon and down into the rectum suggesting constipation. Vascular/Lymphatic: Stable age advanced atherosclerotic calcifications involving the distal aorta and iliac arteries but no aneurysm or dissection. The branch vessels are patent. The major venous structures are patent. New right-sided retroperitoneal lymph nodes with a intra aortocaval lymph node measuring 9.5 mm on image 62/2. Reproductive: The uterus and ovaries are normal. Other: No pelvic mass or pelvic adenopathy. No free pelvic fluid collections. No inguinal mass or adenopathy. Musculoskeletal: No significant bony findings. IMPRESSION: 1. Stable right lower lobe lung lesion. 2. Right lower lobe and right middle lobe bronchopneumonia. 3. New 5 mm peripheral nodule in the right upper lobe could be inflammatory but metastatic disease is not excluded. Recommend attention on follow-up. 4. Interval development of extensive hepatic metastatic disease. 5. Stable bilateral adrenal gland metastasis. 6. New right-sided retroperitoneal lymph nodes. 7. Interval marked enlargement of the thyroid gland with multiple large nodules. Could not exclude the possibility of metastatic disease although that would be pretty unusual. 8. PET-CT may be helpful for further evaluation, if necessary. 9. Large amount of stool throughout the colon and down into the rectum suggesting constipation. Aortic Atherosclerosis (ICD10-I70.0). Electronically Signed   By: Marijo Sanes M.D.   On: 04/07/2020 11:25   CT ABDOMEN PELVIS W CONTRAST  Result Date: 04/07/2020 CLINICAL DATA:  Altered mental status and right upper quadrant abdominal pain. History of stage IV non-small cell lung cancer. EXAM: CT CHEST, ABDOMEN, AND PELVIS WITH CONTRAST TECHNIQUE:  Multidetector CT imaging of the chest, abdomen and pelvis was performed following the standard protocol during bolus administration of intravenous contrast. CONTRAST:  139mL OMNIPAQUE IOHEXOL 300 MG/ML  SOLN COMPARISON:  01/28/2020 FINDINGS: CT CHEST FINDINGS Cardiovascular: The heart is normal in size. No pericardial effusion. The aorta is normal in caliber. No dissection. Stable minimal calcifications. The branch vessels are patent. No definite coronary artery calcifications. Mediastinum/Nodes: Slightly progressive soft tissue density noted in the subcarinal region  with maximum measurement of 9 mm. This previously measured 6.5 mm. I do not see any other enlarged mediastinal or hilar lymph nodes. The esophagus is grossly normal. Lungs/Pleura: The right lower lobe lesion is grossly stable. It is a little hard to measure because of new eventration/elevation of the right hemidiaphragm. Right lower lobe and right middle lobe peribronchial thickening and patchy airspace opacity the suggesting bronchopneumonia. A few scattered tiny pulmonary nodules are noted some of which are calcified granulomas. New 5 mm peripheral nodule in the right upper lobe on image number 78/4 could be inflammatory but metastatic disease is not excluded. Subpleural density in the left lower lobe on image 77/4 measures 9 mm and is likely inflammatory or focus of atelectasis. Musculoskeletal: Interval marked enlargement of the thyroid gland multiple large nodules. I am not sure hydro explain this rapid progression. Could not exclude the possibility of metastatic disease although that would be pretty unusual. No axillary or supraclavicular adenopathy. No breast masses. The bony structures are intact. No worrisome bone lesions. CT ABDOMEN PELVIS FINDINGS Hepatobiliary: Interval development of extensive hepatic metastatic disease. I do not see any lesions even in retrospect on the prior study from February 22nd. This would suggest very aggressive  metastatic disease. 4.6 cm lesion in segment 4A. Coalescent lesions in the right hepatic lobe in and around the caudate lobe measures a maximum of 7.7 cm. 2.7 cm lesion in segment 6 on image 53/2. 2.5 cm segment 6 lesion on image 59/2. The gallbladder is contracted. No intra or extrahepatic biliary dilatation. Pancreas: No mass, inflammation or ductal dilatation. Spleen: Normal size. No focal lesions. Adrenals/Urinary Tract: Bilateral adrenal gland metastasis appear relatively stable. The the kidneys are unremarkable and stable. Stable large right renal cyst. The bladder appears normal. Stomach/Bowel: The stomach, duodenum, small bowel and colon are grossly normal. Large amount of stool throughout the colon and down into the rectum suggesting constipation. Vascular/Lymphatic: Stable age advanced atherosclerotic calcifications involving the distal aorta and iliac arteries but no aneurysm or dissection. The branch vessels are patent. The major venous structures are patent. New right-sided retroperitoneal lymph nodes with a intra aortocaval lymph node measuring 9.5 mm on image 62/2. Reproductive: The uterus and ovaries are normal. Other: No pelvic mass or pelvic adenopathy. No free pelvic fluid collections. No inguinal mass or adenopathy. Musculoskeletal: No significant bony findings. IMPRESSION: 1. Stable right lower lobe lung lesion. 2. Right lower lobe and right middle lobe bronchopneumonia. 3. New 5 mm peripheral nodule in the right upper lobe could be inflammatory but metastatic disease is not excluded. Recommend attention on follow-up. 4. Interval development of extensive hepatic metastatic disease. 5. Stable bilateral adrenal gland metastasis. 6. New right-sided retroperitoneal lymph nodes. 7. Interval marked enlargement of the thyroid gland with multiple large nodules. Could not exclude the possibility of metastatic disease although that would be pretty unusual. 8. PET-CT may be helpful for further evaluation,  if necessary. 9. Large amount of stool throughout the colon and down into the rectum suggesting constipation. Aortic Atherosclerosis (ICD10-I70.0). Electronically Signed   By: Marijo Sanes M.D.   On: 04/27/2020 11:25   DG Chest Portable 1 View  Result Date: 04/05/2020 CLINICAL DATA:  Weakness. History of lung cancer. Progressive weakness. EXAM: PORTABLE CHEST 1 VIEW COMPARISON:  Radiograph 12/27/2019. CT 01/28/2020 FINDINGS: Tip of the right chest port in the SVC. Mild elevation of right hemidiaphragm with streaky opacity at the right lung base, corresponding to nodule on CT. Minor streaky left lung base atelectasis. The  heart is normal in size. Normal mediastinal contours. No pleural fluid or pneumothorax. No acute osseous abnormalities are seen. IMPRESSION: 1. Mild elevation of the right hemidiaphragm with streaky opacity at the right lung base, corresponding to nodule on CT. 2. Minor streaky left lung base atelectasis. Electronically Signed   By: Keith Rake M.D.   On: 04/05/2020 23:55   US Abdomen Limited RUQ  Result Date: 04/06/2020 CLINICAL DATA:  Right upper quadrant pain and nausea. EXAM: ULTRASOUND ABDOMEN LIMITED RIGHT UPPER QUADRANT COMPARISON:  CT 01/28/2020 FINDINGS: Gallbladder: No gallstones or wall thickening visualized. No sonographic Murphy sign noted by sonographer. Common bile duct: Diameter: 8 mm unchanged. Liver: Mild increased parenchymal echogenicity centrally suggesting steatosis. No focal mass. Portal vein is patent on color Doppler imaging with normal direction of blood flow towards the liver. Other: Incidental right renal cyst unchanged. IMPRESSION: No acute findings. Electronically Signed   By: Marin Olp M.D.   On: 04/06/2020 16:24    PERFORMANCE STATUS (ECOG) : 4 - Bedbound  Review of Systems Unable to complete  Physical Exam General: critically ill appearing Pulmonary: shallow breathing Extremities: no edema, no joint deformities Skin: no  rashes Neurological: unresponsive  IMPRESSION: Patient remains on hydromorphone drip.  She is comfortable appearing.  Breathing is very shallow and patient is active in the terminal process.  Family at bedside.  Emotional support provided.  Discussed options for grief counseling with patient's sister.  PLAN: -Continue comfort care -Anticipate in-hospital death  Time Total: 30 minutes  Visit consisted of counseling and education dealing with the complex and emotionally intense issues of symptom management and palliative care in the setting of serious and potentially life-threatening illness.Greater than 50%  of this time was spent counseling and coordinating care related to the above assessment and plan.  Signed by: Altha Harm, PhD, NP-C

## 2020-05-06 NOTE — Progress Notes (Signed)
PROGRESS NOTE    Deborah Nguyen   DGL:[id number]  DOB: 1961-12-04  PCP: Tracie Harrier, MD    DOA: 04/15/2020 LOS: 4   Brief Narrative   Deborah Nguyen is a 59 y.o. 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  In the ED, low grade fever 99.9 F, borderline BP 94/66, hypoxic with O2 sat 85% on room air which resolved after short use of nonrebreather mask, later 97% on room air.  Labs notable for leukocytosis 21.3k, INR 1.5, lactic acid 1.3, negative COVID-19, negative UA.   CT's of chest/abdomen/pelvis were obtained, showed right middle lobe bronchopneumonia and several new metastatic lesions in the liver, right upper lobe, bilateral adrenals, thyroid and new retroperitoneal lymphadenopathy. Admitted to hospitalist service with oncology consulted.    Palliative care met with patient and family upon admission, and plan is for patient to go to hospice house once a bed is available.  In the meantime, comfort care meaures have been initiated in the hospital after speak with patient's son who decided against continuing antibiotics for pneumonia pending hospice bed, as patient appears to be declining quite rapidly.    5/24: anticipate in hospital death, patient actively in dying process.  Dilaudid infusion started this AM.  Several family members at bedside.    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 Expect imminent passing.  Patient is still on oxygen which is prolonging the dying process, but patient is comfortable. --address supplemental oxygen use with family --comfort care per orders --notify attending or hospice if any signs of discomfort/pain/distress   Problems this admission no longer active:  Sepsis due to HCAP - present on admission with leukocytosis, tachycardia, and RML pneumonia.  Normal lactic acid.  Antibiotics stopped per family request to pursue full comfort measures  Metastatic squamous cell carcinoma of the right lung - now with widespread, progressive development of new metastatic lesions as outlined above.   --oncology and palliative care following --PO meds for chronic conditions stopped as patient now aspirating with swallow  Port-A-Cath in place - no acute issues. Monitor.   Hypercalcemia RUQ pain Depression with anxiety  GERD (gastroesophageal reflux disease) History of DVT of lower extremity  Subclinical hyperthyroidism Generalized weakness - due to malignancy and chemotherapy   DVT prophylaxis: none (comfort care status)  Diet:  Diet Orders (From admission, onward)    Start     Ordered   05/02/2020 1334  Diet regular Room service appropriate? Yes; Fluid consistency: Thin  Diet effective now    Question Answer Comment  Room service appropriate? Yes   Fluid consistency: Thin      04/24/2020 1334            Code Status: DNR  Subjective 2020/05/11    Patient seen with best friend at bedside.  Sister had gone home earlier this AM to rest.  Friend says patient has been sleeping comfortably and no signs of distress.  We discussed patient being on supplemental oxygen is prolonging the dying process.  Will discuss with son as well.  Disposition Plan &  Communication   Status is: Inpatient  Remains inpatient appropriate because: she is full comfort care, actively dying.  Anticipate in hospital death.   Dispo: The patient is from: Home              Anticipated d/c is to: funeral home              Anticipated d/c date is: hours to 1 day              Patient currently is not medically stable to d/c.   Family Communication: several family members including sister and niece were at bedside during encounter.    Consults, Procedures, Significant Events   Consultants:   Oncology  Palliative Care  Hospice  Procedures:   none  Antimicrobials:   Vanc & Cefepime 5/21 >> 5/22     Objective   Vitals:   04/28/20 2225 05/11/20 0230 2020-05-11 0400 May 11, 2020 0523  BP: (!) 84/52   (!) 75/47  Pulse: (!) 123   (!) 111  Resp: (!) '22 18 19 19  ' Temp: 98.6 F (37 C)   (!) 97.3 F (36.3 C)  TempSrc: Oral   Oral  SpO2: (!) 87%   (!) 87%  Weight:      Height:        Intake/Output Summary (Last 24 hours) at 05-11-2020 1200 Last data filed at May 11, 2020 0531 Gross per 24 hour  Intake 258 ml  Output --  Net 258 ml   Filed Weights   04/22/2020 0910  Weight: 60.8 kg    Physical Exam:  General exam: sleeping, no acute distress Respiratory system: shallow respirations, diffuse rhonchi, no accessory muscle use Cardiovascular system: normal S1/S2, RRR, no pedal edema Skin: dry, intact, distal extremities with normal color and temp  Labs   Data Reviewed: I have personally reviewed following labs and imaging studies  CBC: Recent Labs  Lab 04/20/2020 0924 04/26/20 0425  WBC 21.3* 17.2*  NEUTROABS 19.5*  --   HGB 11.4* 9.3*  HCT 35.7* 30.1*  MCV 95.2 96.2  PLT 279 859   Basic Metabolic Panel: Recent Labs  Lab 04/16/2020 0924 04/26/20 0425  NA 135 134*  K 5.0 4.7  CL 97* 98  CO2 28 29  GLUCOSE 112* 87  BUN 25* 12  CREATININE 0.37* <0.30*  CALCIUM 11.0* 11.1*   GFR: CrCl cannot be calculated (This lab value cannot be  used to calculate CrCl because it is not a number: <0.30). Liver Function Tests: Recent Labs  Lab 04/20/2020 0924  AST 34  ALT 35  ALKPHOS 151*  BILITOT 0.9  PROT 7.7  ALBUMIN 2.7*   No results for input(s): LIPASE, AMYLASE in the last 168 hours. No results for input(s): AMMONIA in the last 168 hours. Coagulation Profile: Recent Labs  Lab 04/30/2020 0924  INR 1.5*   Cardiac Enzymes: No results for input(s): CKTOTAL, CKMB, CKMBINDEX, TROPONINI in the last 168 hours. BNP (last 3 results) No results for input(s): PROBNP in the last 8760 hours. HbA1C: No results for input(s): HGBA1C in the last 72 hours. CBG: No results for input(s): GLUCAP in the last 168 hours. Lipid  Profile: No results for input(s): CHOL, HDL, LDLCALC, TRIG, CHOLHDL, LDLDIRECT in the last 72 hours. Thyroid Function Tests: No results for input(s): TSH, T4TOTAL, FREET4, T3FREE, THYROIDAB in the last 72 hours. Anemia Panel: No results for input(s): VITAMINB12, FOLATE, FERRITIN, TIBC, IRON, RETICCTPCT in the last 72 hours. Sepsis Labs: Recent Labs  Lab 04/14/2020 0913 04/21/2020 0924  PROCALCITON  --  0.58  LATICACIDVEN 1.3  --     Recent Results (from the past 240 hour(s))  Culture, blood (Routine x 2)     Status: None (Preliminary result)   Collection Time: 04/20/2020  9:24 AM   Specimen: BLOOD  Result Value Ref Range Status   Specimen Description BLOOD LAC  Final   Special Requests   Final    BOTTLES DRAWN AEROBIC AND ANAEROBIC Blood Culture adequate volume   Culture   Final    NO GROWTH 4 DAYS Performed at [facility], [address] [place], [place] [address]    Report Status PENDING  Incomplete  Urine culture     Status: None   Collection Time: 04/22/2020  9:24 AM   Specimen: In/Out Cath Urine  Result Value Ref Range Status   Specimen Description   Final    IN/OUT CATH URINE Performed at [facility], [address] [place], [place] [address]    Special Requests   Final     NONE Performed at [facility], [address], [place], [place] [address]    Culture   Final    NO GROWTH Performed at [facility], [place] [address], [place], [place] [address]    Report Status 04/26/2020 FINAL  Final  Culture, blood (Routine x 2)     Status: None (Preliminary result)   Collection Time: 04/08/2020  9:47 AM   Specimen: BLOOD  Result Value Ref Range Status   Specimen Description BLOOD RIGHT AC  Final   Special Requests   Final    BOTTLES DRAWN AEROBIC AND ANAEROBIC Blood Culture results may not be optimal due to an excessive volume of blood received in culture bottles   Culture   Final    NO GROWTH 4 DAYS Performed at [facility], [address] [place], [place] [address]    Report Status PENDING  Incomplete  SARS Coronavirus 2 by RT PCR (hospital order, performed in [place] hospital lab) Nasopharyngeal Nasopharyngeal Swab     Status: None   Collection Time: 04/22/2020 12:09 PM   Specimen: Nasopharyngeal Swab  Result Value Ref Range Status   SARS Coronavirus 2 NEGATIVE NEGATIVE Final    Comment: (NOTE) SARS-CoV-2 target nucleic acids are NOT DETECTED. The SARS-CoV-2 RNA is generally detectable in upper and lower respiratory specimens during the acute phase of infection. The lowest concentration of SARS-CoV-2 viral copies this assay can detect is 250 copies / mL. A negative result does not preclude SARS-CoV-2 infection and should not be used as the sole basis for treatment or other patient management decisions.  A negative result may occur with improper specimen collection / handling, submission of specimen other than nasopharyngeal swab, presence of viral mutation(s) within the areas targeted by this assay, and inadequate number of viral copies (<250 copies / mL). A negative result must be combined with clinical observations, patient history, and epidemiological information. Fact Sheet for Patients:     StrictlyIdeas.no Fact Sheet for Healthcare Providers: BankingDealers.co.za This test is not yet approved or cleared  by the [place] FDA and has been  authorized for detection and/or diagnosis of SARS-CoV-2 by FDA under an Emergency Use Authorization (EUA).  This EUA will remain in effect (meaning this test can be used) for the duration of the COVID-19 declaration under Section 564(b)(1) of the Act, 21 U.S.C. section 360bbb-3(b)(1), unless the authorization is terminated or revoked sooner. Performed at [facility], [address] [place], [place] [address]       Imaging Studies   No results found.   Medications   Scheduled Meds: . scopolamine  1 patch Transdermal Q72H   Continuous Infusions: . HYDROmorphone 0.5 mg/hr (19-May-2020 0531)       LOS: 4 days    Time spent: 25 minutes    Ezekiel Slocumb, DO [facility]  2020/05/19, 12:00 PM    If 7PM-7AM, please contact night-coverage. How to contact the [facility] Attending or Consulting provider [place] or covering provider during after hours [place], for this patient?    1. Check the care team in [facility] and look for a) attending/consulting [facility] provider listed and b) the [facility] team listed 2. Log into [email or web address] and use [place]'s universal password to access. If you do not have the password, please contact the hospital operator. 3. Locate the [facility] provider you are looking for under [facility] and page to a number that you can be directly reached. 4. If you still have difficulty reaching the provider, please page the [employer] (Director on Call) for the Hospitalists listed on amion for assistance.

## 2020-05-06 NOTE — Progress Notes (Signed)
Cross Cover Brief Note Patient passed away with family by her side.  Death pronounced by 2 RN's. Death certificate completed

## 2020-05-06 NOTE — Progress Notes (Signed)
   05-16-2020 2100  Clinical Encounter Type  Visited With Patient;Family  Visit Type Follow-up  Referral From Nurse  Spiritual Encounters  Spiritual Needs Grief support;Emotional;Prayer   Chaplain offered supportive presence especially to twin sister [name] who was deeply grieving her sister's death. [name] was supported by her son, nephew and cousin. Family is grieving well and able to provide funeral home ([employer] [facility]). Chaplain provided Delayza's daughter-in-law with grief support resources for Mirian's grandchildren. Chaplain offered prayer with family.

## 2020-05-06 NOTE — Progress Notes (Signed)
Patient lying in bed, eyes closed, does not appear to be in any acute distress. Sister and cousin at bedside. Deborah Nguyen

## 2020-05-06 NOTE — Progress Notes (Signed)
Patient's family came out to the desk @ 1930 to ask if someone could come in and check on patient. Patient had deceased. Myself and Charge Nurse  Gillermina Hu  Pronounced the death at [place].  Sharion Settler, NP notified. Foley removed and peripheral IV removed, both intact. Chaplain notified and paperwork for funeral home completed. Pt was prepared for morgue.

## 2020-05-06 NOTE — Progress Notes (Signed)
[place] visited pt.'s rm. as follow-up to visit on Saturday; Pt. in bed breathing shallowly,lying down and apparently asleep.  Pt.'s friend @ bedside in chair, twin sister Deborah Nguyen @ bedside as well.  Ch talked briefly w/friend and sister --> friend answered questions for sister which made it difficult to assess sister's coping.  Sister shared she tried to leave hospital and get some sleep but was not able to get any rest and returned soon afterward.  Dr. Laurann [place] had come while sister was gone and sister asked [place] to request Dr. Laurann [place] return.  [place] spoke breifly w/Dr. Laurann [place]: learned pt.'s O2 sats dropping --> decision needs to be made by family re: whether to increase O2 supply. Approx. 68min later RN contacted [facility] and said pt.'s O2 had been discontinued and sister requests prayer.  [place] found sister sitting on bed, holding pt.'s hand to her chest, singing softly and crying.  After a few minutes, [place] asked to pray for pt. and sister, sat next to sister on bed and prayed for supernatural courage, strength, and peace for both.  [place] excused himself to allow sisters to have time alone together.  CH remains available as needed and has briefed the evening chaplain on situation incase support is needed.

## 2020-05-06 NOTE — Progress Notes (Signed)
RN and NT turned patient and repositioned for comfort. Patient unresponsive during interaction, continues to shallow breath BP down to 74/46. [place] given as ordered, Sister and cousin remain at bedside. Patient's sister is tearful , Chaplain paged per family request. Deborah Nguyen

## 2020-05-06 DEATH — deceased

## 2020-05-08 DIAGNOSIS — J9621 Acute and chronic respiratory failure with hypoxia: Secondary | ICD-10-CM | POA: Diagnosis present

## 2020-06-05 NOTE — Death Summary Note (Signed)
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 Acute Respiratory failure with hypoxia secondary to HCAP  2. 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  In the ED, low grade fever 99.9 F, borderline BP 94/66, hypoxic with O2 sat 85% on room air which resolved after short use of nonrebreather mask, later 97% on room air.  Labs notable for leukocytosis 21.3k, INR 1.5, lactic acid 1.3, negative COVID-19, negative UA.   CT's of chest/abdomen/pelvis were obtained, showed right middle lobe bronchopneumonia and several new metastatic lesions in the liver, right upper lobe, bilateral adrenals, thyroid and new retroperitoneal lymphadenopathy.  Admitted to hospitalist service with oncology consulted.    Palliative care met with patient and family upon admission, and plan was for patient to go to hospice house once a bed  available.  In the meantime, comfort care meaures were initiated in the hospital after speaking with patient's son who decided against continuing antibiotics for pneumonia pending hospice bed, as patient appears to be declining quite rapidly.    5/24: anticipate in hospital death, patient actively in dying process.  Dilaudid infusion started this AM.  Several family members at bedside.  Patient passed away with family at bedside.   Time: 1930  Signed:  Ezekiel Slocumb, DO  [facility] 06/01/2020, 7:27 AM

## 2021-07-25 IMAGING — CT CT ABD-PELV W/ CM
2 of 5 series · 15 of 46 positions shown, 17 images · IV contrast (APPLIED)
Comparison: CT abdomen pelvis dated November 08, 2013.

CLINICAL DATA: Right lower quadrant pain for the past 2 weeks.

EXAM:
CT ABDOMEN AND PELVIS WITH CONTRAST
TECHNIQUE: Multidetector CT imaging of the abdomen and pelvis was performed
using the standard protocol following bolus administration of
intravenous contrast.
CONTRAST:  80mL OMNIPAQUE IOHEXOL 300 MG/ML  SOLN

[Series 2: routine abd/pel with · axial · 0.68mm/px · z∈[-418,-28]mm · 12 of 88 slices shown, 14 images]
[im 5/88  soft-tissue]
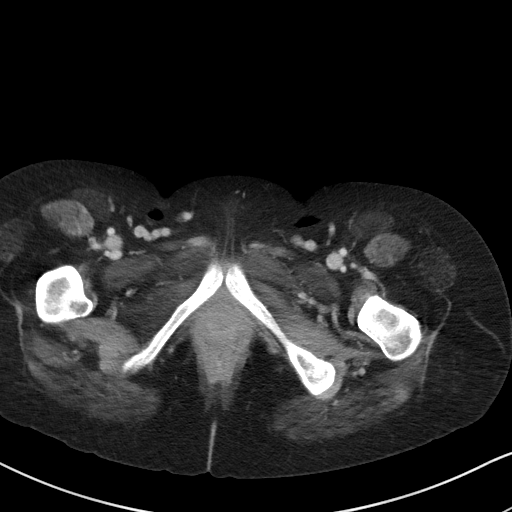
[im 5/88  bone]
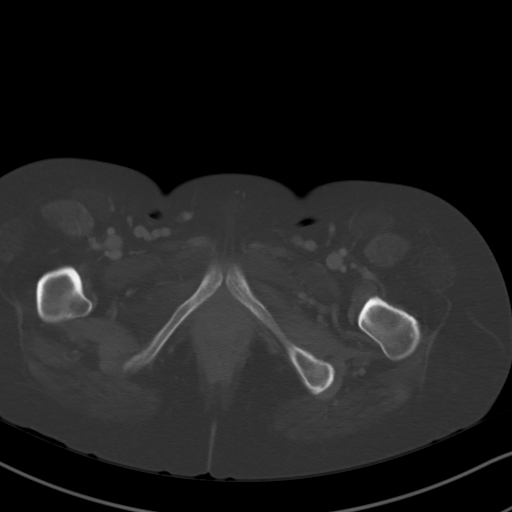
[im 15/88  soft-tissue]
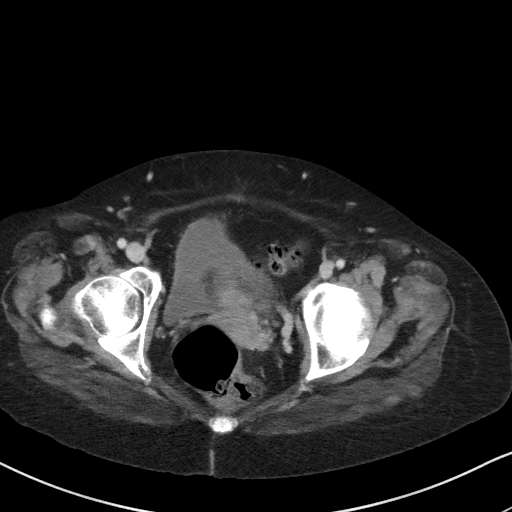
[im 20/88  soft-tissue]
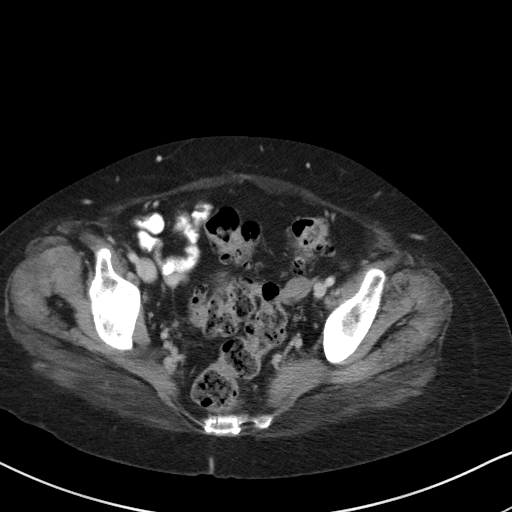
[im 25/88  soft-tissue]
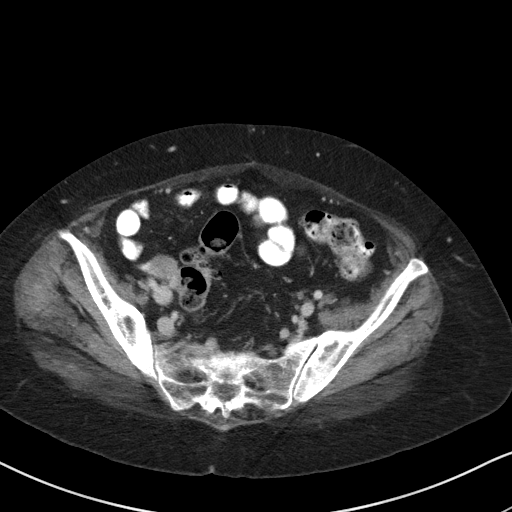
[im 34/88  soft-tissue]
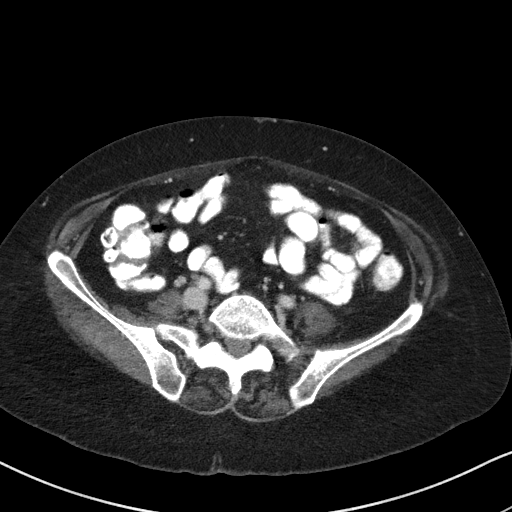
[im 39/88  soft-tissue]
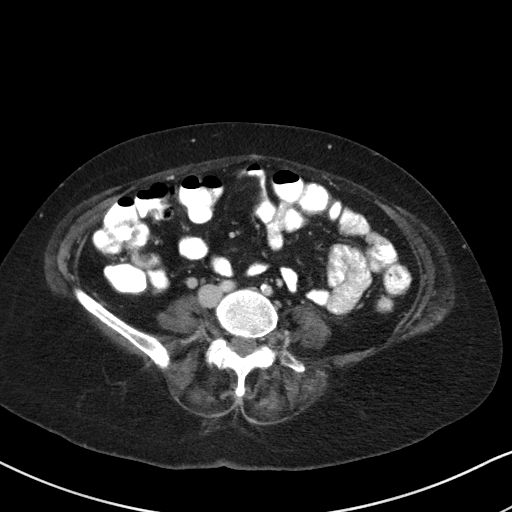
[im 49/88  soft-tissue]
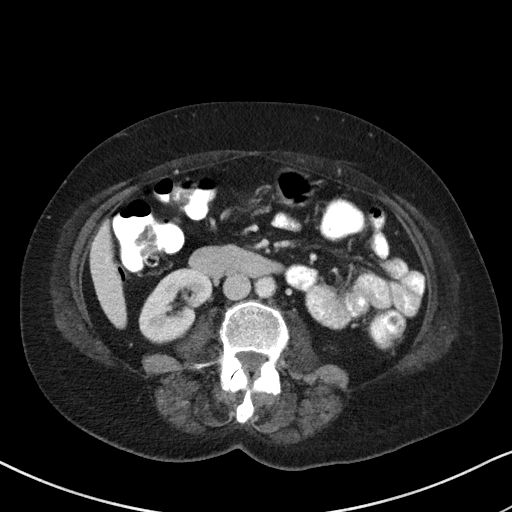
[im 54/88  soft-tissue]
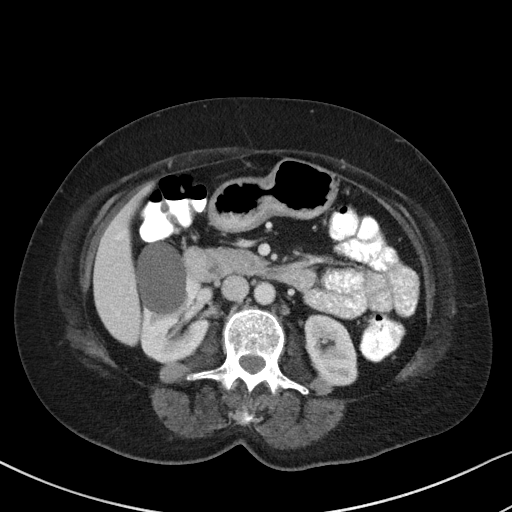
[im 63/88  soft-tissue]
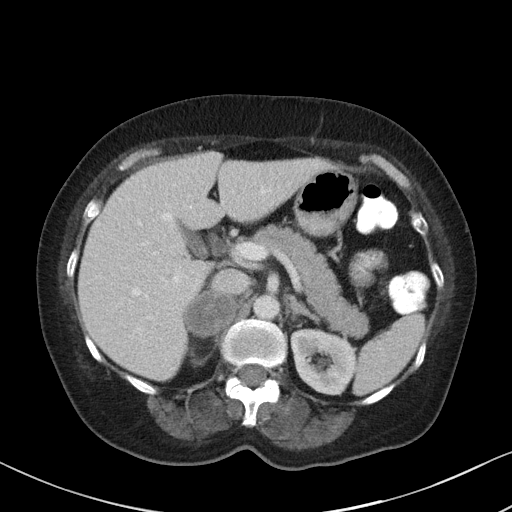
[im 63/88  bone]
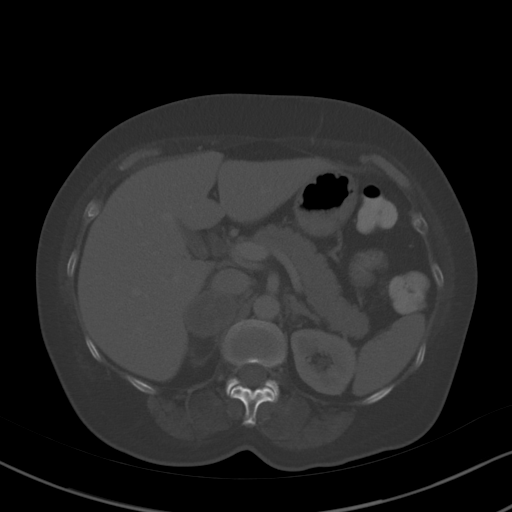
[im 68/88  soft-tissue]
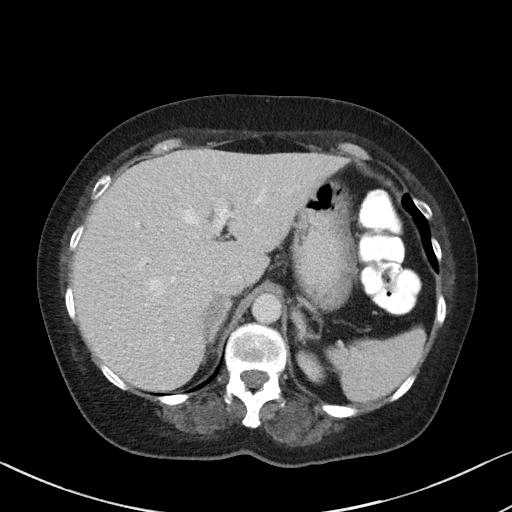
[im 73/88  soft-tissue]
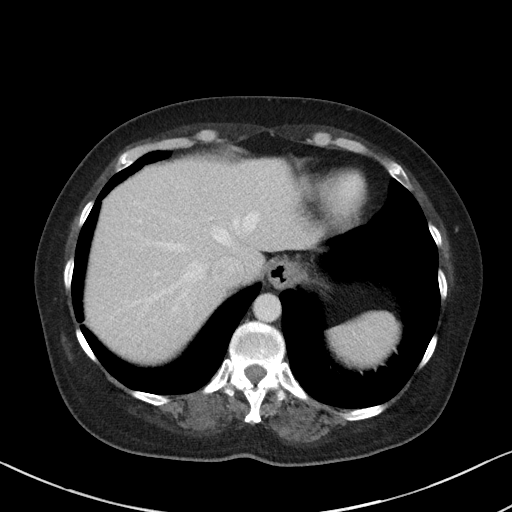
[im 83/88  soft-tissue]
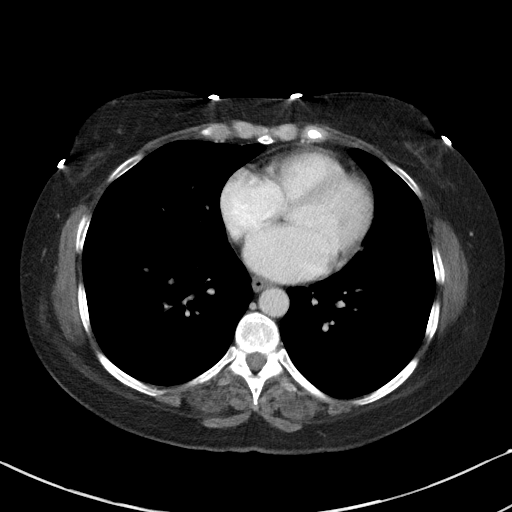

[Series 5: coronal st · coronal · 0.67mm/px · 3 of 80 slices shown]
[im 27/80  soft-tissue]
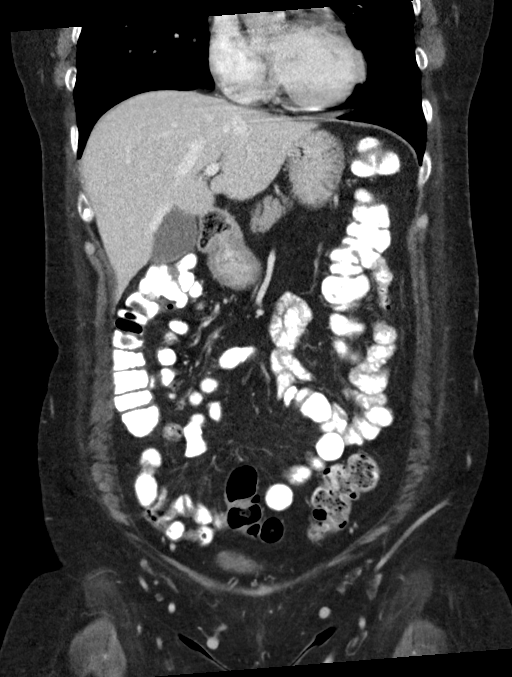
[im 36/80  soft-tissue]
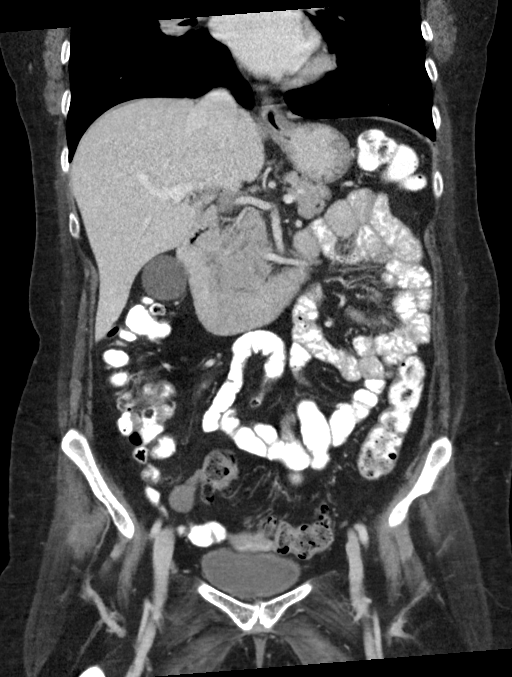
[im 44/80  soft-tissue]
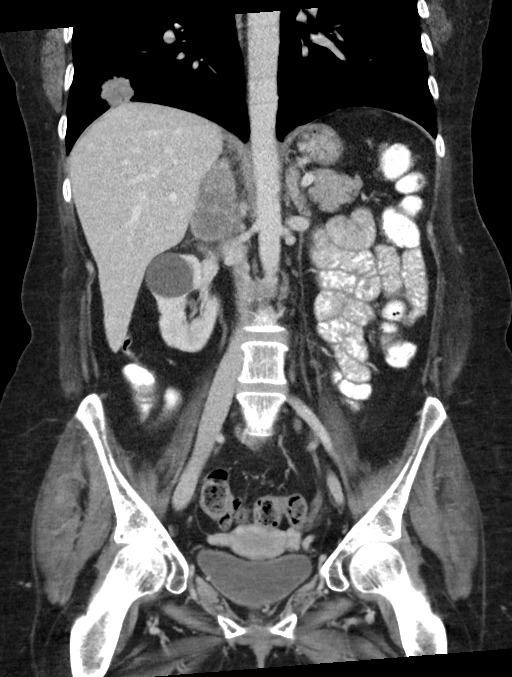

[15 of 46 positions shown; findings below may reference images not displayed]

FINDINGS: Lower chest: New 3.6 x 3.2 cm spiculated mass in the right lower
lobe with tethering of the adjacent pleura. Prominent right hilar
lymph node measuring 1.0 cm in short axis.

Hepatobiliary: No focal liver abnormality is seen. No gallstones,
gallbladder wall thickening, or biliary dilatation.

Pancreas: Unremarkable. No pancreatic ductal dilatation or
surrounding inflammatory changes.

Spleen: Normal in size without focal abnormality.

Adrenals/Urinary Tract: New 3.7 x 2.8 x 5.0 cm hypodense right
adrenal mass. New 1.7 x 1.2 x 2.1 cm hypodense left adrenal nodule.
4.8 cm right renal simple cyst, increased in size since the prior
study. No renal calculi or hydronephrosis. The bladder is
unremarkable.

Stomach/Bowel: Stomach is within normal limits. Appendix appears
normal. No evidence of bowel wall thickening, distention, or
inflammatory changes.

Vascular/Lymphatic: No significant vascular findings are present. No
enlarged abdominal or pelvic lymph nodes.

Reproductive: Uterus and bilateral adnexa are unremarkable.

Other: No abdominal wall hernia or abnormality. No abdominopelvic
ascites. No pneumoperitoneum.

Musculoskeletal: No acute or significant osseous findings. Mild
diffuse muscle atrophy. Chronic mild T12 superior endplate
compression deformity.
IMPRESSION: 1.  No acute intra-abdominal process.
2. New 3.6 x 3.2 cm spiculated mass in the right lower lobe with
tethering of the adjacent pleura, highly concerning for primary
bronchogenic carcinoma. Consultation with pulmonary medicine or
thoracic surgery is suggested, as clinically appropriate.
3. New bilateral adrenal masses measuring up to 5.0 cm on the right,
consistent with metastatic disease.

These results will be called to the ordering clinician or
representative by the Radiologist Assistant, and communication
documented in the PACS or zVision Dashboard.

## 2021-08-14 IMAGING — CR DG CHEST 2V
2 series · 2 of 2 positions shown · non-contrast
Comparison: CT 09/13/2019

CLINICAL DATA: Fever of unknown origin, history of lung mass

EXAM:
CHEST - 2 VIEW

[chest pa]
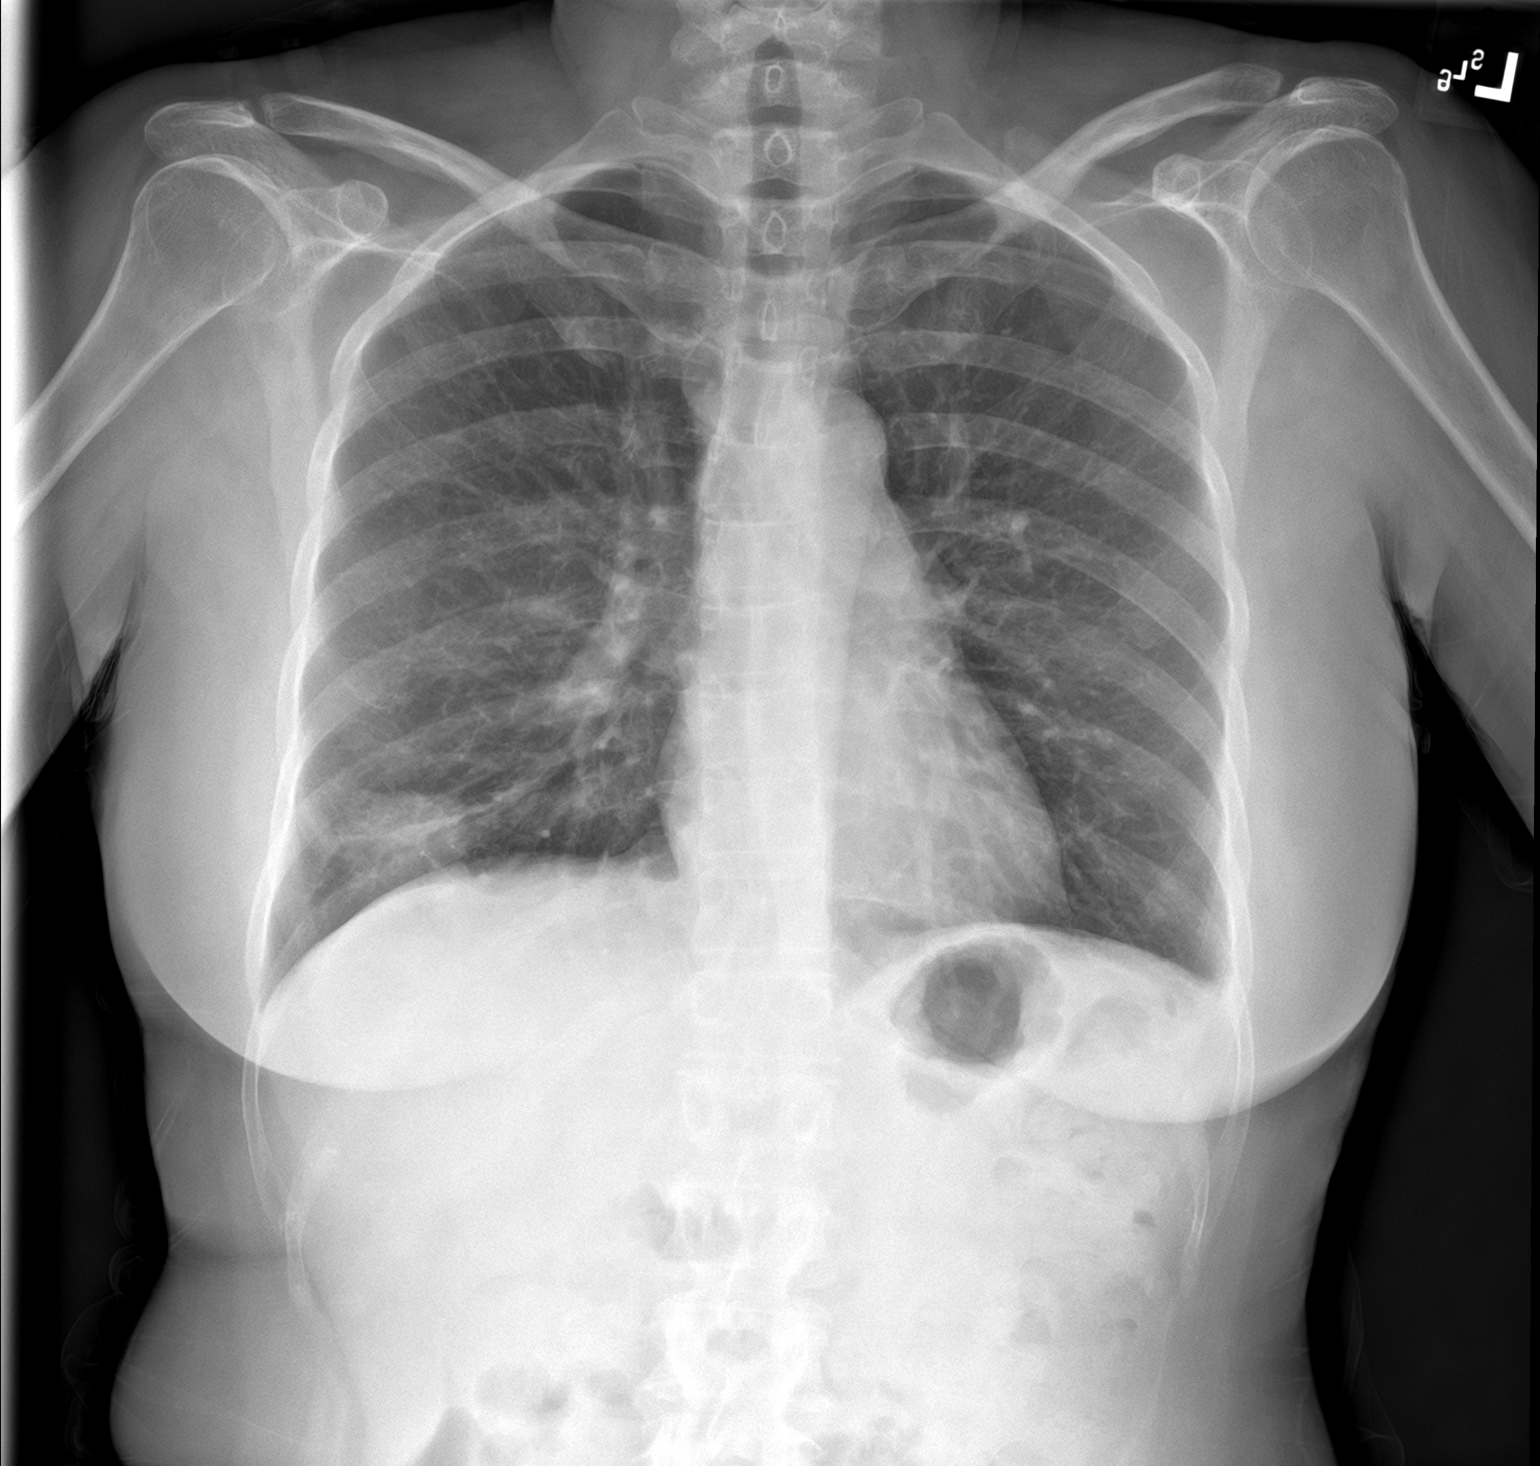

[chest lat]
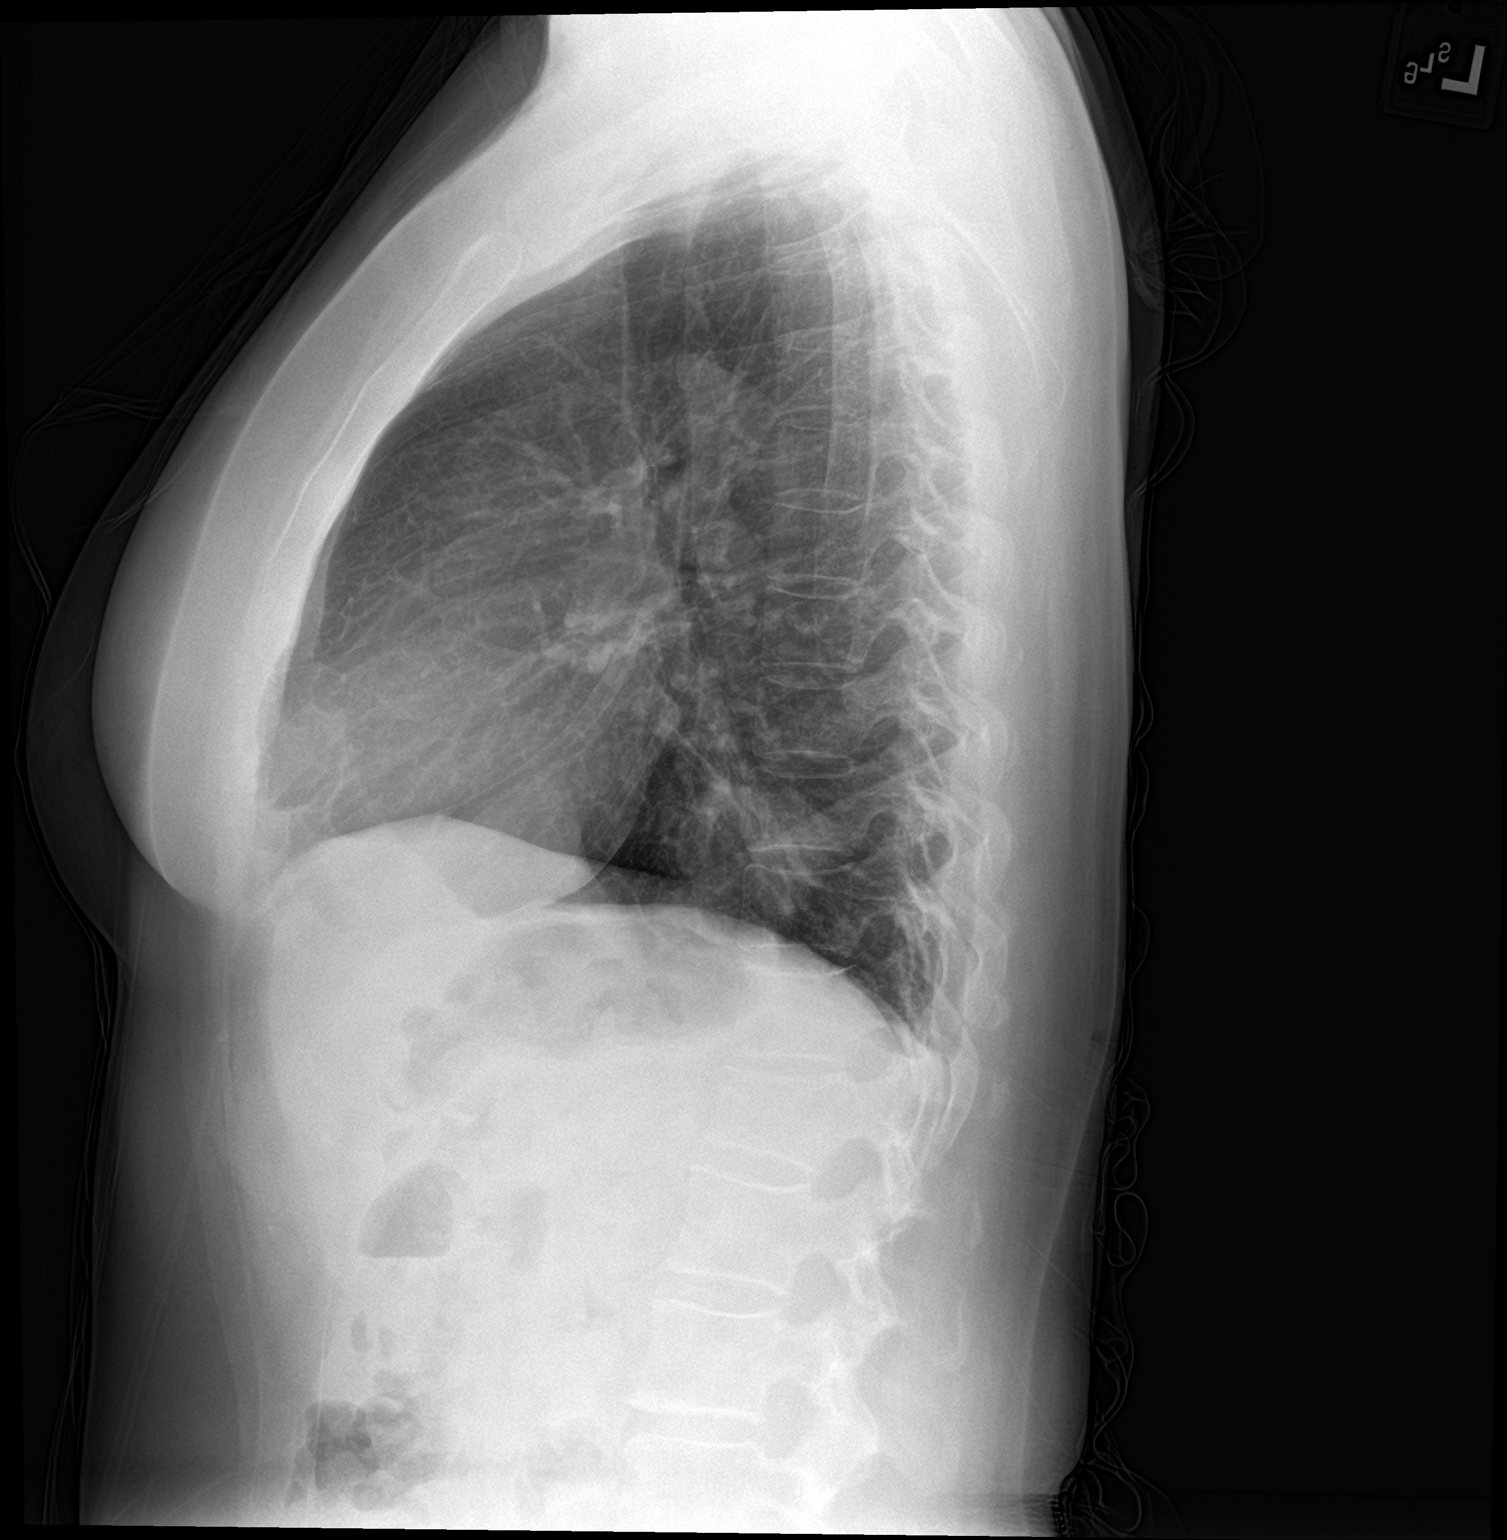

[2 of 2 positions shown; findings below may reference images not displayed]

FINDINGS: Right lower lobe lung mass. Normal heart size. No pneumothorax. Mild
superior endplate deformity at T12 again noted.
IMPRESSION: Right lower lobe lung mass.  Negative for acute pulmonary opacity.

## 2022-07-12 NOTE — Progress Notes (Signed)
error
